# Patient Record
Sex: Male | Born: 1935
Health system: Southern US, Community
[De-identification: ages and names within clinical notes are randomized; demographics above are authoritative.]

## PROBLEM LIST (undated history)

## (undated) DIAGNOSIS — I340 Nonrheumatic mitral (valve) insufficiency: Secondary | ICD-10-CM

## (undated) DIAGNOSIS — K922 Gastrointestinal hemorrhage, unspecified: Secondary | ICD-10-CM

## (undated) DIAGNOSIS — Z5189 Encounter for other specified aftercare: Secondary | ICD-10-CM

## (undated) DIAGNOSIS — D649 Anemia, unspecified: Secondary | ICD-10-CM

## (undated) DIAGNOSIS — I255 Ischemic cardiomyopathy: Secondary | ICD-10-CM

## (undated) DIAGNOSIS — E785 Hyperlipidemia, unspecified: Secondary | ICD-10-CM

## (undated) DIAGNOSIS — N183 Chronic kidney disease, stage 3 unspecified: Secondary | ICD-10-CM

## (undated) DIAGNOSIS — F419 Anxiety disorder, unspecified: Secondary | ICD-10-CM

## (undated) DIAGNOSIS — C61 Malignant neoplasm of prostate: Secondary | ICD-10-CM

## (undated) DIAGNOSIS — IMO0001 Reserved for inherently not codable concepts without codable children: Secondary | ICD-10-CM

## (undated) DIAGNOSIS — K819 Cholecystitis, unspecified: Secondary | ICD-10-CM

## (undated) DIAGNOSIS — E119 Type 2 diabetes mellitus without complications: Secondary | ICD-10-CM

## (undated) DIAGNOSIS — I739 Peripheral vascular disease, unspecified: Secondary | ICD-10-CM

## (undated) DIAGNOSIS — I442 Atrioventricular block, complete: Secondary | ICD-10-CM

## (undated) DIAGNOSIS — I2510 Atherosclerotic heart disease of native coronary artery without angina pectoris: Secondary | ICD-10-CM

## (undated) HISTORY — DX: Ischemic cardiomyopathy: I25.5

## (undated) HISTORY — PX: LUMBAR DISC SURGERY: SHX700

## (undated) HISTORY — DX: Chronic kidney disease, stage 3 unspecified: N18.30

## (undated) HISTORY — DX: Chronic kidney disease, stage 3 (moderate): N18.3

## (undated) HISTORY — DX: Atrioventricular block, complete: I44.2

## (undated) HISTORY — PX: BACK SURGERY: SHX140

## (undated) HISTORY — DX: Nonrheumatic mitral (valve) insufficiency: I34.0

## (undated) HISTORY — DX: Cholecystitis, unspecified: K81.9

---
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
## 1998-12-28 ENCOUNTER — Other Ambulatory Visit: Admission: RE | Admit: 1998-12-28 | Discharge: 1998-12-28 | Payer: Self-pay | Admitting: Urology

## 2000-02-23 HISTORY — PX: INGUINAL HERNIA REPAIR: SUR1180

## 2000-03-15 ENCOUNTER — Encounter: Payer: Self-pay | Admitting: Surgery

## 2000-03-15 ENCOUNTER — Encounter: Admission: RE | Admit: 2000-03-15 | Discharge: 2000-03-15 | Payer: Self-pay | Admitting: Surgery

## 2000-03-18 ENCOUNTER — Ambulatory Visit (HOSPITAL_BASED_OUTPATIENT_CLINIC_OR_DEPARTMENT_OTHER): Admission: RE | Admit: 2000-03-18 | Discharge: 2000-03-18 | Payer: Self-pay | Admitting: Surgery

## 2003-08-23 ENCOUNTER — Inpatient Hospital Stay (HOSPITAL_COMMUNITY): Admission: AD | Admit: 2003-08-23 | Discharge: 2003-08-25 | Payer: Self-pay | Admitting: *Deleted

## 2003-08-25 HISTORY — PX: CORONARY ANGIOPLASTY WITH STENT PLACEMENT: SHX49

## 2003-09-13 ENCOUNTER — Encounter (HOSPITAL_COMMUNITY): Admission: RE | Admit: 2003-09-13 | Discharge: 2003-12-12 | Payer: Self-pay | Admitting: Cardiology

## 2003-12-13 ENCOUNTER — Encounter (HOSPITAL_COMMUNITY): Admission: RE | Admit: 2003-12-13 | Discharge: 2003-12-24 | Payer: Self-pay | Admitting: Cardiology

## 2009-11-08 ENCOUNTER — Ambulatory Visit: Admission: RE | Admit: 2009-11-08 | Discharge: 2010-01-23 | Payer: Self-pay | Admitting: Radiation Oncology

## 2010-02-01 ENCOUNTER — Ambulatory Visit: Admission: RE | Admit: 2010-02-01 | Discharge: 2010-04-26 | Payer: Self-pay | Admitting: Radiation Oncology

## 2010-12-24 HISTORY — PX: CORONARY ANGIOPLASTY WITH STENT PLACEMENT: SHX49

## 2011-01-21 ENCOUNTER — Inpatient Hospital Stay (HOSPITAL_COMMUNITY)
Admission: EM | Admit: 2011-01-21 | Discharge: 2011-01-29 | DRG: 237 | Disposition: A | Discharge status: Home / Self Care | Payer: Medicare Other | Source: Other Acute Inpatient Hospital | Attending: Interventional Cardiology | Admitting: Interventional Cardiology

## 2011-01-21 ENCOUNTER — Inpatient Hospital Stay (HOSPITAL_COMMUNITY): Payer: Medicare Other

## 2011-01-21 DIAGNOSIS — E785 Hyperlipidemia, unspecified: Secondary | ICD-10-CM | POA: Diagnosis present

## 2011-01-21 DIAGNOSIS — Z7902 Long term (current) use of antithrombotics/antiplatelets: Secondary | ICD-10-CM

## 2011-01-21 DIAGNOSIS — I5023 Acute on chronic systolic (congestive) heart failure: Secondary | ICD-10-CM | POA: Diagnosis present

## 2011-01-21 DIAGNOSIS — Z794 Long term (current) use of insulin: Secondary | ICD-10-CM

## 2011-01-21 DIAGNOSIS — R570 Cardiogenic shock: Secondary | ICD-10-CM | POA: Diagnosis present

## 2011-01-21 DIAGNOSIS — Z9861 Coronary angioplasty status: Secondary | ICD-10-CM

## 2011-01-21 DIAGNOSIS — I4892 Unspecified atrial flutter: Secondary | ICD-10-CM | POA: Diagnosis present

## 2011-01-21 DIAGNOSIS — N179 Acute kidney failure, unspecified: Secondary | ICD-10-CM | POA: Diagnosis not present

## 2011-01-21 DIAGNOSIS — I2129 ST elevation (STEMI) myocardial infarction involving other sites: Principal | ICD-10-CM | POA: Diagnosis present

## 2011-01-21 DIAGNOSIS — Z7982 Long term (current) use of aspirin: Secondary | ICD-10-CM

## 2011-01-21 DIAGNOSIS — E119 Type 2 diabetes mellitus without complications: Secondary | ICD-10-CM | POA: Diagnosis present

## 2011-01-21 DIAGNOSIS — I2510 Atherosclerotic heart disease of native coronary artery without angina pectoris: Secondary | ICD-10-CM | POA: Diagnosis present

## 2011-01-21 DIAGNOSIS — J9600 Acute respiratory failure, unspecified whether with hypoxia or hypercapnia: Secondary | ICD-10-CM | POA: Diagnosis present

## 2011-01-21 DIAGNOSIS — I6529 Occlusion and stenosis of unspecified carotid artery: Secondary | ICD-10-CM | POA: Diagnosis present

## 2011-01-21 DIAGNOSIS — I509 Heart failure, unspecified: Secondary | ICD-10-CM | POA: Diagnosis present

## 2011-01-21 LAB — TSH: TSH: 1.65 u[IU]/mL (ref 0.350–4.500)

## 2011-01-21 LAB — PROTIME-INR
INR: >10 (ref 0.00–1.49)
Prothrombin Time: >90 seconds — ABNORMAL HIGH (ref 11.6–15.2)

## 2011-01-21 LAB — COMPREHENSIVE METABOLIC PANEL
ALT: 140 U/L — ABNORMAL HIGH (ref 0–53)
Alkaline Phosphatase: 42 U/L (ref 39–117)
CO2: 10 mEq/L — CL (ref 19–32)
Chloride: 101 mEq/L (ref 96–112)
GFR calc non Af Amer: 35 mL/min — ABNORMAL LOW (ref >60)
Glucose, Bld: 514 mg/dL — ABNORMAL HIGH (ref 70–99)
Potassium: 4.6 mEq/L (ref 3.5–5.1)
Sodium: 126 mEq/L — ABNORMAL LOW (ref 135–145)
Total Bilirubin: 0.6 mg/dL (ref 0.3–1.2)

## 2011-01-21 LAB — CARDIAC PANEL(CRET KIN+CKTOT+MB+TROPI)
CK, MB: 340.3 ng/mL (ref 0.3–4.0)
Relative Index: 9.2 — ABNORMAL HIGH (ref 0.0–2.5)
Relative Index: 6.8 — ABNORMAL HIGH (ref 0.0–2.5)
Troponin I: 9.76 ng/mL (ref 0.00–0.06)

## 2011-01-21 LAB — CBC
HCT: 28.7 % — ABNORMAL LOW (ref 39.0–52.0)
HCT: 33.2 % — ABNORMAL LOW (ref 39.0–52.0)
Hemoglobin: 9.3 g/dL — ABNORMAL LOW (ref 13.0–17.0)
Hemoglobin: 11 g/dL — ABNORMAL LOW (ref 13.0–17.0)
MCH: 32.9 pg (ref 26.0–34.0)
MCHC: 32.4 g/dL (ref 30.0–36.0)
MCV: 101.4 fL — ABNORMAL HIGH (ref 78.0–100.0)
Platelets: 299 10*3/uL (ref 150–400)
RBC: 2.83 MIL/uL — ABNORMAL LOW (ref 4.22–5.81)
RDW: 14.6 % (ref 11.5–15.5)
WBC: 14.2 10*3/uL — ABNORMAL HIGH (ref 4.0–10.5)
WBC: 17.2 10*3/uL — ABNORMAL HIGH (ref 4.0–10.5)

## 2011-01-21 LAB — BASIC METABOLIC PANEL
BUN: 31 mg/dL — ABNORMAL HIGH (ref 6–23)
CO2: 25 mEq/L (ref 19–32)
CO2: 24 mEq/L (ref 19–32)
Calcium: 8.6 mg/dL (ref 8.4–10.5)
Chloride: 109 mEq/L (ref 96–112)
Chloride: 108 mEq/L (ref 96–112)
Creatinine, Ser: 2.06 mg/dL — ABNORMAL HIGH (ref 0.4–1.5)
GFR calc Af Amer: 36 mL/min — ABNORMAL LOW (ref >60)
Sodium: 141 mEq/L (ref 135–145)

## 2011-01-21 LAB — GLUCOSE, CAPILLARY
Glucose-Capillary: 391 mg/dL — ABNORMAL HIGH (ref 70–99)
Glucose-Capillary: 158 mg/dL — ABNORMAL HIGH (ref 70–99)

## 2011-01-21 LAB — APTT: aPTT: >200 seconds (ref 24–37)

## 2011-01-21 LAB — BRAIN NATRIURETIC PEPTIDE: Pro B Natriuretic peptide (BNP): 396 pg/mL — ABNORMAL HIGH (ref 0.0–100.0)

## 2011-01-21 LAB — POCT ACTIVATED CLOTTING TIME: Activated Clotting Time: 122 seconds

## 2011-01-21 LAB — HEPARIN LEVEL (UNFRACTIONATED): Heparin Unfractionated: 0.15 IU/mL — ABNORMAL LOW (ref 0.30–0.70)

## 2011-01-22 ENCOUNTER — Inpatient Hospital Stay (HOSPITAL_COMMUNITY): Payer: Medicare Other

## 2011-01-22 LAB — POCT I-STAT, CHEM 8
Creatinine, Ser: 2 mg/dL — ABNORMAL HIGH (ref 0.4–1.5)
Glucose, Bld: 516 mg/dL — ABNORMAL HIGH (ref 70–99)
Hemoglobin: 10.9 g/dL — ABNORMAL LOW (ref 13.0–17.0)
Potassium: 4.7 mEq/L (ref 3.5–5.1)
TCO2: 11 mmol/L (ref 0–100)

## 2011-01-22 LAB — CBC
HCT: 30.7 % — ABNORMAL LOW (ref 39.0–52.0)
MCH: 32.2 pg (ref 26.0–34.0)
MCH: 32.9 pg (ref 26.0–34.0)
MCHC: 33 g/dL (ref 30.0–36.0)
MCV: 99 fL (ref 78.0–100.0)
Platelets: 266 10*3/uL (ref 150–400)
Platelets: 325 10*3/uL (ref 150–400)
RBC: 3.1 MIL/uL — ABNORMAL LOW (ref 4.22–5.81)
RDW: 14.6 % (ref 11.5–15.5)

## 2011-01-22 LAB — BASIC METABOLIC PANEL
BUN: 32 mg/dL — ABNORMAL HIGH (ref 6–23)
BUN: 33 mg/dL — ABNORMAL HIGH (ref 6–23)
CO2: 25 mEq/L (ref 19–32)
Calcium: 8.5 mg/dL (ref 8.4–10.5)
Chloride: 109 mEq/L (ref 96–112)
Creatinine, Ser: 2.05 mg/dL — ABNORMAL HIGH (ref 0.4–1.5)
Creatinine, Ser: 2.21 mg/dL — ABNORMAL HIGH (ref 0.4–1.5)
Glucose, Bld: 156 mg/dL — ABNORMAL HIGH (ref 70–99)
Glucose, Bld: 189 mg/dL — ABNORMAL HIGH (ref 70–99)

## 2011-01-22 LAB — GLUCOSE, CAPILLARY
Glucose-Capillary: 137 mg/dL — ABNORMAL HIGH (ref 70–99)
Glucose-Capillary: 206 mg/dL — ABNORMAL HIGH (ref 70–99)
Glucose-Capillary: 161 mg/dL — ABNORMAL HIGH (ref 70–99)

## 2011-01-22 LAB — CARDIAC PANEL(CRET KIN+CKTOT+MB+TROPI): Relative Index: 4.7 — ABNORMAL HIGH (ref 0.0–2.5)

## 2011-01-23 LAB — BASIC METABOLIC PANEL
BUN: 46 mg/dL — ABNORMAL HIGH (ref 6–23)
CO2: 25 mEq/L (ref 19–32)
CO2: 24 mEq/L (ref 19–32)
Calcium: 8.7 mg/dL (ref 8.4–10.5)
Calcium: 8.7 mg/dL (ref 8.4–10.5)
Chloride: 106 mEq/L (ref 96–112)
Chloride: 102 mEq/L (ref 96–112)
Creatinine, Ser: 2.32 mg/dL — ABNORMAL HIGH (ref 0.4–1.5)
GFR calc Af Amer: 29 mL/min — ABNORMAL LOW (ref >60)
GFR calc Af Amer: 33 mL/min — ABNORMAL LOW (ref >60)
GFR calc non Af Amer: 27 mL/min — ABNORMAL LOW (ref >60)
Glucose, Bld: 215 mg/dL — ABNORMAL HIGH (ref 70–99)
Potassium: 4.2 mEq/L (ref 3.5–5.1)
Sodium: 140 mEq/L (ref 135–145)
Sodium: 137 mEq/L (ref 135–145)

## 2011-01-23 LAB — URINALYSIS, MICROSCOPIC ONLY
Bilirubin Urine: NEGATIVE
Leukocytes, UA: NEGATIVE
Nitrite: NEGATIVE
Specific Gravity, Urine: 1.023 (ref 1.005–1.030)
Urobilinogen, UA: 0.2 mg/dL (ref 0.0–1.0)
pH: 5 (ref 5.0–8.0)

## 2011-01-23 LAB — GLUCOSE, CAPILLARY
Glucose-Capillary: 201 mg/dL — ABNORMAL HIGH (ref 70–99)
Glucose-Capillary: 274 mg/dL — ABNORMAL HIGH (ref 70–99)
Glucose-Capillary: 192 mg/dL — ABNORMAL HIGH (ref 70–99)
Glucose-Capillary: 248 mg/dL — ABNORMAL HIGH (ref 70–99)

## 2011-01-23 LAB — CBC
Hemoglobin: 10 g/dL — ABNORMAL LOW (ref 13.0–17.0)
Platelets: 275 10*3/uL (ref 150–400)
RBC: 3.01 MIL/uL — ABNORMAL LOW (ref 4.22–5.81)

## 2011-01-23 LAB — CARDIAC PANEL(CRET KIN+CKTOT+MB+TROPI)
CK, MB: 29.9 ng/mL (ref 0.3–4.0)
Total CK: 770 U/L — ABNORMAL HIGH (ref 7–232)
Troponin I: >100 ng/mL (ref 0.00–0.06)

## 2011-01-23 LAB — URINE CULTURE
Colony Count: NO GROWTH
Culture: NO GROWTH

## 2011-01-24 ENCOUNTER — Inpatient Hospital Stay (HOSPITAL_COMMUNITY): Payer: Medicare Other

## 2011-01-24 LAB — CBC
HCT: 30.1 % — ABNORMAL LOW (ref 39.0–52.0)
MCHC: 33.6 g/dL (ref 30.0–36.0)
MCV: 98 fL (ref 78.0–100.0)
Platelets: 280 10*3/uL (ref 150–400)
RDW: 14.7 % (ref 11.5–15.5)
WBC: 12.8 10*3/uL — ABNORMAL HIGH (ref 4.0–10.5)

## 2011-01-24 LAB — BASIC METABOLIC PANEL
BUN: 45 mg/dL — ABNORMAL HIGH (ref 6–23)
Calcium: 9 mg/dL (ref 8.4–10.5)
GFR calc non Af Amer: 29 mL/min — ABNORMAL LOW (ref >60)
Glucose, Bld: 126 mg/dL — ABNORMAL HIGH (ref 70–99)
Potassium: 4.6 mEq/L (ref 3.5–5.1)

## 2011-01-24 LAB — GLUCOSE, CAPILLARY
Glucose-Capillary: 137 mg/dL — ABNORMAL HIGH (ref 70–99)
Glucose-Capillary: 173 mg/dL — ABNORMAL HIGH (ref 70–99)

## 2011-01-25 DIAGNOSIS — I2510 Atherosclerotic heart disease of native coronary artery without angina pectoris: Secondary | ICD-10-CM

## 2011-01-25 LAB — CBC
HCT: 31 % — ABNORMAL LOW (ref 39.0–52.0)
Hemoglobin: 10.5 g/dL — ABNORMAL LOW (ref 13.0–17.0)
RBC: 3.17 MIL/uL — ABNORMAL LOW (ref 4.22–5.81)
WBC: 8.9 10*3/uL (ref 4.0–10.5)

## 2011-01-25 LAB — GLUCOSE, CAPILLARY
Glucose-Capillary: 168 mg/dL — ABNORMAL HIGH (ref 70–99)
Glucose-Capillary: 131 mg/dL — ABNORMAL HIGH (ref 70–99)
Glucose-Capillary: 250 mg/dL — ABNORMAL HIGH (ref 70–99)
Glucose-Capillary: 142 mg/dL — ABNORMAL HIGH (ref 70–99)
Glucose-Capillary: 94 mg/dL (ref 70–99)

## 2011-01-25 LAB — BASIC METABOLIC PANEL
CO2: 28 mEq/L (ref 19–32)
Chloride: 98 mEq/L (ref 96–112)
Creatinine, Ser: 1.97 mg/dL — ABNORMAL HIGH (ref 0.4–1.5)
GFR calc Af Amer: 40 mL/min — ABNORMAL LOW (ref >60)
Potassium: 3.3 mEq/L — ABNORMAL LOW (ref 3.5–5.1)
Sodium: 138 mEq/L (ref 135–145)

## 2011-01-25 LAB — PRO B NATRIURETIC PEPTIDE: Pro B Natriuretic peptide (BNP): 14972 pg/mL — ABNORMAL HIGH (ref 0–450)

## 2011-01-26 ENCOUNTER — Inpatient Hospital Stay (HOSPITAL_COMMUNITY): Payer: Medicare Other

## 2011-01-26 DIAGNOSIS — I2510 Atherosclerotic heart disease of native coronary artery without angina pectoris: Secondary | ICD-10-CM

## 2011-01-26 LAB — GLUCOSE, CAPILLARY
Glucose-Capillary: 141 mg/dL — ABNORMAL HIGH (ref 70–99)
Glucose-Capillary: 175 mg/dL — ABNORMAL HIGH (ref 70–99)

## 2011-01-26 LAB — BASIC METABOLIC PANEL
Calcium: 9.1 mg/dL (ref 8.4–10.5)
Chloride: 100 mEq/L (ref 96–112)
Creatinine, Ser: 2 mg/dL — ABNORMAL HIGH (ref 0.4–1.5)
GFR calc Af Amer: 39 mL/min — ABNORMAL LOW (ref >60)
Sodium: 137 mEq/L (ref 135–145)

## 2011-01-26 LAB — CBC
Hemoglobin: 9.9 g/dL — ABNORMAL LOW (ref 13.0–17.0)
MCHC: 34.3 g/dL (ref 30.0–36.0)
RDW: 14 % (ref 11.5–15.5)
WBC: 7.5 10*3/uL (ref 4.0–10.5)

## 2011-01-26 LAB — HEPARIN LEVEL (UNFRACTIONATED): Heparin Unfractionated: 0.37 IU/mL (ref 0.30–0.70)

## 2011-01-27 LAB — BASIC METABOLIC PANEL
CO2: 28 mEq/L (ref 19–32)
Calcium: 9.4 mg/dL (ref 8.4–10.5)
Creatinine, Ser: 1.99 mg/dL — ABNORMAL HIGH (ref 0.4–1.5)
GFR calc Af Amer: 39 mL/min — ABNORMAL LOW (ref >60)
GFR calc non Af Amer: 32 mL/min — ABNORMAL LOW (ref >60)
Glucose, Bld: 154 mg/dL — ABNORMAL HIGH (ref 70–99)
Sodium: 139 mEq/L (ref 135–145)

## 2011-01-27 LAB — GLUCOSE, CAPILLARY
Glucose-Capillary: 158 mg/dL — ABNORMAL HIGH (ref 70–99)
Glucose-Capillary: 232 mg/dL — ABNORMAL HIGH (ref 70–99)
Glucose-Capillary: 258 mg/dL — ABNORMAL HIGH (ref 70–99)
Glucose-Capillary: 133 mg/dL — ABNORMAL HIGH (ref 70–99)

## 2011-01-27 LAB — CBC
HCT: 29.6 % — ABNORMAL LOW (ref 39.0–52.0)
Hemoglobin: 9.9 g/dL — ABNORMAL LOW (ref 13.0–17.0)
MCH: 32.2 pg (ref 26.0–34.0)
MCV: 96.4 fL (ref 78.0–100.0)
RBC: 3.07 MIL/uL — ABNORMAL LOW (ref 4.22–5.81)

## 2011-01-27 LAB — PRO B NATRIURETIC PEPTIDE: Pro B Natriuretic peptide (BNP): 10851 pg/mL — ABNORMAL HIGH (ref 0–450)

## 2011-01-28 LAB — BASIC METABOLIC PANEL
BUN: 35 mg/dL — ABNORMAL HIGH (ref 6–23)
Calcium: 9.5 mg/dL (ref 8.4–10.5)
Creatinine, Ser: 2.03 mg/dL — ABNORMAL HIGH (ref 0.4–1.5)
GFR calc non Af Amer: 32 mL/min — ABNORMAL LOW (ref >60)
Glucose, Bld: 208 mg/dL — ABNORMAL HIGH (ref 70–99)
Potassium: 4.6 mEq/L (ref 3.5–5.1)

## 2011-01-28 LAB — GLUCOSE, CAPILLARY
Glucose-Capillary: 200 mg/dL — ABNORMAL HIGH (ref 70–99)
Glucose-Capillary: 450 mg/dL — ABNORMAL HIGH (ref 70–99)
Glucose-Capillary: 433 mg/dL — ABNORMAL HIGH (ref 70–99)
Glucose-Capillary: 257 mg/dL — ABNORMAL HIGH (ref 70–99)
Glucose-Capillary: 133 mg/dL — ABNORMAL HIGH (ref 70–99)

## 2011-01-28 LAB — CULTURE, BLOOD (ROUTINE X 2)
Culture  Setup Time: 201204302121
Culture: NO GROWTH

## 2011-01-28 LAB — GLUCOSE, RANDOM: Glucose, Bld: 438 mg/dL — ABNORMAL HIGH (ref 70–99)

## 2011-01-29 LAB — BASIC METABOLIC PANEL
BUN: 35 mg/dL — ABNORMAL HIGH (ref 6–23)
Calcium: 9.4 mg/dL (ref 8.4–10.5)
Creatinine, Ser: 2.17 mg/dL — ABNORMAL HIGH (ref 0.4–1.5)
GFR calc non Af Amer: 29 mL/min — ABNORMAL LOW (ref >60)
Glucose, Bld: 185 mg/dL — ABNORMAL HIGH (ref 70–99)
Potassium: 4.5 mEq/L (ref 3.5–5.1)

## 2011-01-29 LAB — GLUCOSE, CAPILLARY: Glucose-Capillary: 182 mg/dL — ABNORMAL HIGH (ref 70–99)

## 2011-01-29 NOTE — Consult Note (Signed)
NAME:  Robert [name], Robert [name] NO.:  [id number]  MEDICAL RECORD NO.:  [id number]           PATIENT TYPE:  I  LOCATION:  2912                         FACILITY:  [facility]  PHYSICIAN:  Gilford Raid, M.D.     DATE OF BIRTH:  [birth year]  DATE OF CONSULTATION:  01/25/2011 DATE OF DISCHARGE:                                CONSULTATION   REFERRING PHYSICIAN:  Jettie Booze, MD  REASON FOR CONSULTATION:  Status post posterolateral myocardial infarction with severe mitral regurgitation, cardiogenic shock, and congestive heart failure.  CLINICAL HISTORY:  I was asked by Dr. [name] to evaluate Mr. Levar for consideration of surgical repair of his severe mitral regurgitation and residual coronary artery disease.  He is an 77 year old white male with diabetes and history of coronary artery disease status post myocardial infarction and drug-eluting stent in the right coronary artery in 2006.  He is on Plavix for about 1 year after that procedure. He did well until the last few months when he has had significant exertional dyspnea and fatigue.  Then, the evening of January 21, 2011, he suddenly felt "sick" with back pain, shortness of breath, and weakness. His wife called EMS and on presentation to the emergency room he had ischemic electrocardiogram changes suggesting a posterior infarction with ST-segment depression in V1-V3 and minimal inferior ST elevation. He was taken to cath lab as a code STEMI.  This showed the culprit to be an occluded mid left circumflex coronary artery before any marginal branches.  This was opened and a drug-eluting stent was placed with a good result and TIMI 3 flow.  The patient had some mild left main narrowing that is probably 50% at the ostium and mid segment.  The LAD was diffusely diseased but widely patent.  There was a small first diagonal that had about 80-90% proximal stenosis.  The left circumflex was as mentioned above.  The right  coronary artery stent was patent. There was a proximal 80% stenosis before the stent.  The patient did have cardiogenic shock during this procedure and intraaortic balloon pump and dopamine were used.  His ejection fraction was noted to be about 35% by cath.  Over the next couple of days, he remained hemodynamically unstable requiring pressor support.  He had hypoxemia with increased oxygen requirement.  His creatinine was elevated greater than 2.  He had some wide complex tachycardia.  His peak CPK was around 8000 with MB of about 800 and troponin greater than 100.  Over the past 24-48 hours, he has been improving with a decreased oxygen requirement and decreased creatinine.  He has been hemodynamically stable on 3 mcg/kg/minute of dopamine without the intra-aortic balloon pump.  A repeat echocardiogram was done today which showed improvement in his ejection fraction to about 55% compared to echocardiogram on January 22, 2011, which showed ejection fraction about 35-40%.  There was severe mitral regurgitation noted on echocardiogram today and this was really unchanged from his echocardiogram on January 22, 2011.  REVIEW OF SYSTEMS:  GENERAL:  He denies any fever or chills.  He has had no recent weight changes.  He has had significant exertional fatigue as noted above.  EYES:  Negative.  ENT:  Negative.  ENDOCRINE:  He does have adult-onset diabetes.  Denies hypothyroidism.  CARDIOVASCULAR:  He denies any chest pain or pressure.  He did have back pain with this acute MI.  He reports exertional dyspnea.  He did have orthopnea and PND at the time of his myocardial infarction.  He denies peripheral edema and palpitations.  RESPIRATORY:  He has had no cough or sputum production.  GI:  He denies nausea or vomiting.  Denies melena and bright red blood per rectum.  GU:  He denies dysuria and hematuria. MUSCULOSKELETAL:  He denies arthralgias.  He has had some pain in his back muscles.   NEUROLOGICAL:  He denies any focal weakness or numbness. Denies dizziness and syncope.  He has never had a TIA or stroke. ALLERGIES:  None.  PSYCHIATRIC:  Negative.  HEMATOLOGICAL:  Negative.  PAST MEDICAL HISTORY:  Significant for adult-onset diabetes.  He has a history of coronary artery disease as mentioned above.  His only previous surgery is of bilateral inguinal herniorrhaphy in the past.  PRESENT MEDICATIONS: 1. Crestor 40 mg daily. 2. Plavix 75 mg daily. 3. Aspirin 325 mg daily. 4. Insulin sliding scale. 5. Amiodarone 200 mg b.i.d. 6. Lovenox 30 mg daily. 7. Protonix 40 mg daily. 8. Morphine p.r.n. 9. Percocet p.r.n. 10.Tylenol p.r.n.  SOCIAL HISTORY:  He is retired and lives with his wife who is here with him today.  He is a nonsmoker.  Denies alcohol abuse.  FAMILY HISTORY:  Positive for early coronary artery disease.  PHYSICAL EXAMINATION:  VITAL SIGNS:  His blood pressure at the present time is 96/41 with a pulse of 85 in sinus rhythm.  His oxygen saturation on 4 liters nasal cannula is 100%.  He is on dopamine at 3 mcg/kg/minute. GENERAL:  He is awake and alert.  He is a well-developed elderly gentleman in no acute distress.HEENT:  Normocephalic and atraumatic.  Pupils are equal and reactive to light and accommodation.  Extraocular muscles are intact.  His oropharynx is clear. NECK:  Normal carotid pulses bilaterally.  There are no bruits.  There is no adenopathy or thyromegaly. CARDIAC:  Regular rate and rhythm with a grade 1/6 systolic murmur at the apex. LUNGS:  Slight decreased breath sounds at the bases but are otherwise clear. ABDOMEN:  Active bowel sounds.  His abdomen is soft and nontender. There are no palpable masses or organomegaly. EXTREMITY:  No peripheral edema.  Pedal pulses are diminished but palpable bilaterally. SKIN:  Warm and dry. NEUROLOGIC:  Alert and oriented x3.  Motor and sensory exams are grossly normal.  LABORATORY EXAMINATION:   Today shows a BUN of 36 and creatinine of 1.97. His creatinine at the time of presentation was 2.0.  His white blood cell count is 8.9, hemoglobin 10.5, hematocrit 31.  Chest x-ray from Jan 24, 2011, shows small bilateral effusions and congestive heart failure.  IMPRESSION:  Mr. Hellenbrand has had a large posterolateral infarct successfully treated with stenting of the mid left circumflex coronary artery.  He has some residual but noncritical coronary artery disease and severe mitral regurgitation that still persists on echocardiogram today and is likely related to papillary muscle dysfunction from his infarction.  He has had hemodynamic instability and hypoxemia both of which have improved over the past 24-48 hours.  I think that surgical repair of his mitral regurgitation with valve repair or replacement as well as coronary artery  bypass surgery would be a high risk procedure given his age and the fact that he has had a large acute myocardial infarction and is on Plavix with a drug-eluting stent in place.  His Plavix would need to be discontinued perioperatively.  Since he seems to be improving, I would recommend continued medical therapy in a short- term.  If he does not continues to improve or worsens, then I would recommend proceeding ahead with surgery.  I will discuss the case further with Dr. [name].  I did discuss my recommendations with the patient and his family.  I told them that I would continue to follow him closely to help in the decision-making.     Gilford Raid, M.D.     BB/MEDQ  D:  01/25/2011  T:  01/26/2011  Job:  [id number]  cc:   Jettie Booze, MD  Electronically Signed by Gilford Raid M.D. on 01/29/2011 11:27:16 AM

## 2011-01-30 ENCOUNTER — Inpatient Hospital Stay (HOSPITAL_COMMUNITY)
Admission: EM | Admit: 2011-01-30 | Discharge: 2011-02-02 | DRG: 639 | Disposition: A | Discharge status: Home / Self Care | Payer: Medicare Other | Attending: Interventional Cardiology | Admitting: Interventional Cardiology

## 2011-01-30 ENCOUNTER — Emergency Department (HOSPITAL_COMMUNITY): Payer: Medicare Other

## 2011-01-30 DIAGNOSIS — I2510 Atherosclerotic heart disease of native coronary artery without angina pectoris: Secondary | ICD-10-CM | POA: Diagnosis present

## 2011-01-30 DIAGNOSIS — I509 Heart failure, unspecified: Secondary | ICD-10-CM | POA: Diagnosis present

## 2011-01-30 DIAGNOSIS — Z9861 Coronary angioplasty status: Secondary | ICD-10-CM

## 2011-01-30 DIAGNOSIS — N189 Chronic kidney disease, unspecified: Secondary | ICD-10-CM | POA: Diagnosis present

## 2011-01-30 DIAGNOSIS — I2589 Other forms of chronic ischemic heart disease: Secondary | ICD-10-CM | POA: Diagnosis present

## 2011-01-30 DIAGNOSIS — E785 Hyperlipidemia, unspecified: Secondary | ICD-10-CM | POA: Diagnosis present

## 2011-01-30 DIAGNOSIS — Z7982 Long term (current) use of aspirin: Secondary | ICD-10-CM

## 2011-01-30 DIAGNOSIS — E1169 Type 2 diabetes mellitus with other specified complication: Principal | ICD-10-CM | POA: Diagnosis present

## 2011-01-30 DIAGNOSIS — Z7902 Long term (current) use of antithrombotics/antiplatelets: Secondary | ICD-10-CM

## 2011-01-30 DIAGNOSIS — R0602 Shortness of breath: Secondary | ICD-10-CM

## 2011-01-30 DIAGNOSIS — I129 Hypertensive chronic kidney disease with stage 1 through stage 4 chronic kidney disease, or unspecified chronic kidney disease: Secondary | ICD-10-CM | POA: Diagnosis present

## 2011-01-30 DIAGNOSIS — I059 Rheumatic mitral valve disease, unspecified: Secondary | ICD-10-CM | POA: Diagnosis present

## 2011-01-30 DIAGNOSIS — N179 Acute kidney failure, unspecified: Secondary | ICD-10-CM | POA: Diagnosis present

## 2011-01-30 LAB — POCT I-STAT 3, ART BLOOD GAS (G3+)
Bicarbonate: 24.1 mEq/L — ABNORMAL HIGH (ref 20.0–24.0)
Patient temperature: 98.6
pCO2 arterial: 37.6 mmHg (ref 35.0–45.0)
pO2, Arterial: 60 mmHg — ABNORMAL LOW (ref 80.0–100.0)

## 2011-01-30 LAB — CBC
HCT: 32.2 % — ABNORMAL LOW (ref 39.0–52.0)
Hemoglobin: 10.7 g/dL — ABNORMAL LOW (ref 13.0–17.0)
MCH: 32.6 pg (ref 26.0–34.0)
MCHC: 33.2 g/dL (ref 30.0–36.0)
MCV: 98.2 fL (ref 78.0–100.0)
RBC: 3.28 MIL/uL — ABNORMAL LOW (ref 4.22–5.81)
RDW: 14.4 % (ref 11.5–15.5)
WBC: 10.2 10*3/uL (ref 4.0–10.5)

## 2011-01-30 LAB — COMPREHENSIVE METABOLIC PANEL
ALT: 99 U/L — ABNORMAL HIGH (ref 0–53)
AST: 32 U/L (ref 0–37)
CO2: 23 mEq/L (ref 19–32)
Calcium: 9.5 mg/dL (ref 8.4–10.5)
Creatinine, Ser: 2.41 mg/dL — ABNORMAL HIGH (ref 0.4–1.5)
GFR calc Af Amer: 32 mL/min — ABNORMAL LOW (ref >60)
GFR calc non Af Amer: 26 mL/min — ABNORMAL LOW (ref >60)
Glucose, Bld: 644 mg/dL (ref 70–99)
Sodium: 131 mEq/L — ABNORMAL LOW (ref 135–145)
Total Protein: 6.9 g/dL (ref 6.0–8.3)

## 2011-01-30 LAB — DIFFERENTIAL
Eosinophils Absolute: 0.2 10*3/uL (ref 0.0–0.7)
Lymphocytes Relative: 14 % (ref 12–46)
Lymphs Abs: 1.4 10*3/uL (ref 0.7–4.0)
Neutro Abs: 7.5 10*3/uL (ref 1.7–7.7)
Neutrophils Relative %: 74 % (ref 43–77)

## 2011-01-30 LAB — POCT I-STAT, CHEM 8
BUN: 41 mg/dL — ABNORMAL HIGH (ref 6–23)
Calcium, Ion: 0.93 mmol/L — ABNORMAL LOW (ref 1.12–1.32)
Hemoglobin: 11.6 g/dL — ABNORMAL LOW (ref 13.0–17.0)
Sodium: 129 mEq/L — ABNORMAL LOW (ref 135–145)
TCO2: 20 mmol/L (ref 0–100)

## 2011-01-30 LAB — POCT CARDIAC MARKERS: Troponin i, poc: 18 ng/mL (ref 0.00–0.09)

## 2011-01-30 LAB — PRO B NATRIURETIC PEPTIDE: Pro B Natriuretic peptide (BNP): 9960 pg/mL — ABNORMAL HIGH (ref 0–450)

## 2011-01-31 LAB — GLUCOSE, CAPILLARY
Glucose-Capillary: 109 mg/dL — ABNORMAL HIGH (ref 70–99)
Glucose-Capillary: 117 mg/dL — ABNORMAL HIGH (ref 70–99)
Glucose-Capillary: 222 mg/dL — ABNORMAL HIGH (ref 70–99)
Glucose-Capillary: 263 mg/dL — ABNORMAL HIGH (ref 70–99)
Glucose-Capillary: 506 mg/dL — ABNORMAL HIGH (ref 70–99)
Glucose-Capillary: 88 mg/dL (ref 70–99)

## 2011-01-31 LAB — HEMOGLOBIN A1C
Hgb A1c MFr Bld: 8.2 % — ABNORMAL HIGH (ref <5.7)
Mean Plasma Glucose: 189 mg/dL — ABNORMAL HIGH (ref <117)

## 2011-02-01 LAB — CBC
Hemoglobin: 9.9 g/dL — ABNORMAL LOW (ref 13.0–17.0)
MCH: 32.4 pg (ref 26.0–34.0)
MCHC: 33.6 g/dL (ref 30.0–36.0)
RDW: 14.5 % (ref 11.5–15.5)

## 2011-02-01 LAB — GLUCOSE, CAPILLARY
Glucose-Capillary: 134 mg/dL — ABNORMAL HIGH (ref 70–99)
Glucose-Capillary: 161 mg/dL — ABNORMAL HIGH (ref 70–99)
Glucose-Capillary: 175 mg/dL — ABNORMAL HIGH (ref 70–99)
Glucose-Capillary: 195 mg/dL — ABNORMAL HIGH (ref 70–99)
Glucose-Capillary: 253 mg/dL — ABNORMAL HIGH (ref 70–99)

## 2011-02-01 LAB — COMPREHENSIVE METABOLIC PANEL
AST: 29 U/L (ref 0–37)
CO2: 28 mEq/L (ref 19–32)
Calcium: 9.5 mg/dL (ref 8.4–10.5)
Creatinine, Ser: 2.2 mg/dL — ABNORMAL HIGH (ref 0.4–1.5)
GFR calc Af Amer: 35 mL/min — ABNORMAL LOW (ref >60)
GFR calc non Af Amer: 29 mL/min — ABNORMAL LOW (ref >60)
Sodium: 139 mEq/L (ref 135–145)
Total Protein: 6 g/dL (ref 6.0–8.3)

## 2011-02-01 NOTE — Cardiovascular Report (Signed)
NAME:  Robert [name], Robert [name] NO.:  [id number]  MEDICAL RECORD NO.:  [id number]           PATIENT TYPE:  I  LOCATION:  2807                         FACILITY:  [facility]  PHYSICIAN:  Belva Crome, M.D.   DATE OF BIRTH:  07/10/1928  DATE OF PROCEDURE:  01/21/2011 DATE OF DISCHARGE:                           CARDIAC CATHETERIZATION   INDICATIONS FOR PROCEDURE:  Acute posterior wall ST elevation myocardial infarction and cardiogenic shock.  PROCEDURES PERFORMED: 1. Left heart cath. 2. Coronary angiography. 3. Left ventriculography. 4. Intra-aortic balloon pump. 5. Drug-eluting stent mid circumflex.  DESCRIPTION:  The patient was brought to the cath lab by EMS from the field.  His history briefly is that he started feeling sick at 6 p.m. He arrived in the cath lab at around 11 a.m.  He was ashen with model skin.  He complains of back discomfort.  His EKG revealed precordial ST depression V1 through V4 with minimal inferior ST elevation.  He appeared critically ill and skin was cold and dry.  Using emergency consent, we performed coronary angiography via the right femoral artery.  A 6-French sheath was inserted after Xylocaine local anesthesia.  A 6-French A2 multipurpose catheter was used to identify the coronary culprit.  Left ventriculography was also performed with this catheter.  We then used a CLS 3.56 [place] left coronary system guide catheter.  He was given 600 mg of Plavix and a bolus at renal doses of angio max followed by an infusion.  ACT was greater than 200. We then performed angioplasty using a 12-mm long 3.0 balloon on the midcircumflex.  This was done over a BMW wire.  We reestablished flow and he had persistent decrease in his blood pressure with sluggish coronary flow.  Because of this, the left groin was prepped and the intra-aortic balloon pump was inserted.  This improved his clinical condition.  Dopamine had been started prior to the balloon pump  to help support blood pressure.  We then stented the circumflex using a PROMUS element stent.  A 3.0 x 16-mm long stent to 13 atmospheres.  We postdilated with a 3.5 x 12-mm long [place] balloon.  Immediately upon opening the circumflex, the patient's back discomfort resolved.  A total of 110 mL of contrast was used.  We contemplated this intervening on the right coronary, but under the circumstances, we decided to not give further contrast and hopefully allow the patient an opportunity to recover renal function.  We noted on the emergency I-stat that his creatinine was 2.0.  Hemoglobin was 10.  The patient has improved condition on the completion of the case, but requiring a nonrebreathing mask for adequate oxygenation.  RESULTS: 1. Hemodynamic data:     a.     Aortic pressure 69/41.     b.     Left ventricular pressure 73/27. 2. Left ventriculography:  The left ventricle demonstrates decrease in     contractility of the mid anterior wall and inferobasal region.  EF,     however, overall appears to be in the 35-40% range.  No obvious     mitral regurgitation is  noted. 3. Coronary angiography.     a.     Left main coronary:  Heavily calcified.  The left main      contains up to 50% stenosis in the proximal and mid segment.     b.     Left anterior descending coronary:  This vessel was      diffusely diseased, but patent and transapical.  The vessel is      diffusely constricted due to cardiogenic shock.  The first      diagonal contains focal mid greater than 80% obstruction.     c.     Circumflex artery:  Totally occluded in the mid segment      before any branches.     d.     Right coronary:  The right coronary is patent.  There is      proximal 80% stenosis.  The distal bed is relatively small.  PDA      does arise from this vessel.  The previously placed mid stents      were patent.  The proximal disease is new. 4. PCI:  The circumflex 100% stenosis is reduced to 0% with TIMI grade      3 flow.  After recanalization, the circumflex is noted to be a     large dominant vessel giving origin to a large bifurcating first     obtuse marginal and multiple distal obtuse marginal branches and     perhaps even a twin PDA.  TIMI grade 3 flow was noted.  CONCLUSION: 1. Cardiogenic shock secondary to acute occlusion of the mid     circumflex which in this patient is a very large vessel supplying a     vast territory. 2. Successful drug-eluting stent implantation in the mid circumflex     from 100% to 0% with TIMI grade 3 flow. 3. Residual 80% proximal RCA stenosis.  This lesion may not be quite     as severe when restudied as the patient was severely     vasoconstrictive during this procedure. 4. Moderate reduction in LV function. 5. Moderate diffuse disease in the LAD and high-grade disease in the     diagonal.  PLAN: 1. Intra-aortic balloon pumping for 12-24 hours.  IV fluids to enhance     renal contrast excretion. 2. Angio max will be discontinued and we will convert to IV heparin. 3. Further management will depend on the patient's clinical course.     His prognosis is guarded given his comorbidities including renal     failure, anemia, diabetes, and age.     Belva Crome, M.D.     HWS/MEDQ  D:  01/21/2011  T:  01/21/2011  Job:  [id number]  cc:   Jettie Booze, MD  Electronically Signed by Daneen Schick M.D. on 02/01/2011 12:26:59 PM

## 2011-02-01 NOTE — H&P (Signed)
NAME:  Robert [name], Robert [name] NO.:  [id number]  MEDICAL RECORD NO.:  [id number]           PATIENT TYPE:  I  LOCATION:  2807                         FACILITY:  [facility]  PHYSICIAN:  Belva Crome, M.D.   DATE OF BIRTH:  Jun 26, 1928  DATE OF ADMISSION:  01/21/2011 DATE OF DISCHARGE:                             HISTORY & PHYSICAL   The patient is an 77 year old gentleman who previously cared for by Dr. Rodell Perna.  He has a prior history of RCA drug-eluting stent in 2004. He had subsequently been cared for by Dr. Lendell Caprice.  His primary physician is Dr. [name].  At approximately 6 p.m., this evening he began feeling "sick."  This persisted until bedtime.  When he tried to lay down, he was unable to because of increased discomfort in his back and dyspnea.  His wife called EMS.  On the initial EKG, there were ischemic changes suggesting a posterior infarction with ST-segment depression in V1 through V3 with minimal inferior ST elevation.  STEMI protocol was initiated.  He was brought straight to the cardiac catheterization laboratory at 1:11 a.m.  He was not complaining of chest pain, but rather back pain and "feeling sick."  The patient's blood pressure was low.  His skin was mottled.  He was somewhat agitated.  PAST MEDICAL HISTORY: 1. Coronary atherosclerotic heart disease with drug-eluting stent at     right coronary with other moderate coronary artery disease noted at     the time of cath. 2. Diabetes mellitus. 3. Carotid artery disease with 40-60% right carotid.  ALLERGIES:  None.  PAST SURGERIES:  Bilateral inguinal herniorrhaphy.  REVIEW OF SYSTEMS:  According to the family, the patient is relatively active.  He was planning on going to [employer] for a gambling trip later today.  He does not smoke.  He does not drink.  MEDICATIONS: 1. Metformin 1000 mg b.i.d. 2. Metoprolol 25 mg b.i.d. 3. Glipizide 10 mg b.i.d. 4. Lisinopril/HCT 10/12.5 mg daily. 5.  Pravastatin 80 mg per day. 6. Ocuvite daily. 7. Enteric-coated aspirin one per day.  FAMILY HISTORY:  Positive for early coronary artery disease.  SOCIAL HISTORY:  The patient is married and retired.  OBJECTIVE:  VITAL SIGNS:  Blood pressure by cuff 80/50, heart rate 60. Color is pale. SKIN:  Somewhat cyanotic and mottled.  Skin is cool. CARDIAC:  No murmur. CARDIAC:  Heart sounds are distant. CHEST:  Bibasilar rales are noted.  No wheezing is noted. NECK:  Significant JVD. EXTREMITIES:  Mottled skin.  Femoral pulses are 2+ bilaterally. Posterior tibial pulses are 1-2+. ABDOMEN:  Soft.  No tenderness.  No bruits. NEURO:  He is awake and is somewhat agitated and having a difficult time concentrating.  EKG demonstrates ST-segment depression in V1 through V3 with 1-mm of ST elevation in II, III and aVF.  ASSESSMENT: 1. Cardiogenic shock most likely due to a large inferior wall and     posterior infarction. 2. Diabetes mellitus.  PLAN:  Emergency catheterization with appropriate management based upon the patient's anatomy and hemodynamics.  He may require intra-aortic balloon pumping.  Belva Crome, M.D.     HWS/MEDQ  D:  01/21/2011  T:  01/21/2011  Job:  [id number]  cc:   Jettie Booze, MD  Electronically Signed by Daneen Schick M.D. on 02/01/2011 12:27:02 PM

## 2011-02-02 LAB — CBC
Platelets: 397 10*3/uL (ref 150–400)
RDW: 14.7 % (ref 11.5–15.5)
WBC: 9.3 10*3/uL (ref 4.0–10.5)

## 2011-02-02 LAB — BASIC METABOLIC PANEL
BUN: 40 mg/dL — ABNORMAL HIGH (ref 6–23)
Calcium: 9.8 mg/dL (ref 8.4–10.5)
GFR calc non Af Amer: 28 mL/min — ABNORMAL LOW (ref >60)
Glucose, Bld: 128 mg/dL — ABNORMAL HIGH (ref 70–99)
Potassium: 5.1 mEq/L (ref 3.5–5.1)

## 2011-02-02 LAB — GLUCOSE, CAPILLARY: Glucose-Capillary: 137 mg/dL — ABNORMAL HIGH (ref 70–99)

## 2011-02-06 NOTE — Discharge Summary (Signed)
NAME:  Robert [name], Robert [name] NO.:  [id number]  MEDICAL RECORD NO.:  [id number]           PATIENT TYPE:  I  LOCATION:  2003                         FACILITY:  [facility]  PHYSICIAN:  Jettie Booze, MDDATE OF BIRTH:  16-Apr-1928  DATE OF ADMISSION:  01/21/2011 DATE OF DISCHARGE:  01/29/2011                              DISCHARGE SUMMARY   PRIMARY CARE PHYSICIAN:  Lennette Bihari L. Little, MD  FINAL DIAGNOSES: 1. Acute inferoposterior myocardial infarction. 2. Severe mitral regurgitation. 3. Diabetes. 4. Hyperlipidemia. 5. Coronary artery disease.  PROCEDURES PERFORMED: 1. Cardiac catheterization showing occluded circumflex with drug-     eluting stent placed into the proximal circumflex by Dr. Daneen Schick.  Intra-aortic balloon pump placement at the same time,     residual 80% lesion in the proximal right coronary artery. 2. Serial echocardiograms revealing ejection fraction in the 45-50%     range with severe mitral regurgitation.  HOSPITAL COURSE:  The patient was admitted and found to be essentially in cardiogenic shock.  He was taken immediately to the cath lab because of ECG changes.  He had a balloon pump placed and an emergent angioplasty as noted above.  He had respiratory issues the first several days after his angioplasty.  He had pulmonary edema and severe shortness of breath.  He was on BiPAP for a short period of time.  He also had an episode of sustained ventricular tachycardia that was treated with amiodarone.  This was later changed to a beta blocker.    He also developed some renal insufficiency likely related to his cardiogenic shock.  He had his ACE inhibitor stopped.  His metformin was also stopped due to renal insufficiency.  His sugars a couple of days before discharge reached into the 400s at times.  He was maintained on his glipizide.  He diuresed well.  He was on renal dose dopamine for sometime and his blood pressure stabilized.  By Jan 26, 2011, he was walking around and was finally on room air.  He continued to diurese somewhat over the weekend and was deemed ready for discharge on Monday. Consultation with Dr. Arvid Right was obtained because the patient had severe mitral regurgitation following his MI.  This was thought secondary to papillary muscle dysfunction.  He was improving at the time of the consultation and so, no invasive procedures were planned.  DISCHARGE MEDICATIONS: 1. Tylenol 650 mg p.o. q.4 h. p.r.n. 2. Atorvastatin 40 mg daily. 3. Clopidogrel 75 mg daily. 4. Furosemide 40 mg daily. 5. Isosorbide 15 mg daily. 6. Pantoprazole 40 mg daily. 7. Potassium 10 mEq daily. 8. Aspirin 325 mg daily. 9. Fish oil 1000 mg daily. 10.Glipizide XL 10 mg 1 tablet b.i.d. 11.Metoprolol 25 mg b.i.d. 12.Multivitamin. 13.Ocuvite.  He is to stop taking metformin.  He is to stop taking     lisinopril/hydrochlorothiazide and Pravachol.  ACTIVITY:  Increase activity slowly.  No lifting more than 10 pounds for about a week.  DIET:  Low-sodium, heart-healthy diet.  Followup appointments with Dr. Hassell Done office on Feb 01, 2011, at 10:15 a.m.  He will  also follow up with Dr. Rex Kras in about a week.  He should call our office if he has any shortness of breath or chest pain.     Jettie Booze, MD     JSV/MEDQ  D:  01/29/2011  T:  01/29/2011  Job:  [id number]  cc:   Gilford Raid, M.D.  Electronically Signed by Larae Grooms MD on 02/06/2011 08:14:07 AM

## 2011-02-06 NOTE — Discharge Summary (Signed)
NAME:  Robert [name], Robert [name]                ACCOUNT NO.:  [id number]  MEDICAL RECORD NO.:  [id number]           PATIENT TYPE:  I  LOCATION:  [id number]                         FACILITY:  [facility]  PHYSICIAN:  Jettie Booze, MDDATE OF BIRTH:  [birth year]  DATE OF ADMISSION:  01/30/2011 DATE OF DISCHARGE:  02/02/2011                              DISCHARGE SUMMARY   FINAL DIAGNOSES: 1. Difficult to control diabetes. 2. Recent inferoposterior myocardial infarction. 3. Mitral regurgitation.  HOSPITAL COURSE:  The patient was admitted after describing very felt hot.  He had been discharged just few days before.  He had had significant myocardial infarction with mitral regurgitation.  Upon admission, his chest x-ray was improved, his BNP was also improved, he was admitted to the Cardiology Service.  His sugar was found to be 644. He does admit that it may be he does not always follow his regular diabetic diet.  He was watched for 3 nights in the hospital.  He basically stayed on his home medications.  He had no significant cardiac events.  He was seen by the [facility] hospitalist for diabetes management. He is not allowed to take metformin because of his renal insufficiency. He was continued on glipizide.  He showed some evidence of some mild confusion.  The [facility] hospitalist did not want to put him on insulin because of possibility of him mismanaging this at home.  He apparently refused to be examined at one point by the [facility]ist and they signed off.  We also got recommendations from inpatient diabetes program, they said to consider switching to Amaryl since it is once a day, but other than insulin did not suggest any other additional oral agents.  He does require follow up with his primary care doctor.  He had a couple of hypoglycemic events while on the moderate sliding scale insulin dosing, he does not really remember them.  DISCHARGE MEDICATIONS: 1. Metoprolol 12.5 mg b.i.d. 2. Aspirin  325 mg daily. 3. Tylenol. 4. Lipitor 40 mg daily. 5. Plavix 75 mg daily. 6. Fish oil. 7. Furosemide 40 mg daily. 8. Glipizide 10 mg b.i.d. 9. Isosorbide 15 mg daily. 10.Multivitamin. 11.Ocuvite. 12.Pantoprazole 40 mg daily. 13.He is to stop taking potassium. 14.He is also on a new dose of metoprolol.  FOLLOWUP APPOINTMENTS:  Dr. Rex Kras on Feb 06, 2011, and with Dr. Hassell Done office on Feb 07, 2011.  Diet:  He is to follow low-sodium heart-healthy low sugar diet. Activity:  Increase activity slowly.  SPECIAL INSTRUCTIONS:  He will take an additional Lasix at home if he has worsening shortness of breath, this was communicated to his wife.  I do think there is some component of memory loss with this patient. There may also be a component of anxiety.  He exhibited this during his first hospitalization as well with episodic shortness of breath while having normal oxygen saturations the symptoms were relieved by anxiolytic medication.  Hopefully, this can be addressed with his primary care physician as needed.  He is also to have diabetes medicines adjusted with his primary care physician.     Jettie Booze, MD  JSV/MEDQ  D:  02/02/2011  T:  02/03/2011  Job:  [id number]  cc:   Lennette Bihari L. Little, M.D.  Electronically Signed by Larae Grooms MD on 02/06/2011 08:14:35 AM

## 2011-02-09 NOTE — Discharge Summary (Signed)
NAME:  Robert [name], Robert [name] NO.:  [id number]   MEDICAL RECORD NO.:  [id number]                   PATIENT TYPE:  INP   LOCATION:  6523                                 FACILITY:  [facility]   PHYSICIAN:  Barnett Abu, M.D.               DATE OF BIRTH:  [birth year]   DATE OF ADMISSION:  08/23/2003  DATE OF DISCHARGE:  08/25/2003                                 DISCHARGE SUMMARY   CHIEF COMPLAINT/REASON FOR ADMISSION:  Robert [name] is a 77 year old male  patient with no prior history of coronary artery disease who presented to  the ER with a complaint of chest pain.  He was awakened early in the morning  with typical chest pain symptoms associated with shortness of breath and  nausea.  He initially did not feel that this was cardiac in nature, thought  it was pulmonary.  This was similar to his prior episodes of pneumonia.  He  walked to the bathroom, had some experience of emesis.  This did not relieve  his chest discomfort.  Then he began experiencing pain in the left arm and  subsequently in the right arm, pressure like in nature.  Patient said the  pain waxed and waned with walking and lying down.  He has not had any prior  type of chest pain or shortness of breath with exertion previous to his most  acute occurrence.  He reportedly walks 30 minutes daily.  He continued to  have the above-stated chest pain with radiation to the arm, which was not  relieved after several hours.  He called his primary care physician, Dr.  Sheryn [name], who requested that the patient come to the ER for evaluation.  After receiving antiemetics and pain medication, the patient at the time of  initial evaluation of the cardiologist was experiencing chest pain of a  level of 2-3/10.  He had received aspirin, morphine, and Phenergan in the  ER.   PHYSICAL EXAMINATION:  GENERAL:  Initial evaluation demonstrates a pleasant,  mildly confused male.  Wife at the bedside, assistance with history.  VITAL SIGNS:  Temperature 97.4, BP 125/78, pulse 60, respirations 19.  Physical examination was otherwise unremarkable except for left carotid  bruit and sinus rhythm with a bradycardic rate.   LABORATORY DATA:  White count 9300, hemoglobin 13.8, platelet count 258,000.  Sodium 142, potassium 4.3, chloride 108, CO2 26, BUN 14, creatinine 1.0,  glucose 293.  Myoglobin and other cardiac markers were positive, as follows:  Myoglobin ran 233-287 and then 309.  Troponin Is were as follows:  0.7 to  0.32 to 0.66.   Initial EKG showed sinus bradycardia with early repolarization with normal R-  wave progression, normal axis, no acute ST-T wave changes noted.   Chest x-ray was negative for acute disease.   DIAGNOSES:  1. Typical chest pain, non-ST segment elevation myocardial infarction with  positive cardiac enzymes and unstable angina pectoris.  2. Adult-onset diabetes mellitus.  3. Left carotid bruit.   HOSPITAL COURSE:  Problem #1.  Chest pain, unstable angina, non-ST segment  elevated MI:  Later on the afternoon of admission, the patient was taken to  the catheterization laboratory where he underwent a left heart  catheterization that showed an EF of 55%.  Diagonal-1 had diffuse sequential  disease up to 90%.  LAD L1 was 20-30%.  Circumflex, large OM-2, descending  limb, approximately 40% lesion.  RCA had a 30-40% proximal lesion, 100% mid  vessel occlusion with left-to-right collaterals.  Dr. Leonia Reeves subsequently  performed a PTCA of the mid RCA lesion, 100% down to 0%, with placement of  tandem CYPHER stent.  Patient tolerated the procedure well.  Groin was  unremarkable without hematoma.  He had intact distal pulses postprocedure.  The catheterization site had hemostasis per AngioSeal.   Twenty-four hours postprocedure, the patient was feeling quite well.  He was  having no further recurrence of chest pain.  The right groin was stable.  Peak Troponin I was 37.1.  Subsequent lipid  panel demonstrated a cholesterol  of 121, triglycerides 80, HDL cholesterol 34, LDL 71.  Upon admission, the  patient had been started on Lovenox, aspirin, Lopressor, and Zocor.  All  except the Lovenox are planned to be continued postdischarge, due to the  patient's positive MI status.   Problem #2.  Carotid bruit:  Patient underwent bilateral carotid Dopplers  which demonstrated a 40-60% ICA stenosis on the right with normal left.  Patient is already on aspirin and Plavix, and this can be followed annually  with carotid Dopplers.   DISCHARGE DIAGNOSES:  1. Non-ST segment elevation myocardial infarction, status post percutaneous     transluminal coronary angioplasty and tandem CYPHER stent to the mid     right coronary artery.  2. Right nonobstructive internal carotid artery stenosis 40-60%,     asymptomatic.  3. Adult-onset diabetes mellitus.   DISCHARGE MEDICATIONS:  1. Aspirin 325 mg daily.  2. Lopressor 25 mg b.i.d.  3. Plavix 75 mg daily.  4. Zocor 40 mg daily.  5. Glucotrol XL 10 mg daily.   ACTIVITY:  No driving on the date of discharge.  No bending, stooping,  straining, or lifting greater than five pounds x2 days.   DIET:  Low fat, low cholesterol, low salt with diabetic modifiers.  __________ only for the next two days.   SPECIAL INSTRUCTIONS:  After discharge, the patient is to go by the office  to pick up samples of Zocor and Plavix.   FOLLOWUP:  He has an appointment to see Dr. Leonia Reeves on December 9, Thursday,  at 9:45 a.m.      Nipomo Joycelyn Man                    Barnett Abu, M.D.    ALE/MEDQ  D:  09/07/2003  T:  09/07/2003  Job:  [id number]

## 2011-02-09 NOTE — Cardiovascular Report (Signed)
NAME:  Robert [name], LAWS NO.:  [id number]   MEDICAL RECORD NO.:  [id number]                   PATIENT TYPE:  INP   LOCATION:  [id number]                                 FACILITY:  [facility]   PHYSICIAN:  Fabio Asa, M.D.                 DATE OF BIRTH:  04-12-1928   DATE OF PROCEDURE:  DATE OF DISCHARGE:                              CARDIAC CATHETERIZATION   PROCEDURES PERFORMED:   CARDIOLOGIST:  Fabio Asa, M.D.   INDICATIONS FOR PROCEDURE:  Chest pain with positive troponins.   DESCRIPTION OF PROCEDURE:  After obtaining written informed consent the  patient was brought to the cardiac catheterization lab in the post  absorptive state.  Preop sedation was achieved using IV Versed.  The right  groin was prepped and draped in the usual sterile fashion.  Local anesthesia  was achieved using 1% Xylocaine.  A 6 French hemostasis sheath was placed  into the right femoral artery using the modified Seldinger technique.  Selective coronary angiography was performed using JL-4 and JR-4 Judkins  catheters.  Multiple views were obtained.  All catheter exchanges were made  over a guidewire.  Singe-plane ventriculogram was performed in the RAO  position using a 6 French pigtail curved catheter.   The films were then reviewed with Dr. Leonia Reeves.  It was felt that  intervention on the right coronary artery, plus or minus the diagonal, was  recommended.   FINDINGS:   HEMODYNAMIC DATA:  Aortic pressure was 129/65.  LV pressure 120/7.  EDP was  11.   VENTRICULOGRAPHIC DATA:  Single-plane ventriculogram revealed inferobasal  akinesis.  Ejection fraction of approximately 55%.   ANGIOGRAPHIC DATA:  Coronary Angiography  Left Main Coronary Artery:  The left main coronary artery is short,  bifurcates into the left anterior descending and circumflex vessel.  There  is approximately 20% proximal lesion in the left main coronary artery.   Left Anterior Descending:  The left  anterior descending gives rise to a  large D-1, multiple smaller diagonals and ends as an apical branch.  There  are luminal irregularities in the left anterior descending of up to 205.  The first diagonal is approximately a 1.5 mm size vessel.  There is a 90%  stenosis noted in the diagonal.   Circumflex Vessel:  The circumflex vessel is codominant for the posterior  circulation and gives rise to a trivial OM-1, trivial OM-2, large  bifurcating OM-3 and ends as a posterolateral branch.  There are left-to-  right collaterals from the circumflex as well.  The descending limb of the  third obtuse marginal has a 40% lesion.   Right Coronary Artery:  The right coronary artery is codominant.  There is a  30-40% lesion.  This was followed by 100% occlusion.   FINAL IMPRESSION:  Critical disease involving the first diagonal as well as  the right coronary artery.  There as  inferobasal akinesis with preserved  ejection fraction of 55%.   The films were reviewed with Dr. Rodell Perna who will proceed with  intervention of the right and possible intervention on the diagonal.                                               Fabio Asa, M.D.    HP/MEDQ  D:  08/23/2003  T:  08/23/2003  Job:  [id number]   cc:   Russ Halo. Sheryn Bison, M.D.  [address]  [place]  [place] [address]  Fax: [phone]
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
## 2011-02-20 NOTE — Consult Note (Signed)
NAME:  Robert [name], Robert [name]                ACCOUNT NO.:  [id number]  MEDICAL RECORD NO.:  [id number]           PATIENT TYPE:  I  LOCATION:  [id number]                         FACILITY:  [facility]  PHYSICIAN:  Gaylynn Seiple, DO         DATE OF BIRTH:  [birth year]  DATE OF CONSULTATION: DATE OF DISCHARGE:                                CONSULTATION   REASON FOR CONSULT:  Management of medical issues.  CHIEF COMPLAINT:  Blood sugars are out of control, renal failure, dyspnea on exertion and mitral regurgitation.  HISTORY OF PRESENT ILLNESS:  The patient is an 77 year old male who was actually discharged from this facility on Jan 29, 2011, with discharge diagnosis of acute inferior posterior myocardial infarction which required a stent to his proximal circumflex as well as an intraaortic balloon pump.  The patient also had an echocardiogram that demonstrated that he had severe mitral regurgitation.  Dr. Cyndia Bent for cardiothoracic surgery was consulted and it was his determination at the time that due to the patient's multiple comorbidities and poor status, it would be best to wait and do surgery in the future if possible as the patient had been given drug-eluting stent and was on Plavix and had generally poor health status at this time.  The patient went home on Jan 29, 2011, and he presented again to the emergency department on Jan 30, 2011, complaining of severe dyspnea on exertion.  He is feeling progressively worse.  He had paroxysmal nocturnal dyspnea and a progressive dyspnea. The patient had severe orthopnea, but at the time he denied chest pain, presyncope, palpitations, increased lower extremity edema, or increased weight gain.  The patient also states that he had very poor urine output when he was at home.  The patient was admitted to the service of Dr. [name] on the telemetry unit.  The patient was continued on his antiplatelet therapy.  The patient has been treated with afterload reduction for  his mitral regurg, his beta-blocker dosage was increased. The patient also has ischemic cardiomyopathy with an ejection fraction 45%.  The patient did have severe hyperglycemia as well while he was here, and he also was not initially fit.  His home medications were continued and then last night his blood sugar dropped to 47.  Thus far, his glucoses have been in the 180s with the exception of the episode of hypoglycemia last night and the glucose is 693 on the chemistry drawn on the evening of Jan 30, 2011.  The patient's diet as he has yet to eat meals since he has been here.  He is about to eat.  The patient stays at home on his usual regimen, glipizide 10 one p.o. b.i.d.  His glucoses tend to run 85-125.  PAST MEDICAL HISTORY: 1. Significant for coronary artery disease status post drug-eluting     stent to the circumflex and drug eluting stent to the right     coronary artery in 2004. 2. Ischemic cardiomyopathy with an ejection fraction of 45%. 3. Severe mitral regurgitation thought to be due to his papillary     muscle dysfunction, was probably  chronic renal failure.  His     creatinine on Jan 26, 2011, was 2 that was at presentation. 4. Hypertension. 5. Diabetes mellitus type 2 for which the patient takes glipizide 10     mg one p.o. b.i.d. and his blood sugars tend to run 85-125. 6. Hyperlipidemia.  MEDICATIONS AT HOME: 1. Tylenol 325 one p.o. q.6 h. p.r.n. pain or temp greater 100.2. 2. Lipitor 40 mg one p.o. daily. 3. Plavix 75 mg one p.o. daily. 4. Lasix 40 mg one p.o. daily. 5. Imdur 15 mg one p.o. daily. 6. Pantoprazole 40 mg one p.o. daily. 7. Potassium chloride 40 mEq one p.o. daily. 8. Aspirin 325 mg daily. 9. Fish oil 1000 mg one p.o. daily. 10.Glipizide 10 mg one p.o. b.i.d. 11.Metoprolol 25 mg one p.o. b.i.d. 12.Multivitamin one p.o. daily. 13.Ocuvite one p.o. daily.  ALLERGIES:  NKDA.  SOCIAL HISTORY:  The patient lives in [place] with his wife.   No smoking.  No alcohol.  FAMILY HISTORY:  Positive for premature coronary artery disease.  REVIEW OF SYSTEMS:  CONSTITUTIONAL:  Negative for fever.  Negative for chills.  Positive for weakness.  Positive for fatigue.  CNS:  No headaches, no seizures, no limb weakness.  ENT:  No nasal congestion. No throat pain.  No coryza.  CARDIOVASCULAR:  Negative for chest pain. Negative for palpations.  Positive for orthopnea.  RESPIRATORY: Positive for shortness of breath with lying flat with exertion. Negative for cough.  Negative for wheeze.  GASTROINTESTINAL:  No nausea, no vomiting, no constipation, no diarrhea.  GENITOURINARY:  No dysuria, no hematuria, no urinary frequency.  RENAL:  No flank pain.  No swelling.  No pruritus.  SKIN:  No rashes, no sores, or lesions. HEMATOLOGICAL:  No easy bruising, no purpura, no clots.  LYMPHS:  No lymphadenopathy.  No painful nodes.  No specific lymph swelling. PSYCHIATRIC:  No anxiety.  No depression.  No insomnia.  PHYSICAL EXAMINATION:  VITAL SIGNS:  The patient is satting 96% on room air, heart rate 73, respirations 18, blood pressure 108/59. GENERAL:  The patient is elderly weak and frail.  He is awake, alert, and oriented x3 and able to give a decent history. EYES:  Pupils equal, round, reactive to light and accommodation. External ocular movements are intact.  Sclerae nonicteric, noninjected. Mouth:  Oral mucosa is dry.  No lesions.  No sores.  Pharynx is clear. No erythema, no exudate. NECK:  Negative for JVD.  Negative for thyromegaly.  Negative for lymphadenopathy. HEART:  Regular rate and rhythm at 70 beats per minute without murmur, ectopy, or gallops.  No lateral PMI.  No thrills. LUNGS:  Clear to auscultation bilaterally without wheezes, rales, or rhonchi.  No increased work of breathing.  No tactile fremitus. ABDOMEN:  Soft, nontender, nondistended.  Positive bowel sounds.  No hepatosplenomegaly.  No hernias palpated. EXTREMITIES:   Negative for cyanosis, clubbing, or edema. NEUROLOGIC: Cranial nerves II through XII grossly intact.  Motor and sensory intact.  LABORATORY DATA:  The chemistry from the Jan 30, 2011, showed a sodium of 129, potassium 4.9, chloride 104, CO2 23, BUN 41, creatinine 2.50, glucose at the time of 693.  This morning, the patient's hemoglobin was 11.6, hematocrit 34.0, TSH was 2.737.  The patient's fingerstick blood sugars have run from most recent to the first one he first got here have been 185, 47, 693, 182, 185.  ASSESSMENT: 1. Diabetes mellitus type 2.  The patient's blood sugars have been  quite labile since he has been here.  At home, he received     glipizide 10 mg one p.o. daily and is one p.o. b.i.d. and his     fingerstick blood sugars were well controlled on this.  I believe     this dose of glipizide is really maximal for this patient with     renal failure.  I do not think I would add anything to this, except     perhaps sliding scale and I would reduce the sliding scale to     moderate off of resistant given his renal failure.  I believe that     the hypoglycemia last night was likely due to the fact that the     patient had not been fed, and so I would feed him medium carb     modified diet and continue to monitor his fingerstick blood sugars     carefully. 2. Renal failure acute on chronic, creatinine at this time is 2.5, on     Jan 26, 2011, was 2.0.  we will avoid nephrotoxic agents and monitor     creatinine closely, perhaps his Lipitor 40 mg a day is a little     high.  It would probably be better if it were reduced to 20 mg by recommendations. 1. Hypertension.  Blood pressure on the low side actually now, but the     patient is asymptomatic, awake and alert. 2. Hyperlipidemia again statin, I would recommend reduction of this     dose to 20 mg daily. 3. Mitral regurg likely because the patient continued to stay on     exertion as per Cardiology and Cardiothoracic  Surgery. 4. Coronary artery disease, status post myocardial infarction     requiring drug-eluting stent as per Cardiology. 5. Congestive heart failure with ejection fraction of 45% to 50%.     This also may be effecting his creatinine.          ______________________________ Karie Kirks, DO     AS/MEDQ  D:  01/31/2011  T:  02/01/2011  Job:  [id number]  cc:   Jeanella Craze. Little, M.D. Gilford Raid, M.D.  Electronically Signed by Karie Kirks DO on 02/19/2011 11:51:08 PM

## 2011-03-02 NOTE — H&P (Signed)
NAME:  DETRAVION, RONCHETTI NO.:  [id number]  MEDICAL RECORD NO.:  [id number]           PATIENT TYPE:  E  LOCATION:  [facility]                         FACILITY:  [facility]  PHYSICIAN:  Johnney Ou, MD DATE OF BIRTH:  09/18/1928  DATE OF ADMISSION:  01/30/2011 DATE OF DISCHARGE:                             HISTORY & PHYSICAL   CARDIOLOGIST:  Jettie Booze, MD  PRIMARY DOCTOR:  Priscille Heidelberg. Little, MD  CHIEF COMPLAINT:  Shortness of breath.  HISTORY OF PRESENT ILLNESS:  This is a 77 year old white male with a history of coronary artery disease, recently discharged on Jan 29, 2011, after an ST-elevation MI complicated by cardiogenic shock and severe mitral regurgitation who returns to the emergency department after feeling progressively worse over the past 2 days.  He reports progressive dyspnea on exertion and orthopnea with paroxysmal nocturnal dyspnea.  These symptoms are progressive though he does report during his hospitalization where he mainly laid upright in the hospital bed. He attempted to sleep this evening and felt severely short of breath and felt that he needed further evaluation in the emergency department.  He has been compliant with his medications, though several of his antihypertensive medications and diabetic medical regimen was discontinued in context of acute on chronic renal insufficiency. Otherwise, he denies chest pain, presyncope, palpitations, increased lower extremity edema, or increased weight gain.  He does feel that his urine output has slightly decreased even though he did take an extra dose of Lasix.  PAST MEDICAL HISTORY: 1. Coronary artery disease status post drug-eluting stent to the     circumflex artery on April 29, 0000000, complicated by cardiogenic     shock and also status post drug-eluting stent to right coronary     artery in 2004. 2. Severe mitral regurgitation thought to be secondary to papillary     muscle dysfunction,  ejection fraction 45%. 3. Hypertension. 4. Diabetes type 2. 5. Hyperlipidemia.  ALLERGIES:  No known drug allergies.  MEDICATIONS ON ADMISSION: 1. Tylenol as needed. 2. Lipitor 40 daily. 3. Plavix 75 mg daily. 4. Lasix 40 mg daily. 5. Imdur 15 mg daily. 6. Pantoprazole 40 mg daily. 7. Potassium chloride 40 mEq daily. 8. Aspirin 325 mg daily. 9. Fish oil 1000 mg daily. 10.Glipizide 10 mg twice daily. 11.Metoprolol 25 mg twice daily. 12.Multivitamin. 13.Ocuvite.  SOCIAL HISTORY:  He lives in [place] with his wife.  He is retired. He does not smoke.  FAMILY HISTORY:  Positive for premature coronary artery disease.  REVIEW OF SYSTEMS:  All 14 systems are reviewed and were negative except as mentioned in detail in the HPI.  PHYSICAL EXAMINATION:  VITAL SIGNS:  Blood pressure was 107/71, respiratory rate 16, pulse is 84, saturating 96% on room air sitting upright though saturations dropped to approximately 70% while lying supine. GENERAL:  He is an 77 year old elderly-appearing white male, appearingstated age, in no acute distress. HEENT:  Moist mucous membranes.  Pupils are equal, round, and reactive to light and accommodation.  Anicteric sclerae. NECK:  No jugular venous distention.  No thyromegaly. CARDIOVASCULAR:  Regular rate and rhythm with 2/6  midsystolic murmur heard best at the apex. LUNGS:  Clear to auscultation bilaterally though mild decreased breath sounds at the bases bilaterally, though left was greater than right. ABDOMEN:  Nontender, nondistended.  Positive bowel sounds.  No mass. EXTREMITIES:  No clubbing, cyanosis, or edema. NEUROLOGIC:  Alert and oriented x3.  Cranial nerves II through XII grossly intact.  No focal neurologic deficits. SKIN:  Warm, dry, and intact.  No rashes. PSYCHIATRIC:  Mood and affect are appropriate.  RADIOLOGY:  Chest x-ray showed improved pulmonary vascular congestion with mild bilateral pleural effusions.  EKG showed normal  sinus rhythm with a rate of 81 beats per minute with nonspecific ST and T-wave abnormalities and a first-degree AV block.  LABORATORY DATA:  White count was 10.2, hematocrit was 32.2, potassium is 4.9, creatinine is 2.6.  His troponin is 18, which is down from a greater than 100 on Jan 23, 2011.  Blood sugar is 693.  ASSESSMENT/PLAN:  This is an 77 year old white male with a recent myocardial infarction complicated by severe mitral regurgitation here with orthopnea and hyperglycemia. 1. Coronary artery disease.  He is not complaining of any chest pain     at this time.  His troponin is improving and his EKG is     nonischemic.  We will continue his current dual antiplatelet     therapy and watch him carefully. 2. Severe mitral regurgitation.  This is likely the culprit for his     symptoms.  His symptoms are progressive though he does report the     same type of symptoms while in hospital, which suggests that they     are subacute in nature and likely related to his initial event.  We     will try to treat him medically at this time with afterload     reduction and we will increase his beta-blocker.  We will consider     revisiting potential surgery, though he will be on dual-     antiplatelet therapy for at least 6 weeks and he has been seen by     surgeons prior to his discharge from last hospitalization. 3. Ischemic cardiomyopathy with an ejection fraction of 45%.  He is     euvolemic by exam and potentially a low volume deplete, possibly     related to his hyperglycemia and his continued Lasix.  We will     watch him carefully and treat his poorly-controlled diabetes and     continue his maintenance diuresis. 4. Diabetes.  We will start him on insulin now, he may need an insulin     drip to get his blood sugar under control.  We will start sliding     scale insulin initially and watch his blood sugars carefully.  We     will continue his oral hypoglycemic agents at this  time.     Johnney Ou, MD     BHH/MEDQ  D:  01/30/2011  T:  01/30/2011  Job:  [id number]  Electronically Signed by Matthew Saras MD on 03/02/2011 08:53:42 PM

## 2011-07-16 ENCOUNTER — Encounter (INDEPENDENT_AMBULATORY_CARE_PROVIDER_SITE_OTHER): Payer: Medicare Other | Admitting: Ophthalmology

## 2011-10-07 ENCOUNTER — Other Ambulatory Visit: Payer: Self-pay

## 2011-10-18 ENCOUNTER — Emergency Department (HOSPITAL_COMMUNITY): Payer: Medicare Other

## 2011-10-18 ENCOUNTER — Encounter: Payer: Self-pay | Admitting: Cardiology

## 2011-10-18 ENCOUNTER — Other Ambulatory Visit: Payer: Self-pay

## 2011-10-18 ENCOUNTER — Inpatient Hospital Stay (HOSPITAL_COMMUNITY)
Admission: EM | Admit: 2011-10-18 | Discharge: 2011-10-21 | DRG: 377 | Disposition: A | Discharge status: Home / Self Care | Payer: Medicare Other | Attending: Interventional Cardiology | Admitting: Interventional Cardiology

## 2011-10-18 DIAGNOSIS — I252 Old myocardial infarction: Secondary | ICD-10-CM | POA: Diagnosis not present

## 2011-10-18 DIAGNOSIS — K259 Gastric ulcer, unspecified as acute or chronic, without hemorrhage or perforation: Secondary | ICD-10-CM | POA: Diagnosis not present

## 2011-10-18 DIAGNOSIS — J90 Pleural effusion, not elsewhere classified: Secondary | ICD-10-CM | POA: Diagnosis not present

## 2011-10-18 DIAGNOSIS — D539 Nutritional anemia, unspecified: Secondary | ICD-10-CM | POA: Diagnosis present

## 2011-10-18 DIAGNOSIS — K921 Melena: Secondary | ICD-10-CM | POA: Diagnosis not present

## 2011-10-18 DIAGNOSIS — E1129 Type 2 diabetes mellitus with other diabetic kidney complication: Secondary | ICD-10-CM | POA: Insufficient documentation

## 2011-10-18 DIAGNOSIS — I2510 Atherosclerotic heart disease of native coronary artery without angina pectoris: Secondary | ICD-10-CM | POA: Diagnosis not present

## 2011-10-18 DIAGNOSIS — I059 Rheumatic mitral valve disease, unspecified: Secondary | ICD-10-CM | POA: Diagnosis not present

## 2011-10-18 DIAGNOSIS — K2970 Gastritis, unspecified, without bleeding: Secondary | ICD-10-CM | POA: Diagnosis not present

## 2011-10-18 DIAGNOSIS — Z7982 Long term (current) use of aspirin: Secondary | ICD-10-CM | POA: Diagnosis not present

## 2011-10-18 DIAGNOSIS — I658 Occlusion and stenosis of other precerebral arteries: Secondary | ICD-10-CM | POA: Diagnosis present

## 2011-10-18 DIAGNOSIS — I5023 Acute on chronic systolic (congestive) heart failure: Secondary | ICD-10-CM | POA: Diagnosis not present

## 2011-10-18 DIAGNOSIS — I6529 Occlusion and stenosis of unspecified carotid artery: Secondary | ICD-10-CM | POA: Diagnosis present

## 2011-10-18 DIAGNOSIS — Z9861 Coronary angioplasty status: Secondary | ICD-10-CM

## 2011-10-18 DIAGNOSIS — N058 Unspecified nephritic syndrome with other morphologic changes: Secondary | ICD-10-CM | POA: Diagnosis present

## 2011-10-18 DIAGNOSIS — I2119 ST elevation (STEMI) myocardial infarction involving other coronary artery of inferior wall: Secondary | ICD-10-CM | POA: Insufficient documentation

## 2011-10-18 DIAGNOSIS — D649 Anemia, unspecified: Secondary | ICD-10-CM | POA: Diagnosis not present

## 2011-10-18 DIAGNOSIS — I509 Heart failure, unspecified: Secondary | ICD-10-CM | POA: Diagnosis not present

## 2011-10-18 DIAGNOSIS — D500 Iron deficiency anemia secondary to blood loss (chronic): Secondary | ICD-10-CM | POA: Diagnosis not present

## 2011-10-18 DIAGNOSIS — Z8546 Personal history of malignant neoplasm of prostate: Secondary | ICD-10-CM | POA: Diagnosis not present

## 2011-10-18 DIAGNOSIS — D131 Benign neoplasm of stomach: Secondary | ICD-10-CM | POA: Diagnosis not present

## 2011-10-18 DIAGNOSIS — E782 Mixed hyperlipidemia: Secondary | ICD-10-CM | POA: Insufficient documentation

## 2011-10-18 DIAGNOSIS — N184 Chronic kidney disease, stage 4 (severe): Secondary | ICD-10-CM | POA: Insufficient documentation

## 2011-10-18 DIAGNOSIS — D509 Iron deficiency anemia, unspecified: Secondary | ICD-10-CM | POA: Diagnosis present

## 2011-10-18 DIAGNOSIS — Z79899 Other long term (current) drug therapy: Secondary | ICD-10-CM | POA: Diagnosis not present

## 2011-10-18 DIAGNOSIS — D508 Other iron deficiency anemias: Secondary | ICD-10-CM | POA: Diagnosis not present

## 2011-10-18 DIAGNOSIS — I5021 Acute systolic (congestive) heart failure: Secondary | ICD-10-CM | POA: Diagnosis not present

## 2011-10-18 DIAGNOSIS — R1013 Epigastric pain: Secondary | ICD-10-CM | POA: Diagnosis not present

## 2011-10-18 DIAGNOSIS — K922 Gastrointestinal hemorrhage, unspecified: Secondary | ICD-10-CM | POA: Diagnosis not present

## 2011-10-18 DIAGNOSIS — R0602 Shortness of breath: Secondary | ICD-10-CM | POA: Diagnosis not present

## 2011-10-18 DIAGNOSIS — I10 Essential (primary) hypertension: Secondary | ICD-10-CM | POA: Insufficient documentation

## 2011-10-18 HISTORY — DX: Gastrointestinal hemorrhage, unspecified: K92.2

## 2011-10-18 HISTORY — DX: Anemia, unspecified: D64.9

## 2011-10-18 HISTORY — DX: Encounter for other specified aftercare: Z51.89

## 2011-10-18 HISTORY — DX: Malignant neoplasm of prostate: C61

## 2011-10-18 HISTORY — DX: Anxiety disorder, unspecified: F41.9

## 2011-10-18 HISTORY — DX: Peripheral vascular disease, unspecified: I73.9

## 2011-10-18 HISTORY — DX: Atherosclerotic heart disease of native coronary artery without angina pectoris: I25.10

## 2011-10-18 HISTORY — DX: Reserved for inherently not codable concepts without codable children: IMO0001

## 2011-10-18 LAB — CBC
MCH: 25.3 pg — ABNORMAL LOW (ref 26.0–34.0)
MCV: 81.9 fL (ref 78.0–100.0)
Platelets: 302 10*3/uL (ref 150–400)
RDW: 19 % — ABNORMAL HIGH (ref 11.5–15.5)
WBC: 9.2 10*3/uL (ref 4.0–10.5)

## 2011-10-18 LAB — COMPREHENSIVE METABOLIC PANEL
ALT: 10 U/L (ref 0–53)
Alkaline Phosphatase: 71 U/L (ref 39–117)
BUN: 69 mg/dL — ABNORMAL HIGH (ref 6–23)
CO2: 25 mEq/L (ref 19–32)
GFR calc Af Amer: 26 mL/min — ABNORMAL LOW (ref >90)
GFR calc non Af Amer: 22 mL/min — ABNORMAL LOW (ref >90)
Glucose, Bld: 234 mg/dL — ABNORMAL HIGH (ref 70–99)
Potassium: 3.5 mEq/L (ref 3.5–5.1)
Sodium: 138 mEq/L (ref 135–145)
Total Protein: 6.6 g/dL (ref 6.0–8.3)

## 2011-10-18 LAB — GLUCOSE, CAPILLARY: Glucose-Capillary: 231 mg/dL — ABNORMAL HIGH (ref 70–99)

## 2011-10-18 LAB — TSH: TSH: 1.982 u[IU]/mL (ref 0.350–4.500)

## 2011-10-18 LAB — FOLATE: Folate: >20 ng/mL

## 2011-10-18 LAB — RETICULOCYTES: Retic Count, Absolute: 37.5 10*3/uL (ref 19.0–186.0)

## 2011-10-18 LAB — PREPARE RBC (CROSSMATCH)

## 2011-10-18 LAB — BASIC METABOLIC PANEL
Calcium: 9.5 mg/dL (ref 8.4–10.5)
Chloride: 100 mEq/L (ref 96–112)
Creatinine, Ser: 2.63 mg/dL — ABNORMAL HIGH (ref 0.50–1.35)
GFR calc Af Amer: 25 mL/min — ABNORMAL LOW (ref >90)
Sodium: 138 mEq/L (ref 135–145)

## 2011-10-18 LAB — PROTIME-INR
INR: 1.02 (ref 0.00–1.49)
Prothrombin Time: 13.6 seconds (ref 11.6–15.2)

## 2011-10-18 LAB — FERRITIN: Ferritin: 8 ng/mL — ABNORMAL LOW (ref 22–322)

## 2011-10-18 LAB — IRON AND TIBC
Iron: 18 ug/dL — ABNORMAL LOW (ref 42–135)
Saturation Ratios: 5 % — ABNORMAL LOW (ref 20–55)
TIBC: 360 ug/dL (ref 215–435)
UIBC: 342 ug/dL (ref 125–400)

## 2011-10-18 LAB — HEMOGLOBIN A1C: Mean Plasma Glucose: 171 mg/dL — ABNORMAL HIGH (ref <117)

## 2011-10-18 LAB — PRO B NATRIURETIC PEPTIDE: Pro B Natriuretic peptide (BNP): 2821 pg/mL — ABNORMAL HIGH (ref 0–450)

## 2011-10-18 LAB — CARDIAC PANEL(CRET KIN+CKTOT+MB+TROPI)
CK, MB: 3.1 ng/mL (ref 0.3–4.0)
Relative Index: INVALID (ref 0.0–2.5)
Total CK: 96 U/L (ref 7–232)
Troponin I: <0.3 ng/mL (ref <0.30)

## 2011-10-18 MED ORDER — FUROSEMIDE 10 MG/ML IJ SOLN
20.0000 mg | Freq: Once | INTRAMUSCULAR | Status: AC
  Administered 2011-10-18: 20 mg via INTRAVENOUS
  Filled 2011-10-18: qty 2

## 2011-10-18 MED ORDER — SODIUM CHLORIDE 0.9 % IJ SOLN: 3.0000 mL | INTRAMUSCULAR | Status: DC | PRN

## 2011-10-18 MED ORDER — NITROGLYCERIN 0.4 MG SL SUBL: 0.4000 mg | SUBLINGUAL_TABLET | SUBLINGUAL | Status: DC | PRN

## 2011-10-18 MED ORDER — SODIUM CHLORIDE 0.9 % IV SOLN
250.0000 mL | INTRAVENOUS | Status: DC | PRN
  Administered 2011-10-19: 250 mL via INTRAVENOUS

## 2011-10-18 MED ORDER — INSULIN ASPART 100 UNIT/ML ~~LOC~~ SOLN
0.0000 [IU] | Freq: Every day | SUBCUTANEOUS | Status: DC
  Administered 2011-10-18: 4 [IU] via SUBCUTANEOUS
  Administered 2011-10-20: 3 [IU] via SUBCUTANEOUS

## 2011-10-18 MED ORDER — GLIPIZIDE 5 MG PO TABS
5.0000 mg | ORAL_TABLET | Freq: Two times a day (BID) | ORAL | Status: DC
  Administered 2011-10-18 – 2011-10-21 (×5): 5 mg via ORAL
  Filled 2011-10-18 (×8): qty 1

## 2011-10-18 MED ORDER — ASPIRIN 325 MG PO TABS: 325.0000 mg | ORAL_TABLET | ORAL | Status: DC

## 2011-10-18 MED ORDER — ASPIRIN 81 MG PO CHEW: 81.0000 mg | CHEWABLE_TABLET | Freq: Every day | ORAL | Status: DC

## 2011-10-18 MED ORDER — INSULIN ASPART 100 UNIT/ML ~~LOC~~ SOLN
0.0000 [IU] | Freq: Three times a day (TID) | SUBCUTANEOUS | Status: DC
  Administered 2011-10-19: 8 [IU] via SUBCUTANEOUS
  Administered 2011-10-20 (×2): 3 [IU] via SUBCUTANEOUS
  Administered 2011-10-20: 8 [IU] via SUBCUTANEOUS
  Administered 2011-10-21: 3 [IU] via SUBCUTANEOUS
  Filled 2011-10-18 (×2): qty 3

## 2011-10-18 MED ORDER — DIPHENHYDRAMINE HCL 50 MG/ML IJ SOLN: 12.5000 mg | Freq: Once | INTRAMUSCULAR | Status: DC

## 2011-10-18 MED ORDER — FUROSEMIDE 10 MG/ML IJ SOLN: 40.0000 mg | Freq: Two times a day (BID) | INTRAMUSCULAR | Status: DC

## 2011-10-18 MED ORDER — INSULIN ASPART 100 UNIT/ML ~~LOC~~ SOLN
SUBCUTANEOUS | Status: AC
  Administered 2011-10-18: 5 [IU] via SUBCUTANEOUS
  Filled 2011-10-18: qty 1

## 2011-10-18 MED ORDER — ONDANSETRON HCL 4 MG/2ML IJ SOLN: 4.0000 mg | Freq: Four times a day (QID) | INTRAMUSCULAR | Status: DC | PRN

## 2011-10-18 MED ORDER — ACETAMINOPHEN 325 MG PO TABS
650.0000 mg | ORAL_TABLET | ORAL | Status: DC | PRN
  Administered 2011-10-19: 650 mg via ORAL
  Filled 2011-10-18 (×2): qty 2

## 2011-10-18 MED ORDER — SODIUM CHLORIDE 0.9 % IJ SOLN
3.0000 mL | Freq: Two times a day (BID) | INTRAMUSCULAR | Status: DC
  Administered 2011-10-18 – 2011-10-20 (×4): 3 mL via INTRAVENOUS

## 2011-10-18 MED ORDER — METOPROLOL SUCCINATE 12.5 MG HALF TABLET
12.5000 mg | ORAL_TABLET | Freq: Every day | ORAL | Status: DC
  Administered 2011-10-18 – 2011-10-20 (×2): 12.5 mg via ORAL
  Filled 2011-10-18 (×4): qty 1

## 2011-10-18 MED ORDER — PANTOPRAZOLE SODIUM 40 MG PO TBEC
40.0000 mg | DELAYED_RELEASE_TABLET | Freq: Every day | ORAL | Status: DC
Start: 2011-10-18 — End: 2011-10-21
  Administered 2011-10-18 – 2011-10-20 (×3): 40 mg via ORAL
  Filled 2011-10-18 (×2): qty 1

## 2011-10-18 MED ORDER — INSULIN ASPART 100 UNIT/ML ~~LOC~~ SOLN
0.0000 [IU] | Freq: Three times a day (TID) | SUBCUTANEOUS | Status: DC
  Administered 2011-10-18: 5 [IU] via SUBCUTANEOUS

## 2011-10-18 NOTE — ED Notes (Signed)
Dr Marlou Porch here to see pt

## 2011-10-18 NOTE — ED Notes (Signed)
[id number] Ready

## 2011-10-18 NOTE — Consult Note (Signed)
[facility] Admission History & Physical  Chief Complaint: Weakness and chest pain and black stool HPI: Robert [name] is an 78 y.o. white male.  Male who presents with a 3 day history of black stools weakness and vague chest discomfort. He is on aspirin and Plavix for coronary artery disease with severe mitral regurgitation. He had a colonoscopy 2 years ago which revealed 2 polyps and is never had an EGD. He denies any dyspepsia and is not on any nonsteroidal anti-inflammatory drugs Past Medical History  Diagnosis Date  . Prostate cancer   . CAD (coronary artery disease), native coronary artery   . Acute MI inferior posterior subsequent episode care   . Severe mitral regurgitation   . Type II or unspecified type diabetes mellitus with renal manifestations, not stated as uncontrolled   . PVD (peripheral vascular disease) 39% bilateral carotids    History reviewed. No pertinent past surgical history.  Medications Prior to Admission  Medication Dose Route Frequency Provider Last Rate Last Dose  . 0.9 %  sodium chloride infusion  250 mL Intravenous PRN Candee Furbish, MD      . acetaminophen (TYLENOL) tablet 650 mg  650 mg Oral Q4H PRN Candee Furbish, MD      . diphenhydrAMINE (BENADRYL) injection 12.5 mg  12.5 mg Intravenous Once Candee Furbish, MD      . furosemide (LASIX) injection 20 mg  20 mg Intravenous Once Candee Furbish, MD      . glipiZIDE (GLUCOTROL) tablet 5 mg  5 mg Oral BID AC Candee Furbish, MD      . insulin aspart (novoLOG) injection 0-10 Units  0-10 Units Subcutaneous TID [facility] Candee Furbish, MD   5 Units at 10/18/11 1350  . insulin aspart (novoLOG) injection 0-15 Units  0-15 Units Subcutaneous TID WC Candee Furbish, MD      . insulin aspart (novoLOG) injection 0-5 Units  0-5 Units Subcutaneous QHS Candee Furbish, MD      . metoprolol succinate (TOPROL-XL) 24 hr tablet 12.5 mg  12.5 mg Oral Daily Candee Furbish, MD      . nitroGLYCERIN (NITROSTAT) SL tablet 0.4 mg  0.4 mg Sublingual Q5 min  PRN Jasper Riling. Alvino Chapel, MD      . ondansetron [facility]) injection 4 mg  4 mg Intravenous Q6H PRN Candee Furbish, MD      . pantoprazole (PROTONIX) EC tablet 40 mg  40 mg Oral Daily Candee Furbish, MD      . sodium chloride 0.9 % injection 3 mL  3 mL Intravenous Q12H Candee Furbish, MD      . sodium chloride 0.9 % injection 3 mL  3 mL Intravenous PRN Candee Furbish, MD      . DISCONTD: aspirin chewable tablet 81 mg  81 mg Oral Daily Candee Furbish, MD      . DISCONTD: aspirin tablet 325 mg  325 mg Oral STAT Nathan R. Pickering, MD      . DISCONTD: furosemide (LASIX) injection 40 mg  40 mg Intravenous Q12H Candee Furbish, MD       No current outpatient prescriptions on file as of 10/18/2011.    Allergies: No Known Allergies  No family history on file.  Social History:  does not have a smoking history on file. He does not have any smokeless tobacco history on file. His alcohol and drug histories not on file.  Negative except as above   Blood pressure 100/42, pulse 81, resp. rate 22, height 6\' 1"  (1.854 m), weight 72.576  kg (160 lb), SpO2 100.00%. Head: Normocephalic, without obvious abnormality, atraumatic Neck: no adenopathy, no carotid bruit, no JVD, supple, symmetrical, trachea midline and thyroid not enlarged, symmetric, no tenderness/mass/nodules Resp: clear to auscultation bilaterally Cardio: regular rate and rhythm, S1, S2 normal, no murmur, click, rub or gallop GI: Abdomen soft nondistended with normoactive bowel sounds no hepatomegaly masses or guarding Extremities: extremities normal, atraumatic, no cyanosis or edema  Results for orders placed during the hospital encounter of 10/18/11 (from the past 48 hour(s))  CBC     Status: Abnormal   Collection Time   10/18/11 11:25 AM      Component Value Range Comment   WBC 9.2  4.0 - 10.5 (K/uL)    RBC 2.77 (*) 4.22 - 5.81 (MIL/uL)    Hemoglobin 7.0 (*) 13.0 - 17.0 (g/dL)    HCT 22.7 (*) 39.0 - 52.0 (%)    MCV 81.9  78.0 - 100.0 (fL)    MCH 25.3 (*)  26.0 - 34.0 (pg)    MCHC 30.8  30.0 - 36.0 (g/dL)    RDW 19.0 (*) 11.5 - 15.5 (%)    Platelets 302  150 - 400 (K/uL)   BASIC METABOLIC PANEL     Status: Abnormal   Collection Time   10/18/11 11:25 AM      Component Value Range Comment   Sodium 138  135 - 145 (mEq/L)    Potassium 3.8  3.5 - 5.1 (mEq/L)    Chloride 100  96 - 112 (mEq/L)    CO2 24  19 - 32 (mEq/L)    Glucose, Bld 306 (*) 70 - 99 (mg/dL)    BUN 68 (*) 6 - 23 (mg/dL)    Creatinine, Ser 2.63 (*) 0.50 - 1.35 (mg/dL)    Calcium 9.5  8.4 - 10.5 (mg/dL)    GFR calc non Af Amer 21 (*) >90 (mL/min)    GFR calc Af Amer 25 (*) >90 (mL/min)   PRO B NATRIURETIC PEPTIDE     Status: Abnormal   Collection Time   10/18/11 11:26 AM      Component Value Range Comment   Pro B Natriuretic peptide (BNP) 2821.0 (*) 0 - 450 (pg/mL)   POCT I-STAT TROPONIN I     Status: Normal   Collection Time   10/18/11 11:47 AM      Component Value Range Comment   Troponin i, poc 0.03  0.00 - 0.08 (ng/mL)    Comment 3            PRO B NATRIURETIC PEPTIDE     Status: Abnormal   Collection Time   10/18/11  1:17 PM      Component Value Range Comment   Pro B Natriuretic peptide (BNP) 2984.0 (*) 0 - 450 (pg/mL)   GLUCOSE, CAPILLARY     Status: Abnormal   Collection Time   10/18/11  1:31 PM      Component Value Range Comment   Glucose-Capillary 231 (*) 70 - 99 (mg/dL)   COMPREHENSIVE METABOLIC PANEL     Status: Abnormal   Collection Time   10/18/11  1:50 PM      Component Value Range Comment   Sodium 138  135 - 145 (mEq/L)    Potassium 3.5  3.5 - 5.1 (mEq/L)    Chloride 101  96 - 112 (mEq/L)    CO2 25  19 - 32 (mEq/L)    Glucose, Bld 234 (*) 70 - 99 (mg/dL)    BUN 69 (*)  6 - 23 (mg/dL)    Creatinine, Ser 2.54 (*) 0.50 - 1.35 (mg/dL)    Calcium 9.0  8.4 - 10.5 (mg/dL)    Total Protein 6.6  6.0 - 8.3 (g/dL)    Albumin 3.4 (*) 3.5 - 5.2 (g/dL)    AST 13  0 - 37 (U/L)    ALT 10  0 - 53 (U/L)    Alkaline Phosphatase 71  39 - 117 (U/L)    Total Bilirubin  0.3  0.3 - 1.2 (mg/dL)    GFR calc non Af Amer 22 (*) >90 (mL/min)    GFR calc Af Amer 26 (*) >90 (mL/min)   PROTIME-INR     Status: Normal   Collection Time   10/18/11  1:50 PM      Component Value Range Comment   Prothrombin Time 13.6  11.6 - 15.2 (seconds)    INR 1.02  0.00 - 1.49    RETICULOCYTES     Status: Abnormal   Collection Time   10/18/11  1:50 PM      Component Value Range Comment   Retic Ct Pct 1.5  0.4 - 3.1 (%)    RBC. 2.50 (*) 4.22 - 5.81 (MIL/uL)    Retic Count, Manual 37.5  19.0 - 186.0 (K/uL)   TYPE AND SCREEN     Status: Normal   Collection Time   10/18/11  1:57 PM      Component Value Range Comment   ABO/RH(D) A POS      Antibody Screen POS      Sample Expiration 10/21/2011      Dg Chest 2 View  10/18/2011  *RADIOLOGY REPORT*  Clinical Data: Short of breath  CHEST - 2 VIEW  Comparison: 09/03/2011  Findings: Heart size is normal.  There is a small to moderate right pleural effusion which is unchanged from prior study.  The left lung is clear.  No airspace consolidation.  No interstitial edema.  IMPRESSION:  1.  No change in right pleural effusion.  Original Report Authenticated By: Angelita Ingles, M.D.    Assessment: Dark stools with anemia suspicious for upper GI bleeding in a patient on Plavix and aspirin Plan: Transfuse 1 unit packed red blood cells tonight, EGD tomorrow  Jordyn Doane C 10/18/2011, 3:22 PM

## 2011-10-18 NOTE — ED Notes (Signed)
Pt presents with 3 day h/o R sided chest pain.  Pt reports pain worsens when he lays down, pt reports pain will lessen when he stands up.  +shortness of breath especially with exertion. Pt seen at Dr. Marlou Porch office - page when pt gets to room at [phone].  Pt also reports 2 day h/o black stools.

## 2011-10-18 NOTE — ED Notes (Signed)
Urinal given to patient.

## 2011-10-18 NOTE — H&P (Addendum)
Robert [name] is an 78 y.o. male.   Chief Complaint: Shortness of breath  HPI: Has been experiencing shortness of breath over the past few days that has been unrelieved with increasing Lasix. He states that it is worse at night when he lays down and is often accompanied by right lower chest discomfort. This is the same side as the small chronic right pleural effusion. This happens about 5 minutes after laying down. Both he and his wife have been awake because of it. When he lays at a 30 degree angle, he is fine. He also states that he has been experiencing increased dyspnea and fatigue with walking, moderate in severity, relieved with rest.  He is a history of severe mitral regurgitation currently medically managed which was assessed with echocardiogram May of last year. After his MI, Dr. Cyndia Bent reviewed his case and discussed with Dr. [name]. Medical management,. Prior inferior/posterior ST elevation myocardial infarction with a circumflex DES at that time. His overall ejection fraction is in the 45% range.  Past Medical History  Diagnosis Date  . Prostate cancer   . CAD (coronary artery disease), native coronary artery   . Acute MI inferior posterior subsequent episode care   . Severe mitral regurgitation   . Type II or unspecified type diabetes mellitus with renal manifestations, not stated as uncontrolled   . PVD (peripheral vascular disease) 39% bilateral carotids   Current Medications  Ocuvite Tablet 1 tab once a day   Multivitamins Tablet 1 tab qd   Fish Oil 1000 MG Capsule 1 capsule Once a day   Pravastatin Sodium 80 MG Tablet 1 tablet Once a day   Humalog Pen 100 UNIT/ML Solution as directed sliding scale   Pen Needles 5/16" 31G X 8 MM Miscellaneous as directed use as directed   Aspirin 325 mg 325 mg tablet 1 tablet daily   Metoprolol Tartrate 25 MG Tablet 1/2 tablet Once a day every evening   GlipiZIDE 10 MG Tablet Extended Release 24 Hour 1 tablet twice a day    Isosorbide Mononitrate 30 MG Tablet Extended Release 24 Hour 1/2 tablet Once a day   Alprazolam 0.25 MG Tablet 1-2 tablets Once a day prn anxiety   Furosemide 40 MG Tablet half tablet twice a day   Plavix 75 MG Tablet 1 tablet Once a day   Pantoprazole Sodium 40 MG Tablet Delayed Release 1 tablet Once a day   Medication List reviewed and reconciled with the patient      Family history is currently noncontributory Social History:  does not have a smoking history on file. He does not have any smokeless tobacco history on file. His alcohol and drug histories not on file.  Allergies: No Known Allergies  Medications Prior to Admission  Medication Dose Route Frequency Provider Last Rate Last Dose  . aspirin tablet 325 mg  325 mg Oral STAT Nathan R. Pickering, MD      . nitroGLYCERIN (NITROSTAT) SL tablet 0.4 mg  0.4 mg Sublingual Q5 min PRN Jasper Riling. Alvino Chapel, MD       No current outpatient prescriptions on file as of 10/18/2011.    Denies any recent fevers, syncope, bleeding, rashes, joint pain. Unless stated above all other 12 review of systems negative. Blood pressure 125/53, pulse 90, resp. rate 20, height 6\' 1"  (1.854 m), weight 72.576 kg (160 lb), SpO2 100.00%.  General Examination: GENERAL APPEARANCE alert, oriented, NAD, pleasant.  SKIN: normal, no rash.  HEAD: [place]/AT.  EYES: EOMI, Conjunctiva clear,  no drainage.  NECK: supple, no bruits, no JVD.  LUNGS: clear to auscultation bilaterally, no wheezes, rhonchi, rales. Mild decreased BS at the right lung base.   HEART: regular rate and rhythm, normal [id number], II/VI systolic murmur.  ABDOMEN: soft and not tender, non-distended, no masses palpated.  BACK: unremarkable.  EXTREMITIES: no edema.  NEUROLOGIC EXAM: non-focal exam.  PSYCH appropriate mood and affect .     Chest x-ray personally viewed shows mild/small right-sided pleural effusion otherwise unremarkable.  ECG: NSR, NSSTW changes, PVC's. No significant change  from prior. HR 95bpm.   Assessment/Plan  78 year old male with severe mitral regurgitation, mildly reduced left ventricular systolic function, chronic kidney disease, prior prostate cancer, coronary artery disease status post inferior/posterior myocardial infarction in 2012 here with worsening dyspnea consistent with acute heart failure/ anemia.   Heart failure - I will go ahead and place on IV Lasix with close monitoring of hemodynamics. I will also closely monitor his creatinine given its elevation. BP is also low normal. Hopefully this will help with orthopnea as well as DOE. NYHA class 3. Will check an echocardiogram. BNP. TSH. CBC, CMET.   Mitral regurgitation - Severe. Medical management. See HPI. Lasix. Prior mild LA enlargement. This is likely playing a role in his symptoms.   Chest pain - when laying down may be secondary to pleural inflammation in the setting of chronic small right pleural effusion. Will monitor. Afebrile. No leukocytosis. Trop negative.   Anemia - Hg 7. MCV 81. This is also playing a significant role in dyspnea as well. Prior was 9.9 on 02/02/11 in the setting of MI. I will consult hospitalist since Dr. Rex Kras does not round at the hospital. May need one unit transfusion especially given his CAD. I will stop Plavix and continue with low dose ASA.   DM - ISS with home meds.   PVD - carotid disease mild and stable.  CAD - no ECG changes. Prior Circ DES last year. RCA before that. Continue Imdur.  CKD  - Creat was 2.2 last year. ?playing a role in anemia.   SKAINS, [place] 10/18/2011, 11:44 AM   After seeing anemia on CBC, I questioned him further and he admits to black stool over the past 3 days. Said he had colonoscopy about a year ago and was normal per Dr. Penelope Coop. Will go ahead and consult [facility] to evaluate. I am going to hold ASA and Plavix given history of melena.   Adden: Acute systolic heart failure from severe MR. CKD stage 4.

## 2011-10-18 NOTE — ED Notes (Signed)
Pt reports he spoke with his cardiologist on Monday and was told to increase his lasix from a half dose to a whole dose twice a day for Tuesday and Wednesday. Pt reports poor urine out put last night.

## 2011-10-18 NOTE — ED Notes (Signed)
After giving insulin and pt happy meal spoke with dr Marlou Porch regarding his GI consult. Dr Marlou Porch stated to make pt NPO. Pt had approx 5 pretzels and 3 drinks of diet sprite. Pt is aware of change of orders and understands. Dr Marlou Porch is aware insulin was given.

## 2011-10-18 NOTE — Progress Notes (Signed)
After speaking with Robert [name] of GI, I decided to go ahead and transfuse one unit of blood. Will give lasix 20mg  IV after unit. I will hold off from giving other doses of IV lasix.

## 2011-10-18 NOTE — ED Notes (Signed)
[id number] Ready

## 2011-10-18 NOTE — ED Notes (Signed)
Dr. Marlou Porch is aware of pt, would like to continue with blood work and chest xray. Will come see pt.

## 2011-10-18 NOTE — ED Notes (Signed)
Pt and family aware of poc. Pt with no complaints, per GI pt okayed to eat, pt aware

## 2011-10-19 ENCOUNTER — Encounter (HOSPITAL_COMMUNITY): Payer: Self-pay | Admitting: *Deleted

## 2011-10-19 ENCOUNTER — Encounter (HOSPITAL_COMMUNITY)
Admission: EM | Disposition: A | Discharge status: Home / Self Care | Payer: Self-pay | Source: Home / Self Care | Attending: Interventional Cardiology

## 2011-10-19 ENCOUNTER — Other Ambulatory Visit: Payer: Self-pay | Admitting: Gastroenterology

## 2011-10-19 DIAGNOSIS — D131 Benign neoplasm of stomach: Secondary | ICD-10-CM | POA: Diagnosis not present

## 2011-10-19 DIAGNOSIS — D649 Anemia, unspecified: Secondary | ICD-10-CM | POA: Diagnosis not present

## 2011-10-19 DIAGNOSIS — I5023 Acute on chronic systolic (congestive) heart failure: Secondary | ICD-10-CM | POA: Diagnosis not present

## 2011-10-19 DIAGNOSIS — K259 Gastric ulcer, unspecified as acute or chronic, without hemorrhage or perforation: Secondary | ICD-10-CM | POA: Diagnosis not present

## 2011-10-19 DIAGNOSIS — I059 Rheumatic mitral valve disease, unspecified: Secondary | ICD-10-CM | POA: Diagnosis not present

## 2011-10-19 DIAGNOSIS — I2510 Atherosclerotic heart disease of native coronary artery without angina pectoris: Secondary | ICD-10-CM | POA: Diagnosis not present

## 2011-10-19 DIAGNOSIS — K921 Melena: Secondary | ICD-10-CM | POA: Diagnosis not present

## 2011-10-19 DIAGNOSIS — I509 Heart failure, unspecified: Secondary | ICD-10-CM | POA: Diagnosis not present

## 2011-10-19 DIAGNOSIS — K2970 Gastritis, unspecified, without bleeding: Secondary | ICD-10-CM | POA: Diagnosis not present

## 2011-10-19 HISTORY — PX: ESOPHAGOGASTRODUODENOSCOPY: SHX5428

## 2011-10-19 LAB — GLUCOSE, CAPILLARY
Glucose-Capillary: 229 mg/dL — ABNORMAL HIGH (ref 70–99)
Glucose-Capillary: 300 mg/dL — ABNORMAL HIGH (ref 70–99)

## 2011-10-19 LAB — CBC
Hemoglobin: 7 g/dL — ABNORMAL LOW (ref 13.0–17.0)
MCH: 25.3 pg — ABNORMAL LOW (ref 26.0–34.0)
MCV: 81.2 fL (ref 78.0–100.0)
Platelets: 232 10*3/uL (ref 150–400)
RBC: 2.77 MIL/uL — ABNORMAL LOW (ref 4.22–5.81)

## 2011-10-19 LAB — PREPARE RBC (CROSSMATCH)

## 2011-10-19 SURGERY — EGD (ESOPHAGOGASTRODUODENOSCOPY)
Anesthesia: Moderate Sedation

## 2011-10-19 MED ORDER — ZOLPIDEM TARTRATE 5 MG PO TABS
5.0000 mg | ORAL_TABLET | Freq: Every evening | ORAL | Status: DC | PRN
  Administered 2011-10-19 – 2011-10-20 (×3): 5 mg via ORAL
  Filled 2011-10-19 (×3): qty 1

## 2011-10-19 MED ORDER — FENTANYL CITRATE 0.05 MG/ML IJ SOLN
INTRAMUSCULAR | Status: DC | PRN
  Administered 2011-10-19 (×2): 25 ug via INTRAVENOUS

## 2011-10-19 MED ORDER — MIDAZOLAM HCL 10 MG/2ML IJ SOLN
INTRAMUSCULAR | Status: AC
  Filled 2011-10-19: qty 2

## 2011-10-19 MED ORDER — MIDAZOLAM HCL 10 MG/2ML IJ SOLN
INTRAMUSCULAR | Status: DC | PRN
  Administered 2011-10-19 (×2): 2 mg via INTRAVENOUS

## 2011-10-19 MED ORDER — ACETAMINOPHEN 325 MG PO TABS
650.0000 mg | ORAL_TABLET | Freq: Once | ORAL | Status: AC
  Administered 2011-10-19: 650 mg via ORAL

## 2011-10-19 MED ORDER — FUROSEMIDE 10 MG/ML IJ SOLN: 40.0000 mg | Freq: Once | INTRAMUSCULAR | Status: DC

## 2011-10-19 MED ORDER — FENTANYL CITRATE 0.05 MG/ML IJ SOLN
INTRAMUSCULAR | Status: AC
  Filled 2011-10-19: qty 2

## 2011-10-19 MED ORDER — FUROSEMIDE 10 MG/ML IJ SOLN
40.0000 mg | Freq: Once | INTRAMUSCULAR | Status: AC
  Administered 2011-10-19: 40 mg via INTRAVENOUS
  Filled 2011-10-19: qty 4

## 2011-10-19 MED ORDER — SODIUM CHLORIDE 0.9 % IV SOLN: Freq: Once | INTRAVENOUS | Status: DC

## 2011-10-19 NOTE — Clinical Documentation Improvement (Signed)
CHF DOCUMENTATION CLARIFICATION QUERY  THIS DOCUMENT IS NOT A PERMANENT PART OF THE MEDICAL RECORD  Please update your documentation within the medical record to reflect your response to this query.                                                                                     10/19/11  Dr. Marlou Porch,  In a better effort to capture your patient's severity of illness, reflect appropriate length of stay and utilization of resources, a review of the patient medical record has revealed the following indicators the diagnosis of Heart Failure.    Based on your clinical judgment, please document the TYPE of Heart Failure in the progress notes and discharge summary:  - Systolic  - Diastolic  - Combined  - Other TYPE  - Unable to Clinically Determine   Clinical Information:   "78 year old male with severe mitral regurgitation, mildly reduced left ventricular systolic function, chronic kidney disease, prior prostate cancer, coronary artery disease status post inferior/posterior myocardial infarction in 2012 here with worsening dyspnea consistent with acute heart failure/ anemia." Tawona Filsinger 10/18/2011, 11:44 AM   In responding to this query please exercise your independent judgment.  The fact that a query is asked, does not imply that any particular answer is desired or expected.   Reviewed: additional documentation in the medical record  Thank You,  Erling Conte  RN BSN Certified Clinical Documentation Specialist: [facility]   [place]:  1. If needed, update documentation for the patient's encounter via the notes activity.  2. Access this query again and click edit on the [employer].  3. After updating, or not, click F2 to complete all highlighted (required) fields concerning your review. Select "additional documentation in the medical record" OR "no additional  documentation provided".  4. Click Sign note button.  5. The deficiency will fall out of your In Basket *Please let us know if you are not able to complete this workflow by phone or e-mail (listed below).

## 2011-10-19 NOTE — Brief Op Note (Signed)
10/18/2011 - 10/19/2011  10:16 AM  PATIENT:  Robert [name]  78 y.o. male  PRE-OPERATIVE DIAGNOSIS:  Gi bleed  POST-OPERATIVE DIAGNOSIS:  gastric nodule  PROCEDURE:  Procedure(s): ESOPHAGOGASTRODUODENOSCOPY (EGD)  SURGEON:  Surgeon(s): [name], MD  EGD shows one small antral nodule with no stigmata of hemorrhage otherwise normal. Please see official procedure report for details. Will monitor stools and hemoglobin and consider whether  he needs a colonoscopy

## 2011-10-19 NOTE — Progress Notes (Signed)
[facility] Progress Note  Subjective: Feels fine today, no further stools  Objective: Vital signs in last 24 hours: Temp:  [97.2 F (36.2 C)-98.4 F (36.9 C)] 97.2 F (36.2 C) (01/25 0522) Pulse Rate:  [59-97] 97  (01/25 0921) Resp:  [17-26] 26  (01/25 1015) BP: (84-125)/(42-68) 84/48 mmHg (01/25 1015) SpO2:  [97 %-100 %] 100 % (01/25 1015) Weight:  [72.576 kg (160 lb)-76.1 kg (167 lb 12.3 oz)] 76.1 kg (167 lb 12.3 oz) (01/25 0522) Weight change:    PE: No change  Lab Results: Results for orders placed during the hospital encounter of 10/18/11 (from the past 24 hour(s))  CBC     Status: Abnormal   Collection Time   10/18/11 11:25 AM      Component Value Range   WBC 9.2  4.0 - 10.5 (K/uL)   RBC 2.77 (*) 4.22 - 5.81 (MIL/uL)   Hemoglobin 7.0 (*) 13.0 - 17.0 (g/dL)   HCT 22.7 (*) 39.0 - 52.0 (%)   MCV 81.9  78.0 - 100.0 (fL)   MCH 25.3 (*) 26.0 - 34.0 (pg)   MCHC 30.8  30.0 - 36.0 (g/dL)   RDW 19.0 (*) 11.5 - 15.5 (%)   Platelets 302  150 - 400 (K/uL)  BASIC METABOLIC PANEL     Status: Abnormal   Collection Time   10/18/11 11:25 AM      Component Value Range   Sodium 138  135 - 145 (mEq/L)   Potassium 3.8  3.5 - 5.1 (mEq/L)   Chloride 100  96 - 112 (mEq/L)   CO2 24  19 - 32 (mEq/L)   Glucose, Bld 306 (*) 70 - 99 (mg/dL)   BUN 68 (*) 6 - 23 (mg/dL)   Creatinine, Ser 2.63 (*) 0.50 - 1.35 (mg/dL)   Calcium 9.5  8.4 - 10.5 (mg/dL)   GFR calc non Af Amer 21 (*) >90 (mL/min)   GFR calc Af Amer 25 (*) >90 (mL/min)  PRO B NATRIURETIC PEPTIDE     Status: Abnormal   Collection Time   10/18/11 11:26 AM      Component Value Range   Pro B Natriuretic peptide (BNP) 2821.0 (*) 0 - 450 (pg/mL)  POCT I-STAT TROPONIN I     Status: Normal   Collection Time   10/18/11 11:47 AM      Component Value Range   Troponin i, poc 0.03  0.00 - 0.08 (ng/mL)   Comment 3           PRO B NATRIURETIC PEPTIDE     Status: Abnormal   Collection Time   10/18/11  1:17 PM      Component  Value Range   Pro B Natriuretic peptide (BNP) 2984.0 (*) 0 - 450 (pg/mL)  GLUCOSE, CAPILLARY     Status: Abnormal   Collection Time   10/18/11  1:31 PM      Component Value Range   Glucose-Capillary 231 (*) 70 - 99 (mg/dL)  COMPREHENSIVE METABOLIC PANEL     Status: Abnormal   Collection Time   10/18/11  1:50 PM      Component Value Range   Sodium 138  135 - 145 (mEq/L)   Potassium 3.5  3.5 - 5.1 (mEq/L)   Chloride 101  96 - 112 (mEq/L)   CO2 25  19 - 32 (mEq/L)   Glucose, Bld 234 (*) 70 - 99 (mg/dL)   BUN 69 (*) 6 - 23 (mg/dL)   Creatinine, Ser 2.54 (*) 0.50 -  1.35 (mg/dL)   Calcium 9.0  8.4 - 10.5 (mg/dL)   Total Protein 6.6  6.0 - 8.3 (g/dL)   Albumin 3.4 (*) 3.5 - 5.2 (g/dL)   AST 13  0 - 37 (U/L)   ALT 10  0 - 53 (U/L)   Alkaline Phosphatase 71  39 - 117 (U/L)   Total Bilirubin 0.3  0.3 - 1.2 (mg/dL)   GFR calc non Af Amer 22 (*) >90 (mL/min)   GFR calc Af Amer 26 (*) >90 (mL/min)  TSH     Status: Normal   Collection Time   10/18/11  1:50 PM      Component Value Range   TSH 1.982  0.350 - 4.500 (uIU/mL)  PROTIME-INR     Status: Normal   Collection Time   10/18/11  1:50 PM      Component Value Range   Prothrombin Time 13.6  11.6 - 15.2 (seconds)   INR 1.02  0.00 - 1.49   VITAMIN B12     Status: Normal   Collection Time   10/18/11  1:50 PM      Component Value Range   Vitamin B-12 590  211 - 911 (pg/mL)  FOLATE     Status: Normal   Collection Time   10/18/11  1:50 PM      Component Value Range   Folate >20.0    IRON AND TIBC     Status: Abnormal   Collection Time   10/18/11  1:50 PM      Component Value Range   Iron 18 (*) 42 - 135 (ug/dL)   TIBC 360  215 - 435 (ug/dL)   Saturation Ratios 5 (*) 20 - 55 (%)   UIBC 342  125 - 400 (ug/dL)  FERRITIN     Status: Abnormal   Collection Time   10/18/11  1:50 PM      Component Value Range   Ferritin 8 (*) 22 - 322 (ng/mL)  RETICULOCYTES     Status: Abnormal   Collection Time   10/18/11  1:50 PM      Component Value  Range   Retic Ct Pct 1.5  0.4 - 3.1 (%)   RBC. 2.50 (*) 4.22 - 5.81 (MIL/uL)   Retic Count, Manual 37.5  19.0 - 186.0 (K/uL)  HEMOGLOBIN A1C     Status: Abnormal   Collection Time   10/18/11  1:50 PM      Component Value Range   Hemoglobin A1C 7.6 (*) <5.7 (%)   Mean Plasma Glucose 171 (*) <117 (mg/dL)  TYPE AND SCREEN     Status: Normal   Collection Time   10/18/11  1:57 PM      Component Value Range   ABO/RH(D) A POS     Antibody Screen POS     Sample Expiration 10/21/2011     DAT, IgG NEG     Antibody Identification ANTI-K     Unit Number [id number]     Blood Component Type RED CELLS,LR     Unit division 00     Status of Unit ISSUED,FINAL     Donor AG Type NEGATIVE FOR KELL ANTIGEN     Transfusion Status OK TO TRANSFUSE     Crossmatch Result COMPATIBLE    PREPARE RBC (CROSSMATCH)     Status: Normal   Collection Time   10/18/11  1:57 PM      Component Value Range   Order Confirmation ORDER PROCESSED BY BLOOD BANK  CARDIAC PANEL(CRET KIN+CKTOT+MB+TROPI)     Status: Normal   Collection Time   10/18/11  4:15 PM      Component Value Range   Total CK 96  7 - 232 (U/L)   CK, MB 3.1  0.3 - 4.0 (ng/mL)   Troponin I <0.30  <0.30 (ng/mL)   Relative Index RELATIVE INDEX IS INVALID  0.0 - 2.5   GLUCOSE, CAPILLARY     Status: Abnormal   Collection Time   10/18/11  4:46 PM      Component Value Range   Glucose-Capillary 201 (*) 70 - 99 (mg/dL)   Comment 1 Notify RN    GLUCOSE, CAPILLARY     Status: Abnormal   Collection Time   10/18/11  9:30 PM      Component Value Range   Glucose-Capillary 323 (*) 70 - 99 (mg/dL)   Comment 1 Notify RN    CARDIAC PANEL(CRET KIN+CKTOT+MB+TROPI)     Status: Normal   Collection Time   10/19/11 12:04 AM      Component Value Range   Total CK 90  7 - 232 (U/L)   CK, MB 2.7  0.3 - 4.0 (ng/mL)   Troponin I <0.30  <0.30 (ng/mL)   Relative Index RELATIVE INDEX IS INVALID  0.0 - 2.5   CBC     Status: Abnormal   Collection Time   10/19/11  6:20 AM       Component Value Range   WBC 5.5  4.0 - 10.5 (K/uL)   RBC 2.77 (*) 4.22 - 5.81 (MIL/uL)   Hemoglobin 7.0 (*) 13.0 - 17.0 (g/dL)   HCT 22.5 (*) 39.0 - 52.0 (%)   MCV 81.2  78.0 - 100.0 (fL)   MCH 25.3 (*) 26.0 - 34.0 (pg)   MCHC 31.1  30.0 - 36.0 (g/dL)   RDW 19.4 (*) 11.5 - 15.5 (%)   Platelets 232  150 - 400 (K/uL)  GLUCOSE, CAPILLARY     Status: Abnormal   Collection Time   10/19/11  6:31 AM      Component Value Range   Glucose-Capillary 167 (*) 70 - 99 (mg/dL)   Comment 1 Notify RN      Studies/Results: Dg Chest 2 View  10/18/2011  *RADIOLOGY REPORT*  Clinical Data: Short of breath  CHEST - 2 VIEW  Comparison: 09/03/2011  Findings: Heart size is normal.  There is a small to moderate right pleural effusion which is unchanged from prior study.  The left lung is clear.  No airspace consolidation.  No interstitial edema.  IMPRESSION:  1.  No change in right pleural effusion.  Original Report Authenticated By: Angelita Ingles, M.D.    EGD shows small antral nodule with no stigmata of hemorrhage, no other likely bleeding source is seen.    Assessment: Black stools with anemia, no likely upper GI bleeding source an EGD  Plan: Will Hemoccult stools to confirm heme positivity, await biopsy results and review when his last colonoscopy was to see if this needs to be repeated.    Dejanay Wamboldt C 10/19/2011, 10:18 AM

## 2011-10-19 NOTE — Progress Notes (Signed)
SUBJECTIVE:  Tolerated EGD.  Unable to lie flat.  No chest pain.  OBJECTIVE:   Vitals:   Filed Vitals:   10/19/11 1021 10/19/11 1030 10/19/11 1040 10/19/11 1520  BP: 95/50 95/50 105/52 116/71  Pulse:    60  Temp:    97.7 F (36.5 C)  TempSrc:      Resp: 23 43 44 20  Height:      Weight:      SpO2: 100% 99% 97% 97%   I&O's:   Intake/Output Summary (Last 24 hours) at 10/19/11 1610 Last data filed at 10/19/11 1300  Gross per 24 hour  Intake    840 ml  Output   1200 ml  Net   -360 ml   TELEMETRY: Reviewed telemetry pt in NSR:     PHYSICAL EXAM General: Well developed, well nourished, in no acute distress Head:   Normal cephalic and atraumatic  Lungs:   Clear bilaterally to auscultation and percussion. Heart:  HRRR S1 S2 ; systolic murmur Abdomen:  abdomen soft   Extremities:   No clubbing, cyanosis or edema.  DP +1 Neuro: Alert and oriented X 3. Psych:  Good affect, responds appropriately   LABS: Basic Metabolic Panel:  Basename 10/18/11 1350 10/18/11 1125  NA 138 138  K 3.5 3.8  CL 101 100  CO2 25 24  GLUCOSE 234* 306*  BUN 69* 68*  CREATININE 2.54* 2.63*  CALCIUM 9.0 9.5  MG -- --  PHOS -- --   Liver Function Tests:  Basename 10/18/11 1350  AST 13  ALT 10  ALKPHOS 71  BILITOT 0.3  PROT 6.6  ALBUMIN 3.4*   No results found for this basename: LIPASE:2,AMYLASE:2 in the last 72 hours CBC:  Basename 10/19/11 0620 10/18/11 1125  WBC 5.5 9.2  NEUTROABS -- --  HGB 7.0* 7.0*  HCT 22.5* 22.7*  MCV 81.2 81.9  PLT 232 302   Cardiac Enzymes:  Basename 10/19/11 0004 10/18/11 1615  CKTOTAL 90 96  CKMB 2.7 3.1  CKMBINDEX -- --  TROPONINI <0.30 <0.30   BNP: No components found with this basename: POCBNP:3 D-Dimer: No results found for this basename: DDIMER:2 in the last 72 hours Hemoglobin A1C:  Basename 10/18/11 1350  HGBA1C 7.6*   Fasting Lipid Panel: No results found for this basename: CHOL,HDL,LDLCALC,TRIG,CHOLHDL,LDLDIRECT in the last  72 hours Thyroid Function Tests:  Basename 10/18/11 1350  TSH 1.982  T4TOTAL --  T3FREE --  THYROIDAB --   Anemia Panel:  Basename 10/18/11 1350  VITAMINB12 590  FOLATE >20.0  FERRITIN 8*  TIBC 360  IRON 18*  RETICCTPCT 1.5   Coag Panel:   Lab Results  Component Value Date   INR 1.02 10/18/2011   INR >10.00 REPEATED TO VERIFY SPECIMEN CHECKED FOR CLOTS CRITICAL RESULT CALLED TO, READ BACK BY AND VERIFIED WITH: G TURNER,RN 01/21/11 AT 0318 BY J HICKS* 01/21/2011    RADIOLOGY: Dg Chest 2 View  10/18/2011  *RADIOLOGY REPORT*  Clinical Data: Short of breath  CHEST - 2 VIEW  Comparison: 09/03/2011  Findings: Heart size is normal.  There is a small to moderate right pleural effusion which is unchanged from prior study.  The left lung is clear.  No airspace consolidation.  No interstitial edema.  IMPRESSION:  1.  No change in right pleural effusion.  Original Report Authenticated By: Angelita Ingles, M.D.      ASSESSMENT: CAD, MI, mitral regurgitation  PLAN:  S/p EGD with no source.  Hbg did not  increase with transfusion.  Will tranfuse another unit.  May need colonoscopy.  Cardiac issues are unchanged from previous.  Severe mitral regurgitation.  Normal CVP by echo.  I think Shortness of breath is coming more from anemia.    CRI. FOllow creatinine.  Jettie Booze., MD  10/19/2011  4:10 PM

## 2011-10-19 NOTE — Clinical Documentation Improvement (Signed)
CKD DOCUMENTATION CLARIFICATION QUERY   THIS DOCUMENT IS NOT A PERMANENT PART OF THE MEDICAL RECORD   Please update your documentation within the medical record to reflect your response to this query.                                                                                         10/19/11   Dr. Marlou Porch or Associates,  In a better effort to capture your patient's severity of illness, reflect appropriate length of stay and utilization of resources, a review of the patient medical record has revealed the following indicators.    Based on your clinical judgment, please document the STAGE of the patient's CKD in the progress notes and discharge summary:  - CKD Stage III - GFR 30-59  - CKD Stage IV - GFR 15-29  - CKD Stage V - GFR < 15  - Other Condition (please specify)  - Unable to Clinically Determine   Clinical Information:   "CKD - Creat was 2.2 last year. ? playing a role in anemia."  Mckensi Redinger 10/18/2011, 11:44 AM   BUN/Cr/GFR this admission (wm) 68/2.63/21 69/2.54/22  BUN/Cr/GFR 01/2010 (wm) 36/2.20/29 40/2.26/28 41/2.60/no data   In responding to this query please exercise your independent judgment.  The fact that a query is asked, does not imply that any particular answer is desired or expected.   Reviewed: additional documentation in the medical record   Thank You,  Erling Conte  RN BSN Certified Clinical Documentation Specialist: Cell   [place]:  1. If needed, update documentation for the patient's encounter via the notes activity.  2. Access this query again and click edit on the [employer] [place] [place].  3. After updating, or not, click F2 to complete all highlighted (required) fields concerning your review. Select "additional documentation in the medical record" OR "no additional documentation provided".  4. Click Sign note button.   5. The deficiency will fall out of your In Basket *Please let us know if you are not able to complete this workflow by phone or e-mail (listed below). Made an addendum to H&P

## 2011-10-19 NOTE — Progress Notes (Signed)
10/19/11 1400 UR Completed. Llana Aliment, RN, BSN

## 2011-10-19 NOTE — Progress Notes (Signed)
  Echocardiogram 2D Echocardiogram has been performed.  Robert [name] 10/19/2011, 11:57 AM

## 2011-10-19 NOTE — Op Note (Signed)
[facility] [place] [facility], [place]  [id number]  ENDOSCOPY PROCEDURE REPORT  PATIENT:  [name], Robert  MR#:  [id number] BIRTHDATE:  June 17, 1928, 83 yrs. old  GENDER:  male  ENDOSCOPIST:  Teena Irani Referred by:  PROCEDURE DATE:  10/19/2011 PROCEDURE: ASA CLASS: INDICATIONS:   black stools  MEDICATIONS: 50 mcg fentanyl, 4 mg Versed TOPICAL ANESTHETIC: Cetacaine spray  DESCRIPTION OF PROCEDURE:   After the risks benefits and alternatives of the procedure were thoroughly explained, informed consent was obtained.  The [id number] endoscope was introduced through the mouth and advanced to the , without limitations.  The instrument was slowly withdrawn as the mucosa was fully examined. <<PROCEDUREIMAGES>>  FINDINGS: Small antral nodule of questionable significance, biopsied.  COMPLICATIONS:  None  ENDOSCOPIC IMPRESSION: Small antral nodule otherwise no likely source of upper GI bleeding  RECOMMENDATIONS: Await histology, monitor stools and hemoglobin and review colon cancer screening status  REPEAT EXAM:  ______________________________ Teena Irani  CC:  n. eSIGNED:   Teena Irani at 10/19/2011 10:16 AM  [name], [id number]

## 2011-10-20 DIAGNOSIS — D500 Iron deficiency anemia secondary to blood loss (chronic): Secondary | ICD-10-CM | POA: Diagnosis not present

## 2011-10-20 DIAGNOSIS — I059 Rheumatic mitral valve disease, unspecified: Secondary | ICD-10-CM | POA: Diagnosis not present

## 2011-10-20 DIAGNOSIS — I5021 Acute systolic (congestive) heart failure: Secondary | ICD-10-CM | POA: Diagnosis not present

## 2011-10-20 DIAGNOSIS — R1013 Epigastric pain: Secondary | ICD-10-CM | POA: Diagnosis not present

## 2011-10-20 DIAGNOSIS — I5023 Acute on chronic systolic (congestive) heart failure: Secondary | ICD-10-CM | POA: Diagnosis not present

## 2011-10-20 DIAGNOSIS — I2510 Atherosclerotic heart disease of native coronary artery without angina pectoris: Secondary | ICD-10-CM | POA: Diagnosis not present

## 2011-10-20 DIAGNOSIS — D649 Anemia, unspecified: Secondary | ICD-10-CM | POA: Diagnosis not present

## 2011-10-20 LAB — TYPE AND SCREEN
ABO/RH(D): A POS
DAT, IgG: NEGATIVE
Donor AG Type: NEGATIVE
Donor AG Type: NEGATIVE
Unit division: 0

## 2011-10-20 LAB — CBC
MCHC: 32 g/dL (ref 30.0–36.0)
RDW: 17.4 % — ABNORMAL HIGH (ref 11.5–15.5)
WBC: 5.5 10*3/uL (ref 4.0–10.5)

## 2011-10-20 LAB — BASIC METABOLIC PANEL
BUN: 48 mg/dL — ABNORMAL HIGH (ref 6–23)
Creatinine, Ser: 2.29 mg/dL — ABNORMAL HIGH (ref 0.50–1.35)
GFR calc Af Amer: 29 mL/min — ABNORMAL LOW (ref >90)
GFR calc non Af Amer: 25 mL/min — ABNORMAL LOW (ref >90)
Potassium: 3.8 mEq/L (ref 3.5–5.1)

## 2011-10-20 LAB — GLUCOSE, CAPILLARY: Glucose-Capillary: 111 mg/dL — ABNORMAL HIGH (ref 70–99)

## 2011-10-20 MED ORDER — CLOPIDOGREL BISULFATE 75 MG PO TABS
75.0000 mg | ORAL_TABLET | Freq: Every day | ORAL | Status: DC
  Administered 2011-10-20 – 2011-10-21 (×2): 75 mg via ORAL
  Filled 2011-10-20 (×3): qty 1

## 2011-10-20 NOTE — Progress Notes (Signed)
Robert [name] 11:47 AM  Subjective:  the patient is doing better without signs of bleeding in fact may be a little constipated and has no GI complaints  Objective: Vital signs stable afebrile abdomen is soft nontender hemoglobin increased after transfusion BUN and creatinine stable Assessment: GI bleed in a patient on aspirin and Plavix with nondiagnostic endoscopy  Plan: Will check the office computer to see his last colonoscopy report and reevaluate both aspirin and Plavix needs per cardiology and could possibly go home from our standpoint and followup in the office to discuss any further workup or plans otherwise will check on tomorrow and hopefully have above information at that time  [place] E

## 2011-10-20 NOTE — Progress Notes (Signed)
Subjective:  78 year old with iron def anemia recent melena by history with severe MR, SOB, EF 40%.  This am feels much better. No SOB. Ambulating well. Had small BM yesterday non melenotic. No bleeding.   No CP.   Objective:  Vital Signs in the last 24 hours: Temp:  [97.4 F (36.3 C)-98.6 F (37 C)] 97.8 F (36.6 C) (01/26 0531) Pulse Rate:  [60-86] 82  (01/26 0531) Resp:  [16-20] 20  (01/26 0531) BP: (94-116)/(48-71) 94/57 mmHg (01/26 0531) SpO2:  [93 %-100 %] 93 % (01/26 0531) Weight:  [73.3 kg (161 lb 9.6 oz)] 73.3 kg (161 lb 9.6 oz) (01/26 0531)  Intake/Output from previous day: 01/25 0701 - 01/26 0700 In: 2163 [P.O.:960; I.V.:503; Blood:700] Out: 2576 [Urine:2575; Stool:1]   Physical Exam: General: Well developed, well nourished, in no acute distress. Head:  Normocephalic and atraumatic. Lungs: Clear to auscultation and percussion. Heart: Normal S1 and S2.  No murmur, rubs or gallops.  Pulses: Pulses normal in all 4 extremities. Abdomen: soft, non-tender, positive bowel sounds. Extremities: No clubbing or cyanosis. No edema. Neurologic: Alert and oriented x 3.    Lab Results:  Basename 10/20/11 0450 10/19/11 0620  WBC 5.5 5.5  HGB 9.1* 7.0*  PLT 219 232    Basename 10/20/11 0450 10/18/11 1350  NA 139 138  K 3.8 3.5  CL 103 101  CO2 28 25  GLUCOSE 195* 234*  BUN 48* 69*  CREATININE 2.29* 2.54*    Basename 10/19/11 0004 10/18/11 1615  TROPONINI <0.30 <0.30   Hepatic Function Panel  Basename 10/18/11 1350  PROT 6.6  ALBUMIN 3.4*  AST 13  ALT 10  ALKPHOS 71  BILITOT 0.3  BILIDIR --  IBILI --    Telemetry: SR, PVC Personally viewed.   Cardiac Studies:  ECHO EF 40%, severe MR  Assessment/Plan:   Anemia - Iron def. 2 units prbc given. Stable Hg. EGD unremarkable.    Severe mitral regurgitation - echo this admit. No change.    Coronary atherosclerosis of native coronary artery - Was on both ASA and Plavix at home. I held past 2 days because  of bleed. After talking with Dr. Watt Climes, I will restart Plavix only. Stent in 5/12.    Acute systolic heart failure - technically high output failure from anemia. Feels better. BNP elevated. Gentle lasix. Low dose bb.   CKD 4 - Creat/BUN mild decrease.   Appreciate GI assistance.  Dispo: hopeful tomorrow.      Matas Burrows, [place] 10/20/2011, 12:07 PM

## 2011-10-21 DIAGNOSIS — K922 Gastrointestinal hemorrhage, unspecified: Secondary | ICD-10-CM | POA: Diagnosis not present

## 2011-10-21 DIAGNOSIS — D508 Other iron deficiency anemias: Secondary | ICD-10-CM | POA: Diagnosis not present

## 2011-10-21 DIAGNOSIS — I059 Rheumatic mitral valve disease, unspecified: Secondary | ICD-10-CM | POA: Diagnosis not present

## 2011-10-21 DIAGNOSIS — I5023 Acute on chronic systolic (congestive) heart failure: Secondary | ICD-10-CM | POA: Diagnosis not present

## 2011-10-21 LAB — BASIC METABOLIC PANEL
BUN: 39 mg/dL — ABNORMAL HIGH (ref 6–23)
Creatinine, Ser: 2.14 mg/dL — ABNORMAL HIGH (ref 0.50–1.35)
GFR calc non Af Amer: 27 mL/min — ABNORMAL LOW (ref >90)
Glucose, Bld: 203 mg/dL — ABNORMAL HIGH (ref 70–99)
Potassium: 4 mEq/L (ref 3.5–5.1)

## 2011-10-21 LAB — CBC
Hemoglobin: 9.5 g/dL — ABNORMAL LOW (ref 13.0–17.0)
MCH: 25.9 pg — ABNORMAL LOW (ref 26.0–34.0)
MCHC: 31.6 g/dL (ref 30.0–36.0)
RDW: 17.7 % — ABNORMAL HIGH (ref 11.5–15.5)

## 2011-10-21 LAB — GLUCOSE, CAPILLARY

## 2011-10-21 MED ORDER — CLOPIDOGREL BISULFATE 75 MG PO TABS: 75.0000 mg | ORAL_TABLET | Freq: Every day | ORAL | Status: AC

## 2011-10-21 NOTE — Discharge Summary (Signed)
Patient ID: Robert [name] MRN: [id number] DOB/AGE: [birth year] 78 y.o.  Admit date: 10/18/2011 Discharge date: 10/21/2011  Primary Discharge Diagnosis: GI bleed, new discovered iron deficiency anemia  Secondary Discharge Diagnosis: .  Prostate cancer  .  CAD (coronary artery disease), native coronary artery  .  Acute MI inferior posterior subsequent episode care  .  Severe mitral regurgitation  .  Type II or unspecified type diabetes mellitus with renal manifestations, not stated as uncontrolled  .  PVD (peripheral vascular disease)  39% bilateral carotids   Significant Diagnostic Studies:  EGD: ENDOSCOPIC IMPRESSION: Small antral nodule otherwise no likely  source of upper GI bleeding  RECOMMENDATIONS: Await histology, monitor stools and hemoglobin  and review colon cancer screening status  Consults: GI - Dr. Amedeo Plenty ([facility] prior colonoscopy)  Hospital Course: Has been experiencing shortness of breath over the past few days that has been unrelieved with increasing Lasix. He states that it is worse at night when he lays down and is often accompanied by right lower chest discomfort. This is the same side as the small chronic right pleural effusion. This happens about 5 minutes after laying down. Both he and his wife have been awake because of it. When he lays at a 30 degree angle, he is fine. He also states that he has been experiencing increased dyspnea and fatigue with walking, moderate in severity, relieved with rest.  He hass a history of severe mitral regurgitation currently medically managed which was assessed with echocardiogram May of last year. After his MI, Dr. Cyndia Bent reviewed his case and discussed with Dr. [name]. Medical management,. Prior inferior/posterior ST elevation myocardial infarction with a circumflex DES at that time. His overall ejection fraction is in the 45% range. His CBC was noteworthy for Hg 7 (prior 10).  GI called. EGD unremarkable. By history, black  stools past 3 days. After getting up, fell back hit marble table posterior shoulder at home (near syncope).  Stable Hg after 2 units PRBC. No BM here. No further bleeding signs.  Ambulating well. No CP. SOB improved after transfusions.  After discussion with Dr. Watt Climes, Plavix only (no ASA) was restarted yesterday. Prior DES 5/12 in setting of post MI. Resume ASA when able. Dr. Watt Climes and team to review colonoscopy.    Discharge Exam: Blood pressure 120/76, pulse 76, temperature 98.6 F (37 C), temperature source Oral, resp. rate 18, height 6\' 1"  (1.854 m), weight 73.8 kg (162 lb 11.2 oz), SpO2 96.00%.   GEN: AAO x 3 in NAD ABD: soft NT +BS CV: RRR, 3/6 HSM apex LUNGS: CTAB EXT: no edema  Labs:   Lab Results  Component Value Date   WBC 5.4 10/21/2011   HGB 9.5* 10/21/2011   HCT 30.1* 10/21/2011   MCV 82.0 10/21/2011   PLT 249 10/21/2011    Lab 10/21/11 0500 10/18/11 1350  NA 140 --  K 4.0 --  CL 106 --  CO2 28 --  BUN 39* --  CREATININE 2.14* --  CALCIUM 8.7 --  PROT -- 6.6  BILITOT -- 0.3  ALKPHOS -- 71  ALT -- 10  AST -- 13  GLUCOSE 203* --   Lab Results  Component Value Date   CKTOTAL 90 10/19/2011   CKMB 2.7 10/19/2011   TROPONINI <0.30 10/19/2011      FOLLOW UP PLANS AND APPOINTMENTS Discharge Orders    Future Appointments: Provider: Department: Dept Phone: Center:   07/16/2012 12:30 PM Hayden Pedro, MD [facility] [phone] None  Future Orders Please Complete By Expires   Diet - low sodium heart healthy      Diet - low sodium heart healthy      Increase activity slowly      Increase activity slowly        Medication List  As of 10/21/2011  7:35 AM   STOP taking these medications         aspirin 325 MG EC tablet      fish oil-omega-3 fatty acids 1000 MG capsule         TAKE these medications         beta carotene w/minerals tablet   Take 1 tablet by mouth daily.      clopidogrel 75 MG tablet   Commonly known as: PLAVIX   Take 1  tablet (75 mg total) by mouth daily with breakfast.      furosemide 20 MG tablet   Commonly known as: LASIX   Take 20 mg by mouth 2 (two) times daily.      glipiZIDE 5 MG tablet   Commonly known as: GLUCOTROL   Take 5 mg by mouth 2 (two) times daily before a meal.      insulin aspart 100 UNIT/ML injection   Commonly known as: novoLOG   Inject 0-10 Units into the skin 3 (three) times daily before meals. Uses as sliding scale insulin      metoprolol succinate 25 MG 24 hr tablet   Commonly known as: TOPROL-XL   Take 12.5 mg by mouth daily.      mulitivitamin with minerals Tabs   Take 1 tablet by mouth daily.      pantoprazole 40 MG tablet   Commonly known as: PROTONIX   Take 40 mg by mouth daily.           Follow-up Information    Follow up with Gennette Pac, MD in 1 week. (Check CBC. )       Follow up with Wonda Horner, MD. Schedule an appointment as soon as possible for a visit in 1 week.   Contact information:   [address], [address] [place] [phone]       Follow up with Jettie Booze., MD. (as scheduled)    Contact information:   [address] [place]          BRING ALL MEDICATIONS WITH YOU TO FOLLOW UP APPOINTMENTS  If bleeding occurs, call 911.   Time spent with patient to include physician time: 47min. Med rec. Lab review, discussion with patient.  SignedCandee Furbish 10/21/2011, 7:35 AM

## 2011-10-22 ENCOUNTER — Encounter (HOSPITAL_COMMUNITY): Payer: Self-pay | Admitting: Gastroenterology

## 2011-10-26 DIAGNOSIS — Z79899 Other long term (current) drug therapy: Secondary | ICD-10-CM | POA: Diagnosis not present

## 2011-11-07 DIAGNOSIS — R0602 Shortness of breath: Secondary | ICD-10-CM | POA: Diagnosis not present

## 2011-11-07 DIAGNOSIS — R799 Abnormal finding of blood chemistry, unspecified: Secondary | ICD-10-CM | POA: Diagnosis not present

## 2011-11-07 DIAGNOSIS — R7301 Impaired fasting glucose: Secondary | ICD-10-CM | POA: Diagnosis not present

## 2011-11-07 DIAGNOSIS — I2510 Atherosclerotic heart disease of native coronary artery without angina pectoris: Secondary | ICD-10-CM | POA: Diagnosis not present

## 2011-11-07 DIAGNOSIS — E1129 Type 2 diabetes mellitus with other diabetic kidney complication: Secondary | ICD-10-CM | POA: Diagnosis not present

## 2011-11-07 DIAGNOSIS — I10 Essential (primary) hypertension: Secondary | ICD-10-CM | POA: Diagnosis not present

## 2011-11-07 DIAGNOSIS — Z79899 Other long term (current) drug therapy: Secondary | ICD-10-CM | POA: Diagnosis not present

## 2011-11-07 DIAGNOSIS — I059 Rheumatic mitral valve disease, unspecified: Secondary | ICD-10-CM | POA: Diagnosis not present

## 2011-11-12 DIAGNOSIS — I252 Old myocardial infarction: Secondary | ICD-10-CM | POA: Diagnosis not present

## 2011-11-12 DIAGNOSIS — I059 Rheumatic mitral valve disease, unspecified: Secondary | ICD-10-CM | POA: Diagnosis not present

## 2011-11-12 DIAGNOSIS — D649 Anemia, unspecified: Secondary | ICD-10-CM | POA: Diagnosis not present

## 2011-11-13 DIAGNOSIS — R82998 Other abnormal findings in urine: Secondary | ICD-10-CM | POA: Diagnosis not present

## 2011-11-13 DIAGNOSIS — N189 Chronic kidney disease, unspecified: Secondary | ICD-10-CM | POA: Diagnosis not present

## 2011-11-13 DIAGNOSIS — I10 Essential (primary) hypertension: Secondary | ICD-10-CM | POA: Diagnosis not present

## 2011-11-13 DIAGNOSIS — E1129 Type 2 diabetes mellitus with other diabetic kidney complication: Secondary | ICD-10-CM | POA: Diagnosis not present

## 2011-11-16 DIAGNOSIS — I129 Hypertensive chronic kidney disease with stage 1 through stage 4 chronic kidney disease, or unspecified chronic kidney disease: Secondary | ICD-10-CM | POA: Diagnosis not present

## 2011-11-16 DIAGNOSIS — I10 Essential (primary) hypertension: Secondary | ICD-10-CM | POA: Diagnosis not present

## 2011-11-16 DIAGNOSIS — D649 Anemia, unspecified: Secondary | ICD-10-CM | POA: Diagnosis not present

## 2011-11-16 DIAGNOSIS — N2581 Secondary hyperparathyroidism of renal origin: Secondary | ICD-10-CM | POA: Diagnosis not present

## 2011-11-29 DIAGNOSIS — E7800 Pure hypercholesterolemia, unspecified: Secondary | ICD-10-CM | POA: Diagnosis not present

## 2011-11-29 DIAGNOSIS — Z79899 Other long term (current) drug therapy: Secondary | ICD-10-CM | POA: Diagnosis not present

## 2011-12-05 DIAGNOSIS — E7800 Pure hypercholesterolemia, unspecified: Secondary | ICD-10-CM | POA: Diagnosis not present

## 2011-12-25 DIAGNOSIS — I10 Essential (primary) hypertension: Secondary | ICD-10-CM | POA: Diagnosis not present

## 2012-01-01 ENCOUNTER — Other Ambulatory Visit: Payer: Self-pay | Admitting: Nephrology

## 2012-01-01 ENCOUNTER — Ambulatory Visit
Admission: RE | Admit: 2012-01-01 | Discharge: 2012-01-01 | Disposition: A | Discharge status: Home / Self Care | Payer: Medicare Other | Source: Ambulatory Visit | Attending: Nephrology | Admitting: Nephrology

## 2012-01-01 DIAGNOSIS — Z87442 Personal history of urinary calculi: Secondary | ICD-10-CM | POA: Diagnosis not present

## 2012-01-01 DIAGNOSIS — N189 Chronic kidney disease, unspecified: Secondary | ICD-10-CM | POA: Diagnosis not present

## 2012-01-01 DIAGNOSIS — D649 Anemia, unspecified: Secondary | ICD-10-CM | POA: Diagnosis not present

## 2012-01-11 DIAGNOSIS — R0602 Shortness of breath: Secondary | ICD-10-CM | POA: Diagnosis not present

## 2012-01-11 DIAGNOSIS — R5383 Other fatigue: Secondary | ICD-10-CM | POA: Diagnosis not present

## 2012-01-11 DIAGNOSIS — I252 Old myocardial infarction: Secondary | ICD-10-CM | POA: Diagnosis not present

## 2012-01-11 DIAGNOSIS — I059 Rheumatic mitral valve disease, unspecified: Secondary | ICD-10-CM | POA: Diagnosis not present

## 2012-01-11 DIAGNOSIS — R079 Chest pain, unspecified: Secondary | ICD-10-CM | POA: Diagnosis not present

## 2012-01-11 DIAGNOSIS — D649 Anemia, unspecified: Secondary | ICD-10-CM | POA: Diagnosis not present

## 2012-01-15 DIAGNOSIS — N2581 Secondary hyperparathyroidism of renal origin: Secondary | ICD-10-CM | POA: Diagnosis not present

## 2012-01-15 DIAGNOSIS — R5381 Other malaise: Secondary | ICD-10-CM | POA: Diagnosis not present

## 2012-01-15 DIAGNOSIS — I129 Hypertensive chronic kidney disease with stage 1 through stage 4 chronic kidney disease, or unspecified chronic kidney disease: Secondary | ICD-10-CM | POA: Diagnosis not present

## 2012-01-16 ENCOUNTER — Other Ambulatory Visit (HOSPITAL_COMMUNITY): Payer: Self-pay | Admitting: *Deleted

## 2012-01-21 ENCOUNTER — Encounter (HOSPITAL_COMMUNITY)
Admission: RE | Admit: 2012-01-21 | Discharge: 2012-01-21 | Disposition: A | Discharge status: Home / Self Care | Payer: Medicare Other | Source: Ambulatory Visit | Attending: Nephrology | Admitting: Nephrology

## 2012-01-21 DIAGNOSIS — N183 Chronic kidney disease, stage 3 unspecified: Secondary | ICD-10-CM | POA: Diagnosis not present

## 2012-01-21 DIAGNOSIS — D638 Anemia in other chronic diseases classified elsewhere: Secondary | ICD-10-CM | POA: Insufficient documentation

## 2012-01-21 LAB — IRON AND TIBC
Iron: 49 ug/dL (ref 42–135)
Saturation Ratios: 13 % — ABNORMAL LOW (ref 20–55)
UIBC: 321 ug/dL (ref 125–400)

## 2012-01-21 LAB — FERRITIN: Ferritin: 9 ng/mL — ABNORMAL LOW (ref 22–322)

## 2012-01-21 MED ORDER — EPOETIN ALFA 10000 UNIT/ML IJ SOLN: 10000.0000 [IU] | INTRAMUSCULAR | Status: DC

## 2012-01-21 MED ORDER — EPOETIN ALFA 10000 UNIT/ML IJ SOLN
INTRAMUSCULAR | Status: AC
  Administered 2012-01-21: 10000 [IU] via SUBCUTANEOUS
  Filled 2012-01-21: qty 1

## 2012-01-28 ENCOUNTER — Encounter (HOSPITAL_COMMUNITY)
Admission: RE | Admit: 2012-01-28 | Discharge: 2012-01-28 | Disposition: A | Discharge status: Home / Self Care | Payer: Medicare Other | Source: Ambulatory Visit | Attending: Nephrology | Admitting: Nephrology

## 2012-01-28 DIAGNOSIS — D638 Anemia in other chronic diseases classified elsewhere: Secondary | ICD-10-CM | POA: Diagnosis not present

## 2012-01-28 DIAGNOSIS — N183 Chronic kidney disease, stage 3 unspecified: Secondary | ICD-10-CM | POA: Insufficient documentation

## 2012-01-28 LAB — POCT HEMOGLOBIN-HEMACUE: Hemoglobin: 9.8 g/dL — ABNORMAL LOW (ref 13.0–17.0)

## 2012-01-28 MED ORDER — EPOETIN ALFA 10000 UNIT/ML IJ SOLN
INTRAMUSCULAR | Status: AC
  Administered 2012-01-28: 10000 [IU] via SUBCUTANEOUS
  Filled 2012-01-28: qty 1

## 2012-01-28 MED ORDER — EPOETIN ALFA 10000 UNIT/ML IJ SOLN
10000.0000 [IU] | INTRAMUSCULAR | Status: DC
  Administered 2012-01-28: 10000 [IU] via SUBCUTANEOUS

## 2012-02-04 ENCOUNTER — Encounter (HOSPITAL_COMMUNITY)
Admission: RE | Admit: 2012-02-04 | Discharge: 2012-02-04 | Disposition: A | Discharge status: Home / Self Care | Payer: Medicare Other | Source: Ambulatory Visit | Attending: Nephrology | Admitting: Nephrology

## 2012-02-04 MED ORDER — EPOETIN ALFA 10000 UNIT/ML IJ SOLN
10000.0000 [IU] | INTRAMUSCULAR | Status: DC
  Administered 2012-02-04: 10000 [IU] via SUBCUTANEOUS
  Filled 2012-02-04: qty 1

## 2012-02-08 ENCOUNTER — Other Ambulatory Visit (HOSPITAL_COMMUNITY): Payer: Self-pay | Admitting: *Deleted

## 2012-02-11 ENCOUNTER — Encounter (HOSPITAL_COMMUNITY)
Admission: RE | Admit: 2012-02-11 | Discharge: 2012-02-11 | Disposition: A | Discharge status: Home / Self Care | Payer: Medicare Other | Source: Ambulatory Visit | Attending: Nephrology | Admitting: Nephrology

## 2012-02-11 LAB — IRON AND TIBC
Saturation Ratios: 5 % — ABNORMAL LOW (ref 20–55)
TIBC: 376 ug/dL (ref 215–435)

## 2012-02-11 LAB — POCT HEMOGLOBIN-HEMACUE: Hemoglobin: 9.6 g/dL — ABNORMAL LOW (ref 13.0–17.0)

## 2012-02-11 LAB — FERRITIN: Ferritin: 8 ng/mL — ABNORMAL LOW (ref 22–322)

## 2012-02-11 MED ORDER — EPOETIN ALFA 20000 UNIT/ML IJ SOLN
20000.0000 [IU] | INTRAMUSCULAR | Status: DC
  Administered 2012-02-11: 20000 [IU] via SUBCUTANEOUS
  Filled 2012-02-11: qty 1

## 2012-02-19 ENCOUNTER — Encounter (HOSPITAL_COMMUNITY)
Admission: RE | Admit: 2012-02-19 | Discharge: 2012-02-19 | Disposition: A | Discharge status: Home / Self Care | Payer: Medicare Other | Source: Ambulatory Visit | Attending: Nephrology | Admitting: Nephrology

## 2012-02-19 ENCOUNTER — Encounter (HOSPITAL_COMMUNITY): Payer: Medicare Other

## 2012-02-19 MED ORDER — SODIUM CHLORIDE 0.9 % IV SOLN
INTRAVENOUS | Status: DC
  Administered 2012-02-19: 13:00:00 via INTRAVENOUS

## 2012-02-19 MED ORDER — FERUMOXYTOL INJECTION 510 MG/17 ML
510.0000 mg | INTRAVENOUS | Status: DC
  Administered 2012-02-19: 510 mg via INTRAVENOUS
  Filled 2012-02-19: qty 17

## 2012-02-19 MED ORDER — EPOETIN ALFA 20000 UNIT/ML IJ SOLN: 20000.0000 [IU] | INTRAMUSCULAR | Status: DC

## 2012-02-19 MED ORDER — EPOETIN ALFA 20000 UNIT/ML IJ SOLN
INTRAMUSCULAR | Status: AC
  Administered 2012-02-19: 20000 [IU] via SUBCUTANEOUS
  Filled 2012-02-19: qty 1

## 2012-02-22 DIAGNOSIS — I10 Essential (primary) hypertension: Secondary | ICD-10-CM | POA: Diagnosis not present

## 2012-02-22 DIAGNOSIS — IMO0001 Reserved for inherently not codable concepts without codable children: Secondary | ICD-10-CM | POA: Diagnosis not present

## 2012-02-26 ENCOUNTER — Other Ambulatory Visit (HOSPITAL_COMMUNITY): Payer: Self-pay | Admitting: *Deleted

## 2012-02-27 ENCOUNTER — Encounter (HOSPITAL_COMMUNITY): Payer: Medicare Other

## 2012-02-27 ENCOUNTER — Encounter (HOSPITAL_COMMUNITY)
Admission: RE | Admit: 2012-02-27 | Discharge: 2012-02-27 | Disposition: A | Discharge status: Home / Self Care | Payer: Medicare Other | Source: Ambulatory Visit | Attending: Nephrology | Admitting: Nephrology

## 2012-02-27 DIAGNOSIS — D638 Anemia in other chronic diseases classified elsewhere: Secondary | ICD-10-CM | POA: Diagnosis not present

## 2012-02-27 DIAGNOSIS — N183 Chronic kidney disease, stage 3 unspecified: Secondary | ICD-10-CM | POA: Insufficient documentation

## 2012-02-27 LAB — POCT HEMOGLOBIN-HEMACUE: Hemoglobin: 9.6 g/dL — ABNORMAL LOW (ref 13.0–17.0)

## 2012-02-27 MED ORDER — EPOETIN ALFA 20000 UNIT/ML IJ SOLN
20000.0000 [IU] | INTRAMUSCULAR | Status: DC
  Administered 2012-02-27: 20000 [IU] via SUBCUTANEOUS

## 2012-02-27 MED ORDER — FERUMOXYTOL INJECTION 510 MG/17 ML
510.0000 mg | INTRAVENOUS | Status: AC
  Administered 2012-02-27: 510 mg via INTRAVENOUS
  Filled 2012-02-27: qty 17

## 2012-02-27 MED ORDER — SODIUM CHLORIDE 0.9 % IV SOLN
Freq: Once | INTRAVENOUS | Status: AC
  Administered 2012-02-27: 09:00:00 via INTRAVENOUS

## 2012-02-27 MED ORDER — EPOETIN ALFA 20000 UNIT/ML IJ SOLN
INTRAMUSCULAR | Status: AC
  Filled 2012-02-27: qty 1

## 2012-03-03 ENCOUNTER — Encounter (HOSPITAL_COMMUNITY)
Admission: RE | Admit: 2012-03-03 | Discharge: 2012-03-03 | Disposition: A | Discharge status: Home / Self Care | Payer: Medicare Other | Source: Ambulatory Visit | Attending: Nephrology | Admitting: Nephrology

## 2012-03-03 DIAGNOSIS — D638 Anemia in other chronic diseases classified elsewhere: Secondary | ICD-10-CM | POA: Diagnosis not present

## 2012-03-03 LAB — POCT HEMOGLOBIN-HEMACUE: Hemoglobin: 10.5 g/dL — ABNORMAL LOW (ref 13.0–17.0)

## 2012-03-03 MED ORDER — EPOETIN ALFA 20000 UNIT/ML IJ SOLN
20000.0000 [IU] | INTRAMUSCULAR | Status: DC
  Administered 2012-03-03: 20000 [IU] via SUBCUTANEOUS
  Filled 2012-03-03: qty 1

## 2012-03-04 DIAGNOSIS — E119 Type 2 diabetes mellitus without complications: Secondary | ICD-10-CM | POA: Diagnosis not present

## 2012-03-10 ENCOUNTER — Encounter (HOSPITAL_COMMUNITY)
Admission: RE | Admit: 2012-03-10 | Discharge: 2012-03-10 | Disposition: A | Discharge status: Home / Self Care | Payer: Medicare Other | Source: Ambulatory Visit | Attending: Nephrology | Admitting: Nephrology

## 2012-03-10 DIAGNOSIS — D638 Anemia in other chronic diseases classified elsewhere: Secondary | ICD-10-CM | POA: Diagnosis not present

## 2012-03-10 LAB — IRON AND TIBC
Iron: 121 ug/dL (ref 42–135)
Saturation Ratios: 49 % (ref 20–55)
TIBC: 249 ug/dL (ref 215–435)
UIBC: 128 ug/dL (ref 125–400)

## 2012-03-10 LAB — FERRITIN: Ferritin: 239 ng/mL (ref 22–322)

## 2012-03-10 LAB — POCT HEMOGLOBIN-HEMACUE: Hemoglobin: 12.6 g/dL — ABNORMAL LOW (ref 13.0–17.0)

## 2012-03-10 MED ORDER — EPOETIN ALFA 20000 UNIT/ML IJ SOLN: 20000.0000 [IU] | INTRAMUSCULAR | Status: DC

## 2012-03-17 ENCOUNTER — Encounter (HOSPITAL_COMMUNITY): Payer: Medicare Other

## 2012-03-17 DIAGNOSIS — C61 Malignant neoplasm of prostate: Secondary | ICD-10-CM | POA: Diagnosis not present

## 2012-03-26 ENCOUNTER — Encounter (HOSPITAL_COMMUNITY)
Admission: RE | Admit: 2012-03-26 | Discharge: 2012-03-26 | Disposition: A | Discharge status: Home / Self Care | Payer: Medicare Other | Source: Ambulatory Visit | Attending: Nephrology | Admitting: Nephrology

## 2012-03-26 DIAGNOSIS — N183 Chronic kidney disease, stage 3 unspecified: Secondary | ICD-10-CM | POA: Diagnosis not present

## 2012-03-26 DIAGNOSIS — D638 Anemia in other chronic diseases classified elsewhere: Secondary | ICD-10-CM | POA: Insufficient documentation

## 2012-03-26 LAB — MAGNESIUM: Magnesium: 2.3 mg/dL (ref 1.5–2.5)

## 2012-03-26 LAB — RENAL FUNCTION PANEL
BUN: 40 mg/dL — ABNORMAL HIGH (ref 6–23)
CO2: 26 mEq/L (ref 19–32)
Calcium: 9.3 mg/dL (ref 8.4–10.5)
GFR calc Af Amer: 29 mL/min — ABNORMAL LOW (ref >90)
Glucose, Bld: 201 mg/dL — ABNORMAL HIGH (ref 70–99)
Phosphorus: 2.8 mg/dL (ref 2.3–4.6)
Sodium: 141 mEq/L (ref 135–145)

## 2012-03-26 MED ORDER — EPOETIN ALFA 20000 UNIT/ML IJ SOLN
20000.0000 [IU] | INTRAMUSCULAR | Status: DC
Start: 2012-03-26 — End: 2012-03-27

## 2012-04-09 ENCOUNTER — Encounter (HOSPITAL_COMMUNITY)
Admission: RE | Admit: 2012-04-09 | Discharge: 2012-04-09 | Disposition: A | Discharge status: Home / Self Care | Payer: Medicare Other | Source: Ambulatory Visit | Attending: Nephrology | Admitting: Nephrology

## 2012-04-09 DIAGNOSIS — D649 Anemia, unspecified: Secondary | ICD-10-CM | POA: Diagnosis not present

## 2012-04-09 DIAGNOSIS — N184 Chronic kidney disease, stage 4 (severe): Secondary | ICD-10-CM | POA: Diagnosis not present

## 2012-04-09 DIAGNOSIS — N2581 Secondary hyperparathyroidism of renal origin: Secondary | ICD-10-CM | POA: Diagnosis not present

## 2012-04-09 DIAGNOSIS — I129 Hypertensive chronic kidney disease with stage 1 through stage 4 chronic kidney disease, or unspecified chronic kidney disease: Secondary | ICD-10-CM | POA: Diagnosis not present

## 2012-04-09 LAB — FERRITIN: Ferritin: 194 ng/mL (ref 22–322)

## 2012-04-09 LAB — POCT HEMOGLOBIN-HEMACUE: Hemoglobin: 10.9 g/dL — ABNORMAL LOW (ref 13.0–17.0)

## 2012-04-09 LAB — IRON AND TIBC: TIBC: 200 ug/dL — ABNORMAL LOW (ref 215–435)

## 2012-04-09 MED ORDER — EPOETIN ALFA 20000 UNIT/ML IJ SOLN
INTRAMUSCULAR | Status: AC
  Filled 2012-04-09: qty 1

## 2012-04-09 MED ORDER — EPOETIN ALFA 20000 UNIT/ML IJ SOLN
20000.0000 [IU] | INTRAMUSCULAR | Status: DC
  Administered 2012-04-09: 20000 [IU] via SUBCUTANEOUS

## 2012-04-14 DIAGNOSIS — D649 Anemia, unspecified: Secondary | ICD-10-CM | POA: Diagnosis not present

## 2012-04-14 DIAGNOSIS — R0602 Shortness of breath: Secondary | ICD-10-CM | POA: Diagnosis not present

## 2012-04-14 DIAGNOSIS — I252 Old myocardial infarction: Secondary | ICD-10-CM | POA: Diagnosis not present

## 2012-04-14 DIAGNOSIS — I059 Rheumatic mitral valve disease, unspecified: Secondary | ICD-10-CM | POA: Diagnosis not present

## 2012-04-23 ENCOUNTER — Other Ambulatory Visit (HOSPITAL_COMMUNITY): Payer: Self-pay | Admitting: *Deleted

## 2012-04-23 ENCOUNTER — Inpatient Hospital Stay (HOSPITAL_COMMUNITY): Admission: RE | Admit: 2012-04-23 | Payer: Medicare Other | Source: Ambulatory Visit

## 2012-04-24 ENCOUNTER — Encounter (HOSPITAL_COMMUNITY)
Admission: RE | Admit: 2012-04-24 | Discharge: 2012-04-24 | Disposition: A | Discharge status: Home / Self Care | Payer: Medicare Other | Source: Ambulatory Visit | Attending: Nephrology | Admitting: Nephrology

## 2012-04-24 DIAGNOSIS — N183 Chronic kidney disease, stage 3 unspecified: Secondary | ICD-10-CM | POA: Insufficient documentation

## 2012-04-24 DIAGNOSIS — D638 Anemia in other chronic diseases classified elsewhere: Secondary | ICD-10-CM | POA: Insufficient documentation

## 2012-04-24 MED ORDER — EPOETIN ALFA 20000 UNIT/ML IJ SOLN: 20000.0000 [IU] | INTRAMUSCULAR | Status: DC

## 2012-05-08 ENCOUNTER — Encounter (HOSPITAL_COMMUNITY)
Admission: RE | Admit: 2012-05-08 | Discharge: 2012-05-08 | Disposition: A | Discharge status: Home / Self Care | Payer: Medicare Other | Source: Ambulatory Visit | Attending: Nephrology | Admitting: Nephrology

## 2012-05-08 LAB — POCT HEMOGLOBIN-HEMACUE: Hemoglobin: 11.9 g/dL — ABNORMAL LOW (ref 13.0–17.0)

## 2012-05-08 LAB — IRON AND TIBC
Iron: 127 ug/dL (ref 42–135)
TIBC: 236 ug/dL (ref 215–435)

## 2012-05-08 MED ORDER — EPOETIN ALFA 20000 UNIT/ML IJ SOLN: 20000.0000 [IU] | INTRAMUSCULAR | Status: DC

## 2012-05-20 DIAGNOSIS — E119 Type 2 diabetes mellitus without complications: Secondary | ICD-10-CM | POA: Diagnosis not present

## 2012-05-22 ENCOUNTER — Encounter (HOSPITAL_COMMUNITY)
Admission: RE | Admit: 2012-05-22 | Discharge: 2012-05-22 | Disposition: A | Discharge status: Home / Self Care | Payer: Medicare Other | Source: Ambulatory Visit | Attending: Nephrology | Admitting: Nephrology

## 2012-05-22 MED ORDER — EPOETIN ALFA 20000 UNIT/ML IJ SOLN: 20000.0000 [IU] | INTRAMUSCULAR | Status: DC

## 2012-06-04 ENCOUNTER — Encounter (HOSPITAL_COMMUNITY)
Admission: RE | Admit: 2012-06-04 | Discharge: 2012-06-04 | Disposition: A | Discharge status: Home / Self Care | Payer: Medicare Other | Source: Ambulatory Visit | Attending: Nephrology | Admitting: Nephrology

## 2012-06-04 DIAGNOSIS — N183 Chronic kidney disease, stage 3 unspecified: Secondary | ICD-10-CM | POA: Insufficient documentation

## 2012-06-04 DIAGNOSIS — D638 Anemia in other chronic diseases classified elsewhere: Secondary | ICD-10-CM | POA: Diagnosis not present

## 2012-06-04 LAB — IRON AND TIBC
Iron: 137 ug/dL — ABNORMAL HIGH (ref 42–135)
TIBC: 225 ug/dL (ref 215–435)
UIBC: 88 ug/dL — ABNORMAL LOW (ref 125–400)

## 2012-06-04 LAB — FERRITIN: Ferritin: 166 ng/mL (ref 22–322)

## 2012-06-04 MED ORDER — EPOETIN ALFA 20000 UNIT/ML IJ SOLN: 20000.0000 [IU] | INTRAMUSCULAR | Status: DC

## 2012-06-05 ENCOUNTER — Encounter (HOSPITAL_COMMUNITY): Payer: Medicare Other

## 2012-06-19 ENCOUNTER — Encounter (HOSPITAL_COMMUNITY)
Admission: RE | Admit: 2012-06-19 | Discharge: 2012-06-19 | Disposition: A | Discharge status: Home / Self Care | Payer: Medicare Other | Source: Ambulatory Visit | Attending: Nephrology | Admitting: Nephrology

## 2012-06-19 DIAGNOSIS — D638 Anemia in other chronic diseases classified elsewhere: Secondary | ICD-10-CM | POA: Diagnosis not present

## 2012-07-03 ENCOUNTER — Encounter (HOSPITAL_COMMUNITY)
Admission: RE | Admit: 2012-07-03 | Discharge: 2012-07-03 | Disposition: A | Discharge status: Home / Self Care | Payer: Medicare Other | Source: Ambulatory Visit | Attending: Nephrology | Admitting: Nephrology

## 2012-07-03 DIAGNOSIS — N183 Chronic kidney disease, stage 3 unspecified: Secondary | ICD-10-CM | POA: Diagnosis not present

## 2012-07-03 DIAGNOSIS — D638 Anemia in other chronic diseases classified elsewhere: Secondary | ICD-10-CM | POA: Insufficient documentation

## 2012-07-03 LAB — RENAL FUNCTION PANEL
BUN: 34 mg/dL — ABNORMAL HIGH (ref 6–23)
CO2: 25 mEq/L (ref 19–32)
Calcium: 8.9 mg/dL (ref 8.4–10.5)
Creatinine, Ser: 2.13 mg/dL — ABNORMAL HIGH (ref 0.50–1.35)
Glucose, Bld: 243 mg/dL — ABNORMAL HIGH (ref 70–99)
Phosphorus: 3 mg/dL (ref 2.3–4.6)

## 2012-07-03 LAB — URINALYSIS, MICROSCOPIC ONLY
Glucose, UA: NEGATIVE mg/dL
Leukocytes, UA: NEGATIVE
Protein, ur: NEGATIVE mg/dL
Specific Gravity, Urine: 1.01 (ref 1.005–1.030)
pH: 5.5 (ref 5.0–8.0)

## 2012-07-03 LAB — FERRITIN: Ferritin: 162 ng/mL (ref 22–322)

## 2012-07-03 LAB — IRON AND TIBC: TIBC: 244 ug/dL (ref 215–435)

## 2012-07-03 MED ORDER — EPOETIN ALFA 20000 UNIT/ML IJ SOLN: 20000.0000 [IU] | INTRAMUSCULAR | Status: DC

## 2012-07-04 ENCOUNTER — Inpatient Hospital Stay (HOSPITAL_COMMUNITY)
Admission: RE | Admit: 2012-07-04 | Discharge: 2012-07-04 | Payer: Medicare Other | Source: Ambulatory Visit | Attending: Nephrology | Admitting: Nephrology

## 2012-07-04 DIAGNOSIS — Z23 Encounter for immunization: Secondary | ICD-10-CM | POA: Diagnosis not present

## 2012-07-11 DIAGNOSIS — I129 Hypertensive chronic kidney disease with stage 1 through stage 4 chronic kidney disease, or unspecified chronic kidney disease: Secondary | ICD-10-CM | POA: Diagnosis not present

## 2012-07-11 DIAGNOSIS — N2581 Secondary hyperparathyroidism of renal origin: Secondary | ICD-10-CM | POA: Diagnosis not present

## 2012-07-11 DIAGNOSIS — D649 Anemia, unspecified: Secondary | ICD-10-CM | POA: Diagnosis not present

## 2012-07-11 DIAGNOSIS — N184 Chronic kidney disease, stage 4 (severe): Secondary | ICD-10-CM | POA: Diagnosis not present

## 2012-07-16 ENCOUNTER — Encounter (INDEPENDENT_AMBULATORY_CARE_PROVIDER_SITE_OTHER): Payer: Medicare Other | Admitting: Ophthalmology

## 2012-07-17 ENCOUNTER — Encounter (HOSPITAL_COMMUNITY)
Admission: RE | Admit: 2012-07-17 | Discharge: 2012-07-17 | Disposition: A | Discharge status: Home / Self Care | Payer: Medicare Other | Source: Ambulatory Visit | Attending: Nephrology | Admitting: Nephrology

## 2012-07-17 MED ORDER — EPOETIN ALFA 20000 UNIT/ML IJ SOLN: 20000.0000 [IU] | INTRAMUSCULAR | Status: DC

## 2012-07-23 ENCOUNTER — Encounter (INDEPENDENT_AMBULATORY_CARE_PROVIDER_SITE_OTHER): Payer: Medicare Other | Admitting: Ophthalmology

## 2012-07-23 DIAGNOSIS — H43819 Vitreous degeneration, unspecified eye: Secondary | ICD-10-CM

## 2012-07-23 DIAGNOSIS — I10 Essential (primary) hypertension: Secondary | ICD-10-CM

## 2012-07-23 DIAGNOSIS — H2510 Age-related nuclear cataract, unspecified eye: Secondary | ICD-10-CM

## 2012-07-23 DIAGNOSIS — E1139 Type 2 diabetes mellitus with other diabetic ophthalmic complication: Secondary | ICD-10-CM

## 2012-07-23 DIAGNOSIS — H3530 Unspecified macular degeneration: Secondary | ICD-10-CM

## 2012-07-23 DIAGNOSIS — E11319 Type 2 diabetes mellitus with unspecified diabetic retinopathy without macular edema: Secondary | ICD-10-CM

## 2012-07-23 DIAGNOSIS — H35039 Hypertensive retinopathy, unspecified eye: Secondary | ICD-10-CM

## 2012-07-30 ENCOUNTER — Other Ambulatory Visit (HOSPITAL_COMMUNITY): Payer: Self-pay | Admitting: *Deleted

## 2012-07-31 ENCOUNTER — Encounter (HOSPITAL_COMMUNITY)
Admission: RE | Admit: 2012-07-31 | Discharge: 2012-07-31 | Disposition: A | Discharge status: Home / Self Care | Payer: Medicare Other | Source: Ambulatory Visit | Attending: Nephrology | Admitting: Nephrology

## 2012-07-31 DIAGNOSIS — D638 Anemia in other chronic diseases classified elsewhere: Secondary | ICD-10-CM | POA: Diagnosis not present

## 2012-07-31 DIAGNOSIS — C61 Malignant neoplasm of prostate: Secondary | ICD-10-CM | POA: Diagnosis not present

## 2012-07-31 DIAGNOSIS — N183 Chronic kidney disease, stage 3 unspecified: Secondary | ICD-10-CM | POA: Insufficient documentation

## 2012-07-31 MED ORDER — EPOETIN ALFA 20000 UNIT/ML IJ SOLN: 20000.0000 [IU] | INTRAMUSCULAR | Status: DC

## 2012-07-31 MED ORDER — FENTANYL CITRATE 0.05 MG/ML IJ SOLN
INTRAMUSCULAR | Status: AC
  Filled 2012-07-31: qty 2

## 2012-07-31 MED ORDER — MIDAZOLAM HCL 5 MG/ML IJ SOLN
INTRAMUSCULAR | Status: AC
  Filled 2012-07-31: qty 2

## 2012-08-01 LAB — FERRITIN: Ferritin: 159 ng/mL (ref 22–322)

## 2012-08-01 LAB — POCT HEMOGLOBIN-HEMACUE: Hemoglobin: 11.7 g/dL — ABNORMAL LOW (ref 13.0–17.0)

## 2012-08-01 LAB — IRON AND TIBC: TIBC: 249 ug/dL (ref 215–435)

## 2012-08-06 DIAGNOSIS — C61 Malignant neoplasm of prostate: Secondary | ICD-10-CM | POA: Diagnosis not present

## 2012-08-06 DIAGNOSIS — R972 Elevated prostate specific antigen [PSA]: Secondary | ICD-10-CM | POA: Diagnosis not present

## 2012-08-14 ENCOUNTER — Encounter (HOSPITAL_COMMUNITY)
Admission: RE | Admit: 2012-08-14 | Discharge: 2012-08-14 | Disposition: A | Discharge status: Home / Self Care | Payer: Medicare Other | Source: Ambulatory Visit | Attending: Nephrology | Admitting: Nephrology

## 2012-08-14 LAB — POCT HEMOGLOBIN-HEMACUE: Hemoglobin: 11.7 g/dL — ABNORMAL LOW (ref 13.0–17.0)

## 2012-08-14 MED ORDER — EPOETIN ALFA 20000 UNIT/ML IJ SOLN: 20000.0000 [IU] | INTRAMUSCULAR | Status: DC

## 2012-08-28 ENCOUNTER — Encounter (HOSPITAL_COMMUNITY)
Admission: RE | Admit: 2012-08-28 | Discharge: 2012-08-28 | Disposition: A | Discharge status: Home / Self Care | Payer: Medicare Other | Source: Ambulatory Visit | Attending: Nephrology | Admitting: Nephrology

## 2012-08-28 DIAGNOSIS — N183 Chronic kidney disease, stage 3 unspecified: Secondary | ICD-10-CM | POA: Diagnosis not present

## 2012-08-28 DIAGNOSIS — D638 Anemia in other chronic diseases classified elsewhere: Secondary | ICD-10-CM | POA: Insufficient documentation

## 2012-08-28 LAB — POCT HEMOGLOBIN-HEMACUE: Hemoglobin: 12.3 g/dL — ABNORMAL LOW (ref 13.0–17.0)

## 2012-08-28 LAB — IRON AND TIBC
Saturation Ratios: 55 % (ref 20–55)
UIBC: 115 ug/dL — ABNORMAL LOW (ref 125–400)

## 2012-08-28 MED ORDER — EPOETIN ALFA 20000 UNIT/ML IJ SOLN: 20000.0000 [IU] | INTRAMUSCULAR | Status: DC

## 2012-09-02 DIAGNOSIS — E119 Type 2 diabetes mellitus without complications: Secondary | ICD-10-CM | POA: Diagnosis not present

## 2012-09-02 DIAGNOSIS — E782 Mixed hyperlipidemia: Secondary | ICD-10-CM | POA: Diagnosis not present

## 2012-09-10 ENCOUNTER — Other Ambulatory Visit (HOSPITAL_COMMUNITY): Payer: Self-pay | Admitting: *Deleted

## 2012-09-10 DIAGNOSIS — E7800 Pure hypercholesterolemia, unspecified: Secondary | ICD-10-CM | POA: Diagnosis not present

## 2012-09-10 DIAGNOSIS — E119 Type 2 diabetes mellitus without complications: Secondary | ICD-10-CM | POA: Diagnosis not present

## 2012-09-10 DIAGNOSIS — N189 Chronic kidney disease, unspecified: Secondary | ICD-10-CM | POA: Diagnosis not present

## 2012-09-11 ENCOUNTER — Inpatient Hospital Stay (HOSPITAL_COMMUNITY): Admission: RE | Admit: 2012-09-11 | Payer: Medicare Other | Source: Ambulatory Visit

## 2012-10-27 DIAGNOSIS — I252 Old myocardial infarction: Secondary | ICD-10-CM | POA: Diagnosis not present

## 2012-10-27 DIAGNOSIS — D649 Anemia, unspecified: Secondary | ICD-10-CM | POA: Diagnosis not present

## 2012-10-27 DIAGNOSIS — R0602 Shortness of breath: Secondary | ICD-10-CM | POA: Diagnosis not present

## 2012-10-27 DIAGNOSIS — I059 Rheumatic mitral valve disease, unspecified: Secondary | ICD-10-CM | POA: Diagnosis not present

## 2012-11-03 DIAGNOSIS — N184 Chronic kidney disease, stage 4 (severe): Secondary | ICD-10-CM | POA: Diagnosis not present

## 2012-11-03 DIAGNOSIS — N2581 Secondary hyperparathyroidism of renal origin: Secondary | ICD-10-CM | POA: Diagnosis not present

## 2012-11-03 DIAGNOSIS — D649 Anemia, unspecified: Secondary | ICD-10-CM | POA: Diagnosis not present

## 2012-11-14 DIAGNOSIS — D631 Anemia in chronic kidney disease: Secondary | ICD-10-CM | POA: Diagnosis not present

## 2012-11-14 DIAGNOSIS — I129 Hypertensive chronic kidney disease with stage 1 through stage 4 chronic kidney disease, or unspecified chronic kidney disease: Secondary | ICD-10-CM | POA: Diagnosis not present

## 2012-11-14 DIAGNOSIS — N039 Chronic nephritic syndrome with unspecified morphologic changes: Secondary | ICD-10-CM | POA: Diagnosis not present

## 2012-11-14 DIAGNOSIS — N2581 Secondary hyperparathyroidism of renal origin: Secondary | ICD-10-CM | POA: Diagnosis not present

## 2012-11-14 DIAGNOSIS — N184 Chronic kidney disease, stage 4 (severe): Secondary | ICD-10-CM | POA: Diagnosis not present

## 2012-12-16 DIAGNOSIS — E7800 Pure hypercholesterolemia, unspecified: Secondary | ICD-10-CM | POA: Diagnosis not present

## 2012-12-16 DIAGNOSIS — E119 Type 2 diabetes mellitus without complications: Secondary | ICD-10-CM | POA: Diagnosis not present

## 2012-12-16 DIAGNOSIS — N189 Chronic kidney disease, unspecified: Secondary | ICD-10-CM | POA: Diagnosis not present

## 2012-12-17 DIAGNOSIS — E875 Hyperkalemia: Secondary | ICD-10-CM | POA: Diagnosis not present

## 2012-12-17 DIAGNOSIS — N189 Chronic kidney disease, unspecified: Secondary | ICD-10-CM | POA: Diagnosis not present

## 2013-01-21 DIAGNOSIS — I2510 Atherosclerotic heart disease of native coronary artery without angina pectoris: Secondary | ICD-10-CM | POA: Diagnosis not present

## 2013-01-21 DIAGNOSIS — G479 Sleep disorder, unspecified: Secondary | ICD-10-CM | POA: Diagnosis not present

## 2013-01-21 DIAGNOSIS — E7800 Pure hypercholesterolemia, unspecified: Secondary | ICD-10-CM | POA: Diagnosis not present

## 2013-01-21 DIAGNOSIS — Z23 Encounter for immunization: Secondary | ICD-10-CM | POA: Diagnosis not present

## 2013-01-21 DIAGNOSIS — E1129 Type 2 diabetes mellitus with other diabetic kidney complication: Secondary | ICD-10-CM | POA: Diagnosis not present

## 2013-01-21 DIAGNOSIS — I10 Essential (primary) hypertension: Secondary | ICD-10-CM | POA: Diagnosis not present

## 2013-01-29 IMAGING — CR DG CHEST 1V PORT
1 series · 1 of 1 positions shown · non-contrast
Comparison: None.

CLINICAL DATA: Code STEMI

PORTABLE CHEST - 1 VIEW

[view not recorded]
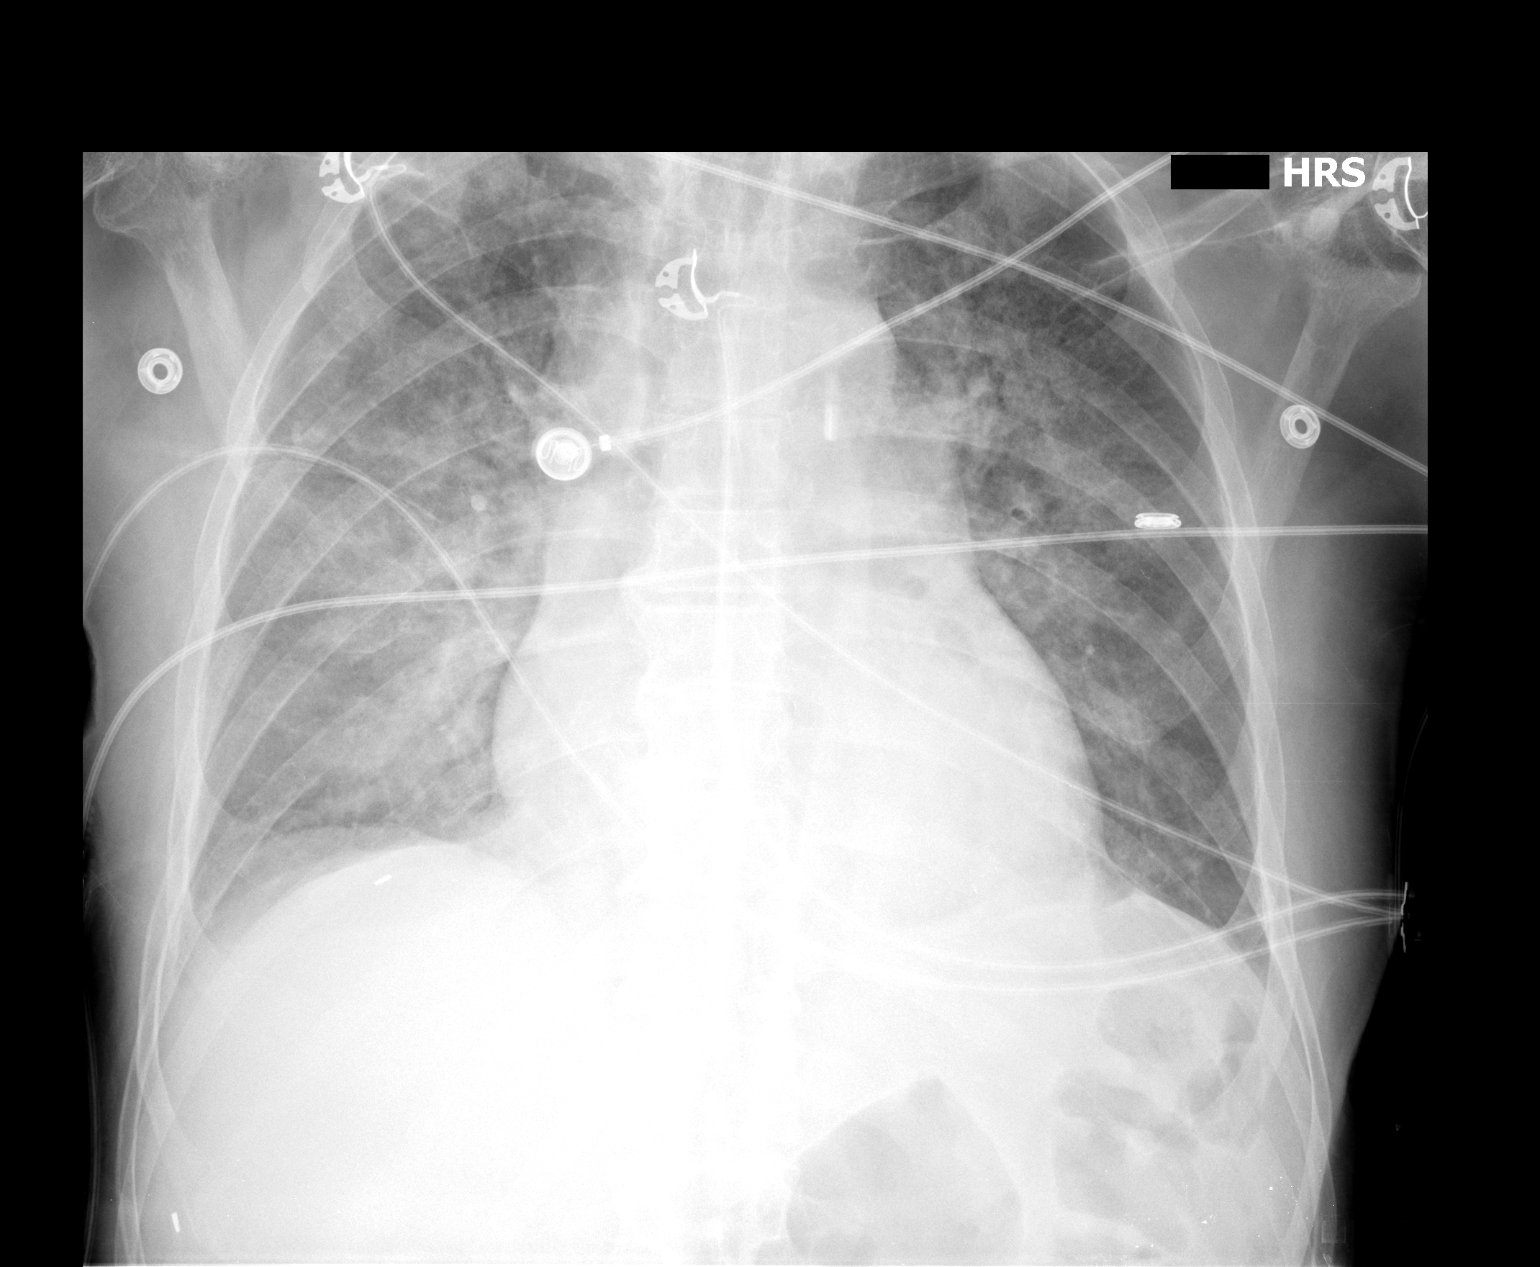

[1 of 1 positions shown; findings below may reference images not displayed]

FINDINGS: There is airspace disease primarily perihilar most
consistent with edema, and there may be small effusions present.
Cardiomegaly is present.  Aortic balloon catheter is noted with the
tip near the aortic knob.
IMPRESSION: Diffuse airspace disease most consistent with edema.  Cardiomegaly.

## 2013-02-23 DIAGNOSIS — N184 Chronic kidney disease, stage 4 (severe): Secondary | ICD-10-CM | POA: Diagnosis not present

## 2013-02-23 DIAGNOSIS — D649 Anemia, unspecified: Secondary | ICD-10-CM | POA: Diagnosis not present

## 2013-03-03 DIAGNOSIS — N2581 Secondary hyperparathyroidism of renal origin: Secondary | ICD-10-CM | POA: Diagnosis not present

## 2013-03-03 DIAGNOSIS — D649 Anemia, unspecified: Secondary | ICD-10-CM | POA: Diagnosis not present

## 2013-03-03 DIAGNOSIS — I129 Hypertensive chronic kidney disease with stage 1 through stage 4 chronic kidney disease, or unspecified chronic kidney disease: Secondary | ICD-10-CM | POA: Diagnosis not present

## 2013-03-03 DIAGNOSIS — N184 Chronic kidney disease, stage 4 (severe): Secondary | ICD-10-CM | POA: Diagnosis not present

## 2013-03-12 DIAGNOSIS — N189 Chronic kidney disease, unspecified: Secondary | ICD-10-CM | POA: Diagnosis not present

## 2013-03-12 DIAGNOSIS — E875 Hyperkalemia: Secondary | ICD-10-CM | POA: Diagnosis not present

## 2013-03-18 DIAGNOSIS — N189 Chronic kidney disease, unspecified: Secondary | ICD-10-CM | POA: Diagnosis not present

## 2013-03-18 DIAGNOSIS — R609 Edema, unspecified: Secondary | ICD-10-CM | POA: Diagnosis not present

## 2013-03-18 DIAGNOSIS — E7800 Pure hypercholesterolemia, unspecified: Secondary | ICD-10-CM | POA: Diagnosis not present

## 2013-05-11 ENCOUNTER — Other Ambulatory Visit: Payer: Self-pay | Admitting: *Deleted

## 2013-05-11 DIAGNOSIS — E1129 Type 2 diabetes mellitus with other diabetic kidney complication: Secondary | ICD-10-CM

## 2013-05-11 DIAGNOSIS — E782 Mixed hyperlipidemia: Secondary | ICD-10-CM

## 2013-05-28 ENCOUNTER — Other Ambulatory Visit (INDEPENDENT_AMBULATORY_CARE_PROVIDER_SITE_OTHER): Payer: Medicare Other

## 2013-05-28 DIAGNOSIS — E782 Mixed hyperlipidemia: Secondary | ICD-10-CM | POA: Diagnosis not present

## 2013-05-28 DIAGNOSIS — E1129 Type 2 diabetes mellitus with other diabetic kidney complication: Secondary | ICD-10-CM

## 2013-05-28 LAB — LIPID PANEL
Cholesterol: 130 mg/dL (ref 0–200)
HDL: 51 mg/dL
LDL Cholesterol: 67 mg/dL (ref 0–99)
Total CHOL/HDL Ratio: 3
Triglycerides: 58 mg/dL (ref 0.0–149.0)
VLDL: 11.6 mg/dL (ref 0.0–40.0)

## 2013-05-28 LAB — COMPREHENSIVE METABOLIC PANEL WITH GFR
ALT: 13 U/L (ref 0–53)
AST: 20 U/L (ref 0–37)
Albumin: 3.8 g/dL (ref 3.5–5.2)
Alkaline Phosphatase: 49 U/L (ref 39–117)
BUN: 34 mg/dL — ABNORMAL HIGH (ref 6–23)
CO2: 28 meq/L (ref 19–32)
Calcium: 9.3 mg/dL (ref 8.4–10.5)
Chloride: 108 meq/L (ref 96–112)
Creatinine, Ser: 2.1 mg/dL — ABNORMAL HIGH (ref 0.4–1.5)
GFR: 31.71 mL/min — ABNORMAL LOW
Glucose, Bld: 129 mg/dL — ABNORMAL HIGH (ref 70–99)
Potassium: 4.2 meq/L (ref 3.5–5.1)
Sodium: 141 meq/L (ref 135–145)
Total Bilirubin: 1 mg/dL (ref 0.3–1.2)
Total Protein: 6.3 g/dL (ref 6.0–8.3)

## 2013-05-28 LAB — URINALYSIS, ROUTINE W REFLEX MICROSCOPIC
Bilirubin Urine: NEGATIVE
Hgb urine dipstick: NEGATIVE
Ketones, ur: NEGATIVE
Leukocytes, UA: NEGATIVE
Nitrite: NEGATIVE
Specific Gravity, Urine: >=1.03
Total Protein, Urine: 30
Urine Glucose: NEGATIVE
Urobilinogen, UA: 0.2
pH: 6 (ref 5.0–8.0)

## 2013-05-28 LAB — MICROALBUMIN / CREATININE URINE RATIO: Microalb, Ur: 13.5 mg/dL — ABNORMAL HIGH (ref 0.0–1.9)

## 2013-05-28 LAB — HEMOGLOBIN A1C: Hgb A1c MFr Bld: 7.4 % — ABNORMAL HIGH (ref 4.6–6.5)

## 2013-05-28 NOTE — Progress Notes (Signed)
Labs only

## 2013-05-29 ENCOUNTER — Telehealth: Payer: Self-pay | Admitting: Endocrinology

## 2013-05-29 ENCOUNTER — Other Ambulatory Visit: Payer: Medicare Other

## 2013-06-03 ENCOUNTER — Encounter: Payer: Self-pay | Admitting: Endocrinology

## 2013-06-03 ENCOUNTER — Ambulatory Visit (INDEPENDENT_AMBULATORY_CARE_PROVIDER_SITE_OTHER): Payer: Medicare Other | Admitting: Endocrinology

## 2013-06-03 VITALS — BP 112/62 | HR 82 | Temp 98.4°F | Resp 10 | Wt 187.0 lb

## 2013-06-03 NOTE — Patient Instructions (Signed)
Please check blood sugars at least half the time about 2 hours after any meal and as directed on waking up. Please bring blood sugar monitor to each visit  If sugar always > 200 after supper go up to to 8 Humalog  at supper
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
## 2013-06-22 DIAGNOSIS — N184 Chronic kidney disease, stage 4 (severe): Secondary | ICD-10-CM | POA: Diagnosis not present

## 2013-06-22 DIAGNOSIS — D649 Anemia, unspecified: Secondary | ICD-10-CM | POA: Diagnosis not present

## 2013-06-22 DIAGNOSIS — N2581 Secondary hyperparathyroidism of renal origin: Secondary | ICD-10-CM | POA: Diagnosis not present

## 2013-06-30 DIAGNOSIS — I129 Hypertensive chronic kidney disease with stage 1 through stage 4 chronic kidney disease, or unspecified chronic kidney disease: Secondary | ICD-10-CM | POA: Diagnosis not present

## 2013-06-30 DIAGNOSIS — N184 Chronic kidney disease, stage 4 (severe): Secondary | ICD-10-CM | POA: Diagnosis not present

## 2013-06-30 DIAGNOSIS — D649 Anemia, unspecified: Secondary | ICD-10-CM | POA: Diagnosis not present

## 2013-06-30 DIAGNOSIS — Z23 Encounter for immunization: Secondary | ICD-10-CM | POA: Diagnosis not present

## 2013-06-30 DIAGNOSIS — N2581 Secondary hyperparathyroidism of renal origin: Secondary | ICD-10-CM | POA: Diagnosis not present

## 2013-07-14 DIAGNOSIS — Z1211 Encounter for screening for malignant neoplasm of colon: Secondary | ICD-10-CM | POA: Diagnosis not present

## 2013-07-14 DIAGNOSIS — I2510 Atherosclerotic heart disease of native coronary artery without angina pectoris: Secondary | ICD-10-CM | POA: Diagnosis not present

## 2013-07-14 DIAGNOSIS — K625 Hemorrhage of anus and rectum: Secondary | ICD-10-CM | POA: Diagnosis not present

## 2013-07-22 ENCOUNTER — Ambulatory Visit (INDEPENDENT_AMBULATORY_CARE_PROVIDER_SITE_OTHER): Payer: Medicare Other | Admitting: Ophthalmology

## 2013-07-22 DIAGNOSIS — I10 Essential (primary) hypertension: Secondary | ICD-10-CM | POA: Diagnosis not present

## 2013-07-22 DIAGNOSIS — H35039 Hypertensive retinopathy, unspecified eye: Secondary | ICD-10-CM

## 2013-07-22 DIAGNOSIS — E11319 Type 2 diabetes mellitus with unspecified diabetic retinopathy without macular edema: Secondary | ICD-10-CM

## 2013-07-22 DIAGNOSIS — H3530 Unspecified macular degeneration: Secondary | ICD-10-CM

## 2013-07-22 DIAGNOSIS — H2510 Age-related nuclear cataract, unspecified eye: Secondary | ICD-10-CM

## 2013-07-22 DIAGNOSIS — H43819 Vitreous degeneration, unspecified eye: Secondary | ICD-10-CM

## 2013-07-22 DIAGNOSIS — E1139 Type 2 diabetes mellitus with other diabetic ophthalmic complication: Secondary | ICD-10-CM | POA: Diagnosis not present

## 2013-07-23 ENCOUNTER — Ambulatory Visit (INDEPENDENT_AMBULATORY_CARE_PROVIDER_SITE_OTHER): Payer: Medicare Other | Admitting: Ophthalmology

## 2013-07-26 ENCOUNTER — Encounter: Payer: Self-pay | Admitting: Interventional Cardiology

## 2013-07-28 ENCOUNTER — Ambulatory Visit (INDEPENDENT_AMBULATORY_CARE_PROVIDER_SITE_OTHER): Payer: Medicare Other | Admitting: Interventional Cardiology

## 2013-07-28 ENCOUNTER — Encounter: Payer: Self-pay | Admitting: Interventional Cardiology

## 2013-07-28 VITALS — BP 118/58 | HR 68 | Ht 72.0 in | Wt 190.0 lb

## 2013-07-28 DIAGNOSIS — I2510 Atherosclerotic heart disease of native coronary artery without angina pectoris: Secondary | ICD-10-CM

## 2013-07-28 DIAGNOSIS — I2119 ST elevation (STEMI) myocardial infarction involving other coronary artery of inferior wall: Secondary | ICD-10-CM | POA: Diagnosis not present

## 2013-07-28 DIAGNOSIS — I059 Rheumatic mitral valve disease, unspecified: Secondary | ICD-10-CM | POA: Diagnosis not present

## 2013-07-28 NOTE — Progress Notes (Signed)
Patient ID: Robert [name], male   DOB: 1928-03-23, 79 y.o.   MRN: [id number]    [address] [place], [place]  [address] Phone: [phone] Fax:  [phone]  Date:  07/28/2013   ID:  Robert [name], DOB 12-Dec-1927, MRN [id number]  PCP:  Gennette Pac, MD      History of Present Illness: Robert [name] is a 79 y.o. male with CAD, mitral regurgitation and renal insufficiency. His wife has recurrent breast cancer. He has been walking regurlarly. He has not had severe SHOB or chest discomfort. SOme days, he has some mild SHOB. He was going to the nephrologist and needed iron infusion in the past for his Hbg. He had severe anemia but no GI source was found. Energy level better since Hbg better. He stays active. He walks in stores when the weather is bad.    Wt Readings from Last 3 Encounters:  07/28/13 190 lb (86.183 kg)  06/03/13 187 lb (84.823 kg)  02/27/12 170 lb (77.111 kg)     Past Medical History  Diagnosis Date  . CAD (coronary artery disease), native coronary artery   . Severe mitral regurgitation   . Type II or unspecified type diabetes mellitus with renal manifestations, not stated as uncontrolled(250.40)   . PVD (peripheral vascular disease) 39% bilateral carotids  . Angina   . Blood transfusion   . Anemia   . Acute MI inferior posterior subsequent episode care     "have had 2 MI; last one was 12/2010"  . High cholesterol   . Heart murmur   . Prostate cancer     S/P radiation; "40 treatments"  . Diabetes mellitus   . GI bleeding     "they say I'm bleeding from somewhere"  . Anxiety     Current Outpatient Prescriptions  Medication Sig Dispense Refill  . ALPRAZolam (XANAX) 0.25 MG tablet Take 0.25 mg by mouth at bedtime as needed.       . beta carotene w/minerals (OCUVITE) tablet Take 1 tablet by mouth daily.      . clopidogrel (PLAVIX) 75 MG tablet Take 75 mg by mouth daily.       . furosemide (LASIX) 20 MG tablet Take 20 mg by mouth 2 (two) times  daily.      Marland Kitchen glipiZIDE (GLUCOTROL) 5 MG tablet Take 5 mg by mouth 2 (two) times daily before a meal.      . insulin aspart (NOVOLOG) 100 UNIT/ML injection Inject 0-10 Units into the skin 3 (three) times daily before meals. Uses as sliding scale insulin      . isosorbide mononitrate (IMDUR) 30 MG 24 hr tablet Take 30 mg by mouth daily.       . metoprolol succinate (TOPROL-XL) 25 MG 24 hr tablet Take 12.5 mg by mouth daily.      . metoprolol tartrate (LOPRESSOR) 25 MG tablet       . Multiple Vitamin (MULITIVITAMIN WITH MINERALS) TABS Take 1 tablet by mouth daily.      . pantoprazole (PROTONIX) 40 MG tablet Take 40 mg by mouth daily.      . pravastatin (PRAVACHOL) 80 MG tablet Take 80 mg by mouth daily.        No current facility-administered medications for this visit.    Allergies:   No Known Allergies  Social History:  The patient  reports that he has quit smoking. His smoking use included Cigars. He has never used smokeless tobacco. He reports that he  does not drink alcohol or use illicit drugs.   Family History:  The patient's family history is negative for Malignant hyperthermia.   ROS:  Please see the history of present illness.  No nausea, vomiting.  No fevers, chills.  No focal weakness.  No dysuria.    All other systems reviewed and negative.   PHYSICAL EXAM: VS:  BP 118/58  Pulse 68  Ht 6' (1.829 m)  Wt 190 lb (86.183 kg)  BMI 25.76 kg/m2 Well nourished, well developed, in no acute distress HEENT: normal Neck: no JVD, no carotid bruits Cardiac:  normal S1, S2; RRR; 1/6 systolic murmur Lungs:  clear to auscultation bilaterally, no wheezing, rhonchi or rales Abd: soft, nontender, no hepatomegaly Ext: no edema Skin: warm and dry Neuro:   no focal abnormalities noted  EKG:  NSR, old posterior MI, no ST segment changes    ASSESSMENT AND PLAN:  Mitral Regurgitation  Continue Furosemide Tablet, 40 MG, half tablet, Orally, twice a day Not in heart failure. Not a good  candidate for MV repair. Continue diuretics. Severe in the past on echo in 5/12 in the hospital. No sx of CHF at this time. 2. MI, old  No overt CHF. prior inferior MI contributed to LV dysfunction and mitral regurgitation due to papillary muscle dysfunction. LDL 89 in 12/13. LVEF 50-55% in 5/12.  3. Anemia  He was anemic and had a normal EGD. Resolved. Improved energy.  Last Hbg 12.5.  He had an episode of rectal bleeding a few weeks ago but this resolved.   4. Shortness of breath  Improved. No signs of fluid overload. No change in weight.    Signed, Mina Marble, MD, [facility] 07/28/2013 10:12 AM

## 2013-07-28 NOTE — Patient Instructions (Signed)
Your physician recommends that you return for lab work on 09/25/13 FASTING for lipid and hepatic panel.  Your physician wants you to follow-up in: 9 months with Dr. [name]. You will receive a reminder letter in the mail two months in advance. If you don't receive a letter, please call our office to schedule the follow-up appointment.  Your physician recommends that you continue on your current medications as directed. Please refer to the Current Medication list given to you today.

## 2013-07-29 ENCOUNTER — Telehealth: Payer: Self-pay | Admitting: Endocrinology

## 2013-07-29 NOTE — Telephone Encounter (Signed)
Samples left, patient is aware

## 2013-08-04 DIAGNOSIS — R972 Elevated prostate specific antigen [PSA]: Secondary | ICD-10-CM | POA: Diagnosis not present

## 2013-08-04 DIAGNOSIS — C61 Malignant neoplasm of prostate: Secondary | ICD-10-CM | POA: Diagnosis not present

## 2013-08-10 ENCOUNTER — Other Ambulatory Visit: Payer: Self-pay | Admitting: *Deleted

## 2013-08-10 MED ORDER — INSULIN PEN NEEDLE 32G X 4 MM MISC: 1.0000 | Freq: Four times a day (QID) | Status: DC

## 2013-08-11 DIAGNOSIS — N509 Disorder of male genital organs, unspecified: Secondary | ICD-10-CM | POA: Diagnosis not present

## 2013-08-11 DIAGNOSIS — C61 Malignant neoplasm of prostate: Secondary | ICD-10-CM | POA: Diagnosis not present

## 2013-08-11 DIAGNOSIS — R972 Elevated prostate specific antigen [PSA]: Secondary | ICD-10-CM | POA: Diagnosis not present

## 2013-08-31 ENCOUNTER — Other Ambulatory Visit: Payer: Medicare Other

## 2013-09-02 ENCOUNTER — Other Ambulatory Visit: Payer: Self-pay | Admitting: *Deleted

## 2013-09-02 MED ORDER — INSULIN LISPRO 100 UNIT/ML (KWIKPEN): PEN_INJECTOR | SUBCUTANEOUS | Status: DC

## 2013-09-03 ENCOUNTER — Ambulatory Visit: Payer: Medicare Other | Admitting: Endocrinology

## 2013-09-09 ENCOUNTER — Other Ambulatory Visit (INDEPENDENT_AMBULATORY_CARE_PROVIDER_SITE_OTHER): Payer: Medicare Other

## 2013-09-09 DIAGNOSIS — I2510 Atherosclerotic heart disease of native coronary artery without angina pectoris: Secondary | ICD-10-CM

## 2013-09-09 LAB — HEPATIC FUNCTION PANEL
ALT: 11 U/L (ref 0–53)
AST: 19 U/L (ref 0–37)
Albumin: 4.1 g/dL (ref 3.5–5.2)
Alkaline Phosphatase: 48 U/L (ref 39–117)
Bilirubin, Direct: 0 mg/dL (ref 0.0–0.3)
Total Bilirubin: 0.9 mg/dL (ref 0.3–1.2)
Total Protein: 6.6 g/dL (ref 6.0–8.3)

## 2013-09-09 LAB — LIPID PANEL
Cholesterol: 141 mg/dL (ref 0–200)
VLDL: 16.6 mg/dL (ref 0.0–40.0)

## 2013-09-09 LAB — BASIC METABOLIC PANEL
Chloride: 108 mEq/L (ref 96–112)
Creatinine, Ser: 2.6 mg/dL — ABNORMAL HIGH (ref 0.4–1.5)
Potassium: 4.4 mEq/L (ref 3.5–5.1)

## 2013-09-09 LAB — HEMOGLOBIN A1C: Hgb A1c MFr Bld: 7 % — ABNORMAL HIGH (ref 4.6–6.5)

## 2013-09-15 ENCOUNTER — Telehealth: Payer: Self-pay | Admitting: Cardiology

## 2013-09-15 ENCOUNTER — Encounter: Payer: Self-pay | Admitting: Endocrinology

## 2013-09-15 ENCOUNTER — Ambulatory Visit (INDEPENDENT_AMBULATORY_CARE_PROVIDER_SITE_OTHER): Payer: Medicare Other | Admitting: Endocrinology

## 2013-09-15 VITALS — BP 124/64 | HR 85 | Temp 98.3°F | Resp 12 | Ht 70.0 in | Wt 190.9 lb

## 2013-09-15 DIAGNOSIS — E1129 Type 2 diabetes mellitus with other diabetic kidney complication: Secondary | ICD-10-CM

## 2013-09-15 DIAGNOSIS — E782 Mixed hyperlipidemia: Secondary | ICD-10-CM

## 2013-09-15 NOTE — Telephone Encounter (Signed)
1 yr f/u labs ordered and sheduled.

## 2013-09-15 NOTE — Patient Instructions (Signed)
May leave off Glipizide  Please check blood sugars at least half the time about 2 hours after any meal and as directed on waking up. Please bring blood sugar monitor to each visit
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
## 2013-09-15 NOTE — Telephone Encounter (Signed)
Message copied by Alcario Drought on Tue Sep 15, 2013 12:49 PM ------      Message from: SMART, Maralyn Sago      Created: Mon Sep 14, 2013  1:44 PM       LDL goal < 70 given CAD / PVD.      Meds: Pravastatin 80 mg qd.      Great panel.      Plan:      1.  No change in med.      2.  Recheck lipid panel and hepatic panel in 1 year.      Please notify patient, and set up labs. Thanks. ------

## 2013-09-25 ENCOUNTER — Other Ambulatory Visit: Payer: Medicare Other

## 2013-10-20 DIAGNOSIS — N184 Chronic kidney disease, stage 4 (severe): Secondary | ICD-10-CM | POA: Diagnosis not present

## 2013-10-20 DIAGNOSIS — D631 Anemia in chronic kidney disease: Secondary | ICD-10-CM | POA: Diagnosis not present

## 2013-10-27 DIAGNOSIS — N2581 Secondary hyperparathyroidism of renal origin: Secondary | ICD-10-CM | POA: Diagnosis not present

## 2013-10-27 DIAGNOSIS — N184 Chronic kidney disease, stage 4 (severe): Secondary | ICD-10-CM | POA: Diagnosis not present

## 2013-10-27 DIAGNOSIS — I129 Hypertensive chronic kidney disease with stage 1 through stage 4 chronic kidney disease, or unspecified chronic kidney disease: Secondary | ICD-10-CM | POA: Diagnosis not present

## 2013-10-27 DIAGNOSIS — D631 Anemia in chronic kidney disease: Secondary | ICD-10-CM | POA: Diagnosis not present

## 2013-11-16 ENCOUNTER — Other Ambulatory Visit: Payer: Self-pay | Admitting: Interventional Cardiology

## 2013-11-23 ENCOUNTER — Other Ambulatory Visit: Payer: Self-pay | Admitting: Interventional Cardiology

## 2013-11-24 ENCOUNTER — Other Ambulatory Visit: Payer: Self-pay | Admitting: *Deleted

## 2013-11-24 MED ORDER — INSULIN LISPRO 100 UNIT/ML (KWIKPEN): PEN_INJECTOR | SUBCUTANEOUS | Status: DC

## 2013-11-24 MED ORDER — INSULIN GLARGINE 100 UNIT/ML SOLOSTAR PEN: 20.0000 [IU] | PEN_INJECTOR | Freq: Every day | SUBCUTANEOUS | Status: DC

## 2013-11-25 NOTE — Telephone Encounter (Signed)
I am not sure which way the patient should be taking these, the way they were requested or the way the chart has them. Please advise. Thanks, MI

## 2014-01-14 ENCOUNTER — Encounter: Payer: Self-pay | Admitting: Endocrinology

## 2014-01-14 ENCOUNTER — Ambulatory Visit (INDEPENDENT_AMBULATORY_CARE_PROVIDER_SITE_OTHER): Payer: Medicare Other | Admitting: Endocrinology

## 2014-01-14 VITALS — BP 110/56 | HR 78 | Temp 98.3°F | Resp 14 | Ht 70.0 in | Wt 185.4 lb

## 2014-01-14 DIAGNOSIS — E1129 Type 2 diabetes mellitus with other diabetic kidney complication: Secondary | ICD-10-CM

## 2014-01-14 DIAGNOSIS — E782 Mixed hyperlipidemia: Secondary | ICD-10-CM | POA: Diagnosis not present

## 2014-01-14 LAB — COMPREHENSIVE METABOLIC PANEL
ALBUMIN: 3.9 g/dL (ref 3.5–5.2)
ALK PHOS: 45 U/L (ref 39–117)
ALT: 10 U/L (ref 0–53)
AST: 18 U/L (ref 0–37)
BUN: 32 mg/dL — ABNORMAL HIGH (ref 6–23)
CO2: 27 mEq/L (ref 19–32)
Calcium: 8.8 mg/dL (ref 8.4–10.5)
Chloride: 106 mEq/L (ref 96–112)
Creatinine, Ser: 2.4 mg/dL — ABNORMAL HIGH (ref 0.4–1.5)
GFR: 27.59 mL/min — ABNORMAL LOW (ref >60.00)
Glucose, Bld: 260 mg/dL — ABNORMAL HIGH (ref 70–99)
Potassium: 4.5 mEq/L (ref 3.5–5.1)
Sodium: 139 mEq/L (ref 135–145)
Total Bilirubin: 0.8 mg/dL (ref 0.3–1.2)
Total Protein: 6.5 g/dL (ref 6.0–8.3)

## 2014-01-14 LAB — URINALYSIS, ROUTINE W REFLEX MICROSCOPIC
Bilirubin Urine: NEGATIVE
HGB URINE DIPSTICK: NEGATIVE
KETONES UR: NEGATIVE
Leukocytes, UA: NEGATIVE
Nitrite: NEGATIVE
Specific Gravity, Urine: 1.025 (ref 1.000–1.030)
TOTAL PROTEIN, URINE-UPE24: NEGATIVE
URINE GLUCOSE: NEGATIVE
Urobilinogen, UA: 0.2 (ref 0.0–1.0)
pH: 6 (ref 5.0–8.0)

## 2014-01-14 LAB — HEMOGLOBIN A1C: HEMOGLOBIN A1C: 6.8 % — AB (ref 4.6–6.5)

## 2014-01-14 LAB — MICROALBUMIN / CREATININE URINE RATIO
CREATININE, U: 180.3 mg/dL
MICROALB/CREAT RATIO: 2.6 mg/g (ref 0.0–30.0)
Microalb, Ur: 4.7 mg/dL — ABNORMAL HIGH (ref 0.0–1.9)

## 2014-01-14 NOTE — Patient Instructions (Addendum)
More sugars at lunch time or at 8 pm  Lantus same
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
## 2014-01-20 DIAGNOSIS — N2581 Secondary hyperparathyroidism of renal origin: Secondary | ICD-10-CM | POA: Diagnosis not present

## 2014-01-20 DIAGNOSIS — D631 Anemia in chronic kidney disease: Secondary | ICD-10-CM | POA: Diagnosis not present

## 2014-01-20 DIAGNOSIS — N184 Chronic kidney disease, stage 4 (severe): Secondary | ICD-10-CM | POA: Diagnosis not present

## 2014-01-25 DIAGNOSIS — N184 Chronic kidney disease, stage 4 (severe): Secondary | ICD-10-CM | POA: Diagnosis not present

## 2014-01-25 DIAGNOSIS — I129 Hypertensive chronic kidney disease with stage 1 through stage 4 chronic kidney disease, or unspecified chronic kidney disease: Secondary | ICD-10-CM | POA: Diagnosis not present

## 2014-01-25 DIAGNOSIS — N2581 Secondary hyperparathyroidism of renal origin: Secondary | ICD-10-CM | POA: Diagnosis not present

## 2014-01-25 DIAGNOSIS — N039 Chronic nephritic syndrome with unspecified morphologic changes: Secondary | ICD-10-CM | POA: Diagnosis not present

## 2014-01-25 DIAGNOSIS — D631 Anemia in chronic kidney disease: Secondary | ICD-10-CM | POA: Diagnosis not present

## 2014-02-12 ENCOUNTER — Other Ambulatory Visit: Payer: Self-pay | Admitting: Interventional Cardiology

## 2014-04-19 ENCOUNTER — Other Ambulatory Visit (INDEPENDENT_AMBULATORY_CARE_PROVIDER_SITE_OTHER): Payer: Medicare Other

## 2014-04-19 DIAGNOSIS — D631 Anemia in chronic kidney disease: Secondary | ICD-10-CM | POA: Diagnosis not present

## 2014-04-19 DIAGNOSIS — N2581 Secondary hyperparathyroidism of renal origin: Secondary | ICD-10-CM | POA: Diagnosis not present

## 2014-04-19 DIAGNOSIS — E1129 Type 2 diabetes mellitus with other diabetic kidney complication: Secondary | ICD-10-CM

## 2014-04-19 DIAGNOSIS — N184 Chronic kidney disease, stage 4 (severe): Secondary | ICD-10-CM | POA: Diagnosis not present

## 2014-04-19 LAB — LIPID PANEL
CHOLESTEROL: 136 mg/dL (ref 0–200)
HDL: 56 mg/dL (ref >39.00)
LDL CALC: 65 mg/dL (ref 0–99)
NonHDL: 80
TRIGLYCERIDES: 74 mg/dL (ref 0.0–149.0)
Total CHOL/HDL Ratio: 2
VLDL: 14.8 mg/dL (ref 0.0–40.0)

## 2014-04-19 LAB — HEMOGLOBIN A1C: Hgb A1c MFr Bld: 7 % — ABNORMAL HIGH (ref 4.6–6.5)

## 2014-04-19 LAB — GLUCOSE, RANDOM: Glucose, Bld: 179 mg/dL — ABNORMAL HIGH (ref 70–99)

## 2014-04-22 ENCOUNTER — Ambulatory Visit (INDEPENDENT_AMBULATORY_CARE_PROVIDER_SITE_OTHER): Payer: Medicare Other | Admitting: Endocrinology

## 2014-04-22 ENCOUNTER — Other Ambulatory Visit: Payer: Self-pay | Admitting: *Deleted

## 2014-04-22 ENCOUNTER — Encounter: Payer: Self-pay | Admitting: Endocrinology

## 2014-04-22 VITALS — BP 134/72 | HR 61 | Temp 98.1°F | Resp 14 | Ht 70.0 in | Wt 186.2 lb

## 2014-04-22 DIAGNOSIS — IMO0001 Reserved for inherently not codable concepts without codable children: Secondary | ICD-10-CM

## 2014-04-22 DIAGNOSIS — E1165 Type 2 diabetes mellitus with hyperglycemia: Principal | ICD-10-CM

## 2014-04-22 MED ORDER — INSULIN LISPRO 100 UNIT/ML (KWIKPEN): PEN_INJECTOR | SUBCUTANEOUS | Status: DC
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
## 2014-04-22 NOTE — Patient Instructions (Addendum)
Please check blood sugars at least half the time about 2 hours after any meal and times per week on waking up. Please bring blood sugar monitor to each visit  Extra 2-4 units on Novolog for larger meals or fried/high fat food  Stop Glipizide

## 2014-04-27 DIAGNOSIS — I129 Hypertensive chronic kidney disease with stage 1 through stage 4 chronic kidney disease, or unspecified chronic kidney disease: Secondary | ICD-10-CM | POA: Diagnosis not present

## 2014-04-27 DIAGNOSIS — N184 Chronic kidney disease, stage 4 (severe): Secondary | ICD-10-CM | POA: Diagnosis not present

## 2014-04-27 DIAGNOSIS — N2581 Secondary hyperparathyroidism of renal origin: Secondary | ICD-10-CM | POA: Diagnosis not present

## 2014-04-27 DIAGNOSIS — D631 Anemia in chronic kidney disease: Secondary | ICD-10-CM | POA: Diagnosis not present

## 2014-05-05 DIAGNOSIS — I2589 Other forms of chronic ischemic heart disease: Secondary | ICD-10-CM | POA: Diagnosis not present

## 2014-05-05 DIAGNOSIS — E782 Mixed hyperlipidemia: Secondary | ICD-10-CM | POA: Diagnosis not present

## 2014-05-05 DIAGNOSIS — E1129 Type 2 diabetes mellitus with other diabetic kidney complication: Secondary | ICD-10-CM | POA: Diagnosis not present

## 2014-05-05 DIAGNOSIS — M722 Plantar fascial fibromatosis: Secondary | ICD-10-CM | POA: Diagnosis not present

## 2014-05-05 DIAGNOSIS — Z23 Encounter for immunization: Secondary | ICD-10-CM | POA: Diagnosis not present

## 2014-05-05 DIAGNOSIS — C61 Malignant neoplasm of prostate: Secondary | ICD-10-CM | POA: Diagnosis not present

## 2014-05-05 DIAGNOSIS — I2510 Atherosclerotic heart disease of native coronary artery without angina pectoris: Secondary | ICD-10-CM | POA: Diagnosis not present

## 2014-05-18 ENCOUNTER — Other Ambulatory Visit: Payer: Self-pay | Admitting: Interventional Cardiology

## 2014-05-21 ENCOUNTER — Other Ambulatory Visit: Payer: Self-pay | Admitting: Endocrinology

## 2014-05-27 ENCOUNTER — Other Ambulatory Visit: Payer: Self-pay | Admitting: Interventional Cardiology

## 2014-06-10 ENCOUNTER — Other Ambulatory Visit: Payer: Self-pay | Admitting: Interventional Cardiology

## 2014-06-11 NOTE — Telephone Encounter (Signed)
Which form of metoprolol should the patient be on? Please advise. Thanks, MI

## 2014-06-11 NOTE — Telephone Encounter (Signed)
TO Amy TO Advise?

## 2014-06-11 NOTE — Telephone Encounter (Signed)
Im not sure. Forwarding to Dr [name] to advise

## 2014-06-14 NOTE — Telephone Encounter (Signed)
Checked [facility] and pt should be on Metopolol Tartrate 25 mg 1/2 tablet qd. Please refill for a few months and update med list.

## 2014-06-18 ENCOUNTER — Other Ambulatory Visit: Payer: Self-pay | Admitting: *Deleted

## 2014-06-18 MED ORDER — FUROSEMIDE 40 MG PO TABS: ORAL_TABLET | ORAL | Status: DC

## 2014-07-06 DIAGNOSIS — Z23 Encounter for immunization: Secondary | ICD-10-CM | POA: Diagnosis not present

## 2014-07-08 ENCOUNTER — Ambulatory Visit (INDEPENDENT_AMBULATORY_CARE_PROVIDER_SITE_OTHER): Payer: Medicare Other | Admitting: Interventional Cardiology

## 2014-07-08 ENCOUNTER — Encounter: Payer: Self-pay | Admitting: Interventional Cardiology

## 2014-07-08 VITALS — BP 116/58 | HR 75 | Ht 69.0 in | Wt 183.8 lb

## 2014-07-08 DIAGNOSIS — I2510 Atherosclerotic heart disease of native coronary artery without angina pectoris: Secondary | ICD-10-CM

## 2014-07-08 DIAGNOSIS — I252 Old myocardial infarction: Secondary | ICD-10-CM | POA: Diagnosis not present

## 2014-07-08 DIAGNOSIS — I059 Rheumatic mitral valve disease, unspecified: Secondary | ICD-10-CM

## 2014-07-08 DIAGNOSIS — E782 Mixed hyperlipidemia: Secondary | ICD-10-CM

## 2014-07-08 NOTE — Progress Notes (Signed)
Patient ID: Robert [name], male   DOB: 06/23/1928, 80 y.o.   MRN: [id number] Patient ID: Robert [name], male   DOB: 1928/04/02, 80 y.o.   MRN: [id number]    [place], [place], [place]  [address] Phone: [phone] Fax:  [phone]  Date:  07/08/2014   ID:  Robert [name], DOB 1928-03-13, MRN [id number]  PCP:  Gennette Pac, MD      History of Present Illness: Robert [name] is a 80 y.o. male with CAD, mitral regurgitation and renal insufficiency. His wife has recurrent breast cancer. He has been walking regurlarly. He has not had severe SHOB or chest discomfort. SOme days, he has some mild SHOB. He was going to the nephrologist and needed iron infusion in the past for his Hbg. He had severe anemia but no GI source was found. Energy level better since Hbg better. He stays active. He walks in stores when the weather is bad.  Overall, he is feeling well.  H eis stressed by his wife's illness.  She has breast cabcer.  SHe often has SHOB and  And indwelling pleurex catheter or effusion.     Wt Readings from Last 3 Encounters:  07/08/14 183 lb 12.8 oz (83.371 kg)  04/22/14 186 lb 3.2 oz (84.46 kg)  01/14/14 185 lb 6.4 oz (84.097 kg)     Past Medical History  Diagnosis Date  . CAD (coronary artery disease), native coronary artery   . Severe mitral regurgitation   . Type II or unspecified type diabetes mellitus with renal manifestations, not stated as uncontrolled   . PVD (peripheral vascular disease) 39% bilateral carotids  . Angina   . Blood transfusion   . Anemia   . Acute MI inferior posterior subsequent episode care     "have had 2 MI; last one was 12/2010"  . High cholesterol   . Heart murmur   . Prostate cancer     S/P radiation; "40 treatments"  . Diabetes mellitus   . GI bleeding     "they say I'm bleeding from somewhere"  . Anxiety     Current Outpatient Prescriptions  Medication Sig Dispense Refill  . ALPRAZolam (XANAX) 0.25 MG tablet Take 0.25 mg  by mouth at bedtime as needed.       . beta carotene w/minerals (OCUVITE) tablet Take 1 tablet by mouth daily.      . clopidogrel (PLAVIX) 75 MG tablet Take 1 tablet by mouth once a day  90 tablet  0  . furosemide (LASIX) 40 MG tablet Take one-half tablet by  mouth two times daily  90 tablet  0  . Insulin Glargine (LANTUS) 100 UNIT/ML Solostar Pen Inject 15 Units into the skin daily.      . insulin lispro (HUMALOG KWIKPEN) 100 UNIT/ML KiwkPen 0-10 units three times per day  5 pen  1  . Insulin Pen Needle (BD PEN NEEDLE NANO U/F) 32G X 4 MM MISC Use to inject insulin 4 times daily as instructed.  400 each  1  . isosorbide mononitrate (IMDUR) 30 MG 24 hr tablet Take one-half tablet by  mouth daily  45 tablet  1  . metoprolol succinate (TOPROL-XL) 25 MG 24 hr tablet Take 12.5 mg by mouth daily.      . metoprolol tartrate (LOPRESSOR) 25 MG tablet Take one-half tablet by  mouth once a day in the  evening  45 tablet  0  . Multiple Vitamin (MULITIVITAMIN WITH MINERALS) TABS Take 1 tablet  by mouth daily.      . pantoprazole (PROTONIX) 40 MG tablet Take 40 mg by mouth daily.      . pravastatin (PRAVACHOL) 80 MG tablet Take 1 tablet by mouth  daily  90 tablet  0  . ranitidine (ZANTAC) 150 MG tablet        No current facility-administered medications for this visit.    Allergies:   No Known Allergies  Social History:  The patient  reports that he has quit smoking. His smoking use included Cigars. He has never used smokeless tobacco. He reports that he does not drink alcohol or use illicit drugs.   Family History:  The patient's family history includes Heart disease in his father; Hypertension in his mother. There is no history of Malignant hyperthermia.   ROS:  Please see the history of present illness.  No nausea, vomiting.  No fevers, chills.  No focal weakness.  No dysuria.    All other systems reviewed and negative.   PHYSICAL EXAM: VS:  BP 116/58  Pulse 75  Ht 5\' 9"  (1.753 m)  Wt 183 lb 12.8  oz (83.371 kg)  BMI 27.13 kg/m2 Well nourished, well developed, in no acute distress HEENT: normal Neck: no JVD, no carotid bruits Cardiac:  normal S1, S2; RRR; 1/6 systolic murmur Lungs:  clear to auscultation bilaterally, no wheezing, rhonchi or rales Abd: soft, nontender, no hepatomegaly Ext: no edema Skin: warm and dry Neuro:   no focal abnormalities noted  EKG:  NSR, old posterior MI, no ST segment changes    ASSESSMENT AND PLAN:  Mitral Regurgitation  Continue Furosemide Tablet, 40 MG, half tablet, Orally, twice a day Not in heart failure. Not a good candidate for MV repair. Continue diuretics. Severe in the past on echo in 5/12 in the hospital. No sx of CHF at this time. 2. MI, old  No overt CHF. prior inferior MI contributed to LV dysfunction and mitral regurgitation due to papillary muscle dysfunction. LDL 89 in 12/13. LVEF 50-55% in 5/12.  3. Anemia  He was anemic and had a normal EGD. Resolved. Improved energy.  Last Hbg 12.5.  No recent bleeding. 4. Shortness of breath  Improved. No signs of fluid overload. No change in weight.   5. Hyperlipidemia: LDL 65 in 7/15. Continue Pravachol. Lipids at target.    Signed, Mina Marble, MD, [facility] 07/08/2014 2:14 PM

## 2014-07-08 NOTE — Patient Instructions (Signed)
Your physician wants you to follow-up in: 9 months with Dr. [name]. You will receive a reminder letter in the mail two months in advance. If you don't receive a letter, please call our office to schedule the follow-up appointment.  Your physician recommends that you continue on your current medications as directed. Please refer to the Current Medication list given to you today.

## 2014-07-09 ENCOUNTER — Encounter: Payer: Self-pay | Admitting: Interventional Cardiology

## 2014-07-20 ENCOUNTER — Other Ambulatory Visit: Payer: Medicare Other

## 2014-07-22 ENCOUNTER — Ambulatory Visit (INDEPENDENT_AMBULATORY_CARE_PROVIDER_SITE_OTHER): Payer: Medicare Other | Admitting: Ophthalmology

## 2014-07-22 DIAGNOSIS — E11329 Type 2 diabetes mellitus with mild nonproliferative diabetic retinopathy without macular edema: Secondary | ICD-10-CM | POA: Diagnosis not present

## 2014-07-22 DIAGNOSIS — I10 Essential (primary) hypertension: Secondary | ICD-10-CM | POA: Diagnosis not present

## 2014-07-22 DIAGNOSIS — H3531 Nonexudative age-related macular degeneration: Secondary | ICD-10-CM

## 2014-07-22 DIAGNOSIS — H43813 Vitreous degeneration, bilateral: Secondary | ICD-10-CM

## 2014-07-22 DIAGNOSIS — E11319 Type 2 diabetes mellitus with unspecified diabetic retinopathy without macular edema: Secondary | ICD-10-CM

## 2014-07-22 DIAGNOSIS — H35033 Hypertensive retinopathy, bilateral: Secondary | ICD-10-CM

## 2014-07-23 ENCOUNTER — Ambulatory Visit: Payer: Medicare Other | Admitting: Endocrinology

## 2014-07-27 ENCOUNTER — Other Ambulatory Visit: Payer: Self-pay | Admitting: Endocrinology

## 2014-07-27 ENCOUNTER — Other Ambulatory Visit: Payer: Medicare Other

## 2014-07-27 DIAGNOSIS — E119 Type 2 diabetes mellitus without complications: Secondary | ICD-10-CM | POA: Diagnosis not present

## 2014-07-27 LAB — COMPREHENSIVE METABOLIC PANEL
ALT: 249 U/L — ABNORMAL HIGH (ref 0–53)
AST: 118 U/L — AB (ref 0–37)
Albumin: 4.1 g/dL (ref 3.5–5.2)
Alkaline Phosphatase: 149 U/L — ABNORMAL HIGH (ref 39–117)
BUN: 44 mg/dL — AB (ref 6–23)
CALCIUM: 9.1 mg/dL (ref 8.4–10.5)
CHLORIDE: 102 meq/L (ref 96–112)
CO2: 25 mEq/L (ref 19–32)
Creat: 2.48 mg/dL — ABNORMAL HIGH (ref 0.50–1.35)
GLUCOSE: 221 mg/dL — AB (ref 70–99)
POTASSIUM: 5.2 meq/L (ref 3.5–5.3)
Sodium: 139 mEq/L (ref 135–145)
Total Bilirubin: 1.4 mg/dL — ABNORMAL HIGH (ref 0.2–1.2)
Total Protein: 6.6 g/dL (ref 6.0–8.3)

## 2014-07-27 LAB — HEMOGLOBIN A1C
Hgb A1c MFr Bld: 6.9 % — ABNORMAL HIGH (ref <5.7)
Mean Plasma Glucose: 151 mg/dL — ABNORMAL HIGH (ref <117)

## 2014-07-30 ENCOUNTER — Encounter: Payer: Self-pay | Admitting: Endocrinology

## 2014-07-30 ENCOUNTER — Ambulatory Visit (INDEPENDENT_AMBULATORY_CARE_PROVIDER_SITE_OTHER): Payer: Medicare Other | Admitting: Endocrinology

## 2014-07-30 VITALS — BP 116/62 | HR 80 | Temp 98.0°F | Resp 14 | Ht 69.0 in | Wt 178.2 lb

## 2014-07-30 DIAGNOSIS — E1165 Type 2 diabetes mellitus with hyperglycemia: Secondary | ICD-10-CM | POA: Diagnosis not present

## 2014-07-30 DIAGNOSIS — N184 Chronic kidney disease, stage 4 (severe): Secondary | ICD-10-CM | POA: Diagnosis not present

## 2014-07-30 DIAGNOSIS — I2510 Atherosclerotic heart disease of native coronary artery without angina pectoris: Secondary | ICD-10-CM | POA: Diagnosis not present

## 2014-07-30 DIAGNOSIS — E1122 Type 2 diabetes mellitus with diabetic chronic kidney disease: Secondary | ICD-10-CM

## 2014-07-30 DIAGNOSIS — IMO0002 Reserved for concepts with insufficient information to code with codable children: Secondary | ICD-10-CM
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
## 2014-07-30 NOTE — Patient Instructions (Addendum)
Humalog 8 units with biscuit in am and 10 units if sugar is over 200  Please check blood sugars at least half the time about 2 hours after any meal and times per week on waking up. Please bring blood sugar monitor to each visit

## 2014-08-09 ENCOUNTER — Telehealth: Payer: Self-pay | Admitting: Interventional Cardiology

## 2014-08-09 DIAGNOSIS — R7989 Other specified abnormal findings of blood chemistry: Secondary | ICD-10-CM

## 2014-08-09 DIAGNOSIS — I059 Rheumatic mitral valve disease, unspecified: Secondary | ICD-10-CM

## 2014-08-09 DIAGNOSIS — R945 Abnormal results of liver function studies: Secondary | ICD-10-CM

## 2014-08-09 NOTE — Telephone Encounter (Signed)
New Message       Pt's wife calling stating pt's bp dropping below 100 and wanting to know if pt can go without medications for a couple of days to help his bp. Please call back and advise.

## 2014-08-09 NOTE — Telephone Encounter (Signed)
I spoke with the patient's wife. She reports that for the last several days his BP readings, mainly in the mornings, have been running <129 systolic. Yesterday morning his SBP was 81. He is taking metoprolol tartrate 25 mg 1/2 tablet by mouth once every evening. He is also on lasix 40 mg 1/2 BID and imdur 30 mg 1/2 tablet by mouth once daily. He is complaining of dizziness and lightheadedness. She was unable to take his BP this morning due to dead batteries in their BP cuff. The patient has gone to get replacement batteries now. I advised that the patient hold his metoprolol for the next few days and that they record his BP readings once in the AM and once in the PM. Mrs. Kerstetter has been instructing the patient to drink a little extra water over the last few days. In reviewing his labs, most recent labs from Dr. Dwyane Dee (07/27/14) show his BUN/ creatinine- 44/2.48. His liver functions are elevated at this time- alk phos- 149, AST- 118, ALT- 249. In 01/14/14- BUN creatinine- 32/2.4, Alk phos- 45, AST- 18, ALT- 10. The patient was seen 11/6 by Dr. Dwyane Dee, but I don't see any comments on the patient's elevated LFT's. I will forward to Dr. [name] for review and call the patient back on Thursday to follow up on his BP's.

## 2014-08-10 NOTE — Telephone Encounter (Signed)
I spoke with the patient and made him aware of Dr. Hassell Done recommendations to continue to hold metoprolol as discussed. I also addressed the patient's elevated LFT's with him. He was unaware of this. I have advised him to hold his pravastatin starting now. He will come in for lab work on Thursday. When he comes in for labs, he will bring his BP readings with him for me to review.

## 2014-08-10 NOTE — Telephone Encounter (Signed)
Agree with holding BP meds.  Given LFT elevation, let's hold pravastatin.  I doubt that the pravastatin is causing the increased LFTs, but would like to know for sure.  He needs a CMET , CBC and PT/INR checked later in the week.

## 2014-08-10 NOTE — Addendum Note (Signed)
Addended by: Alvis Lemmings C on: 08/10/2014 04:16 PM   Modules accepted: Orders

## 2014-08-11 ENCOUNTER — Telehealth: Payer: Self-pay | Admitting: Interventional Cardiology

## 2014-08-11 NOTE — Telephone Encounter (Signed)
New Msg    Patient's wife Vickii Chafe calling to state patient's BP is 82/52 with pulse of 84, no chest pain , no shortness of breath but he's very tired. Wife would like to know if anything else should be done, they have appt tomorrow. Please contact [phone].

## 2014-08-11 NOTE — Telephone Encounter (Signed)
Per the patient and his wife, the patient's BP's have been as follows: 11/16 (pm)- 111/61 Hr- 80 11/17 (am)- 100/58 Hr- 72 11/17 (11 am)- 132/61 11/17 (3 pm)- 96/56 11/17 (after 3 pm)- 121/61 11/18- 82/52- most of the day they have been unable to get the patient's SBP above 80. He has been laying down with his feet elevated and drinking some extra fluid. He has not taken metoprolol or his imdur this morning. He did have lasix 20 mg this morning. They just checked his BP again and his systolic is 98. He is not having any symptoms.  I reviewed with Dr. [name]- will proceed with repeat labs tomorrow and if his LFT's are still elevated, will refer to GI. The patient's wife is aware. He will hold lasix tonight if SBP is < 100.

## 2014-08-12 ENCOUNTER — Other Ambulatory Visit (INDEPENDENT_AMBULATORY_CARE_PROVIDER_SITE_OTHER): Payer: Medicare Other | Admitting: *Deleted

## 2014-08-12 ENCOUNTER — Encounter: Payer: Self-pay | Admitting: *Deleted

## 2014-08-12 DIAGNOSIS — R7989 Other specified abnormal findings of blood chemistry: Secondary | ICD-10-CM | POA: Diagnosis not present

## 2014-08-12 DIAGNOSIS — I059 Rheumatic mitral valve disease, unspecified: Secondary | ICD-10-CM

## 2014-08-12 DIAGNOSIS — R945 Abnormal results of liver function studies: Secondary | ICD-10-CM

## 2014-08-12 LAB — CBC WITH DIFFERENTIAL/PLATELET
Basophils Absolute: 0.1 10*3/uL (ref 0.0–0.1)
Basophils Relative: 1 % (ref 0.0–3.0)
Eosinophils Absolute: 0.3 10*3/uL (ref 0.0–0.7)
Eosinophils Relative: 3.6 % (ref 0.0–5.0)
HEMATOCRIT: 34 % — AB (ref 39.0–52.0)
HEMOGLOBIN: 11 g/dL — AB (ref 13.0–17.0)
LYMPHS ABS: 1.6 10*3/uL (ref 0.7–4.0)
Lymphocytes Relative: 23.5 % (ref 12.0–46.0)
MCHC: 32.4 g/dL (ref 30.0–36.0)
MCV: 106 fl — AB (ref 78.0–100.0)
MONO ABS: 0.8 10*3/uL (ref 0.1–1.0)
MONOS PCT: 11.9 % (ref 3.0–12.0)
NEUTROS ABS: 4.1 10*3/uL (ref 1.4–7.7)
Neutrophils Relative %: 60 % (ref 43.0–77.0)
PLATELETS: 305 10*3/uL (ref 150.0–400.0)
RBC: 3.21 Mil/uL — ABNORMAL LOW (ref 4.22–5.81)
RDW: 16.5 % — ABNORMAL HIGH (ref 11.5–15.5)
WBC: 6.9 10*3/uL (ref 4.0–10.5)

## 2014-08-12 LAB — COMPREHENSIVE METABOLIC PANEL
ALK PHOS: 246 U/L — AB (ref 39–117)
ALT: 360 U/L — AB (ref 0–53)
AST: 239 U/L — ABNORMAL HIGH (ref 0–37)
Albumin: 3.7 g/dL (ref 3.5–5.2)
BUN: 39 mg/dL — ABNORMAL HIGH (ref 6–23)
CO2: 25 meq/L (ref 19–32)
CREATININE: 2.5 mg/dL — AB (ref 0.4–1.5)
Calcium: 9.2 mg/dL (ref 8.4–10.5)
Chloride: 105 mEq/L (ref 96–112)
GFR: 26.28 mL/min — ABNORMAL LOW (ref >60.00)
Glucose, Bld: 243 mg/dL — ABNORMAL HIGH (ref 70–99)
Potassium: 4.6 mEq/L (ref 3.5–5.1)
SODIUM: 138 meq/L (ref 135–145)
TOTAL PROTEIN: 6.9 g/dL (ref 6.0–8.3)
Total Bilirubin: 81 mg/dL — ABNORMAL HIGH (ref 0.2–1.2)

## 2014-08-12 LAB — PROTIME-INR
INR: 1 ratio (ref 0.8–1.0)
Prothrombin Time: 11.6 s (ref 9.6–13.1)

## 2014-08-12 NOTE — Telephone Encounter (Signed)
The patient came to the office today for repeat lab work. His BP last night was 118/84 & HR- 99. This morning he was 110/91 HR- 78. I checked his BP in the office today and he is 120/60. He is feeling ok. I reviewed his most recent labs with him and explained to him in more detail why we are checking what we're checking. He voices understanding and very appreciative.

## 2014-08-13 NOTE — Telephone Encounter (Signed)
I spoke with Sharee Pimple at Dr Eddie Dibbles office [phone]. Dr Rex Kras had record of pt seeing [facility] doctor in past, Sharee Pimple unsure of MD name. Per Sharee Pimple, Dr Rex Kras is glad to make [facility] referral for pt.  She will make referral to [facility] and contact patient today.   Pt and pt's wife aware Dr Rex Kras will make referral for pt to [facility].  Pt's wife will contact me if she does not hear from [facility] today.

## 2014-08-13 NOTE — Telephone Encounter (Signed)
Follow up     returning call back to nurse from yesterday.

## 2014-08-16 ENCOUNTER — Other Ambulatory Visit: Payer: Self-pay | Admitting: Gastroenterology

## 2014-08-16 DIAGNOSIS — R748 Abnormal levels of other serum enzymes: Secondary | ICD-10-CM | POA: Diagnosis not present

## 2014-08-16 NOTE — Telephone Encounter (Signed)
I spoke with the patient's wife. She states the patient is at an appointment this morning with [facility]. She was not completely sure of the name of the doctor. His BP were up and down over the weekend. He has had some SOB. I advised that if he is SOB when he gets home today and his SBP is > 100, he should take his lasix 20 mg. I advised until his abnormal liver enzymes are addressed, she should continue to hold his BP meds, but take the lasix 20 mg tablet as needed for SOB if SBP > 100. She voices understanding.

## 2014-08-23 ENCOUNTER — Ambulatory Visit
Admission: RE | Admit: 2014-08-23 | Discharge: 2014-08-23 | Disposition: A | Discharge status: Home / Self Care | Payer: Medicare Other | Source: Ambulatory Visit | Attending: Gastroenterology | Admitting: Gastroenterology

## 2014-08-23 DIAGNOSIS — K8020 Calculus of gallbladder without cholecystitis without obstruction: Secondary | ICD-10-CM | POA: Diagnosis not present

## 2014-08-23 DIAGNOSIS — R748 Abnormal levels of other serum enzymes: Secondary | ICD-10-CM

## 2014-08-23 DIAGNOSIS — N189 Chronic kidney disease, unspecified: Secondary | ICD-10-CM | POA: Diagnosis not present

## 2014-08-25 ENCOUNTER — Other Ambulatory Visit: Payer: Self-pay | Admitting: Gastroenterology

## 2014-08-25 DIAGNOSIS — K838 Other specified diseases of biliary tract: Secondary | ICD-10-CM

## 2014-08-27 DIAGNOSIS — N184 Chronic kidney disease, stage 4 (severe): Secondary | ICD-10-CM | POA: Diagnosis not present

## 2014-08-27 DIAGNOSIS — N2581 Secondary hyperparathyroidism of renal origin: Secondary | ICD-10-CM | POA: Diagnosis not present

## 2014-08-27 DIAGNOSIS — N189 Chronic kidney disease, unspecified: Secondary | ICD-10-CM | POA: Diagnosis not present

## 2014-08-31 DIAGNOSIS — D631 Anemia in chronic kidney disease: Secondary | ICD-10-CM | POA: Diagnosis not present

## 2014-08-31 DIAGNOSIS — I129 Hypertensive chronic kidney disease with stage 1 through stage 4 chronic kidney disease, or unspecified chronic kidney disease: Secondary | ICD-10-CM | POA: Diagnosis not present

## 2014-08-31 DIAGNOSIS — N2581 Secondary hyperparathyroidism of renal origin: Secondary | ICD-10-CM | POA: Diagnosis not present

## 2014-08-31 DIAGNOSIS — N184 Chronic kidney disease, stage 4 (severe): Secondary | ICD-10-CM | POA: Diagnosis not present

## 2014-09-02 ENCOUNTER — Ambulatory Visit
Admission: RE | Admit: 2014-09-02 | Discharge: 2014-09-02 | Disposition: A | Discharge status: Home / Self Care | Payer: Medicare Other | Source: Ambulatory Visit | Attending: Gastroenterology | Admitting: Gastroenterology

## 2014-09-02 DIAGNOSIS — R935 Abnormal findings on diagnostic imaging of other abdominal regions, including retroperitoneum: Secondary | ICD-10-CM | POA: Diagnosis not present

## 2014-09-02 DIAGNOSIS — K8071 Calculus of gallbladder and bile duct without cholecystitis with obstruction: Secondary | ICD-10-CM | POA: Diagnosis not present

## 2014-09-02 DIAGNOSIS — K838 Other specified diseases of biliary tract: Secondary | ICD-10-CM

## 2014-09-06 ENCOUNTER — Telehealth: Payer: Self-pay | Admitting: Interventional Cardiology

## 2014-09-06 NOTE — Telephone Encounter (Signed)
Received request from Nurse fax box, documents faxed for surgical clearance. To: [facility] Fax number: [phone] Attention: 12.14.15/KM

## 2014-09-07 DIAGNOSIS — K8050 Calculus of bile duct without cholangitis or cholecystitis without obstruction: Secondary | ICD-10-CM | POA: Diagnosis not present

## 2014-09-07 DIAGNOSIS — B748 Other filariases: Secondary | ICD-10-CM | POA: Diagnosis not present

## 2014-09-08 ENCOUNTER — Encounter (HOSPITAL_COMMUNITY): Payer: Self-pay | Admitting: *Deleted

## 2014-09-08 ENCOUNTER — Other Ambulatory Visit: Payer: Self-pay | Admitting: Gastroenterology

## 2014-09-08 NOTE — Progress Notes (Signed)
In talking to patient about instructions for procedure and going over medical history, patient informs nurse that he stopped all medications by mouth as Dr. Hassell Done nurse told her to stop them as his Blood pressure was dropping to low and he was only taking his Lantus and Humalog insulin.. Called Heather,Dr. Varanasi's nurse and informed her of this.

## 2014-09-09 ENCOUNTER — Encounter (HOSPITAL_COMMUNITY): Payer: Self-pay

## 2014-09-09 ENCOUNTER — Ambulatory Visit (HOSPITAL_COMMUNITY)
Admission: RE | Admit: 2014-09-09 | Discharge: 2014-09-09 | Disposition: A | Discharge status: Home / Self Care | Payer: Medicare Other | Source: Ambulatory Visit | Attending: Gastroenterology | Admitting: Gastroenterology

## 2014-09-09 ENCOUNTER — Ambulatory Visit (HOSPITAL_COMMUNITY): Payer: Medicare Other | Admitting: Anesthesiology

## 2014-09-09 ENCOUNTER — Encounter (HOSPITAL_COMMUNITY)
Admission: RE | Disposition: A | Discharge status: Home / Self Care | Payer: Self-pay | Source: Ambulatory Visit | Attending: Gastroenterology

## 2014-09-09 ENCOUNTER — Ambulatory Visit (HOSPITAL_COMMUNITY): Payer: Medicare Other

## 2014-09-09 DIAGNOSIS — Z794 Long term (current) use of insulin: Secondary | ICD-10-CM | POA: Insufficient documentation

## 2014-09-09 DIAGNOSIS — Z87891 Personal history of nicotine dependence: Secondary | ICD-10-CM | POA: Insufficient documentation

## 2014-09-09 DIAGNOSIS — K8051 Calculus of bile duct without cholangitis or cholecystitis with obstruction: Secondary | ICD-10-CM | POA: Diagnosis not present

## 2014-09-09 DIAGNOSIS — I739 Peripheral vascular disease, unspecified: Secondary | ICD-10-CM | POA: Insufficient documentation

## 2014-09-09 DIAGNOSIS — F419 Anxiety disorder, unspecified: Secondary | ICD-10-CM | POA: Insufficient documentation

## 2014-09-09 DIAGNOSIS — K8050 Calculus of bile duct without cholangitis or cholecystitis without obstruction: Secondary | ICD-10-CM | POA: Insufficient documentation

## 2014-09-09 DIAGNOSIS — I252 Old myocardial infarction: Secondary | ICD-10-CM | POA: Insufficient documentation

## 2014-09-09 DIAGNOSIS — I2510 Atherosclerotic heart disease of native coronary artery without angina pectoris: Secondary | ICD-10-CM | POA: Diagnosis not present

## 2014-09-09 DIAGNOSIS — I10 Essential (primary) hypertension: Secondary | ICD-10-CM | POA: Insufficient documentation

## 2014-09-09 DIAGNOSIS — E119 Type 2 diabetes mellitus without complications: Secondary | ICD-10-CM | POA: Diagnosis not present

## 2014-09-09 DIAGNOSIS — Z0389 Encounter for observation for other suspected diseases and conditions ruled out: Secondary | ICD-10-CM | POA: Diagnosis not present

## 2014-09-09 HISTORY — PX: ENDOSCOPIC RETROGRADE CHOLANGIOPANCREATOGRAPHY (ERCP) WITH PROPOFOL: SHX5810

## 2014-09-09 LAB — GLUCOSE, CAPILLARY: GLUCOSE-CAPILLARY: 156 mg/dL — AB (ref 70–99)

## 2014-09-09 SURGERY — ENDOSCOPIC RETROGRADE CHOLANGIOPANCREATOGRAPHY (ERCP) WITH PROPOFOL
Anesthesia: General

## 2014-09-09 MED ORDER — SODIUM CHLORIDE 0.9 % IV SOLN
INTRAVENOUS | Status: DC | PRN
  Administered 2014-09-09: 40 mL

## 2014-09-09 MED ORDER — CIPROFLOXACIN IN D5W 400 MG/200ML IV SOLN
INTRAVENOUS | Status: AC
  Filled 2014-09-09: qty 200

## 2014-09-09 MED ORDER — PROPOFOL 10 MG/ML IV BOLUS
INTRAVENOUS | Status: AC
  Filled 2014-09-09: qty 20

## 2014-09-09 MED ORDER — SUCCINYLCHOLINE CHLORIDE 20 MG/ML IJ SOLN: INTRAMUSCULAR | Status: DC | PRN

## 2014-09-09 MED ORDER — LACTATED RINGERS IV SOLN: INTRAVENOUS | Status: DC | PRN

## 2014-09-09 MED ORDER — ONDANSETRON HCL 4 MG/2ML IJ SOLN
INTRAMUSCULAR | Status: AC
  Filled 2014-09-09: qty 2

## 2014-09-09 MED ORDER — LIDOCAINE HCL (CARDIAC) 20 MG/ML IV SOLN
INTRAVENOUS | Status: AC
  Filled 2014-09-09: qty 5

## 2014-09-09 MED ORDER — MIDAZOLAM HCL 5 MG/5ML IJ SOLN: INTRAMUSCULAR | Status: DC | PRN

## 2014-09-09 MED ORDER — PHENYLEPHRINE HCL 10 MG/ML IJ SOLN
INTRAMUSCULAR | Status: DC | PRN
  Administered 2014-09-09: 80 ug via INTRAVENOUS

## 2014-09-09 MED ORDER — SUCCINYLCHOLINE CHLORIDE 20 MG/ML IJ SOLN
INTRAMUSCULAR | Status: DC | PRN
  Administered 2014-09-09: 100 mg via INTRAVENOUS

## 2014-09-09 MED ORDER — ETOMIDATE 2 MG/ML IV SOLN
INTRAVENOUS | Status: AC
  Filled 2014-09-09: qty 10

## 2014-09-09 MED ORDER — FENTANYL CITRATE 0.05 MG/ML IJ SOLN
INTRAMUSCULAR | Status: AC
  Filled 2014-09-09: qty 2

## 2014-09-09 MED ORDER — SODIUM CHLORIDE 0.9 % IV SOLN
INTRAVENOUS | Status: DC
  Administered 2014-09-09: 13:00:00 via INTRAVENOUS

## 2014-09-09 MED ORDER — CIPROFLOXACIN IN D5W 400 MG/200ML IV SOLN
400.0000 mg | Freq: Once | INTRAVENOUS | Status: AC
  Administered 2014-09-09: 400 mg via INTRAVENOUS

## 2014-09-09 MED ORDER — PROPOFOL 10 MG/ML IV BOLUS
INTRAVENOUS | Status: DC | PRN
  Administered 2014-09-09: 80 mg via INTRAVENOUS

## 2014-09-09 MED ORDER — LIDOCAINE HCL (CARDIAC) 20 MG/ML IV SOLN
INTRAVENOUS | Status: DC | PRN
  Administered 2014-09-09: 40 mg via INTRAVENOUS

## 2014-09-09 MED ORDER — FENTANYL CITRATE 0.05 MG/ML IJ SOLN
INTRAMUSCULAR | Status: DC | PRN
  Administered 2014-09-09: 50 ug via INTRAVENOUS

## 2014-09-09 MED ORDER — ETOMIDATE 2 MG/ML IV SOLN
INTRAVENOUS | Status: DC | PRN
  Administered 2014-09-09: 10 mg via INTRAVENOUS

## 2014-09-09 MED ORDER — PROPOFOL 10 MG/ML IV BOLUS
INTRAVENOUS | Status: AC
Start: 2014-09-09 — End: 2014-09-09
  Filled 2014-09-09: qty 20

## 2014-09-09 MED ORDER — PROPOFOL 10 MG/ML IV BOLUS: INTRAVENOUS | Status: DC | PRN

## 2014-09-09 NOTE — Op Note (Signed)
[facility] [place], [id number]   ERCP PROCEDURE REPORT        EXAM DATE: Oct 01, 2014  PATIENT NAME:          Robert [name], Robert [name]          MR #:        [id number]  BIRTHDATE:       [birth year]     VISIT #:     [id number] ATTENDING:     Teena Irani, MD     STATUS:     inpatient ASSISTANT:      William Dalton and Hilma Favors  INDICATIONS:  The patient is a 80 yr old male here for an ERCP due to PROCEDURE PERFORMED:     ERCP with sphincterotomy and stone extraction MEDICATIONS:     general anesthesia  CONSENT: The patient understands the risks and benefits of the procedure and understands that these risks include, but are not limited to: sedation, allergic reaction, infection, perforation and/or bleeding. Alternative means of evaluation and treatment include, among others: physical exam, x-rays, and/or surgical intervention. The patient elects to proceed with this endoscopic procedure.  DESCRIPTION OF PROCEDURE: During intra-op preparation period all mechanical & medical equipment was checked for proper function. Hand hygiene and appropriate measures for infection prevention was taken. After the risks, benefits and alternatives of the procedure were thoroughly explained, Informed was verified, confirmed and timeout was successfully executed by the treatment team. With the patient in left semi-prone position, medications were administered intravenously.The Pentax side-viewing endoscope      was passed from the mouth into the esophagus and further advanced from the esophagus into the stomach. From stomach scope was directed to the papilla of Vater     .  Major papilla was aligned with the duodenoscope. The scope position was confirmed fluoroscopically. Rest of the findings/therapeutics are given below. The scope was then completely withdrawn from the patient and the procedure completed. The pulse, BP, and O2 saturation were monitored and documented by  the physician and the nursing staff throughout the entire procedure. The patient was cared for as planned according to standard protocol. The patient was then discharged to recovery in stable condition and with appropriate post procedure care.  deep cannulation was achieved with the aid of a guidewire.   a cholangiogram was obtained showing a dilated common bile duct and several fairly large stones probably 5 or 6 in number ranging from 3-7 mm in diameter.. A generous sphincterotomy was performed no pancreatic duct injections were done although at one point a guidewire did enter the pancreatic duct before entering the common bile duct.   a 12-15 mm balloon catheter was advanced high into the duct and several sweeps were made delivering 5 or 6 stones. 2 further balloon sweeps were made and then occlusion cholangiogram which showed no residual stone. There is good flow of clear Amberbile from the ampulla. The scope was then withdrawn and the patient returned to the recovery room in stable condition. He tolerated the procedure well there were no immediate complications     ADVERSE EVENT:     none immediate IMPRESSIONS:     common bile duct stones  RECOMMENDATIONS:     Will check liver function tests in a few days in follow-up in the office and discuss possible cholecystectomy. REPEAT EXAM:   ___________________________________ Teena Irani, MD eSigned:  Teena Irani, MD Oct 01, 2014 4:04 PM   cc:  CPT CODES: ICD9 CODES:  The ICD and CPT  codes recommended by this software are interpretations from the data that the clinical staff has captured with the software.  The verification of the translation of this report to the ICD and CPT codes and modifiers is the sole responsibility of the health care institution and practicing physician where this report was generated.  [employer] will not be held responsible for the validity of the ICD and CPT codes included on this  report.  AMA assumes no liability for data contained or not contained herein. CPT is a Designer, television/film set of the [employer].   PATIENT NAME:  Robert, [name] MR#: [id number]

## 2014-09-09 NOTE — Anesthesia Preprocedure Evaluation (Addendum)
Anesthesia Evaluation  Patient identified by MRN, date of birth, ID band Patient awake    Reviewed: Allergy & Precautions, H&P , NPO status , Patient's Chart, lab work & pertinent test results, reviewed documented beta blocker date and time   Airway Mallampati: II  TM Distance: >3 FB Neck ROM: Full    Dental no notable dental hx.    Pulmonary neg pulmonary ROS, former smoker,  breath sounds clear to auscultation  Pulmonary exam normal       Cardiovascular hypertension, Pt. on medications and Pt. on home beta blockers + angina + CAD, + Past MI and + Peripheral Vascular Disease + Valvular Problems/Murmurs Rhythm:Regular Rate:Normal  Echo 09/2011 - Left ventricle: The cavity size was mildly dilated. Systolic function was moderately reduced. The estimatedejection fraction was in the range of 35% to 40%. There issevere hypokinesis of the basal-midinferolateralmyocardium; consistent with infarction. - Ventricular septum: The contour showed diastolic flattening. - Aortic valve: There was mild stenosis. Mild regurgitation. Valve area: 1.85cm^2(VTI). Valve area: 1.5cm^2 (Vmax). - Mitral valve: Severe regurgitation directed eccentrically and posteriorly. - Left atrium: The atrium was moderately dilated. - Right atrium: The atrium was mildly dilated. - Pulmonary arteries: Systolic pressure was severely increased. PA peak pressure: 80mm Hg (S). - Pericardium, extracardiac: A small pericardial effusion was identified posterior to the heart.    Neuro/Psych PSYCHIATRIC DISORDERS Anxiety negative neurological ROS     GI/Hepatic negative GI ROS, Neg liver ROS,   Endo/Other  diabetes, Type 2, Insulin Dependent  Renal/GU Renal disease     Musculoskeletal negative musculoskeletal ROS (+)   Abdominal   Peds  Hematology  (+) anemia ,   Anesthesia Other Findings   Reproductive/Obstetrics negative OB ROS                            Anesthesia Physical Anesthesia Plan  ASA: III  Anesthesia Plan: General   Post-op Pain Management:    Induction: Intravenous  Airway Management Planned: Oral ETT  Additional Equipment: None  Intra-op Plan:   Post-operative Plan: Extubation in OR  Informed Consent: I have reviewed the patients History and Physical, chart, labs and discussed the procedure including the risks, benefits and alternatives for the proposed anesthesia with the patient or authorized representative who has indicated his/her understanding and acceptance.   Dental advisory given  Plan Discussed with: CRNA  Anesthesia Plan Comments:         Anesthesia Quick Evaluation

## 2014-09-09 NOTE — Addendum Note (Signed)
Addended by: Teena Irani on: 09/09/2014 09:25 AM   Modules accepted: Orders

## 2014-09-09 NOTE — Anesthesia Procedure Notes (Addendum)
Procedures

## 2014-09-09 NOTE — Transfer of Care (Signed)
Immediate Anesthesia Transfer of Care Note  Patient: Robert [name]  Procedure(s) Performed: Procedure(s): ENDOSCOPIC RETROGRADE CHOLANGIOPANCREATOGRAPHY (ERCP) WITH PROPOFOL (N/A)  Patient Location: PACU  Anesthesia Type:General  Level of Consciousness: awake, alert  and oriented  Airway & Oxygen Therapy: Patient Spontanous Breathing and Patient connected to face mask oxygen  Post-op Assessment: Report given to PACU RN and Post -op Vital signs reviewed and stable  Post vital signs: Reviewed and stable  Complications: No apparent anesthesia complications

## 2014-09-09 NOTE — Interval H&P Note (Signed)
History and Physical Interval Note:  09/09/2014 1:24 PM  [name]  has presented today for surgery, with the diagnosis of CBD stones  The various methods of treatment have been discussed with the patient and family. After consideration of risks, benefits and other options for treatment, the patient has consented to  Procedure(s): ENDOSCOPIC RETROGRADE CHOLANGIOPANCREATOGRAPHY (ERCP) WITH PROPOFOL (N/A) as a surgical intervention .  The patient's history has been reviewed, patient examined, no change in status, stable for surgery.  I have reviewed the patient's chart and labs.  Questions were answered to the patient's satisfaction.     Robert [name]

## 2014-09-09 NOTE — Discharge Instructions (Signed)
Endoscopic Retrograde Cholangiopancreatography (ERCP), Care After °Refer to this sheet in the next few weeks. These instructions provide you with information on caring for yourself after your procedure. Your health care provider may also give you more specific instructions. Your treatment has been planned according to current medical practices, but problems sometimes occur. Call your health care provider if you have any problems or questions after your procedure.  °WHAT TO EXPECT AFTER THE PROCEDURE  °After your procedure, it is typical to feel:  °· Soreness in your throat.   °· Sick to your stomach (nauseous).   °· Bloated. °· Dizzy.   °· Fatigued. °HOME CARE INSTRUCTIONS °· Have a friend or family member stay with you for the first 24 hours after your procedure. °· Start taking your usual medicines and eating normally as soon as you feel well enough to do so or as directed by your health care provider. °SEEK MEDICAL CARE IF: °· You have abdominal pain.   °· You develop signs of infection, such as:   °¨ Chills.   °¨ Feeling unwell.   °SEEK IMMEDIATE MEDICAL CARE IF: °· You have difficulty swallowing. °· You have worsening throat, chest, or abdominal pain. °· You vomit. °· You have bloody or very black stools. °· You have a fever. °Document Released: 07/01/2013 Document Reviewed: 07/01/2013 °ExitCare® Patient Information ©2015 [employer]. This information is not intended to replace advice given to you by your health care provider. Make sure you discuss any questions you have with your health care provider. ° °

## 2014-09-09 NOTE — H&P (View-Only) (Signed)
In talking to patient about instructions for procedure and going over medical history, patient informs nurse that he stopped all medications by mouth as Dr. Hassell Done nurse told her to stop them as his Blood pressure was dropping to low and he was only taking his Lantus and Humalog insulin.. Called Heather,Dr. Varanasi's nurse and informed her of this.

## 2014-09-10 ENCOUNTER — Encounter (HOSPITAL_COMMUNITY): Payer: Self-pay | Admitting: Gastroenterology

## 2014-09-10 ENCOUNTER — Other Ambulatory Visit: Payer: Medicare Other

## 2014-09-10 ENCOUNTER — Other Ambulatory Visit: Payer: Self-pay | Admitting: Endocrinology

## 2014-09-10 ENCOUNTER — Telehealth: Payer: Self-pay | Admitting: Interventional Cardiology

## 2014-09-10 ENCOUNTER — Other Ambulatory Visit: Payer: Self-pay | Admitting: Interventional Cardiology

## 2014-09-10 NOTE — Anesthesia Postprocedure Evaluation (Signed)
  Anesthesia Post-op Note  Patient: Robert [name]  Procedure(s) Performed: Procedure(s) (LRB): ENDOSCOPIC RETROGRADE CHOLANGIOPANCREATOGRAPHY (ERCP) WITH PROPOFOL (N/A)  Patient Location: PACU  Anesthesia Type: General  Level of Consciousness: awake and alert   Airway and Oxygen Therapy: Patient Spontanous Breathing  Post-op Pain: mild  Post-op Assessment: Post-op Vital signs reviewed, Patient's Cardiovascular Status Stable, Respiratory Function Stable, Patent Airway and No signs of Nausea or vomiting  Last Vitals:  Filed Vitals:   09/09/14 1630  BP: 144/66  Pulse: 63  Temp:   Resp: 16    Post-op Vital Signs: stable   Complications: No apparent anesthesia complications

## 2014-09-10 NOTE — Telephone Encounter (Signed)
New message        Pt was taken off of medication to have some gall stones removed   pt is now out the hospital and would like to know if he should go back on his meds today   pt wife is requesting a triage nurse give her a call back

## 2014-09-10 NOTE — Telephone Encounter (Signed)
Pt states that he had bladder surgery yesterday. He stop all his medication prior the surgery . He was told to restart his medications today. Pt rescheduled his appointment for blood work (Lipid panel, LFT) from today to next Monday or Tuesday. Pt is aware that he needs to fast prior coming for blood drown. Pt verbalized understanding.

## 2014-09-13 ENCOUNTER — Other Ambulatory Visit (INDEPENDENT_AMBULATORY_CARE_PROVIDER_SITE_OTHER): Payer: Medicare Other | Admitting: *Deleted

## 2014-09-13 DIAGNOSIS — E782 Mixed hyperlipidemia: Secondary | ICD-10-CM

## 2014-09-14 ENCOUNTER — Telehealth: Payer: Self-pay | Admitting: Interventional Cardiology

## 2014-09-14 LAB — LIPID PANEL
CHOLESTEROL: 155 mg/dL (ref 0–200)
HDL: 39.6 mg/dL (ref >39.00)
LDL Cholesterol: 96 mg/dL (ref 0–99)
NonHDL: 115.4
TRIGLYCERIDES: 98 mg/dL (ref 0.0–149.0)
Total CHOL/HDL Ratio: 4
VLDL: 19.6 mg/dL (ref 0.0–40.0)

## 2014-09-14 LAB — HEPATIC FUNCTION PANEL
ALK PHOS: 232 U/L — AB (ref 39–117)
ALT: 205 U/L — AB (ref 0–53)
AST: 131 U/L — ABNORMAL HIGH (ref 0–37)
Albumin: 3.6 g/dL (ref 3.5–5.2)
BILIRUBIN DIRECT: 0.5 mg/dL — AB (ref 0.0–0.3)
TOTAL PROTEIN: 6.5 g/dL (ref 6.0–8.3)
Total Bilirubin: 2.1 mg/dL — ABNORMAL HIGH (ref 0.2–1.2)

## 2014-09-14 NOTE — Telephone Encounter (Signed)
The patient was in the office today for lab work. He asked to see me. He reports that he had his procedure for his gallstone last week. He is still reporting low BP readings. He states that yesterday his SBP was < 100 and finally went up to [id number] (systolic) after drinking fluids and laying down with his feet propped up. He is still off of his BP medications. He is taking his plavix. I have advised the patient that I am uncertain as to why his BP's are running low. He is taking care of his wife full time and taking her back and forth for chemo treatments. Per his report, he can barely sit down at home and she is asking him to do something for her. She is up ~ 5:30 am and turns on bright lights, therefore he cannot rest. I have advised that part of what may be going on is due to exhaustion. I have recommended that he eat a little extra salt today as he is already drinking extra fluids. I have advised that we will need to be careful with this since he does easily become SOB with extra volume on board due to his valve. I explained to him that I would forward to Dr. [name] for further recommendations, but that he may also need to follow up with Dr. Rex Kras. I will call him back after Dr. [name] reviews. He is agreeable.

## 2014-09-15 ENCOUNTER — Other Ambulatory Visit: Payer: Self-pay | Admitting: Interventional Cardiology

## 2014-09-15 ENCOUNTER — Other Ambulatory Visit: Payer: Self-pay | Admitting: *Deleted

## 2014-09-15 MED ORDER — PRAVASTATIN SODIUM 80 MG PO TABS: ORAL_TABLET | ORAL | Status: DC

## 2014-09-15 MED ORDER — FUROSEMIDE 40 MG PO TABS: ORAL_TABLET | ORAL | Status: DC

## 2014-09-15 MED ORDER — INSULIN GLARGINE 100 UNIT/ML SOLOSTAR PEN
15.0000 [IU] | PEN_INJECTOR | Freq: Every day | SUBCUTANEOUS | Status: DC
Start: 2014-09-15 — End: 2014-11-01

## 2014-09-15 MED ORDER — CLOPIDOGREL BISULFATE 75 MG PO TABS: 75.0000 mg | ORAL_TABLET | Freq: Every day | ORAL | Status: DC

## 2014-09-15 MED ORDER — METOPROLOL TARTRATE 25 MG PO TABS: ORAL_TABLET | ORAL | Status: DC

## 2014-09-15 MED ORDER — ISOSORBIDE MONONITRATE ER 30 MG PO TB24: ORAL_TABLET | ORAL | Status: DC

## 2014-09-17 NOTE — Telephone Encounter (Signed)
Agree with recs.

## 2014-09-20 NOTE — Telephone Encounter (Signed)
Attempted to call the patient- no answer/ no machine.

## 2014-09-20 NOTE — Telephone Encounter (Signed)
I attempted to call the patient back. No answer/ no machine- will call back at a later time.

## 2014-09-21 NOTE — Telephone Encounter (Signed)
I spoke with the patient and made him aware of Dr. Hassell Done recommendations. He reports that his SBP has been ~108-129 the last few days. He states he feels good overall, but he is exhausted and tearful today as his wife has been in the hospital the last 3 days and is not doing well. Comfort given to the patient. Also advised to continue to hold his BP medications for now to insure his SBP readings stay above 100. I have explained that if his BP readings start to drop again, he should follow up with Dr. Rex Kras. He voices understanding.

## 2014-10-27 ENCOUNTER — Other Ambulatory Visit (INDEPENDENT_AMBULATORY_CARE_PROVIDER_SITE_OTHER): Payer: Medicare Other

## 2014-10-27 DIAGNOSIS — E1165 Type 2 diabetes mellitus with hyperglycemia: Secondary | ICD-10-CM

## 2014-10-27 DIAGNOSIS — IMO0002 Reserved for concepts with insufficient information to code with codable children: Secondary | ICD-10-CM

## 2014-10-27 LAB — BASIC METABOLIC PANEL
BUN: 26 mg/dL — ABNORMAL HIGH (ref 6–23)
CHLORIDE: 111 meq/L (ref 96–112)
CO2: 28 mEq/L (ref 19–32)
CREATININE: 2.02 mg/dL — AB (ref 0.40–1.50)
Calcium: 8.9 mg/dL (ref 8.4–10.5)
GFR: 33.6 mL/min — AB (ref >60.00)
Glucose, Bld: 116 mg/dL — ABNORMAL HIGH (ref 70–99)
Potassium: 4.9 mEq/L (ref 3.5–5.1)
Sodium: 142 mEq/L (ref 135–145)

## 2014-10-27 LAB — HEMOGLOBIN A1C: Hgb A1c MFr Bld: 6.6 % — ABNORMAL HIGH (ref 4.6–6.5)

## 2014-10-29 LAB — FRUCTOSAMINE: Fructosamine: 275 umol/L — ABNORMAL HIGH (ref 190–270)

## 2014-11-01 ENCOUNTER — Ambulatory Visit (INDEPENDENT_AMBULATORY_CARE_PROVIDER_SITE_OTHER): Payer: Medicare Other | Admitting: Endocrinology

## 2014-11-01 ENCOUNTER — Encounter: Payer: Self-pay | Admitting: Endocrinology

## 2014-11-01 ENCOUNTER — Other Ambulatory Visit: Payer: Self-pay | Admitting: *Deleted

## 2014-11-01 VITALS — BP 145/74 | HR 82 | Resp 14 | Ht 69.0 in | Wt 169.4 lb

## 2014-11-01 DIAGNOSIS — E1165 Type 2 diabetes mellitus with hyperglycemia: Secondary | ICD-10-CM | POA: Diagnosis not present

## 2014-11-01 DIAGNOSIS — N184 Chronic kidney disease, stage 4 (severe): Secondary | ICD-10-CM

## 2014-11-01 DIAGNOSIS — E782 Mixed hyperlipidemia: Secondary | ICD-10-CM

## 2014-11-01 DIAGNOSIS — IMO0002 Reserved for concepts with insufficient information to code with codable children: Secondary | ICD-10-CM

## 2014-11-01 DIAGNOSIS — E1122 Type 2 diabetes mellitus with diabetic chronic kidney disease: Secondary | ICD-10-CM | POA: Diagnosis not present

## 2014-11-01 NOTE — Patient Instructions (Signed)
LANTUS 15 units at breakfast with 8 HUMALOG daily REGULARLY regardless of sugar level   Take 6 units at lunch and 8 at supper HUMALOG regardless of sugar level  Please check blood sugars at least half the time about 2 hours after any meal and 3 times per week on waking up.  Please bring blood sugar monitor to each visit. Recommended blood sugar levels about 2 hours after meal is 140-180 and on waking up 90-130
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
## 2014-11-24 DIAGNOSIS — R972 Elevated prostate specific antigen [PSA]: Secondary | ICD-10-CM | POA: Diagnosis not present

## 2014-12-01 DIAGNOSIS — N138 Other obstructive and reflux uropathy: Secondary | ICD-10-CM | POA: Diagnosis not present

## 2014-12-01 DIAGNOSIS — N401 Enlarged prostate with lower urinary tract symptoms: Secondary | ICD-10-CM | POA: Diagnosis not present

## 2014-12-01 DIAGNOSIS — C61 Malignant neoplasm of prostate: Secondary | ICD-10-CM | POA: Diagnosis not present

## 2014-12-20 DIAGNOSIS — N2581 Secondary hyperparathyroidism of renal origin: Secondary | ICD-10-CM | POA: Diagnosis not present

## 2014-12-20 DIAGNOSIS — N184 Chronic kidney disease, stage 4 (severe): Secondary | ICD-10-CM | POA: Diagnosis not present

## 2014-12-20 DIAGNOSIS — N189 Chronic kidney disease, unspecified: Secondary | ICD-10-CM | POA: Diagnosis not present

## 2014-12-27 DIAGNOSIS — N184 Chronic kidney disease, stage 4 (severe): Secondary | ICD-10-CM | POA: Diagnosis not present

## 2014-12-27 DIAGNOSIS — D631 Anemia in chronic kidney disease: Secondary | ICD-10-CM | POA: Diagnosis not present

## 2014-12-27 DIAGNOSIS — N2581 Secondary hyperparathyroidism of renal origin: Secondary | ICD-10-CM | POA: Diagnosis not present

## 2014-12-27 DIAGNOSIS — I129 Hypertensive chronic kidney disease with stage 1 through stage 4 chronic kidney disease, or unspecified chronic kidney disease: Secondary | ICD-10-CM | POA: Diagnosis not present

## 2015-01-17 ENCOUNTER — Other Ambulatory Visit: Payer: Self-pay | Admitting: *Deleted

## 2015-01-17 MED ORDER — FUROSEMIDE 40 MG PO TABS: ORAL_TABLET | ORAL | Status: DC

## 2015-01-17 MED ORDER — METOPROLOL TARTRATE 25 MG PO TABS: ORAL_TABLET | ORAL | Status: DC

## 2015-01-17 MED ORDER — PRAVASTATIN SODIUM 80 MG PO TABS: ORAL_TABLET | ORAL | Status: DC

## 2015-01-17 MED ORDER — ISOSORBIDE MONONITRATE ER 30 MG PO TB24: ORAL_TABLET | ORAL | Status: DC

## 2015-01-17 MED ORDER — CLOPIDOGREL BISULFATE 75 MG PO TABS: 75.0000 mg | ORAL_TABLET | Freq: Every day | ORAL | Status: DC

## 2015-01-25 ENCOUNTER — Other Ambulatory Visit: Payer: Self-pay | Admitting: Endocrinology

## 2015-01-26 ENCOUNTER — Other Ambulatory Visit (INDEPENDENT_AMBULATORY_CARE_PROVIDER_SITE_OTHER): Payer: Medicare Other

## 2015-01-26 DIAGNOSIS — E1165 Type 2 diabetes mellitus with hyperglycemia: Secondary | ICD-10-CM | POA: Diagnosis not present

## 2015-01-26 DIAGNOSIS — IMO0002 Reserved for concepts with insufficient information to code with codable children: Secondary | ICD-10-CM

## 2015-01-26 LAB — COMPREHENSIVE METABOLIC PANEL
ALK PHOS: 42 U/L (ref 39–117)
ALT: 16 U/L (ref 0–53)
AST: 20 U/L (ref 0–37)
Albumin: 3.8 g/dL (ref 3.5–5.2)
BILIRUBIN TOTAL: 0.7 mg/dL (ref 0.2–1.2)
BUN: 36 mg/dL — ABNORMAL HIGH (ref 6–23)
CO2: 30 mEq/L (ref 19–32)
CREATININE: 2.26 mg/dL — AB (ref 0.40–1.50)
Calcium: 9.4 mg/dL (ref 8.4–10.5)
Chloride: 108 mEq/L (ref 96–112)
GFR: 29.5 mL/min — ABNORMAL LOW (ref >60.00)
GLUCOSE: 109 mg/dL — AB (ref 70–99)
POTASSIUM: 4.1 meq/L (ref 3.5–5.1)
SODIUM: 142 meq/L (ref 135–145)
TOTAL PROTEIN: 6.5 g/dL (ref 6.0–8.3)

## 2015-01-26 LAB — HEMOGLOBIN A1C: Hgb A1c MFr Bld: 6.7 % — ABNORMAL HIGH (ref 4.6–6.5)

## 2015-01-31 ENCOUNTER — Ambulatory Visit: Payer: Medicare Other | Admitting: Internal Medicine

## 2015-02-01 ENCOUNTER — Encounter: Payer: Self-pay | Admitting: Endocrinology

## 2015-02-01 ENCOUNTER — Ambulatory Visit (INDEPENDENT_AMBULATORY_CARE_PROVIDER_SITE_OTHER): Payer: Medicare Other | Admitting: Endocrinology

## 2015-02-01 VITALS — BP 112/68 | HR 69 | Temp 98.1°F | Resp 14 | Ht 69.0 in | Wt 179.4 lb

## 2015-02-01 DIAGNOSIS — IMO0002 Reserved for concepts with insufficient information to code with codable children: Secondary | ICD-10-CM

## 2015-02-01 DIAGNOSIS — E1122 Type 2 diabetes mellitus with diabetic chronic kidney disease: Secondary | ICD-10-CM | POA: Diagnosis not present

## 2015-02-01 DIAGNOSIS — E1165 Type 2 diabetes mellitus with hyperglycemia: Secondary | ICD-10-CM

## 2015-02-01 DIAGNOSIS — N184 Chronic kidney disease, stage 4 (severe): Secondary | ICD-10-CM
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
## 2015-02-01 NOTE — Patient Instructions (Addendum)
Lantus 16 units in am daily  Add 2 units more Humalog if having a higher fat meal  Must take humalog just before eating and take pen with you at breakfast  Take extra snack when mowing the lawn  Please check blood sugars at least half the time about 2 hours after any meal and 3 times per week on waking up.   Please bring blood sugar monitor to each visit. Recommended blood sugar levels about 2 hours after meal is 140-180 and on waking up 90-130

## 2015-02-03 ENCOUNTER — Other Ambulatory Visit: Payer: Self-pay | Admitting: Interventional Cardiology

## 2015-02-03 ENCOUNTER — Other Ambulatory Visit: Payer: Self-pay | Admitting: Endocrinology

## 2015-04-18 ENCOUNTER — Other Ambulatory Visit: Payer: Self-pay | Admitting: Interventional Cardiology

## 2015-05-01 ENCOUNTER — Other Ambulatory Visit: Payer: Self-pay | Admitting: Interventional Cardiology

## 2015-05-04 ENCOUNTER — Other Ambulatory Visit (INDEPENDENT_AMBULATORY_CARE_PROVIDER_SITE_OTHER): Payer: Medicare Other

## 2015-05-04 DIAGNOSIS — E1165 Type 2 diabetes mellitus with hyperglycemia: Secondary | ICD-10-CM

## 2015-05-04 DIAGNOSIS — IMO0002 Reserved for concepts with insufficient information to code with codable children: Secondary | ICD-10-CM

## 2015-05-04 LAB — BASIC METABOLIC PANEL
BUN: 42 mg/dL — ABNORMAL HIGH (ref 6–23)
CO2: 27 meq/L (ref 19–32)
Calcium: 9.1 mg/dL (ref 8.4–10.5)
Chloride: 106 mEq/L (ref 96–112)
Creatinine, Ser: 2.33 mg/dL — ABNORMAL HIGH (ref 0.40–1.50)
GFR: 28.46 mL/min — AB (ref >60.00)
Glucose, Bld: 220 mg/dL — ABNORMAL HIGH (ref 70–99)
POTASSIUM: 4.2 meq/L (ref 3.5–5.1)
SODIUM: 140 meq/L (ref 135–145)

## 2015-05-04 LAB — HEMOGLOBIN A1C: HEMOGLOBIN A1C: 6.6 % — AB (ref 4.6–6.5)

## 2015-05-05 ENCOUNTER — Encounter: Payer: Self-pay | Admitting: Endocrinology

## 2015-05-05 ENCOUNTER — Other Ambulatory Visit: Payer: Self-pay | Admitting: *Deleted

## 2015-05-05 ENCOUNTER — Ambulatory Visit (INDEPENDENT_AMBULATORY_CARE_PROVIDER_SITE_OTHER): Payer: Medicare Other | Admitting: Endocrinology

## 2015-05-05 VITALS — BP 120/68 | HR 73 | Temp 97.7°F | Resp 14 | Ht 69.0 in | Wt 180.2 lb

## 2015-05-05 DIAGNOSIS — E1122 Type 2 diabetes mellitus with diabetic chronic kidney disease: Secondary | ICD-10-CM | POA: Diagnosis not present

## 2015-05-05 DIAGNOSIS — N184 Chronic kidney disease, stage 4 (severe): Secondary | ICD-10-CM

## 2015-05-05 DIAGNOSIS — IMO0002 Reserved for concepts with insufficient information to code with codable children: Secondary | ICD-10-CM

## 2015-05-05 DIAGNOSIS — E1165 Type 2 diabetes mellitus with hyperglycemia: Secondary | ICD-10-CM

## 2015-05-05 MED ORDER — GLUCOSE BLOOD VI STRP: ORAL_STRIP | Status: DC
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
## 2015-05-05 NOTE — Patient Instructions (Signed)
Lantus 14 daily  Lunch dose 6 Humalog instead of 8  Check blood sugars on waking up ..3  .. times a week Also check blood sugars about 2 hours after a meal and do this after different meals by rotation  Recommended blood sugar levels on waking up is 90-130 and about 2 hours after meal is 140-180 Please bring blood sugar monitor to each visit.

## 2015-05-08 ENCOUNTER — Inpatient Hospital Stay (HOSPITAL_COMMUNITY)
Admission: EM | Admit: 2015-05-08 | Discharge: 2015-05-16 | DRG: 871 | Disposition: A | Discharge status: Home / Self Care | Payer: Medicare Other | Attending: Internal Medicine | Admitting: Internal Medicine

## 2015-05-08 ENCOUNTER — Encounter (HOSPITAL_COMMUNITY): Payer: Medicare Other

## 2015-05-08 ENCOUNTER — Inpatient Hospital Stay (HOSPITAL_COMMUNITY): Payer: Medicare Other

## 2015-05-08 ENCOUNTER — Emergency Department (HOSPITAL_COMMUNITY): Payer: Medicare Other

## 2015-05-08 ENCOUNTER — Encounter (HOSPITAL_COMMUNITY): Payer: Self-pay | Admitting: Emergency Medicine

## 2015-05-08 DIAGNOSIS — I10 Essential (primary) hypertension: Secondary | ICD-10-CM | POA: Diagnosis not present

## 2015-05-08 DIAGNOSIS — I248 Other forms of acute ischemic heart disease: Secondary | ICD-10-CM | POA: Diagnosis not present

## 2015-05-08 DIAGNOSIS — I4729 Other ventricular tachycardia: Secondary | ICD-10-CM

## 2015-05-08 DIAGNOSIS — R6521 Severe sepsis with septic shock: Secondary | ICD-10-CM | POA: Diagnosis not present

## 2015-05-08 DIAGNOSIS — I209 Angina pectoris, unspecified: Secondary | ICD-10-CM | POA: Diagnosis not present

## 2015-05-08 DIAGNOSIS — E1122 Type 2 diabetes mellitus with diabetic chronic kidney disease: Secondary | ICD-10-CM | POA: Diagnosis present

## 2015-05-08 DIAGNOSIS — Z955 Presence of coronary angioplasty implant and graft: Secondary | ICD-10-CM

## 2015-05-08 DIAGNOSIS — E782 Mixed hyperlipidemia: Secondary | ICD-10-CM | POA: Diagnosis present

## 2015-05-08 DIAGNOSIS — Z87891 Personal history of nicotine dependence: Secondary | ICD-10-CM | POA: Diagnosis not present

## 2015-05-08 DIAGNOSIS — R079 Chest pain, unspecified: Secondary | ICD-10-CM | POA: Diagnosis not present

## 2015-05-08 DIAGNOSIS — N184 Chronic kidney disease, stage 4 (severe): Secondary | ICD-10-CM | POA: Diagnosis present

## 2015-05-08 DIAGNOSIS — E2740 Unspecified adrenocortical insufficiency: Secondary | ICD-10-CM | POA: Diagnosis present

## 2015-05-08 DIAGNOSIS — F419 Anxiety disorder, unspecified: Secondary | ICD-10-CM | POA: Diagnosis present

## 2015-05-08 DIAGNOSIS — Z7902 Long term (current) use of antithrombotics/antiplatelets: Secondary | ICD-10-CM

## 2015-05-08 DIAGNOSIS — I129 Hypertensive chronic kidney disease with stage 1 through stage 4 chronic kidney disease, or unspecified chronic kidney disease: Secondary | ICD-10-CM | POA: Diagnosis present

## 2015-05-08 DIAGNOSIS — R109 Unspecified abdominal pain: Secondary | ICD-10-CM | POA: Insufficient documentation

## 2015-05-08 DIAGNOSIS — Z66 Do not resuscitate: Secondary | ICD-10-CM | POA: Diagnosis present

## 2015-05-08 DIAGNOSIS — I441 Atrioventricular block, second degree: Secondary | ICD-10-CM | POA: Diagnosis not present

## 2015-05-08 DIAGNOSIS — I2510 Atherosclerotic heart disease of native coronary artery without angina pectoris: Secondary | ICD-10-CM | POA: Diagnosis present

## 2015-05-08 DIAGNOSIS — R197 Diarrhea, unspecified: Secondary | ICD-10-CM | POA: Diagnosis not present

## 2015-05-08 DIAGNOSIS — K8020 Calculus of gallbladder without cholecystitis without obstruction: Secondary | ICD-10-CM | POA: Diagnosis not present

## 2015-05-08 DIAGNOSIS — R1011 Right upper quadrant pain: Secondary | ICD-10-CM

## 2015-05-08 DIAGNOSIS — Z794 Long term (current) use of insulin: Secondary | ICD-10-CM

## 2015-05-08 DIAGNOSIS — E1151 Type 2 diabetes mellitus with diabetic peripheral angiopathy without gangrene: Secondary | ICD-10-CM | POA: Diagnosis present

## 2015-05-08 DIAGNOSIS — I472 Ventricular tachycardia: Secondary | ICD-10-CM | POA: Diagnosis not present

## 2015-05-08 DIAGNOSIS — K810 Acute cholecystitis: Secondary | ICD-10-CM | POA: Diagnosis not present

## 2015-05-08 DIAGNOSIS — I2582 Chronic total occlusion of coronary artery: Secondary | ICD-10-CM | POA: Diagnosis present

## 2015-05-08 DIAGNOSIS — I340 Nonrheumatic mitral (valve) insufficiency: Secondary | ICD-10-CM | POA: Diagnosis present

## 2015-05-08 DIAGNOSIS — R1033 Periumbilical pain: Secondary | ICD-10-CM | POA: Diagnosis not present

## 2015-05-08 DIAGNOSIS — D6489 Other specified anemias: Secondary | ICD-10-CM | POA: Diagnosis not present

## 2015-05-08 DIAGNOSIS — Z923 Personal history of irradiation: Secondary | ICD-10-CM

## 2015-05-08 DIAGNOSIS — R001 Bradycardia, unspecified: Secondary | ICD-10-CM | POA: Diagnosis not present

## 2015-05-08 DIAGNOSIS — E119 Type 2 diabetes mellitus without complications: Secondary | ICD-10-CM | POA: Diagnosis not present

## 2015-05-08 DIAGNOSIS — I5022 Chronic systolic (congestive) heart failure: Secondary | ICD-10-CM | POA: Diagnosis present

## 2015-05-08 DIAGNOSIS — K8062 Calculus of gallbladder and bile duct with acute cholecystitis without obstruction: Secondary | ICD-10-CM | POA: Diagnosis present

## 2015-05-08 DIAGNOSIS — R6520 Severe sepsis without septic shock: Secondary | ICD-10-CM | POA: Diagnosis not present

## 2015-05-08 DIAGNOSIS — K819 Cholecystitis, unspecified: Secondary | ICD-10-CM

## 2015-05-08 DIAGNOSIS — R0781 Pleurodynia: Secondary | ICD-10-CM | POA: Diagnosis not present

## 2015-05-08 DIAGNOSIS — R0902 Hypoxemia: Secondary | ICD-10-CM | POA: Diagnosis not present

## 2015-05-08 DIAGNOSIS — N179 Acute kidney failure, unspecified: Secondary | ICD-10-CM | POA: Diagnosis not present

## 2015-05-08 DIAGNOSIS — I25110 Atherosclerotic heart disease of native coronary artery with unstable angina pectoris: Secondary | ICD-10-CM | POA: Diagnosis not present

## 2015-05-08 DIAGNOSIS — N400 Enlarged prostate without lower urinary tract symptoms: Secondary | ICD-10-CM | POA: Diagnosis not present

## 2015-05-08 DIAGNOSIS — I442 Atrioventricular block, complete: Secondary | ICD-10-CM | POA: Diagnosis not present

## 2015-05-08 DIAGNOSIS — I252 Old myocardial infarction: Secondary | ICD-10-CM

## 2015-05-08 DIAGNOSIS — I700 Atherosclerosis of aorta: Secondary | ICD-10-CM | POA: Diagnosis not present

## 2015-05-08 DIAGNOSIS — J9690 Respiratory failure, unspecified, unspecified whether with hypoxia or hypercapnia: Secondary | ICD-10-CM

## 2015-05-08 DIAGNOSIS — Z9049 Acquired absence of other specified parts of digestive tract: Secondary | ICD-10-CM | POA: Diagnosis not present

## 2015-05-08 DIAGNOSIS — R0602 Shortness of breath: Secondary | ICD-10-CM | POA: Diagnosis not present

## 2015-05-08 DIAGNOSIS — I25119 Atherosclerotic heart disease of native coronary artery with unspecified angina pectoris: Secondary | ICD-10-CM | POA: Diagnosis present

## 2015-05-08 DIAGNOSIS — I208 Other forms of angina pectoris: Secondary | ICD-10-CM | POA: Insufficient documentation

## 2015-05-08 DIAGNOSIS — Z452 Encounter for adjustment and management of vascular access device: Secondary | ICD-10-CM | POA: Diagnosis not present

## 2015-05-08 DIAGNOSIS — I5020 Unspecified systolic (congestive) heart failure: Secondary | ICD-10-CM | POA: Diagnosis not present

## 2015-05-08 DIAGNOSIS — N261 Atrophy of kidney (terminal): Secondary | ICD-10-CM | POA: Diagnosis not present

## 2015-05-08 DIAGNOSIS — Z8546 Personal history of malignant neoplasm of prostate: Secondary | ICD-10-CM | POA: Diagnosis not present

## 2015-05-08 DIAGNOSIS — A419 Sepsis, unspecified organism: Principal | ICD-10-CM | POA: Diagnosis present

## 2015-05-08 DIAGNOSIS — R0789 Other chest pain: Secondary | ICD-10-CM | POA: Diagnosis not present

## 2015-05-08 LAB — CBC
HCT: 30.8 % — ABNORMAL LOW (ref 39.0–52.0)
Hemoglobin: 10.3 g/dL — ABNORMAL LOW (ref 13.0–17.0)
MCH: 35.6 pg — ABNORMAL HIGH (ref 26.0–34.0)
MCHC: 33.4 g/dL (ref 30.0–36.0)
MCV: 106.6 fL — AB (ref 78.0–100.0)
Platelets: 309 10*3/uL (ref 150–400)
RBC: 2.89 MIL/uL — AB (ref 4.22–5.81)
RDW: 16.1 % — ABNORMAL HIGH (ref 11.5–15.5)
WBC: 10.5 10*3/uL (ref 4.0–10.5)

## 2015-05-08 LAB — BASIC METABOLIC PANEL
ANION GAP: 11 (ref 5–15)
BUN: 31 mg/dL — AB (ref 6–20)
CALCIUM: 9.3 mg/dL (ref 8.9–10.3)
CO2: 23 mmol/L (ref 22–32)
CREATININE: 2.3 mg/dL — AB (ref 0.61–1.24)
Chloride: 104 mmol/L (ref 101–111)
GFR, EST AFRICAN AMERICAN: 28 mL/min — AB (ref >60)
GFR, EST NON AFRICAN AMERICAN: 24 mL/min — AB (ref >60)
GLUCOSE: 193 mg/dL — AB (ref 65–99)
Potassium: 4.4 mmol/L (ref 3.5–5.1)
SODIUM: 138 mmol/L (ref 135–145)

## 2015-05-08 LAB — I-STAT TROPONIN, ED: Troponin i, poc: 0 ng/mL (ref 0.00–0.08)

## 2015-05-08 LAB — GLUCOSE, CAPILLARY
Glucose-Capillary: 273 mg/dL — ABNORMAL HIGH (ref 65–99)
Glucose-Capillary: 256 mg/dL — ABNORMAL HIGH (ref 65–99)

## 2015-05-08 LAB — CBG MONITORING, ED: Glucose-Capillary: 175 mg/dL — ABNORMAL HIGH (ref 65–99)

## 2015-05-08 LAB — TROPONIN I: Troponin I: <0.03 ng/mL (ref <0.031)

## 2015-05-08 MED ORDER — ACETAMINOPHEN 650 MG RE SUPP
650.0000 mg | Freq: Four times a day (QID) | RECTAL | Status: DC | PRN
  Administered 2015-05-10 (×2): 650 mg via RECTAL
  Filled 2015-05-08 (×2): qty 1

## 2015-05-08 MED ORDER — ONDANSETRON HCL 4 MG/2ML IJ SOLN
4.0000 mg | Freq: Once | INTRAMUSCULAR | Status: AC
  Administered 2015-05-08: 4 mg via INTRAVENOUS
  Filled 2015-05-08: qty 2

## 2015-05-08 MED ORDER — TECHNETIUM TC 99M SESTAMIBI GENERIC - CARDIOLITE
30.0000 | Freq: Once | INTRAVENOUS | Status: AC | PRN
  Administered 2015-05-08: 30 via INTRAVENOUS

## 2015-05-08 MED ORDER — METOPROLOL TARTRATE 12.5 MG HALF TABLET
12.5000 mg | ORAL_TABLET | Freq: Every evening | ORAL | Status: DC
  Administered 2015-05-08: 12.5 mg via ORAL
  Filled 2015-05-08: qty 1

## 2015-05-08 MED ORDER — NITROGLYCERIN 0.4 MG SL SUBL: 0.4000 mg | SUBLINGUAL_TABLET | SUBLINGUAL | Status: DC | PRN

## 2015-05-08 MED ORDER — ASPIRIN 81 MG PO CHEW
324.0000 mg | CHEWABLE_TABLET | Freq: Once | ORAL | Status: AC
  Administered 2015-05-08: 324 mg via ORAL
  Filled 2015-05-08: qty 4

## 2015-05-08 MED ORDER — INSULIN ASPART 100 UNIT/ML ~~LOC~~ SOLN
0.0000 [IU] | Freq: Three times a day (TID) | SUBCUTANEOUS | Status: DC
  Administered 2015-05-08: 5 [IU] via SUBCUTANEOUS
  Administered 2015-05-09: 2 [IU] via SUBCUTANEOUS
  Administered 2015-05-09: 3 [IU] via SUBCUTANEOUS
  Administered 2015-05-10: 2 [IU] via SUBCUTANEOUS
  Administered 2015-05-10 (×2): 1 [IU] via SUBCUTANEOUS

## 2015-05-08 MED ORDER — PRAVASTATIN SODIUM 40 MG PO TABS
80.0000 mg | ORAL_TABLET | Freq: Every day | ORAL | Status: DC
  Administered 2015-05-08 – 2015-05-15 (×7): 80 mg via ORAL
  Filled 2015-05-08 (×5): qty 2
  Filled 2015-05-08 (×2): qty 1
  Filled 2015-05-08: qty 2

## 2015-05-08 MED ORDER — REGADENOSON 0.4 MG/5ML IV SOLN
0.4000 mg | Freq: Once | INTRAVENOUS | Status: AC
  Administered 2015-05-08: 0.4 mg via INTRAVENOUS
  Filled 2015-05-08: qty 5

## 2015-05-08 MED ORDER — ONDANSETRON HCL 4 MG/2ML IJ SOLN
4.0000 mg | Freq: Four times a day (QID) | INTRAMUSCULAR | Status: DC | PRN
  Filled 2015-05-08: qty 2

## 2015-05-08 MED ORDER — ALPRAZOLAM 0.25 MG PO TABS
0.2500 mg | ORAL_TABLET | Freq: Every day | ORAL | Status: DC
  Administered 2015-05-08 – 2015-05-15 (×7): 0.25 mg via ORAL
  Filled 2015-05-08 (×7): qty 1

## 2015-05-08 MED ORDER — INSULIN GLARGINE 100 UNIT/ML SOLOSTAR PEN: 7.0000 [IU] | PEN_INJECTOR | Freq: Every day | SUBCUTANEOUS | Status: DC

## 2015-05-08 MED ORDER — INSULIN GLARGINE 100 UNIT/ML ~~LOC~~ SOLN
7.0000 [IU] | Freq: Every day | SUBCUTANEOUS | Status: DC
  Administered 2015-05-08: 7 [IU] via SUBCUTANEOUS
  Filled 2015-05-08 (×2): qty 0.07

## 2015-05-08 MED ORDER — FENTANYL CITRATE (PF) 100 MCG/2ML IJ SOLN
50.0000 ug | Freq: Once | INTRAMUSCULAR | Status: AC
  Administered 2015-05-08: 50 ug via INTRAVENOUS
  Filled 2015-05-08: qty 2

## 2015-05-08 MED ORDER — CLOPIDOGREL BISULFATE 75 MG PO TABS
75.0000 mg | ORAL_TABLET | Freq: Every day | ORAL | Status: DC
  Administered 2015-05-08 – 2015-05-09 (×2): 75 mg via ORAL
  Filled 2015-05-08 (×3): qty 1

## 2015-05-08 MED ORDER — SODIUM CHLORIDE 0.9 % IJ SOLN
3.0000 mL | Freq: Two times a day (BID) | INTRAMUSCULAR | Status: DC
  Administered 2015-05-08 – 2015-05-10 (×5): 3 mL via INTRAVENOUS

## 2015-05-08 MED ORDER — ISOSORBIDE MONONITRATE ER 30 MG PO TB24
15.0000 mg | ORAL_TABLET | Freq: Every day | ORAL | Status: DC
  Administered 2015-05-08 – 2015-05-09 (×2): 15 mg via ORAL
  Filled 2015-05-08 (×2): qty 1

## 2015-05-08 MED ORDER — ENOXAPARIN SODIUM 30 MG/0.3ML ~~LOC~~ SOLN
30.0000 mg | SUBCUTANEOUS | Status: DC
  Administered 2015-05-08: 30 mg via SUBCUTANEOUS
  Filled 2015-05-08: qty 0.3

## 2015-05-08 MED ORDER — ENOXAPARIN SODIUM 40 MG/0.4ML ~~LOC~~ SOLN: 40.0000 mg | SUBCUTANEOUS | Status: DC

## 2015-05-08 MED ORDER — NITROGLYCERIN 0.4 MG SL SUBL
0.4000 mg | SUBLINGUAL_TABLET | SUBLINGUAL | Status: DC | PRN
  Administered 2015-05-08 – 2015-05-09 (×2): 0.4 mg via SUBLINGUAL
  Filled 2015-05-08: qty 1

## 2015-05-08 MED ORDER — REGADENOSON 0.4 MG/5ML IV SOLN
INTRAVENOUS | Status: AC
  Filled 2015-05-08: qty 5

## 2015-05-08 MED ORDER — TECHNETIUM TC 99M SESTAMIBI GENERIC - CARDIOLITE
10.0000 | Freq: Once | INTRAVENOUS | Status: AC | PRN
  Administered 2015-05-08: 10 via INTRAVENOUS

## 2015-05-08 MED ORDER — ONDANSETRON 4 MG PO TBDP
ORAL_TABLET | ORAL | Status: AC
  Administered 2015-05-08: 4 mg via ORAL
  Filled 2015-05-08: qty 1

## 2015-05-08 MED ORDER — ACETAMINOPHEN 325 MG PO TABS
650.0000 mg | ORAL_TABLET | Freq: Four times a day (QID) | ORAL | Status: DC | PRN
  Administered 2015-05-09 – 2015-05-12 (×3): 650 mg via ORAL
  Filled 2015-05-08 (×4): qty 2

## 2015-05-08 MED ORDER — FUROSEMIDE 20 MG PO TABS
20.0000 mg | ORAL_TABLET | Freq: Every day | ORAL | Status: DC
  Administered 2015-05-08 – 2015-05-09 (×2): 20 mg via ORAL
  Filled 2015-05-08 (×2): qty 1

## 2015-05-08 MED ORDER — ONDANSETRON 4 MG PO TBDP
4.0000 mg | ORAL_TABLET | Freq: Once | ORAL | Status: AC | PRN
  Administered 2015-05-08: 4 mg via ORAL

## 2015-05-08 MED ORDER — ONDANSETRON HCL 4 MG PO TABS
4.0000 mg | ORAL_TABLET | Freq: Four times a day (QID) | ORAL | Status: DC | PRN
  Administered 2015-05-08: 4 mg via ORAL
  Filled 2015-05-08: qty 1
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
## 2015-05-08 NOTE — ED Notes (Signed)
Patient arrives with complaint of mid chest pain and nausea for several days. Reports hx of MI with stent placement. Patient is HOH. Denies fever, dizziness, lightheadedness, and shortness of breath.

## 2015-05-08 NOTE — ED Notes (Signed)
Patient transported to [facility] 

## 2015-05-08 NOTE — ED Provider Notes (Signed)
TIME SEEN: 7:00 AM  CHIEF COMPLAINT: chest pain, shortness of breath, nausea  HPI: Pt is a 81 y.o. male with history of coronary artery disease status post MI who presents to the emergency department with complaints of diffuse chest soreness, shortness of breath and nausea that have been intermittent for the past several days. States feels slightly similar to his prior MI. No fever, cough. No lower extremity swelling or pain. States he was diaphoretic last night. Denies lightheadedness.  Cardiologist is Dr. [name] PCP is Dr. [name]  ROS: See HPI Constitutional: no fever  Eyes: no drainage  ENT: no runny nose   Cardiovascular:   chest pain  Resp: SOB  GI: no vomiting GU: no dysuria Integumentary: no rash  Allergy: no hives  Musculoskeletal: no leg swelling  Neurological: no slurred speech ROS otherwise negative  PAST MEDICAL HISTORY/PAST SURGICAL HISTORY:  Past Medical History  Diagnosis Date  . CAD (coronary artery disease), native coronary artery   . Severe mitral regurgitation   . Type II or unspecified type diabetes mellitus with renal manifestations, not stated as uncontrolled   . PVD (peripheral vascular disease) 39% bilateral carotids  . Angina   . Blood transfusion   . Anemia   . Acute MI inferior posterior subsequent episode care     "have had 2 MI; last one was 12/2010"  . High cholesterol   . Heart murmur   . Prostate cancer     S/P radiation; "40 treatments"  . Diabetes mellitus   . GI bleeding     "they say I'm bleeding from somewhere"  . Anxiety     MEDICATIONS:  Prior to Admission medications   Medication Sig Start Date End Date Taking? Authorizing Provider  ALPRAZolam (XANAX) 0.25 MG tablet Take 0.25 mg by mouth at bedtime as needed.  05/13/13   Historical Provider, MD  BD PEN NEEDLE NANO U/F 32G X 4 MM MISC Use to inject insulin 4  times daily as instructed. 01/25/15   Elayne Snare, MD  beta carotene w/minerals (OCUVITE) tablet Take 1 tablet by  mouth daily.    Historical Provider, MD  clopidogrel (PLAVIX) 75 MG tablet Take 1 tablet by mouth  daily 04/18/15   Jettie Booze, MD  furosemide (LASIX) 40 MG tablet Take one-half tablet by  mouth twice a day 01/17/15   Jettie Booze, MD  glucose blood (ONE TOUCH ULTRA TEST) test strip Use as instructed to check blood sugar 3 times per day dx code Dx code E11.29 05/05/15   Elayne Snare, MD  HUMALOG KWIKPEN 100 UNIT/ML KiwkPen Inject subcutaneously up to 10 units 3 times daily 02/03/15   Elayne Snare, MD  insulin lispro (HUMALOG KWIKPEN) 100 UNIT/ML KiwkPen 0-10 units three times per day Patient taking differently: Inject 6-8 Units into the skin 3 (three) times daily. 0-10 units three times per day 04/22/14   Elayne Snare, MD  isosorbide mononitrate (IMDUR) 30 MG 24 hr tablet Take one-half tablet by  mouth daily 01/17/15   Jettie Booze, MD  LANTUS SOLOSTAR 100 UNIT/ML Solostar Pen Inject subcutaneously 20  units daily 09/10/14   Elayne Snare, MD  metoprolol tartrate (LOPRESSOR) 25 MG tablet Take one-half tablet by  mouth once a day in the  evening 01/17/15   Jettie Booze, MD  Multiple Vitamin (MULITIVITAMIN WITH MINERALS) TABS Take 1 tablet by mouth daily.    Historical Provider, MD  pantoprazole (PROTONIX) 40 MG tablet Take 40 mg by mouth daily.  Historical Provider, MD  pravastatin (PRAVACHOL) 80 MG tablet Take 1 tablet by mouth  daily 05/02/15   Jettie Booze, MD  ranitidine (ZANTAC) 150 MG tablet  12/13/14   Historical Provider, MD    ALLERGIES:  No Known Allergies  SOCIAL HISTORY:  Social History  Substance Use Topics  . Smoking status: Former Smoker    Types: Cigars  . Smokeless tobacco: Never Used     Comment: "quit smoking cigars in my 20-30's"  . Alcohol Use: No    FAMILY HISTORY: Family History  Problem Relation Age of Onset  . Malignant hyperthermia Neg Hx   . Hypertension Mother   . Heart disease Father     EXAM: BP 145/64 mmHg  Pulse 66  Temp(Src)  97.4 F (36.3 C) (Oral)  Resp 18  Ht 5\' 9"  (1.753 m)  Wt 178 lb (80.74 kg)  BMI 26.27 kg/m2  SpO2 100% CONSTITUTIONAL: Alert and oriented and responds appropriately to questions. Elderly, chronically ill-appearing, in no significant distress HEAD: Normocephalic EYES: Conjunctivae clear, PERRL ENT: normal nose; no rhinorrhea; moist mucous membranes; pharynx without lesions noted NECK: Supple, no meningismus, no LAD  CARD: RRR; S1 and S2 appreciated; no murmurs, no clicks, no rubs, no gallops RESP: Normal chest excursion without splinting or tachypnea; breath sounds clear and equal bilaterally; no wheezes, no rhonchi, no rales, no hypoxia or respiratory distress, speaking full sentences ABD/GI: Normal bowel sounds; non-distended; soft, non-tender, no rebound, no guarding, no peritoneal signs BACK:  The back appears normal and is non-tender to palpation, there is no CVA tenderness EXT: Normal ROM in all joints; non-tender to palpation; no edema; normal capillary refill; no cyanosis, no calf tenderness or swelling    SKIN: Normal color for age and race; warm NEURO: Moves all extremities equally, sensation to light touch intact diffusely, cranial nerves II through XII intact PSYCH: The patient's mood and manner are appropriate. Grooming and personal hygiene are appropriate.  MEDICAL DECISION MAKING: Patient here with chest pain, shortness of breath, diaphoresis and nausea. EKG shows no ischemic changes. Troponin negative. He has chronic kidney disease which is unchanged. Chest x-ray clear.  We'll give aspirin, nitroglycerin, Zofran. Will admit for chest pain rule out.  ED PROGRESS: D/w Dr. Carles Collet for admission.  He will see pt in ED and place orders.    EKG Interpretation  Date/Time:  Sunday May 08 2015 03:54:37 EDT Ventricular Rate:  62 PR Interval:  228 QRS Duration: 78 QT Interval:  440 QTC Calculation: 446 R Axis:   52 Text Interpretation:  Sinus rhythm with 1st degree A-V block  Otherwise normal ECG Confirmed by Mukesh Kornegay,  DO, Janelie Goltz [id number]) on 05/08/2015 6:57:53 AM        [place], DO 05/08/15 [id number]

## 2015-05-08 NOTE — ED Notes (Signed)
Attempted report 

## 2015-05-08 NOTE — ED Notes (Signed)
Spoke with cardiology, was advised to wait for return call from them to confirm need for admission or discharge.

## 2015-05-08 NOTE — Progress Notes (Signed)
Reviewed Myoview with Dr Domenic Polite. He suggest we keep overnight and cycle his enzymes. Possible discharge in am if enzymes negative and no further chest pain.  Kerin Ransom PA-C 05/08/2015 2:31 PM
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
## 2015-05-09 ENCOUNTER — Inpatient Hospital Stay (HOSPITAL_COMMUNITY): Payer: Medicare Other

## 2015-05-09 ENCOUNTER — Ambulatory Visit (HOSPITAL_COMMUNITY): Payer: Medicare Other

## 2015-05-09 DIAGNOSIS — I10 Essential (primary) hypertension: Secondary | ICD-10-CM

## 2015-05-09 DIAGNOSIS — I209 Angina pectoris, unspecified: Secondary | ICD-10-CM

## 2015-05-09 DIAGNOSIS — I25110 Atherosclerotic heart disease of native coronary artery with unstable angina pectoris: Secondary | ICD-10-CM

## 2015-05-09 DIAGNOSIS — R001 Bradycardia, unspecified: Secondary | ICD-10-CM | POA: Diagnosis present

## 2015-05-09 DIAGNOSIS — R079 Chest pain, unspecified: Secondary | ICD-10-CM

## 2015-05-09 DIAGNOSIS — R109 Unspecified abdominal pain: Secondary | ICD-10-CM | POA: Insufficient documentation

## 2015-05-09 DIAGNOSIS — I441 Atrioventricular block, second degree: Secondary | ICD-10-CM

## 2015-05-09 LAB — CBC
HCT: 29.4 % — ABNORMAL LOW (ref 39.0–52.0)
HEMOGLOBIN: 10 g/dL — AB (ref 13.0–17.0)
MCH: 35.8 pg — ABNORMAL HIGH (ref 26.0–34.0)
MCHC: 34 g/dL (ref 30.0–36.0)
MCV: 105.4 fL — ABNORMAL HIGH (ref 78.0–100.0)
Platelets: 265 10*3/uL (ref 150–400)
RBC: 2.79 MIL/uL — ABNORMAL LOW (ref 4.22–5.81)
RDW: 15.8 % — ABNORMAL HIGH (ref 11.5–15.5)
WBC: 11 10*3/uL — AB (ref 4.0–10.5)

## 2015-05-09 LAB — GLUCOSE, CAPILLARY
GLUCOSE-CAPILLARY: 180 mg/dL — AB (ref 65–99)
GLUCOSE-CAPILLARY: 268 mg/dL — AB (ref 65–99)
GLUCOSE-CAPILLARY: 148 mg/dL — AB (ref 65–99)
Glucose-Capillary: 218 mg/dL — ABNORMAL HIGH (ref 65–99)

## 2015-05-09 LAB — BASIC METABOLIC PANEL
ANION GAP: 9 (ref 5–15)
BUN: 25 mg/dL — ABNORMAL HIGH (ref 6–20)
CALCIUM: 9 mg/dL (ref 8.9–10.3)
CO2: 24 mmol/L (ref 22–32)
Chloride: 102 mmol/L (ref 101–111)
Creatinine, Ser: 2.05 mg/dL — ABNORMAL HIGH (ref 0.61–1.24)
GFR, EST AFRICAN AMERICAN: 33 mL/min — AB (ref >60)
GFR, EST NON AFRICAN AMERICAN: 28 mL/min — AB (ref >60)
GLUCOSE: 242 mg/dL — AB (ref 65–99)
Potassium: 4.5 mmol/L (ref 3.5–5.1)
SODIUM: 135 mmol/L (ref 135–145)

## 2015-05-09 LAB — SEDIMENTATION RATE: Sed Rate: 43 mm/hr — ABNORMAL HIGH (ref 0–16)

## 2015-05-09 LAB — TROPONIN I

## 2015-05-09 LAB — MAGNESIUM: MAGNESIUM: 2.3 mg/dL (ref 1.7–2.4)

## 2015-05-09 LAB — PROCALCITONIN: PROCALCITONIN: 0.58 ng/mL

## 2015-05-09 LAB — LACTIC ACID, PLASMA: Lactic Acid, Venous: 2.2 mmol/L (ref 0.5–2.0)

## 2015-05-09 LAB — TSH: TSH: 1.202 u[IU]/mL (ref 0.350–4.500)

## 2015-05-09 MED ORDER — ASPIRIN 81 MG PO CHEW: 81.0000 mg | CHEWABLE_TABLET | ORAL | Status: DC

## 2015-05-09 MED ORDER — INSULIN GLARGINE 100 UNIT/ML ~~LOC~~ SOLN
14.0000 [IU] | Freq: Every day | SUBCUTANEOUS | Status: DC
  Administered 2015-05-09 – 2015-05-10 (×2): 14 [IU] via SUBCUTANEOUS
  Filled 2015-05-09 (×2): qty 0.14

## 2015-05-09 MED ORDER — SODIUM CHLORIDE 0.9 % IV SOLN
INTRAVENOUS | Status: DC
  Administered 2015-05-09 – 2015-05-10 (×2): via INTRAVENOUS

## 2015-05-09 MED ORDER — IOHEXOL 300 MG/ML  SOLN
25.0000 mL | INTRAMUSCULAR | Status: AC
  Administered 2015-05-09 (×2): 25 mL via ORAL

## 2015-05-09 MED ORDER — SODIUM CHLORIDE 0.9 % IJ SOLN: 3.0000 mL | Freq: Two times a day (BID) | INTRAMUSCULAR | Status: DC

## 2015-05-09 MED ORDER — SODIUM CHLORIDE 0.9 % IJ SOLN: 3.0000 mL | INTRAMUSCULAR | Status: DC | PRN

## 2015-05-09 MED ORDER — SODIUM CHLORIDE 0.9 % IV SOLN: 250.0000 mL | INTRAVENOUS | Status: DC | PRN

## 2015-05-09 NOTE — Progress Notes (Signed)
Patient: Robert [name] / Admit Date: 05/08/2015 / Date of Encounter: 05/09/2015, 8:18 AM   Subjective: Had a recurrent episode of dyspnea ("felt like I couldn't breathe") felt to correspond around the time that he developed 2:1 then complete heart block for about 2 minutes. Feels fine now. Still can occasionally feel a right chest "soreness" but significantly improved since admission.  Objective: Telemetry: Mostly NSR with PVCs, however, this AM had episode of Wenckebach that went to 2:1 (HR 40s) then complete heart block (HR 30s) with a pause of 3.5 seconds Physical Exam: Blood pressure 118/60, pulse 82, temperature 98.5 F (36.9 C), temperature source Oral, resp. rate 15, height 5\' 10"  (1.778 m), weight 177 lb 3.2 oz (80.377 kg), SpO2 98 %. General: Well developed, well nourished WM in no acute distress Head: Normocephalic, atraumatic, sclera non-icteric, no xanthomas, nares are without discharge. Neck:  JVP not elevated. Lungs: Clear bilaterally to auscultation without wheezes, rales, or rhonchi. Breathing is unlabored. Heart: RRR S1 S2, 2/6 SEM at RUSB. Abdomen: Soft, non-tender, non-distended with normoactive bowel sounds. No rebound/guarding. Extremities: No clubbing or cyanosis. No edema. Distal pedal pulses are 2+ and equal bilaterally. Neuro: Alert and oriented X 3. Moves all extremities spontaneously. Psych:  Responds to questions appropriately with a normal affect.   Intake/Output Summary (Last 24 hours) at 05/09/15 0818 Last data filed at 05/09/15 0500  Gross per 24 hour  Intake      3 ml  Output    300 ml  Net   -297 ml    Inpatient Medications:  . ALPRAZolam  0.25 mg Oral QHS  . clopidogrel  75 mg Oral Daily  . enoxaparin (LOVENOX) injection  30 mg Subcutaneous Q24H  . furosemide  20 mg Oral Daily  . insulin aspart  0-9 Units Subcutaneous TID WC  . insulin glargine  7 Units Subcutaneous QHS  . isosorbide mononitrate  15 mg Oral Daily  . metoprolol tartrate  12.5 mg  Oral QPM  . pravastatin  80 mg Oral q1800  . sodium chloride  3 mL Intravenous Q12H   Infusions:    Labs:  Recent Labs  05/08/15 0419 05/09/15 0138  NA 138 135  K 4.4 4.5  CL 104 102  CO2 23 24  GLUCOSE 193* 242*  BUN 31* 25*  CREATININE 2.30* 2.05*  CALCIUM 9.3 9.0   No results for input(s): AST, ALT, ALKPHOS, BILITOT, PROT, ALBUMIN in the last 72 hours.  Recent Labs  05/08/15 0419 05/09/15 0138  WBC 10.5 11.0*  HGB 10.3* 10.0*  HCT 30.8* 29.4*  MCV 106.6* 105.4*  PLT 309 265    Recent Labs  05/08/15 1700 05/08/15 2009 05/09/15 0138  TROPONINI <0.03 <0.03 <0.03   Invalid input(s): POCBNP No results for input(s): HGBA1C in the last 72 hours.   Radiology/Studies:  Dg Chest 2 View  05/08/2015   CLINICAL DATA:  Acute onset of generalized chest pain, shortness of breath and nausea. Initial encounter.  EXAM: CHEST  2 VIEW  COMPARISON:  Chest radiograph from 01/11/2012  FINDINGS: The lungs are well-aerated and clear. There is no evidence of focal opacification, pleural effusion or pneumothorax.  The heart is normal in size; the mediastinal contour is within normal limits. No acute osseous abnormalities are seen. Scattered clips are noted about the right upper quadrant.  IMPRESSION: No acute cardiopulmonary process seen.   Electronically Signed   By: Garald Balding M.D.   On: 05/08/2015 05:30   Nm Myocar Multi W/spect W/wall Motion /  Ef  05/08/2015   CLINICAL DATA:  Chest pain and history of coronary artery disease with prior PCI and myocardial infarction.  EXAM: MYOCARDIAL IMAGING WITH SPECT (REST AND PHARMACOLOGIC-STRESS)  GATED LEFT VENTRICULAR WALL MOTION STUDY  LEFT VENTRICULAR EJECTION FRACTION  TECHNIQUE: Standard myocardial SPECT imaging was performed after resting intravenous injection of 10 mCi Tc-58m sestamibi. Subsequently, intravenous infusion of Lexiscan was performed under the supervision of the Cardiology staff. At peak effect of the drug, 30 mCi Tc-66m  sestamibi was injected intravenously and standard myocardial SPECT imaging was performed. Quantitative gated imaging was also performed to evaluate left ventricular wall motion, and estimate left ventricular ejection fraction.  COMPARISON:  None.  FINDINGS: Perfusion: There is a large fixed perfusion defect involving the inferior and inferolateral walls consistent with scar. Subtle decreased perfusion of the distal anterior and mid to distal anterolateral wall present. This does not register as a significant quantitative reversible defect by soft for analysis. Subtle inducible ischemia in this region cannot be excluded.  Wall Motion: The left ventricle is moderately dilated. Wall motion analysis demonstrates a nearly akinetic inferior wall with some motion present near the apex. The lateral and septal walls are hypokinetic. The anterior wall shows normal motion.  Left Ventricular Ejection Fraction: 36 %  End diastolic volume [id number] ml  End systolic volume [id number] ml  IMPRESSION: 1. Large fixed scar involving the inferior and inferolateral walls consistent with prior infarction. Possible subtle distal anterior and mid to distal anterolateral wall ischemia. As above, this is not a significant quantitative reversible defect but subjectively does appear mildly diminished on the stress acquisition.  2. Akinetic inferior wall. Lateral and septal hypokinesis. Moderately dilated left ventricular cavity.  3. Left ventricular ejection fraction 36%  4. Intermediate-risk stress test findings*.  *2012 Appropriate Use Criteria for Coronary Revascularization Focused Update: J Am Coll Cardiol. [id number]. http://content.airportbarriers.com.aspx?articleid=1201161   Electronically Signed   By: Aletta Edouard M.D.   On: 05/08/2015 13:37   Dg Abd 2 Views  05/08/2015   CLINICAL DATA:  Intermittent periumbilical abdominal pain, diarrhea, history of prostate cancer  EXAM: ABDOMEN - 2 VIEW  COMPARISON:  None.  FINDINGS:  Nonobstructive bowel gas pattern.  No evidence of free air under the diaphragm on the upright view.  Lower abdominal/ inguinal hernia mesh repair.  Degenerative changes of the lower lumbar spine.  IMPRESSION: No evidence of small bowel obstruction or free air.   Electronically Signed   By: Julian Hy M.D.   On: 05/08/2015 17:00     Assessment and Plan   1. Chest pain/dyspnea - ruled out for MI. Went for nuc yesterday showing inferior scar, cannot exclude subtle inducible ischemia, LVEF 36% with +WMA. Dr. Domenic Polite felt that he could possibly be managed medically if no further CP and enzymes negative. EF 35-40% in 2013 by echo so this is not new. However, he did have an episode of symptomatic bradycardia this AM with Wenckebach -> 2:1 -> CHB for about 2 minutes with HR down to the 30s. He reports feeling SOB for about a minute or two this morning - duration of this event coinciding. Patient refuses pacer pads to be placed as precaution until he speaks with MD. Will keep NPO pending further decisions. D/c low dose lopressor (only on 12.5mg  daily). Check Mg, TSH. 2D echo was ordered earlier this admission and is pending.  2. H/o CAD s/p acute posterior wall MI requiring IABP/DES to mCx DES in 2012 3. Macrocytic anemia, per IM 4. Diabetes  mellitus A1C 6.6. 5. CKD stage III-IV 6. ICM EF 35-40% by echo 2013 with +WMA, mild AS/AI, severe MR, severely increased PASP 28mmHg, small pericardial effusion at that time 7. Known severe mitral regurgitation, has been followed clinically  Signed, Melina Copa PA-C Pager: [phone]

## 2015-05-09 NOTE — Progress Notes (Signed)
CRITICAL VALUE ALERT  Critical value received:  Lactic Acid 2.2  Date of notification:  05/09/2015  Time of notification:  1920  Critical value read back:Yes.    Nurse who received alert:  Ronette Deter  MD notified (1st page):  Lamar Blinks, NP  Time of first page:  1930  MD notified (2nd page):  Time of second page:  Responding MD:  K.Schorr, NP    Time MD responded:  [phone]

## 2015-05-09 NOTE — Progress Notes (Signed)
Called by nursing that patient developed acute shaking chills. Oral temp OK but rectal temp confirms beginnings of a fever at 100 degrees Fahrenheit. Pt reports feeling cold all over, mild increase in SOB. Denies urinary sx. HR in the 120s, sinus tach. Suspect beginnings of infection, possible early sepsis. Nursing has paged internal medicine to initiate workup. EKG shows sinus tach 132bpm with diffuse ST depression most pronounced in leads V3-V6 up to 94mm in V3. Patient denies chest pain, only occasional right sided twinges which have been present since admission. After being covered with blankets he has stopped shaking and now reports feeling "pretty good." SOB resolved. Pulse ox stable. Discussed with Dr. Marlou Porch. EKG changes are likely demand related from elevated HR, and chills/fever are likely more infectious issue than cardiac issue. Would not use BB at present time given recent issues with heart block. Will continue to follow conservatively. Will notify cath lab that we will need to f/u in AM first to review developments of above workup.  Consider adding aspirin if OK with IM. This was given yesterday but not sure if there was a reason it was not continued. CT abdomen pelvis are pending. He remains on Plavix.  Dayna Dunn PA-C

## 2015-05-09 NOTE — Progress Notes (Signed)
Echocardiogram 2D Echocardiogram has been performed.  Tresa Res 05/09/2015, 12:23 PM

## 2015-05-09 NOTE — Progress Notes (Signed)
Pacer pad placed on patient.  Ferdinand Lango, RN

## 2015-05-09 NOTE — Progress Notes (Signed)
[facility]                                                                              Patient Demographics  Robert [name], is a 81 y.o. male, DOB - 04/05/1928, [id number]  Admit date - 05/08/2015   Admitting Physician Orson Eva, MD  Outpatient Primary MD for the patient is Gennette Pac, MD  LOS - 1   Chief Complaint  Patient presents with  . Chest Pain       Brief HPI   81 year old male with a history of STEMI and PCI with DES in May 2012 diabetes mellitus, peripheral vascular disease, prostate cancer, GI bleed, presents with chest discomfort that began on the evening of 05/07/2015. The patient has some associated diaphoresis and nausea. However over the past week, he has noted increasing chest discomfort intermittently and shortness of breath with his usual activities of daily living. The patient states that his been trimming his trees and on the lawn and had some chest discomfort. He is essentially pain free at this point without any shortness of breath. he denies any syncope, fevers, chills, coughing, hemoptysis, diarrhea, hematochezia, melena. The patient also sent described some abdominal pain in the epigastric and periumbilical region that has since resolved since he came to the emergency department. In emergency department, the patient was afebrile and hemodynamically stable. Lab work revealed serum creatinine 2.30. Hemoglobin is 10.3. Initial troponin was negative. Chest x-ray was negative for infiltrates. EKG was sinus rhythm without any ST-T wave changes. Point-of-care troponin was negative.   Assessment & Plan    Principal Problem: Chest pain/angina:  The patient has typical and atypical components of chest discomfort, risk factors diabetes, CKD stage IV, CAD, hyperlipidemia  - Cardiology consulted, EKG did not show acute ST-T wave changes suggestive of ischemia, troponins negative - Stress test showed large fixed eschar involving the inferior  and inferolateral walls, subtle distal anterior and mid to distal anterior lateral wall ischemia, akinetic inferior wall, lateral and septal hypokinesis, EF 36%, intermediate risk stress test finding - Given symptomatic bradycardia with high-grade AV block, cardiology recommending cardiac cath  Symptomatic bradycardia - Heart rate in 30s with high-grade AV block, cardiology discontinued low-dose Lopressor, follow 2-D echo - likely will need EP consult and possible pacemaker, will follow cardiology recommendations  Diabetes mellitus type 2 - Blood sugars uncontrolled, increase Lantus to home dose, continue sliding scale coverage, A1c 6.6 on 8/10  CKD stage IV  -Baseline creatinine 2.0-2.4  -Continue to monitor , currently at baseline  Coronary artery disease with history of STEMI -Continue aspirin, statin, Imdur, Plavix - Beta blocker discontinued due to bradycardia  History of GI bleed  -Baseline hemoglobin 10-11, currently at baseline  Hyperlipidemia -Continue pravastatin   Abdominal pain resolved -2 view abd negative for any acute pathology or obstruction  Code Status: DO NOT RESUSCITATE  Family Communication: Discussed in detail with the patient, all imaging results, lab results explained to the patient    Disposition Plan: Note medically ready   Time Spent in minutes  25 minutes  Procedures  Abdominal x-ray  Consults   Cardiology  DVT Prophylaxis Lovenox  Medications  Scheduled Meds: . ALPRAZolam  0.25 mg Oral QHS  . clopidogrel  75 mg Oral Daily  . enoxaparin (LOVENOX) injection  30 mg Subcutaneous Q24H  . furosemide  20 mg Oral Daily  . insulin aspart  0-9 Units Subcutaneous TID WC  . insulin glargine  7 Units Subcutaneous QHS  . isosorbide mononitrate  15 mg Oral Daily  . pravastatin  80 mg Oral q1800  . sodium chloride  3 mL Intravenous Q12H   Continuous Infusions:  PRN Meds:.acetaminophen **OR** acetaminophen, nitroGLYCERIN, ondansetron **OR**  ondansetron (ZOFRAN) IV   Antibiotics   Anti-infectives    None        Subjective:   Robert [name] was seen and examined today. Patient denies dizziness, chest pain, abdominal pain, N/V/D/C, new weakness, numbess, tingling. Had episode of shortness of breath earlier this morning, found to have bradycardia with heart block  Objective:   Blood pressure 118/60, pulse 82, temperature 98.5 F (36.9 C), temperature source Oral, resp. rate 15, height 5\' 10"  (1.778 m), weight 80.377 kg (177 lb 3.2 oz), SpO2 98 %.  Wt Readings from Last 3 Encounters:  05/09/15 80.377 kg (177 lb 3.2 oz)  05/05/15 81.738 kg (180 lb 3.2 oz)  02/01/15 81.375 kg (179 lb 6.4 oz)     Intake/Output Summary (Last 24 hours) at 05/09/15 1209 Last data filed at 05/09/15 0500  Gross per 24 hour  Intake      3 ml  Output    300 ml  Net   -297 ml    Exam  General: Alert and oriented x 3, NAD  HEENT:  PERRLA, EOMI, Anicteric Sclera, mucous membranes moist.   Neck: Supple, no JVD, no masses  CVS: S1 S2 auscultated, 2/6SEM at RUSB  Respiratory: Clear to auscultation bilaterally, no wheezing, rales or rhonchi  Abdomen: Soft, nontender, nondistended, + bowel sounds  Ext: no cyanosis clubbing or edema  Neuro: AAOx3, Cr N's II- XII. Strength 5/5 upper and lower extremities bilaterally  Skin: No rashes  Psych: Normal affect and demeanor, alert and oriented x3    Data Review   Micro Results No results found for this or any previous visit (from the past 240 hour(s)).  Radiology Reports Dg Chest 2 View  05/08/2015   CLINICAL DATA:  Acute onset of generalized chest pain, shortness of breath and nausea. Initial encounter.  EXAM: CHEST  2 VIEW  COMPARISON:  Chest radiograph from 01/11/2012  FINDINGS: The lungs are well-aerated and clear. There is no evidence of focal opacification, pleural effusion or pneumothorax.  The heart is normal in size; the mediastinal contour is within normal limits. No acute  osseous abnormalities are seen. Scattered clips are noted about the right upper quadrant.  IMPRESSION: No acute cardiopulmonary process seen.   Electronically Signed   By: Garald Balding M.D.   On: 05/08/2015 05:30   Nm Myocar Multi W/spect W/wall Motion / Ef  05/08/2015   CLINICAL DATA:  Chest pain and history of coronary artery disease with prior PCI and myocardial infarction.  EXAM: MYOCARDIAL IMAGING WITH SPECT (REST AND PHARMACOLOGIC-STRESS)  GATED LEFT VENTRICULAR WALL MOTION STUDY  LEFT VENTRICULAR EJECTION FRACTION  TECHNIQUE: Standard myocardial SPECT imaging was performed after resting intravenous injection of 10 mCi Tc-39m sestamibi. Subsequently, intravenous infusion of Lexiscan was performed under the supervision of the Cardiology staff. At peak effect of the drug, 30 mCi Tc-44m sestamibi was injected intravenously and standard myocardial SPECT imaging was performed. Quantitative gated imaging was also performed  to evaluate left ventricular wall motion, and estimate left ventricular ejection fraction.  COMPARISON:  None.  FINDINGS: Perfusion: There is a large fixed perfusion defect involving the inferior and inferolateral walls consistent with scar. Subtle decreased perfusion of the distal anterior and mid to distal anterolateral wall present. This does not register as a significant quantitative reversible defect by soft for analysis. Subtle inducible ischemia in this region cannot be excluded.  Wall Motion: The left ventricle is moderately dilated. Wall motion analysis demonstrates a nearly akinetic inferior wall with some motion present near the apex. The lateral and septal walls are hypokinetic. The anterior wall shows normal motion.  Left Ventricular Ejection Fraction: 36 %  End diastolic volume [id number] ml  End systolic volume [id number] ml  IMPRESSION: 1. Large fixed scar involving the inferior and inferolateral walls consistent with prior infarction. Possible subtle distal anterior and mid to distal  anterolateral wall ischemia. As above, this is not a significant quantitative reversible defect but subjectively does appear mildly diminished on the stress acquisition.  2. Akinetic inferior wall. Lateral and septal hypokinesis. Moderately dilated left ventricular cavity.  3. Left ventricular ejection fraction 36%  4. Intermediate-risk stress test findings*.  *2012 Appropriate Use Criteria for Coronary Revascularization Focused Update: J Am Coll Cardiol. [id number]. http://content.[email or web address].aspx?articleid=[email or web address]   Electronically Signed   By: Aletta Edouard M.D.   On: 05/08/2015 13:37   Dg Abd 2 Views  05/08/2015   CLINICAL DATA:  Intermittent periumbilical abdominal pain, diarrhea, history of prostate cancer  EXAM: ABDOMEN - 2 VIEW  COMPARISON:  None.  FINDINGS: Nonobstructive bowel gas pattern.  No evidence of free air under the diaphragm on the upright view.  Lower abdominal/ inguinal hernia mesh repair.  Degenerative changes of the lower lumbar spine.  IMPRESSION: No evidence of small bowel obstruction or free air.   Electronically Signed   By: Julian Hy M.D.   On: 05/08/2015 17:00    CBC  Recent Labs Lab 05/08/15 0419 05/09/15 0138  WBC 10.5 11.0*  HGB 10.3* 10.0*  HCT 30.8* 29.4*  PLT 309 265  MCV 106.6* 105.4*  MCH 35.6* 35.8*  MCHC 33.4 34.0  RDW 16.1* 15.8*    Chemistries   Recent Labs Lab 05/04/15 1034 05/08/15 0419 05/09/15 0138  NA 140 138 135  K 4.2 4.4 4.5  CL 106 104 102  CO2 27 23 24   GLUCOSE 220* 193* 242*  BUN 42* 31* 25*  CREATININE 2.33* 2.30* 2.05*  CALCIUM 9.1 9.3 9.0   ------------------------------------------------------------------------------------------------------------------ estimated creatinine clearance is 27.7 mL/min (by C-G formula based on Cr of 2.05). ------------------------------------------------------------------------------------------------------------------ No results for input(s): HGBA1C in the last 72  hours. ------------------------------------------------------------------------------------------------------------------ No results for input(s): CHOL, HDL, LDLCALC, TRIG, CHOLHDL, LDLDIRECT in the last 72 hours. ------------------------------------------------------------------------------------------------------------------ No results for input(s): TSH, T4TOTAL, T3FREE, THYROIDAB in the last 72 hours.  Invalid input(s): FREET3 ------------------------------------------------------------------------------------------------------------------ No results for input(s): VITAMINB12, FOLATE, FERRITIN, TIBC, IRON, RETICCTPCT in the last 72 hours.  Coagulation profile No results for input(s): INR, PROTIME in the last 168 hours.  No results for input(s): DDIMER in the last 72 hours.  Cardiac Enzymes  Recent Labs Lab 05/08/15 1700 05/08/15 2009 05/09/15 0138  TROPONINI <0.03 <0.03 <0.03   ------------------------------------------------------------------------------------------------------------------ Invalid input(s): POCBNP   Recent Labs  05/08/15 1005 05/08/15 1633 05/08/15 2129 05/09/15 0727  GLUCAP 175* 273* 256* 180*     RAI,RIPUDEEP M.D. [facility] 05/09/2015, 12:09 PM  Pager: [phone] Between 7am to 7pm - call Pager - [phone]  After  7pm go to [email or web address] - password [email or web address]  Call night coverage person covering after 7pm

## 2015-05-09 NOTE — Progress Notes (Signed)
Pt refused to have pacer/ defib pad put on his chest, he keeps refusing after been educated about the need and risk. He said he wants to talk with Dr. Tamala Julian first.  Robert Lango, RN

## 2015-05-10 ENCOUNTER — Encounter (HOSPITAL_COMMUNITY)
Admission: EM | Disposition: A | Discharge status: Home / Self Care | Payer: Self-pay | Source: Home / Self Care | Attending: Pulmonary Disease

## 2015-05-10 ENCOUNTER — Inpatient Hospital Stay (HOSPITAL_COMMUNITY): Payer: Medicare Other

## 2015-05-10 DIAGNOSIS — R0902 Hypoxemia: Secondary | ICD-10-CM

## 2015-05-10 DIAGNOSIS — K819 Cholecystitis, unspecified: Secondary | ICD-10-CM

## 2015-05-10 DIAGNOSIS — R6521 Severe sepsis with septic shock: Secondary | ICD-10-CM

## 2015-05-10 DIAGNOSIS — A419 Sepsis, unspecified organism: Principal | ICD-10-CM

## 2015-05-10 DIAGNOSIS — R6520 Severe sepsis without septic shock: Secondary | ICD-10-CM

## 2015-05-10 DIAGNOSIS — R1033 Periumbilical pain: Secondary | ICD-10-CM

## 2015-05-10 DIAGNOSIS — R001 Bradycardia, unspecified: Secondary | ICD-10-CM

## 2015-05-10 DIAGNOSIS — I5020 Unspecified systolic (congestive) heart failure: Secondary | ICD-10-CM

## 2015-05-10 LAB — LACTIC ACID, PLASMA
LACTIC ACID, VENOUS: 1.5 mmol/L (ref 0.5–2.0)
Lactic Acid, Venous: 1.6 mmol/L (ref 0.5–2.0)

## 2015-05-10 LAB — COMPREHENSIVE METABOLIC PANEL
ALT: 18 U/L (ref 17–63)
AST: 30 U/L (ref 15–41)
Albumin: 2.7 g/dL — ABNORMAL LOW (ref 3.5–5.0)
Alkaline Phosphatase: 39 U/L (ref 38–126)
Anion gap: 8 (ref 5–15)
BUN: 25 mg/dL — ABNORMAL HIGH (ref 6–20)
CO2: 24 mmol/L (ref 22–32)
Calcium: 7.9 mg/dL — ABNORMAL LOW (ref 8.9–10.3)
Chloride: 102 mmol/L (ref 101–111)
Creatinine, Ser: 2.38 mg/dL — ABNORMAL HIGH (ref 0.61–1.24)
GFR calc Af Amer: 27 mL/min — ABNORMAL LOW (ref >60)
GFR calc non Af Amer: 23 mL/min — ABNORMAL LOW (ref >60)
Glucose, Bld: 164 mg/dL — ABNORMAL HIGH (ref 65–99)
Potassium: 3.8 mmol/L (ref 3.5–5.1)
Sodium: 134 mmol/L — ABNORMAL LOW (ref 135–145)
Total Bilirubin: 2 mg/dL — ABNORMAL HIGH (ref 0.3–1.2)
Total Protein: 5.2 g/dL — ABNORMAL LOW (ref 6.5–8.1)

## 2015-05-10 LAB — HEPARIN LEVEL (UNFRACTIONATED)
Heparin Unfractionated: 0.53 IU/mL (ref 0.30–0.70)
Heparin Unfractionated: 0.49 IU/mL (ref 0.30–0.70)

## 2015-05-10 LAB — CBC
HCT: 27 % — ABNORMAL LOW (ref 39.0–52.0)
Hemoglobin: 9.1 g/dL — ABNORMAL LOW (ref 13.0–17.0)
MCH: 36.3 pg — ABNORMAL HIGH (ref 26.0–34.0)
MCHC: 33.7 g/dL (ref 30.0–36.0)
MCV: 107.6 fL — ABNORMAL HIGH (ref 78.0–100.0)
Platelets: 198 10*3/uL (ref 150–400)
RBC: 2.51 MIL/uL — ABNORMAL LOW (ref 4.22–5.81)
RDW: 16 % — ABNORMAL HIGH (ref 11.5–15.5)
WBC: 23.5 10*3/uL — ABNORMAL HIGH (ref 4.0–10.5)

## 2015-05-10 LAB — GLUCOSE, CAPILLARY
Glucose-Capillary: 138 mg/dL — ABNORMAL HIGH (ref 65–99)
Glucose-Capillary: 144 mg/dL — ABNORMAL HIGH (ref 65–99)
Glucose-Capillary: 171 mg/dL — ABNORMAL HIGH (ref 65–99)
Glucose-Capillary: 188 mg/dL — ABNORMAL HIGH (ref 65–99)

## 2015-05-10 LAB — PROTIME-INR
INR: 1.32 (ref 0.00–1.49)
Prothrombin Time: 16.5 seconds — ABNORMAL HIGH (ref 11.6–15.2)

## 2015-05-10 LAB — TROPONIN I
TROPONIN I: 1.42 ng/mL — AB (ref <0.031)
TROPONIN I: 0.8 ng/mL — AB (ref <0.031)

## 2015-05-10 SURGERY — LEFT HEART CATH AND CORONARY ANGIOGRAPHY

## 2015-05-10 MED ORDER — HEPARIN (PORCINE) IN NACL 100-0.45 UNIT/ML-% IJ SOLN
INTRAMUSCULAR | Status: AC
  Administered 2015-05-10: 1000 [IU]/h via INTRAVENOUS
  Filled 2015-05-10: qty 250

## 2015-05-10 MED ORDER — MORPHINE SULFATE (PF) 4 MG/ML IV SOLN
INTRAVENOUS | Status: AC
  Administered 2015-05-10: 3.4 mg via INTRAVENOUS
  Filled 2015-05-10: qty 1

## 2015-05-10 MED ORDER — PIPERACILLIN-TAZOBACTAM 3.375 G IVPB
3.3750 g | Freq: Three times a day (TID) | INTRAVENOUS | Status: DC
  Administered 2015-05-10 – 2015-05-16 (×18): 3.375 g via INTRAVENOUS
  Filled 2015-05-10 (×22): qty 50

## 2015-05-10 MED ORDER — LORAZEPAM 2 MG/ML IJ SOLN
0.5000 mg | Freq: Once | INTRAMUSCULAR | Status: AC
  Administered 2015-05-10: 0.5 mg via INTRAVENOUS
  Filled 2015-05-10: qty 1

## 2015-05-10 MED ORDER — HEPARIN (PORCINE) IN NACL 100-0.45 UNIT/ML-% IJ SOLN
1000.0000 [IU]/h | INTRAMUSCULAR | Status: DC
  Administered 2015-05-10 – 2015-05-11 (×2): 1000 [IU]/h via INTRAVENOUS
  Filled 2015-05-10: qty 250

## 2015-05-10 MED ORDER — SODIUM CHLORIDE 0.9 % IV BOLUS (SEPSIS)
500.0000 mL | Freq: Once | INTRAVENOUS | Status: AC
  Administered 2015-05-10: 500 mL via INTRAVENOUS

## 2015-05-10 MED ORDER — SINCALIDE 5 MCG IJ SOLR
0.0200 ug/kg | Freq: Once | INTRAMUSCULAR | Status: DC
  Filled 2015-05-10: qty 5

## 2015-05-10 MED ORDER — HEPARIN BOLUS VIA INFUSION
4000.0000 [IU] | Freq: Once | INTRAVENOUS | Status: AC
  Administered 2015-05-10: 4000 [IU] via INTRAVENOUS
  Filled 2015-05-10: qty 4000

## 2015-05-10 MED ORDER — SODIUM CHLORIDE 0.9 % IV SOLN
INTRAVENOUS | Status: DC
  Administered 2015-05-10 – 2015-05-12 (×5): via INTRAVENOUS

## 2015-05-10 MED ORDER — VANCOMYCIN HCL IN DEXTROSE 1-5 GM/200ML-% IV SOLN
1000.0000 mg | INTRAVENOUS | Status: DC
  Administered 2015-05-11 – 2015-05-13 (×3): 1000 mg via INTRAVENOUS
  Filled 2015-05-10 (×4): qty 200

## 2015-05-10 MED ORDER — SODIUM CHLORIDE 0.9 % IV BOLUS (SEPSIS)
500.0000 mL | Freq: Once | INTRAVENOUS | Status: AC
Start: 2015-05-10 — End: 2015-05-11
  Administered 2015-05-10: 500 mL via INTRAVENOUS

## 2015-05-10 MED ORDER — SODIUM CHLORIDE 0.9 % IV BOLUS (SEPSIS)
250.0000 mL | Freq: Once | INTRAVENOUS | Status: AC
  Administered 2015-05-10: 250 mL via INTRAVENOUS

## 2015-05-10 MED ORDER — TECHNETIUM TC 99M MEBROFENIN IV KIT
5.0000 | PACK | Freq: Once | INTRAVENOUS | Status: DC | PRN
  Administered 2015-05-10: 5 via INTRAVENOUS
  Filled 2015-05-10: qty 6

## 2015-05-10 MED ORDER — IBUPROFEN 200 MG PO TABS
400.0000 mg | ORAL_TABLET | Freq: Once | ORAL | Status: AC
  Administered 2015-05-10: 400 mg via ORAL
  Filled 2015-05-10: qty 2

## 2015-05-10 MED ORDER — SINCALIDE 5 MCG IJ SOLR
INTRAMUSCULAR | Status: AC
  Filled 2015-05-10: qty 5

## 2015-05-10 MED ORDER — VANCOMYCIN HCL 10 G IV SOLR
1250.0000 mg | Freq: Once | INTRAVENOUS | Status: AC
  Administered 2015-05-10: 1250 mg via INTRAVENOUS
  Filled 2015-05-10: qty 1250

## 2015-05-10 MED ORDER — MORPHINE SULFATE (PF) 4 MG/ML IV SOLN
3.4000 mg | Freq: Once | INTRAVENOUS | Status: AC
  Administered 2015-05-10: 3.4 mg via INTRAVENOUS

## 2015-05-10 MED ORDER — PIPERACILLIN-TAZOBACTAM 3.375 G IVPB 30 MIN
3.3750 g | INTRAVENOUS | Status: AC
  Administered 2015-05-10: 3.375 g via INTRAVENOUS
  Filled 2015-05-10: qty 50

## 2015-05-10 MED ORDER — ACETAMINOPHEN 325 MG PO TABS
325.0000 mg | ORAL_TABLET | Freq: Once | ORAL | Status: AC
  Administered 2015-05-10: 325 mg via ORAL

## 2015-05-10 MED ORDER — SODIUM CHLORIDE 0.9 % IV BOLUS (SEPSIS)
1000.0000 mL | Freq: Once | INTRAVENOUS | Status: AC
  Administered 2015-05-10: 1000 mL via INTRAVENOUS

## 2015-05-10 NOTE — Progress Notes (Signed)
NP Schorr at bedside, notified of CT results.

## 2015-05-10 NOTE — Progress Notes (Addendum)
ANTIBIOTIC/ANTICOAGULATION CONSULT NOTE - INITIAL  Pharmacy Consult for Zosyn and Heparin Indication: cholecystitis and r/o ACS  No Known Allergies  Patient Measurements: Height: 5\' 10"  (177.8 cm) Weight: 177 lb 3.2 oz (80.377 kg) IBW/kg (Calculated) : 73  Heparin dosing wt: 80 kg  Vital Signs: Temp: 99.7 F (37.6 C) (08/16 0208) Temp Source: Oral (08/16 0208) BP: 99/45 mmHg (08/16 0208) Pulse Rate: 112 (08/16 0208) Intake/Output from previous day: 08/15 0701 - 08/16 0700 In: 960 [P.O.:960] Out: 325 [Urine:325] Intake/Output from this shift: Total I/O In: 960 [P.O.:960] Out: 325 [Urine:325]  Labs:  Recent Labs  05/08/15 0419 05/09/15 0138  WBC 10.5 11.0*  HGB 10.3* 10.0*  PLT 309 265  CREATININE 2.30* 2.05*   Estimated Creatinine Clearance: 27.7 mL/min (by C-G formula based on Cr of 2.05). No results for input(s): VANCOTROUGH, VANCOPEAK, VANCORANDOM, GENTTROUGH, GENTPEAK, GENTRANDOM, TOBRATROUGH, TOBRAPEAK, TOBRARND, AMIKACINPEAK, AMIKACINTROU, AMIKACIN in the last 72 hours.   Microbiology: No results found for this or any previous visit (from the past 720 hour(s)).  Medical History: Past Medical History  Diagnosis Date  . CAD (coronary artery disease), native coronary artery   . Severe mitral regurgitation   . Type II or unspecified type diabetes mellitus with renal manifestations, not stated as uncontrolled   . PVD (peripheral vascular disease) 39% bilateral carotids  . Angina   . Blood transfusion   . Anemia   . Acute MI inferior posterior subsequent episode care     "have had 2 MI; last one was 12/2010"  . High cholesterol   . Heart murmur   . Prostate cancer     S/P radiation; "40 treatments"  . Diabetes mellitus   . GI bleeding     "they say I'm bleeding from somewhere"  . Anxiety     Medications:  Prescriptions prior to admission  Medication Sig Dispense Refill Last Dose  . ALPRAZolam (XANAX) 0.25 MG tablet Take 0.25 mg by mouth at bedtime.     05/07/2015 at Unknown time  . clopidogrel (PLAVIX) 75 MG tablet Take 1 tablet by mouth  daily 30 tablet 0 05/07/2015 at Unknown time  . furosemide (LASIX) 40 MG tablet Take one-half tablet by  mouth twice a day 90 tablet 0 05/07/2015 at Unknown time  . insulin lispro (HUMALOG KWIKPEN) 100 UNIT/ML KiwkPen 0-10 units three times per day (Patient taking differently: Inject 7 Units into the skin 3 (three) times daily. 0-10 units three times per day) 5 pen 1 05/07/2015 at Unknown time  . isosorbide mononitrate (IMDUR) 30 MG 24 hr tablet Take one-half tablet by  mouth daily 45 tablet 0 05/07/2015 at Unknown time  . LANTUS SOLOSTAR 100 UNIT/ML Solostar Pen Inject subcutaneously 20  units daily (Patient taking differently: Inject subcutaneously 14  units daily) 30 mL 1 05/07/2015 at Unknown time  . metoprolol tartrate (LOPRESSOR) 25 MG tablet Take one-half tablet by  mouth once a day in the  evening (Patient taking differently: Take 12.5 mg by mouth every evening. Take one-half tablet by  mouth once a day in the  evening) 45 tablet 0 05/07/2015 at 1700  . pravastatin (PRAVACHOL) 80 MG tablet Take 1 tablet by mouth  daily 90 tablet 0 05/07/2015 at Unknown time  . BD PEN NEEDLE NANO U/F 32G X 4 MM MISC Use to inject insulin 4  times daily as instructed. 400 each 3 unknown  . glucose blood (ONE TOUCH ULTRA TEST) test strip Use as instructed to check blood sugar 3 times  per day dx code Dx code E11.29 100 each 3 unknown   Assessment: 81 y.o. male presented on 8/14 with CP. 8/16 developed fever of 103 and chills. To begin Zosyn for cholecystitis. SCr 2.05, est CrCl 28 ml/min. WBC elevated to 11. LA 2.2.  Goal of Therapy:  Resolution of infection  Plan:  Zosyn 3.375gm IV now over 30 min then 3.375gm IV q8h - subsequent doses over 4 hours Will f/u micro data, renal function, and pt's clinical condition  Sherlon Handing, PharmD, BCPS Clinical pharmacist, pager [phone] 05/10/2015,2:35 AM    Addendum: Troponin up to  1.42. Order to start heparin per pharmacy for r/o ACS. Hgb 9.1 - watch closely. Last Lovenox 40mg  given 8/14 ~1500.  Plan: Heparin 4000 units IV bolus Heparin gtt at 1000 units/hr Will f/u 8 hr heparin level Daily heparin level and CBC  Sherlon Handing, PharmD, BCPS Clinical pharmacist, pager [phone] 05/10/2015 6:06 AM

## 2015-05-10 NOTE — Progress Notes (Signed)
Patient ID: Robert [name], male   DOB: 1928-04-26, 81 y.o.   MRN: [id number]  Hemodynamically stable. Having fevers and is tender in the RUQ  Awaiting HIDA scan.  If positive, recommend perc chole tube

## 2015-05-10 NOTE — Progress Notes (Addendum)
[facility]                                                                              Patient Demographics  Robert [name], is a 81 y.o. male, DOB - [birth year], [id number]  Admit date - 05/08/2015   Admitting Physician Robert Eva, MD  Outpatient Primary MD for the patient is Robert Pac, MD  LOS - 2   Chief Complaint  Patient presents with  . Chest Pain       Brief HPI   81 year old male with a history of STEMI and PCI with DES in May 2012 diabetes mellitus, peripheral vascular disease, prostate cancer, GI bleed, presents with chest discomfort that began on the evening of 05/07/2015. The patient has some associated diaphoresis and nausea. However over the past week, he has noted increasing chest discomfort intermittently and shortness of breath with his usual activities of daily living. The patient states that his been trimming his trees and on the lawn and had some chest discomfort. He is essentially pain free at this point without any shortness of breath. he denies any syncope, fevers, chills, coughing, hemoptysis, diarrhea, hematochezia, melena. The patient also sent described some abdominal pain in the epigastric and periumbilical region that has since resolved since he came to the emergency department. In emergency department, the patient was afebrile and hemodynamically stable. Lab work revealed serum creatinine 2.30. Hemoglobin is 10.3. Initial troponin was negative. Chest x-ray was negative for infiltrates. EKG was sinus rhythm without any ST-T wave changes. Point-of-care troponin was negative.   Assessment & Plan    Principal Problem: Sepsis with hypotension, acute cholecystitis - CT abdomen showed mild soft tissue stranding about the gallbladder, mild acute cholecystitis. -Blood cultures in process, Overnight events noted, significant lower BP readings, given patient 1 L bolus and currently IV fluids on 125 mL an hour. BP has improved somewhat  however still febrile. BP currently 104/70. Since 2 AM, patient had received 2 L including boluses. - Will broaden antibiotics coverage and add vancomycin as well, placed on Zosyn overnight. - Patient examined 3 times today, alert and oriented 3, son at the bedside, CODE STATUS addressed, full code. Discussed with Dr Rogene Houston (CCM), will evaluate patient, will need to transfer to ICU.  - Discussed with general surgery, HIDA scan today, continue NPO, IV fluids, antibiotics - Hold all antihypertensives, patient was on Lasix and Imdur Addendum: 2:30pm Blood cultures positive for gram-positive cocci, placed on vancomycin earlier this morning  Chest pain/angina:  The patient has typical and atypical components of chest discomfort, risk factors diabetes, CKD stage IV, CAD, hyperlipidemia  - Cardiology consulted, EKG did not show acute ST-T wave changes suggestive of ischemia, troponins positive - Stress test showed large fixed eschar involving the inferior and inferolateral walls, subtle distal anterior and mid to distal anterior lateral wall ischemia, akinetic inferior wall, lateral and septal hypokinesis, EF 36%, intermediate risk stress test finding - Given symptomatic bradycardia with high-grade AV block, cardiac cath was planned however canceled due to #1 - 2-D echo showed EF of 40% with basal/mid inferior and basal/mid inferolateral akinetic segments  Symptomatic bradycardia - Heart rate  in 67s with high-grade AV block,cardiology had discontinued Lopressor   Diabetes mellitus type 2 - Blood sugars uncontrolled, increase Lantus to home dose, continue sliding scale coverage, A1c 6.6 on 8/10  CKD stage IV  -Baseline creatinine 2.0-2.4  - creatinine trended up to 2.3 today, placed on aggressive IV fluid hydration  Coronary artery disease with history of STEMI -Continue aspirin, statin,  - Hold Lasix, Imdur - Beta blocker discontinued due to bradycardia  History of GI bleed  -Baseline  hemoglobin 10-11 - Follow CBC closely  Hyperlipidemia -Continue pravastatin   Code Status:Full CODE STATUS, re-adressed today again   Family Communication: Discussed in detail with the patient, all imaging results, lab results explained to the patient And patient's son at the bedside   Disposition Plan:Low threshold for transferring to ICU  Time Spent in minutes 40 minutes  Procedures  Abdominal x-ray  Consults   Cardiology  DVT Prophylaxis Lovenox  Medications  Scheduled Meds: . ALPRAZolam  0.25 mg Oral QHS  . clopidogrel  75 mg Oral Daily  . insulin aspart  0-9 Units Subcutaneous TID WC  . insulin glargine  14 Units Subcutaneous QHS  . piperacillin-tazobactam (ZOSYN)  IV  3.375 g Intravenous 3 times per day  . pravastatin  80 mg Oral q1800  . sodium chloride  3 mL Intravenous Q12H   Continuous Infusions: . sodium chloride 125 mL/hr at 05/10/15 0936  . heparin 1,000 Units/hr (05/10/15 0617)   PRN Meds:.acetaminophen **OR** acetaminophen, nitroGLYCERIN, ondansetron **OR** ondansetron (ZOFRAN) IV   Antibiotics   Anti-infectives    Start     Dose/Rate Route Frequency Ordered Stop   05/10/15 1100  piperacillin-tazobactam (ZOSYN) IVPB 3.375 g     3.375 g 12.5 mL/hr over 240 Minutes Intravenous 3 times per day 05/10/15 0242     05/10/15 0245  piperacillin-tazobactam (ZOSYN) IVPB 3.375 g     3.375 g 100 mL/hr over 30 Minutes Intravenous STAT 05/10/15 0242 05/10/15 0358        Subjective:   Robert [name] was seen and examined today.  overnight events noted, still having minimal abdominal pain, febrile, hypotensive. Alert and oriented, son at the bedside. Patient denies dizziness, chest pain, new weakness, numbess, tingling.   Objective:   Blood pressure 104/70, pulse 94, temperature 101.8 F (38.8 C), temperature source Rectal, resp. rate 26, height 5\' 10"  (1.778 m), weight 84.369 kg (186 lb), SpO2 100 %.  Wt Readings from Last 3 Encounters:  05/10/15 84.369  kg (186 lb)  05/05/15 81.738 kg (180 lb 3.2 oz)  02/01/15 81.375 kg (179 lb 6.4 oz)     Intake/Output Summary (Last 24 hours) at 05/10/15 1143 Last data filed at 05/10/15 1100  Gross per 24 hour  Intake 5216.67 ml  Output    525 ml  Net 4691.67 ml    Exam  General: Alert and oriented x 3, NAD  HEENT:  PERRLA, EOMI,  Neck: Supple, no JVD  CVS: S1 S2 auscultated, 2/6SEM at RUSB  Respiratory:CTA B   Abdomen:  mild tenderness in the periumbilical and right upper quadrant area, + bowel sounds  Ext: no cyanosis clubbing or edema  Neuro: AAOx3, Cr N's II- XII. Strength 5/5 upper and lower extremities bilaterally  Skin: No rashes  Psych: Normal affect and demeanor, alert and oriented x3    Data Review   Micro Results No results found for this or any previous visit (from the past 240 hour(s)).  Radiology Reports Ct Abdomen Pelvis Wo Contrast  05/10/2015  CLINICAL DATA:  Acute onset epigastric and periumbilical pain. Initial encounter.  EXAM: CT ABDOMEN AND PELVIS WITHOUT CONTRAST  TECHNIQUE: Multidetector CT imaging of the abdomen and pelvis was performed following the standard protocol without IV contrast.  COMPARISON:  Abdominal ultrasound performed 08/23/2014, and abdominal radiograph performed 05/08/2015  FINDINGS: The visualized lung bases are clear. A few small metallic densities are seen scattered about the liver. Scattered coronary artery calcifications are seen.  The liver and spleen are unremarkable in appearance. There is mild soft tissue stranding about the gallbladder, with trace pericholecystic fluid. Stones are seen within the gallbladder. This raises question for mild acute cholecystitis. The pancreas and adrenal glands are unremarkable.  Nonspecific perinephric stranding is noted bilaterally. Mild bilateral renal atrophy is noted. There is no evidence of hydronephrosis. No renal or ureteral stones are identified.  No free fluid is identified. The small bowel is  unremarkable in appearance. The stomach is within normal limits. No acute vascular abnormalities are seen. Scattered calcification is noted along the abdominal aorta and its branches.  The appendix is normal in caliber and contains contrast, without evidence for appendicitis. Contrast passes through the level of the mid transverse colon. The colon is unremarkable in appearance.  The bladder is mildly distended and grossly unremarkable. The prostate is enlarged, measuring 6.4 cm in transverse dimension, with impression on the base of the bladder. An anterior abdominal wall mesh is noted at the lower abdominal wall. No inguinal lymphadenopathy is seen.  No acute osseous abnormalities are identified. Disc space narrowing and vacuum phenomenon are seen at L4-L5.  IMPRESSION: 1. Mild soft tissue stranding about the gallbladder, with trace pericholecystic fluid. Underlying cholelithiasis noted. Findings raise question for mild acute cholecystitis. 2. Scattered coronary artery calcifications seen. 3. Mild bilateral renal atrophy noted. 4. Scattered calcification along the abdominal aorta and its branches. 5. Enlarged prostate noted, with impression on the base of the bladder. Would correlate with PSA, as deemed clinically appropriate. These results were called by telephone at the time of interpretation on 05/10/2015 at 2:05 am to [facility] on [facility] 3W, who verbally acknowledged these results.   Electronically Signed   By: Garald Balding M.D.   On: 05/10/2015 02:06   Dg Chest 2 View  05/08/2015   CLINICAL DATA:  Acute onset of generalized chest pain, shortness of breath and nausea. Initial encounter.  EXAM: CHEST  2 VIEW  COMPARISON:  Chest radiograph from 01/11/2012  FINDINGS: The lungs are well-aerated and clear. There is no evidence of focal opacification, pleural effusion or pneumothorax.  The heart is normal in size; the mediastinal contour is within normal limits. No acute osseous abnormalities are seen. Scattered  clips are noted about the right upper quadrant.  IMPRESSION: No acute cardiopulmonary process seen.   Electronically Signed   By: Garald Balding M.D.   On: 05/08/2015 05:30   Nm Myocar Multi W/spect W/wall Motion / Ef  05/08/2015   CLINICAL DATA:  Chest pain and history of coronary artery disease with prior PCI and myocardial infarction.  EXAM: MYOCARDIAL IMAGING WITH SPECT (REST AND PHARMACOLOGIC-STRESS)  GATED LEFT VENTRICULAR WALL MOTION STUDY  LEFT VENTRICULAR EJECTION FRACTION  TECHNIQUE: Standard myocardial SPECT imaging was performed after resting intravenous injection of 10 mCi Tc-12m sestamibi. Subsequently, intravenous infusion of Lexiscan was performed under the supervision of the Cardiology staff. At peak effect of the drug, 30 mCi Tc-10m sestamibi was injected intravenously and standard myocardial SPECT imaging was performed. Quantitative gated imaging was also performed  to evaluate left ventricular wall motion, and estimate left ventricular ejection fraction.  COMPARISON:  None.  FINDINGS: Perfusion: There is a large fixed perfusion defect involving the inferior and inferolateral walls consistent with scar. Subtle decreased perfusion of the distal anterior and mid to distal anterolateral wall present. This does not register as a significant quantitative reversible defect by soft for analysis. Subtle inducible ischemia in this region cannot be excluded.  Wall Motion: The left ventricle is moderately dilated. Wall motion analysis demonstrates a nearly akinetic inferior wall with some motion present near the apex. The lateral and septal walls are hypokinetic. The anterior wall shows normal motion.  Left Ventricular Ejection Fraction: 36 %  End diastolic volume [id number] ml  End systolic volume 123XX123 ml  IMPRESSION: 1. Large fixed scar involving the inferior and inferolateral walls consistent with prior infarction. Possible subtle distal anterior and mid to distal anterolateral wall ischemia. As above, this is  not a significant quantitative reversible defect but subjectively does appear mildly diminished on the stress acquisition.  2. Akinetic inferior wall. Lateral and septal hypokinesis. Moderately dilated left ventricular cavity.  3. Left ventricular ejection fraction 36%  4. Intermediate-risk stress test findings*.  *2012 Appropriate Use Criteria for Coronary Revascularization Focused Update: J Am Coll Cardiol. [id number]. http://content.[email or web address].aspx?articleid=1201161   Electronically Signed   By: Aletta Edouard M.D.   On: 05/08/2015 13:37   Dg Abd 2 Views  05/08/2015   CLINICAL DATA:  Intermittent periumbilical abdominal pain, diarrhea, history of prostate cancer  EXAM: ABDOMEN - 2 VIEW  COMPARISON:  None.  FINDINGS: Nonobstructive bowel gas pattern.  No evidence of free air under the diaphragm on the upright view.  Lower abdominal/ inguinal hernia mesh repair.  Degenerative changes of the lower lumbar spine.  IMPRESSION: No evidence of small bowel obstruction or free air.   Electronically Signed   By: Julian Hy M.D.   On: 05/08/2015 17:00    CBC  Recent Labs Lab 05/08/15 0419 05/09/15 0138 05/10/15 0424  WBC 10.5 11.0* 23.5*  HGB 10.3* 10.0* 9.1*  HCT 30.8* 29.4* 27.0*  PLT 309 265 198  MCV 106.6* 105.4* 107.6*  MCH 35.6* 35.8* 36.3*  MCHC 33.4 34.0 33.7  RDW 16.1* 15.8* 16.0*    Chemistries   Recent Labs Lab 05/04/15 1034 05/08/15 0419 05/09/15 0138 05/09/15 1230 05/10/15 0424  NA 140 138 135  --  134*  K 4.2 4.4 4.5  --  3.8  CL 106 104 102  --  102  CO2 27 23 24   --  24  GLUCOSE 220* 193* 242*  --  164*  BUN 42* 31* 25*  --  25*  CREATININE 2.33* 2.30* 2.05*  --  2.38*  CALCIUM 9.1 9.3 9.0  --  7.9*  MG  --   --   --  2.3  --   AST  --   --   --   --  30  ALT  --   --   --   --  18  ALKPHOS  --   --   --   --  39  BILITOT  --   --   --   --  2.0*    ------------------------------------------------------------------------------------------------------------------ estimated creatinine clearance is 23.9 mL/min (by C-G formula based on Cr of 2.38). ------------------------------------------------------------------------------------------------------------------ No results for input(s): HGBA1C in the last 72 hours. ------------------------------------------------------------------------------------------------------------------ No results for input(s): CHOL, HDL, LDLCALC, TRIG, CHOLHDL, LDLDIRECT in the last 72 hours. ------------------------------------------------------------------------------------------------------------------  Recent Labs  05/09/15  1230  TSH 1.202   ------------------------------------------------------------------------------------------------------------------ No results for input(s): VITAMINB12, FOLATE, FERRITIN, TIBC, IRON, RETICCTPCT in the last 72 hours.  Coagulation profile  Recent Labs Lab 05/10/15 0424  INR 1.32    No results for input(s): DDIMER in the last 72 hours.  Cardiac Enzymes  Recent Labs Lab 05/08/15 2009 05/09/15 0138 05/10/15 0424  TROPONINI <0.03 <0.03 1.42*   ------------------------------------------------------------------------------------------------------------------ Invalid input(s): POCBNP   Recent Labs  05/09/15 0727 05/09/15 1147 05/09/15 1728 05/09/15 2143 05/10/15 0044 05/10/15 0722  GLUCAP 180* 218* 268* 148* 188* 171*     Lynsie Mcwatters M.D. [facility] 05/10/2015, 11:43 AM  Pager: [phone] Between 7am to 7pm - call Pager - [phone]  After 7pm go to [email or web address] - password [email or web address]  Call night coverage person covering after 7pm
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
## 2015-05-10 NOTE — Progress Notes (Signed)
       Patient Name: Robert [name] Date of Encounter: 05/10/2015    SUBJECTIVE: Events of the overnight are appreciated. The patient is now critically ill with hypotension, likely related to sepsis.  TELEMETRY:  Sinus rhythm and sinus tachycardia with occasional bradycardia into the 30s Filed Vitals:   05/10/15 0708 05/10/15 0723 05/10/15 0738 05/10/15 0753  BP: 93/50 97/52 76/43  77/37  Pulse: 91 89 84 82  Temp:    98.9 F (37.2 C)  TempSrc:    Oral  Resp: 29 22 26 20   Height:      Weight:      SpO2: 99% 100% 99% 100%    Intake/Output Summary (Last 24 hours) at 05/10/15 0803 Last data filed at 05/10/15 0636  Gross per 24 hour  Intake   3710 ml  Output    525 ml  Net   3185 ml   LABS: Basic Metabolic Panel:  Recent Labs  05/09/15 0138 05/09/15 1230 05/10/15 0424  NA 135  --  134*  K 4.5  --  3.8  CL 102  --  102  CO2 24  --  24  GLUCOSE 242*  --  164*  BUN 25*  --  25*  CREATININE 2.05*  --  2.38*  CALCIUM 9.0  --  7.9*  MG  --  2.3  --    CBC:  Recent Labs  05/09/15 0138 05/10/15 0424  WBC 11.0* 23.5*  HGB 10.0* 9.1*  HCT 29.4* 27.0*  MCV 105.4* 107.6*  PLT 265 198   Cardiac Enzymes:  Recent Labs  05/08/15 2009 05/09/15 0138 05/10/15 0424  TROPONINI <0.03 <0.03 1.42*   ECG: At 12:21 AM reveals sinus tachycardia with nonspecific T-wave flattening. This no improvement over the ischemic ST segment changes noted on EKG from 05/09/15 at 1629.  ECHOCARDIOGRAM: 05/09/2015 Ejection fraction 40%. Inferior wall akinesis. Moderate mitral regurgitation likely papillary muscle dysfunction from prior inferior infarct. Aortic valve sclerosis.  Radiology/Studies:  Abdominal CT scan revealed findings compatible with cholecystitis. Bilateral renal atrophy is noted. Prior abdominal ultrasound from November 2015 revealed gallstones.  Physical Exam: Blood pressure 77/37, pulse 82, temperature 98.9 F (37.2 C), temperature source Oral, resp. rate 20, height 5'  10" (1.778 m), weight 84.369 kg (186 lb), SpO2 100 %. Weight change: 2.041 kg (4 lb 8 oz)  Wt Readings from Last 3 Encounters:  05/10/15 84.369 kg (186 lb)  05/05/15 81.738 kg (180 lb 3.2 oz)  02/01/15 81.375 kg (179 lb 6.4 oz)   Awake but somewhat confused Chest is clear Cardiac exam with soft apical systolic murmur Abdominal exam reveals mild tenderness. No rebound is noted. Extremities reveal no edema  ASSESSMENT:  1. Likely cholecystitis with sepsis, hypotension, and lactic acidemia. 2. Elevated troponin, probably demand ischemia.  Known prior acute inferolateral infarcts treated with distal circumflex stent remotely. Known chronic total occlusion of the RCA. 3. Acute on chronic kidney injury with worsening and creatinine despite hydration primarily due to sepsis and hypotension. 4. Intermittent high-grade AV block with heart rates into the 40s. We were planning coronary angiography today to exclude ischemia mediated AV block versus chronic conduction system disease  Plan:  1. Cancel today's planned coronary angiogram 2. Move to intensive care unit and involve critical care medicine. 3. Monitor rhythm closely and consider temporary pacemaker symptomatic bradycardia 4. May need pressor therapy to support blood pressure.  Demetrios Isaacs 05/10/2015, 8:03 AM

## 2015-05-10 NOTE — Significant Event (Addendum)
Rapid Response Event Note  Overview: Time Called: 0025 Arrival Time: 0027 Event Type: Hypotension, Cardiac  Initial Focused Assessment:   Called by Sharee Pimple, RN to place pt on radar for RRT  and  to place a "second set of eyes on pt". Pt developed 102.9 fever with shaking chills and rigors, diaphoresis and nausea  at 2347, was incontinent of urine  and has had episodes of tachy /brady rhythms tonight.Marland Kitchen He is  now hypotensive at 100/52.   Upon my arrival pt is sitting up in bed, ill appearing, flushed, skin hot to touch.  T 102.9 Received Tylenol 650 mg.   Blood culture results from 1830 hrs pending.  LA at 1830 hrs 2.2.   IV NS infusing at 100 cc /hr.   Denies CP  or SOB, c/o minor lower generalized abd pain. Oriented times 3, follows commands, MAE.   Cardiac monitor   ST 108 with occasional PVC's.  RR 20 with O2 sats 99% on 2l [place].  Bilat BS clear, diminished in bases.  Abd soft  , active bowel sounds.  CT abd results  Pending.                                                                                                    Interventions: NS bolus 500 cc ordered at 0050 for BP 94/47 HR 104,  Cool cloths applied to forehead. Halford Decamp off to [employer] [facility] [employer] .  Will continue to follow. F/U 0130  BP 112/50 HR 103 after 500 cc NS bolus. An additional 250 cc bolus was ordered by Chaney Malling, NP  F/U 0310   BP 72/32   HR 102  Repeat manual BP 80/38.   Tempt down 99.7       Additional 500 cc NS bolus ordered by Chaney Malling, NP and  pt upgraded to SDU..   CT abd  Resulted with  Mild acute cholecystytis   With underlying Cholelithiasis.     Second IV site to be established.  Hand off to [place], [place].  Will continue to follow     Event Summary: Name of Physician Notified: Chaney Malling, NP at [place]    at    Outcome: Stayed in room and stabalized     Tiarra Anastacio, Gust Brooms

## 2015-05-10 NOTE — Progress Notes (Signed)
Patient's temp 102.3 after tylenol suppository.  Dr. Tana Coast paged and notified, orders received.  [name]

## 2015-05-10 NOTE — Progress Notes (Signed)
Pharmacy Consult - Heparin  Heparin level therapeutic this evening  Plan: Continue heparin at current rate Follow up AM labs  Thank you. Anette Guarneri, PharmD

## 2015-05-10 NOTE — Care Management Note (Signed)
Case Management Note  Patient Details  Name: Robert [name] MRN: [id number] Date of Birth: March 19, 1928  Subjective/Objective: 1228 05-10-15 Jacqlyn Krauss, RN,BSN -Pt admitted for Sepsis with hypotension, acute cholecystitis-CT abdomen showed mild soft tissue stranding about the gallbladder, mild acute cholecystitis.-Blood cultures in process, Per MD notes: Overnight events noted, significant lower BP readings,  1 L bolus and currently IV fluids on 125 mL an hour. BP has improved somewhat however still febrile.                  Action/Plan: CM will continue to monitor for disposition needs.    Expected Discharge Date:                  Expected Discharge Plan:  [place]  In-House Referral:     Discharge planning Services  CM Consult  Post Acute Care Choice:    Choice offered to:     DME Arranged:    DME Agency:     HH Arranged:    [facility] Agency:     Status of Service:  In process, will continue to follow  Medicare Important Message Given:    Date Medicare IM Given:    Medicare IM give by:    Date Additional Medicare IM Given:    Additional Medicare Important Message give by:     If discussed at [place] of Stay Meetings, dates discussed:    Additional Comments:  Bethena Roys, RN 05/10/2015, 12:12 PM

## 2015-05-10 NOTE — Progress Notes (Signed)
CRITICAL VALUE ALERT  Critical value received:  Troponin 1.42  Date of notification:  05/10/15  Time of notification:  [id number]  Critical value read back:Yes.    Nurse who received alert:  Ronette Deter  MD notified (1st page):  K.Schorr, NP  Time of first page:  [phone]  MD notified (2nd page):  Time of second page:  Responding MD: K.Schorr, NP  Time MD responded:  [phone]

## 2015-05-10 NOTE — Progress Notes (Addendum)
ANTIBIOTIC/ANTICOAGULATION CONSULT NOTE  Pharmacy Consult for Vancomycin, Zosyn and Heparin Indication: cholecystitis/sepsis and r/o ACS  No Known Allergies  Patient Measurements: Height: 5\' 10"  (177.8 cm) Weight: 186 lb (84.369 kg) IBW/kg (Calculated) : 73  Heparin dosing wt: 80 kg  Vital Signs: Temp: 101.8 F (38.8 C) (08/16 1138) Temp Source: Rectal (08/16 1138) BP: 104/43 mmHg (08/16 1225) Pulse Rate: 97 (08/16 1225) Intake/Output from previous day: 08/15 0701 - 08/16 0700 In: 3741.7 [P.O.:960; I.V.:1881.7; IV Piggyback:800] Out: 525 [Urine:525] Intake/Output from this shift: Total I/O In: 1475 [I.V.:475; IV Piggyback:1000] Out: -   Labs:  Recent Labs  05/08/15 0419 05/09/15 0138 05/10/15 0424  WBC 10.5 11.0* 23.5*  HGB 10.3* 10.0* 9.1*  PLT 309 265 198  CREATININE 2.30* 2.05* 2.38*   Estimated Creatinine Clearance: 23.9 mL/min (by C-G formula based on Cr of 2.38). No results for input(s): VANCOTROUGH, VANCOPEAK, VANCORANDOM, GENTTROUGH, GENTPEAK, GENTRANDOM, TOBRATROUGH, TOBRAPEAK, TOBRARND, AMIKACINPEAK, AMIKACINTROU, AMIKACIN in the last 72 hours.   Microbiology: Recent Results (from the past 720 hour(s))  Culture, blood (routine x 2)     Status: None (Preliminary result)   Collection Time: 05/09/15  6:25 PM  Result Value Ref Range Status   Specimen Description BLOOD LEFT HAND  Final   Special Requests BOTTLES DRAWN AEROBIC AND ANAEROBIC 5CC  Final   Culture NO GROWTH < 24 HOURS  Final   Report Status PENDING  Incomplete  Culture, blood (routine x 2)     Status: None (Preliminary result)   Collection Time: 05/09/15  6:30 PM  Result Value Ref Range Status   Specimen Description BLOOD RIGHT HAND  Final   Special Requests IN PEDIATRIC BOTTLE 3CC  Final   Culture  Setup Time   Final    GRAM NEGATIVE COCCI IN CHAINS AEROBIC BOTTLE ONLY CRITICAL RESULT CALLED TO, READ BACK BY AND VERIFIED WITH: J LAUER 05/10/15 @ [place].    Culture NO GROWTH < 24  HOURS  Final   Report Status PENDING  Incomplete  Assessment: 81 y.o. male presented on 8/14 with CP. 8/16 developed fever of 103 and chills.  ID: acute cholecystitis, sepsis; started on empiric zosyn this am, vancomycin added. Tmax 103, Tc 101.8. PCT 0.58. LA 2.2>>1.6. Scr trending up 2.0>>2.3  R/o acs - ce's +, he continues on IV heparin. Initial level is at goal on 1000 units/hr. No bleeding noted, hgb down slightly to 9.1. pltc within normal limits. Will check confirmatory level later tonight.  Goal of Therapy:  Resolution of infection  HL goal 0.3-0.7 Vancomycin trough goal 15-20  Plan:  Continue heparin at 1000 units/hr>>recheck HL @2000  Vancomycin 1250mg  x1 then 1g q24 hours Zosyn 3.375gm IV q8h Will f/u micro data, renal function, and pt's clinical condition  Erin Hearing PharmD., BCPS Clinical Pharmacist Pager [phone] 05/10/2015 1:59 PM

## 2015-05-10 NOTE — Progress Notes (Signed)
Pt hypotensive this AM after receiving total of 1522ml bolus from 0000-0700 05/10/15 and maintenance fluids of NS @100ml /hr throughout shift.

## 2015-05-10 NOTE — Progress Notes (Signed)
Called about elevated troponin of 1.42, previous had been negative x 3. Plan was hydration overnight for cath, however, he has become febrile and progressively hypotensive - elevated lactate concerning for sepsis. Discontinue subcutaneous lovenox and switch to IV heparin per pharmacy. Patient is on this hospitalist service and I suggested that PCCM be contacted, the patient will likely need to be transferred to the MICU and pressors initiated. He is not in condition for cardiac catheterization at this time.  Pixie Casino, MD, [facility] Attending Cardiologist [place]

## 2015-05-10 NOTE — Progress Notes (Signed)
Spoke to Dr. Tana Coast, she stated to hold all po medications but OK to give one time dose of ibuprofen.

## 2015-05-10 NOTE — Consult Note (Addendum)
PULMONARY / CRITICAL CARE MEDICINE   Name: Robert [name] MRN: [id number] DOB: 08-Apr-1928    ADMISSION DATE:  05/08/2015 CONSULTATION DATE:  05/10/2015  REFERRING MD :  Dr. Tana Coast  CHIEF COMPLAINT:  Chest tightness  INITIAL PRESENTATION: 81 year old male admitted for chest pain 8/14. Initial cardiac ischemia workup was relatively benign. Developed more of an infectious appearance over the course of his hospitalization thus far with abdominal pain. CT shows questionable cholecystitis. Surgery wants HIDA scan to confirm prior to intervention of any type. 8/16 early AM developed increasing fevers, tachycardia, and hypotension. PCCM to see for possible ICU transfer.   STUDIES:  CT abd/pelvis 8/16 > findings compatible with cholecystitis. Bilateral renal atrophy is noted.  Echo 8/15 > Ejection fraction 40%. Inferior wall akinesis. Moderate mitral regurgitation likely papillary muscle dysfunction from prior inferior infarct. Aortic valve sclerosis.  SIGNIFICANT EVENTS: 8/14 admitted for chest pressure 8/16 relative hemodynamic instability in presumed setting of sepsis. Improved with volume. PCCM to see.    HISTORY OF PRESENT ILLNESS:  81 year old male with PMH as below, which includes CAD s/p PCI [id number], DM2, Systolic CHF (EF [id number]), CKD, and prostate Ca. He reports being a very active elderly male. [place] and walk around without difficulty. Sat 8/13 he developed chest pressure, SOB, and nausea, for which he reported to the emergency department. He felt these symptoms were similar to those at the time of his previous MI. In ED initial ischemic workup was negative, including troponin wnl. He was admitted to the hospital team for chest pain. Cardiology was consulted and noted some typical and atypical chest pain features. He underwent Lexiscan Myoview for further eva which showed inferior scar, and could not exclude inducible ischemia. 8/15 AM he had an episode  of symptomatic bradycardia. He converted to Wenckebach, then to 2:1, then to complete heart block for about 2 minutes with associated SOB. This resolved spontaneously and so did he symptoms. He was to go for cardiac cath 8/16, however overnight he developed high fevers, tachycardia, and hypotension with some complaints of mild abdominal pain. He went for CT of the abdomen, which was suspicious for cholecystitis. He was provided with IVF resuscitation which helped these symptoms greatly. Surgery was consulted and would like to assess HIDA scan before proceeding. They believe he is a better candidate for per chole drain as opposed to cholecystectomy. PCCM was consulted for further evaluation and possible ICU transfer.    PAST MEDICAL HISTORY :   has a past medical history of CAD (coronary artery disease), native coronary artery; Severe mitral regurgitation; Type II or unspecified type diabetes mellitus with renal manifestations, not stated as uncontrolled; PVD (peripheral vascular disease) (39% bilateral carotids); Angina; Blood transfusion; Anemia; Acute MI inferior posterior subsequent episode care; High cholesterol; Heart murmur; Prostate cancer; Diabetes mellitus; GI bleeding; and Anxiety.  has past surgical history that includes liver laceration (1956); Coronary angioplasty with stent (08/2003); Coronary angioplasty with stent (12/2010); Inguinal hernia repair (02/2000); Lumbar disc surgery (~ 1990's); Back surgery; Esophagogastroduodenoscopy (10/19/2011); and Endoscopic retrograde cholangiopancreatography (ercp) with propofol (N/A, 09/09/2014). Prior to Admission medications   Medication Sig Start Date End Date Taking? Authorizing Provider  ALPRAZolam (XANAX) 0.25 MG tablet Take 0.25 mg by mouth at bedtime.  05/13/13  Yes Historical Provider, MD  clopidogrel (PLAVIX) 75 MG tablet Take 1 tablet by mouth  daily 04/18/15  Yes Jettie Booze, MD  furosemide (LASIX) 40 MG  tablet Take one-half tablet by   mouth twice a day 01/17/15  Yes Jettie Booze, MD  insulin lispro (HUMALOG KWIKPEN) 100 UNIT/ML KiwkPen 0-10 units three times per day Patient taking differently: Inject 7 Units into the skin 3 (three) times daily. 0-10 units three times per day 04/22/14  Yes Elayne Snare, MD  isosorbide mononitrate (IMDUR) 30 MG 24 hr tablet Take one-half tablet by  mouth daily 01/17/15  Yes Jettie Booze, MD  LANTUS SOLOSTAR 100 UNIT/ML Solostar Pen Inject subcutaneously 20  units daily Patient taking differently: Inject subcutaneously 14  units daily 09/10/14  Yes Elayne Snare, MD  metoprolol tartrate (LOPRESSOR) 25 MG tablet Take one-half tablet by  mouth once a day in the  evening Patient taking differently: Take 12.5 mg by mouth every evening. Take one-half tablet by  mouth once a day in the  evening 01/17/15  Yes Jettie Booze, MD  pravastatin (PRAVACHOL) 80 MG tablet Take 1 tablet by mouth  daily 05/02/15  Yes Jettie Booze, MD  BD PEN NEEDLE NANO U/F 32G X 4 MM MISC Use to inject insulin 4  times daily as instructed. 01/25/15   Elayne Snare, MD  glucose blood (ONE TOUCH ULTRA TEST) test strip Use as instructed to check blood sugar 3 times per day dx code Dx code E11.29 05/05/15   Elayne Snare, MD   No Known Allergies  FAMILY HISTORY:  indicated that his mother is deceased. He indicated that his father is deceased.  SOCIAL HISTORY:  reports that he has quit smoking. His smoking use included Cigars. He has never used smokeless tobacco. He reports that he does not drink alcohol or use illicit drugs.  REVIEW OF SYSTEMS:   Bolds are positive  Constitutional: weight loss, gain, night sweats, Fevers, chills, fatigue .  HEENT: headaches, Sore throat, sneezing, nasal congestion, post nasal drip, Difficulty swallowing, Tooth/dental problems, visual complaints visual changes, ear ache CV:  chest pain, radiates: ,Orthopnea, PND, swelling in lower extremities, dizziness, palpitations, syncope.  GI   heartburn, indigestion, abdominal pain, nausea, vomiting, diarrhea, change in bowel habits, loss of appetite, bloody stools.  Resp: cough, productive: , hemoptysis, dyspnea, chest pain, pleuritic.  Skin: rash or itching or icterus GU: dysuria, change in color of urine, urgency or frequency. flank pain, hematuria  MS: joint pain or swelling. decreased range of motion  Psych: change in mood or affect. depression or anxiety.  Neuro: difficulty with speech, weakness, numbness, ataxia   SUBJECTIVE: No events overnight  VITAL SIGNS: Temp:  [97.6 F (36.4 C)-103 F (39.4 C)] 101.8 F (38.8 C) (08/16 1138) Pulse Rate:  [75-121] 97 (08/16 1225) Resp:  [16-35] 35 (08/16 1225) BP: (59-193)/(29-162) 104/43 mmHg (08/16 1225) SpO2:  [97 %-100 %] 99 % (08/16 1225) Weight:  [84.369 kg (186 lb)] 84.369 kg (186 lb) (08/16 0354) HEMODYNAMICS:   VENTILATOR SETTINGS:   INTAKE / OUTPUT:  Intake/Output Summary (Last 24 hours) at 05/10/15 1326 Last data filed at 05/10/15 1100  Gross per 24 hour  Intake 5216.67 ml  Output    525 ml  Net 4691.67 ml   PHYSICAL EXAMINATION: General:  Elderly male in NAD Neuro:  Alert, oriented, non-focal HEENT:  Star/AT, no JVD noted, PERRL Cardiovascular:  RRR, 2/6 SEM Lungs:  Clear bilateral breath sounds, mild tachypnea. Unlabored Abdomen:  Soft, tender at RUQ, hyperactive BS Musculoskeletal:  No acute deformity or ROM limitation Skin:  Grossly intact  LABS:  CBC  Recent Labs Lab 05/08/15 0419 05/09/15  0138 05/10/15 0424  WBC 10.5 11.0* 23.5*  HGB 10.3* 10.0* 9.1*  HCT 30.8* 29.4* 27.0*  PLT 309 265 198   Coag's  Recent Labs Lab 05/10/15 0424  INR 1.32   BMET  Recent Labs Lab 05/08/15 0419 05/09/15 0138 05/10/15 0424  NA 138 135 134*  K 4.4 4.5 3.8  CL 104 102 102  CO2 23 24 24   BUN 31* 25* 25*  CREATININE 2.30* 2.05* 2.38*  GLUCOSE 193* 242* 164*   Electrolytes  Recent Labs Lab 05/08/15 0419 05/09/15 0138 05/09/15 1230  05/10/15 0424  CALCIUM 9.3 9.0  --  7.9*  MG  --   --  2.3  --    Sepsis Markers  Recent Labs Lab 05/09/15 1830 05/10/15 0424  LATICACIDVEN 2.2* 1.6  PROCALCITON 0.58  --    ABG No results for input(s): PHART, PCO2ART, PO2ART in the last 168 hours. Liver Enzymes  Recent Labs Lab 05/10/15 0424  AST 30  ALT 18  ALKPHOS 39  BILITOT 2.0*  ALBUMIN 2.7*   Cardiac Enzymes  Recent Labs Lab 05/08/15 2009 05/09/15 0138 05/10/15 0424  TROPONINI <0.03 <0.03 1.42*   Glucose  Recent Labs Lab 05/09/15 0727 05/09/15 1147 05/09/15 1728 05/09/15 2143 05/10/15 0044 05/10/15 0722  GLUCAP 180* 218* 268* 148* 188* 171*    Imaging Ct Abdomen Pelvis Wo Contrast  05/10/2015   CLINICAL DATA:  Acute onset epigastric and periumbilical pain. Initial encounter.  EXAM: CT ABDOMEN AND PELVIS WITHOUT CONTRAST  TECHNIQUE: Multidetector CT imaging of the abdomen and pelvis was performed following the standard protocol without IV contrast.  COMPARISON:  Abdominal ultrasound performed 08/23/2014, and abdominal radiograph performed 05/08/2015  FINDINGS: The visualized lung bases are clear. A few small metallic densities are seen scattered about the liver. Scattered coronary artery calcifications are seen.  The liver and spleen are unremarkable in appearance. There is mild soft tissue stranding about the gallbladder, with trace pericholecystic fluid. Stones are seen within the gallbladder. This raises question for mild acute cholecystitis. The pancreas and adrenal glands are unremarkable.  Nonspecific perinephric stranding is noted bilaterally. Mild bilateral renal atrophy is noted. There is no evidence of hydronephrosis. No renal or ureteral stones are identified.  No free fluid is identified. The small bowel is unremarkable in appearance. The stomach is within normal limits. No acute vascular abnormalities are seen. Scattered calcification is noted along the abdominal aorta and its branches.  The  appendix is normal in caliber and contains contrast, without evidence for appendicitis. Contrast passes through the level of the mid transverse colon. The colon is unremarkable in appearance.  The bladder is mildly distended and grossly unremarkable. The prostate is enlarged, measuring 6.4 cm in transverse dimension, with impression on the base of the bladder. An anterior abdominal wall mesh is noted at the lower abdominal wall. No inguinal lymphadenopathy is seen.  No acute osseous abnormalities are identified. Disc space narrowing and vacuum phenomenon are seen at L4-L5.  IMPRESSION: 1. Mild soft tissue stranding about the gallbladder, with trace pericholecystic fluid. Underlying cholelithiasis noted. Findings raise question for mild acute cholecystitis. 2. Scattered coronary artery calcifications seen. 3. Mild bilateral renal atrophy noted. 4. Scattered calcification along the abdominal aorta and its branches. 5. Enlarged prostate noted, with impression on the base of the bladder. Would correlate with PSA, as deemed clinically appropriate. These results were called by telephone at the time of interpretation on 05/10/2015 at 2:05 am to [facility] on [facility]W, who verbally acknowledged  these results.   Electronically Signed   By: Garald Balding M.D.   On: 05/10/2015 02:06   ASSESSMENT / PLAN:  Severe Sepsis secondary to possible cholecystitis (improved with adequate volume resuscitation)  GNR bacteremia? Cultures pending, aerobic bottle only - Continue broad ABX with Vanc, Zosyn - Trend PCT - Repeat lactic acid - Gentle IVF hydration (30cc/kg bolus completed) - Send UA, Urine Cx - Stable for HIDA scan at this time - Surgery to decide appropriate therapy pending those results.  - Continue to follow cultures for sensitivities.   Elevated troponin suspect demand ischemia from acute event earlier today.  CAD -Trend troponin - Per Primary, cardiology following.   Acute on CKD - Per Primary  Since  acute event this AM, the patient has received about 4 L of NS. He currently has no complaints, and has been hemodynamically stable since given volume. Appropriate to monitor in stepdown at this time under the direction of the hospitalist team. PCCM will be available in the setting of any acute decompensation.   Georgann Housekeeper, AGACNP-BC [place] [facility]/Critical Care Pager [phone] or [phone]  Attending Note:  81 year old male with an extensive PMH who presents to the hospital with cholecystitis and concern for septic shock.  PCCM called to evaluate for hypotension.  Patient has history of CHF.  Alert and interactive, moving all ext to command.  I reviewed CXR myself, evidence of pulmonary edema.  Discussed with [facility]-MD and PCCM-NP.  Severe sepsis: due to cholecystitis.  - Vanc/zosyn as ordered.  - F/u on cultures.  - Monitor vital signs closely.  - Source control via gal bladder drain CT guided.  Hypotension: due to sepsis, unlikely to be cardiogenic.  - IVF resuscitation.  - Check lactic acid level.  - Monitor for signs of pulmonary edema.  CHF: EF of 35-40%.  - Monitor O2 demand while getting fluid.  - Hold diureses for now.  Hypoxemia:   - Titrate O2 for sat of 88-92%.  - May require ambulatory desaturation study prior to discharge.  Ok to keep in SDU.  PCCM will follow.  Patient seen and examined, agree with above note.  I dictated the care and orders written for this patient under my direction.  Rush Farmer, MD [phone]   05/10/2015 1:58 PM

## 2015-05-10 NOTE — Progress Notes (Addendum)
CRITICAL VALUE ALERT  Critical value received:  +BLOOD CULTURE= GRAM POSITIVE COCCI IN CHAINS IN AEROBIC BOTTLE   Date of notification:  05/10/2015  Time of notification:  [id number]  Critical value read back:  YES  Nurse who received alert:  Nona Dell RN   MD notified (1st page):  DR. RAI  Time of first page:  [phone]  MD notified (2nd page):  Time of second page:  Responding MD:  DR. RAI   Time MD responded:  [phone]

## 2015-05-10 NOTE — Progress Notes (Signed)
Rapid response and NP Schorr notified. Patient found in bed diaphoretic, incontinent episode, tachypnic, T 102.9. EKG showed sinus tach with multiple PVCs. Heartrate varies between 140s and down to 35. Episode lasted for approx 30 minutes. Tylenol given, condom cath applied. Pt hooked up to bedside monitor for closer monitoring.

## 2015-05-10 NOTE — Progress Notes (Signed)
Shift event note:  Notified by RN regarding pt w/ increased temp (102.9), chills, diaphoresis and an episode of urinary incontinence. RR RN was paged and responded to bedside. IVF bolus was initiated (500 cc) and Tylenol 650 mg given PO. Temp then increased to 103. I was repaged and requested pt be given an additional Tylenol and another small IVF bolus (250 cc). By the time of my bedside visit pt temp was 99.7. BP somewhat soft at 99/45 (MAP 63), HR=112, RR-30 w/ 02 sats of 99% on 2L [place]. Skin was warm and dry and pt admitted to feeling better. Denied pain. BBS CTA. Abd is soft w/ normal bs. There is mild TTP over the RUQ. RN just rec'd notification from radiology (Dr Radene Knee) regarding Ct abd/pelvis w/o cm from 2130 that has findings c/w "mild acute cholecystitis" w/ underlying cholelithiasis. Lactic acid at 1830 was 2.2. Pt has been NPO w/ sips awaiting cath in am. Assessment/Plan: 1. Fever: Pt meets sepsis criteria given tachycardia, fever, increased RR and elevated lactic acid. Suspect infectious process related to acute cholecystitis. Zosyn started. Discussed pt w/ Dr Redmond Pulling w/ surgery service who has recommended a Hida scan in am. Surgery team will see pt in am. Will make pt strickly NPO. CBC, CMET, lactic acid and troponin pending. Pt's level of care has been changed to SDU. Discussed pt with Dr Hal Hope who is in agreement w/ plan. Will continue to monitor closely on SDU.   Jeryl Columbia, NP-C [facility] Pager [phone]

## 2015-05-11 ENCOUNTER — Encounter (HOSPITAL_COMMUNITY): Payer: Self-pay | Admitting: Physician Assistant

## 2015-05-11 ENCOUNTER — Inpatient Hospital Stay (HOSPITAL_COMMUNITY): Payer: Medicare Other

## 2015-05-11 DIAGNOSIS — R1011 Right upper quadrant pain: Secondary | ICD-10-CM | POA: Insufficient documentation

## 2015-05-11 DIAGNOSIS — R7989 Other specified abnormal findings of blood chemistry: Secondary | ICD-10-CM

## 2015-05-11 DIAGNOSIS — R079 Chest pain, unspecified: Secondary | ICD-10-CM

## 2015-05-11 DIAGNOSIS — K810 Acute cholecystitis: Secondary | ICD-10-CM | POA: Insufficient documentation

## 2015-05-11 LAB — HEPARIN LEVEL (UNFRACTIONATED): Heparin Unfractionated: 0.45 IU/mL (ref 0.30–0.70)

## 2015-05-11 LAB — URINALYSIS, ROUTINE W REFLEX MICROSCOPIC
Bilirubin Urine: NEGATIVE
Glucose, UA: NEGATIVE mg/dL
Ketones, ur: 15 mg/dL — AB
LEUKOCYTES UA: NEGATIVE
NITRITE: NEGATIVE
PH: 5.5 (ref 5.0–8.0)
Protein, ur: 30 mg/dL — AB
SPECIFIC GRAVITY, URINE: 1.017 (ref 1.005–1.030)
Urobilinogen, UA: 1 mg/dL (ref 0.0–1.0)

## 2015-05-11 LAB — PROCALCITONIN: Procalcitonin: 39.91 ng/mL

## 2015-05-11 LAB — BASIC METABOLIC PANEL
Anion gap: 7 (ref 5–15)
BUN: 30 mg/dL — AB (ref 6–20)
CALCIUM: 6.9 mg/dL — AB (ref 8.9–10.3)
CHLORIDE: 113 mmol/L — AB (ref 101–111)
CO2: 19 mmol/L — ABNORMAL LOW (ref 22–32)
CREATININE: 2.39 mg/dL — AB (ref 0.61–1.24)
GFR calc non Af Amer: 23 mL/min — ABNORMAL LOW (ref >60)
GFR, EST AFRICAN AMERICAN: 27 mL/min — AB (ref >60)
Glucose, Bld: 103 mg/dL — ABNORMAL HIGH (ref 65–99)
Potassium: 4.2 mmol/L (ref 3.5–5.1)
SODIUM: 139 mmol/L (ref 135–145)

## 2015-05-11 LAB — URINE MICROSCOPIC-ADD ON

## 2015-05-11 LAB — GLUCOSE, CAPILLARY
GLUCOSE-CAPILLARY: 103 mg/dL — AB (ref 65–99)
GLUCOSE-CAPILLARY: 153 mg/dL — AB (ref 65–99)
GLUCOSE-CAPILLARY: 134 mg/dL — AB (ref 65–99)
Glucose-Capillary: 93 mg/dL (ref 65–99)
Glucose-Capillary: 121 mg/dL — ABNORMAL HIGH (ref 65–99)

## 2015-05-11 LAB — MAGNESIUM: MAGNESIUM: 1.7 mg/dL (ref 1.7–2.4)

## 2015-05-11 LAB — TROPONIN I: Troponin I: 0.36 ng/mL — ABNORMAL HIGH (ref <0.031)

## 2015-05-11 LAB — CBC
HEMATOCRIT: 25 % — AB (ref 39.0–52.0)
Hemoglobin: 8.3 g/dL — ABNORMAL LOW (ref 13.0–17.0)
MCH: 35.9 pg — ABNORMAL HIGH (ref 26.0–34.0)
MCHC: 33.2 g/dL (ref 30.0–36.0)
MCV: 108.2 fL — AB (ref 78.0–100.0)
Platelets: 156 10*3/uL (ref 150–400)
RBC: 2.31 MIL/uL — ABNORMAL LOW (ref 4.22–5.81)
RDW: 16.7 % — AB (ref 11.5–15.5)
WBC: 14.9 10*3/uL — ABNORMAL HIGH (ref 4.0–10.5)

## 2015-05-11 LAB — LACTIC ACID, PLASMA: Lactic Acid, Venous: 0.8 mmol/L (ref 0.5–2.0)

## 2015-05-11 LAB — CORTISOL: CORTISOL PLASMA: 28.6 ug/dL

## 2015-05-11 LAB — PHOSPHORUS: PHOSPHORUS: 2.6 mg/dL (ref 2.5–4.6)

## 2015-05-11 LAB — MRSA PCR SCREENING: MRSA BY PCR: NEGATIVE

## 2015-05-11 MED ORDER — SODIUM CHLORIDE 0.9 % IV BOLUS (SEPSIS)
1000.0000 mL | Freq: Once | INTRAVENOUS | Status: AC
  Administered 2015-05-11: 1000 mL via INTRAVENOUS

## 2015-05-11 MED ORDER — NOREPINEPHRINE BITARTRATE 1 MG/ML IV SOLN
2.0000 ug/min | INTRAVENOUS | Status: DC
  Administered 2015-05-11: 2 ug/min via INTRAVENOUS
  Filled 2015-05-11: qty 4

## 2015-05-11 MED ORDER — FENTANYL CITRATE (PF) 100 MCG/2ML IJ SOLN
INTRAMUSCULAR | Status: AC
  Filled 2015-05-11: qty 4

## 2015-05-11 MED ORDER — LIDOCAINE HCL 1 % IJ SOLN
INTRAMUSCULAR | Status: AC
  Filled 2015-05-11: qty 20

## 2015-05-11 MED ORDER — HEPARIN SODIUM (PORCINE) 5000 UNIT/ML IJ SOLN
5000.0000 [IU] | Freq: Three times a day (TID) | INTRAMUSCULAR | Status: DC
  Administered 2015-05-12 – 2015-05-15 (×12): 5000 [IU] via SUBCUTANEOUS
  Filled 2015-05-11 (×12): qty 1

## 2015-05-11 MED ORDER — HYDROCORTISONE NA SUCCINATE PF 100 MG IJ SOLR
50.0000 mg | Freq: Four times a day (QID) | INTRAMUSCULAR | Status: DC
  Administered 2015-05-11 – 2015-05-13 (×8): 50 mg via INTRAVENOUS
  Filled 2015-05-11 (×2): qty 1
  Filled 2015-05-11 (×3): qty 2
  Filled 2015-05-11: qty 1
  Filled 2015-05-11 (×4): qty 2
  Filled 2015-05-11: qty 1

## 2015-05-11 MED ORDER — INSULIN ASPART 100 UNIT/ML ~~LOC~~ SOLN
0.0000 [IU] | SUBCUTANEOUS | Status: DC
  Administered 2015-05-11: 3 [IU] via SUBCUTANEOUS
  Administered 2015-05-11 – 2015-05-12 (×3): 2 [IU] via SUBCUTANEOUS

## 2015-05-11 MED ORDER — FENTANYL CITRATE (PF) 100 MCG/2ML IJ SOLN
INTRAMUSCULAR | Status: AC | PRN
  Administered 2015-05-11: 50 ug via INTRAVENOUS

## 2015-05-11 MED ORDER — MIDAZOLAM HCL 2 MG/2ML IJ SOLN
INTRAMUSCULAR | Status: AC | PRN
  Administered 2015-05-11: 1 mg via INTRAVENOUS

## 2015-05-11 MED ORDER — MIDAZOLAM HCL 2 MG/2ML IJ SOLN
INTRAMUSCULAR | Status: AC
  Filled 2015-05-11: qty 4

## 2015-05-11 MED ORDER — FENTANYL CITRATE (PF) 100 MCG/2ML IJ SOLN
50.0000 ug | INTRAMUSCULAR | Status: DC | PRN
  Administered 2015-05-11 – 2015-05-12 (×2): 50 ug via INTRAVENOUS
  Filled 2015-05-11 (×2): qty 2

## 2015-05-11 MED ORDER — IOHEXOL 300 MG/ML  SOLN
12.0000 mL | Freq: Once | INTRAMUSCULAR | Status: DC | PRN
  Administered 2015-05-11: 12 mL
  Filled 2015-05-11: qty 20

## 2015-05-11 MED ORDER — SODIUM CHLORIDE 0.9 % IV BOLUS (SEPSIS)
500.0000 mL | Freq: Once | INTRAVENOUS | Status: AC
  Administered 2015-05-11: 500 mL via INTRAVENOUS

## 2015-05-11 NOTE — Progress Notes (Signed)
       Patient Name: Robert [name] Date of Encounter: 05/11/2015    SUBJECTIVE: No cardiopulmonary complaints. Had persistent hypotension and was transferred to unit early this morning. Currently on Levothroid.  TELEMETRY:  Normal sinus rhythm. No significant bradycardia since beta blockers been discontinued. Filed Vitals:   05/11/15 0801 05/11/15 0900 05/11/15 1000 05/11/15 1105  BP:  104/52 123/68 120/56  Pulse:  75 85 87  Temp: 97.6 F (36.4 C)     TempSrc: Oral     Resp:  19 25 24   Height:      Weight:      SpO2:  100% 100% 99%    Intake/Output Summary (Last 24 hours) at 05/11/15 1126 Last data filed at 05/11/15 1100  Gross per 24 hour  Intake 3879.92 ml  Output    600 ml  Net 3279.92 ml   LABS: Basic Metabolic Panel:  Recent Labs  05/09/15 1230 05/10/15 0424 05/11/15 0530  NA  --  134* 139  K  --  3.8 4.2  CL  --  102 113*  CO2  --  24 19*  GLUCOSE  --  164* 103*  BUN  --  25* 30*  CREATININE  --  2.38* 2.39*  CALCIUM  --  7.9* 6.9*  MG 2.3  --  1.7  PHOS  --   --  2.6   CBC:  Recent Labs  05/10/15 0424 05/11/15 0530  WBC 23.5* 14.9*  HGB 9.1* 8.3*  HCT 27.0* 25.0*  MCV 107.6* 108.2*  PLT 198 156   Cardiac Enzymes:  Recent Labs  05/10/15 0424 05/10/15 1540 05/11/15 0530  TROPONINI 1.42* 0.80* 0.36*    Radiology/Studies:  High scan compatible with cholecystitis  Physical Exam: Blood pressure 120/56, pulse 87, temperature 97.6 F (36.4 C), temperature source Oral, resp. rate 24, height 5\' 8"  (1.727 m), weight 89 kg (196 lb 3.4 oz), SpO2 99 %. Weight change: 4.631 kg (10 lb 3.4 oz)  Wt Readings from Last 3 Encounters:  05/11/15 89 kg (196 lb 3.4 oz)  05/05/15 81.738 kg (180 lb 3.2 oz)  02/01/15 81.375 kg (179 lb 6.4 oz)    Neck veins are not distended Chest is clear anteriorly and posteriorly Cardiac exam reveals no rub or murmur. Extremities reveal no edema. Abdomen reveals mild right upper quadrant  tenderness  ASSESSMENT:  1. Elevated troponin in the setting of known right coronary occlusion. Likely stress related 2. Sepsis with hypotension related to cholecystitis 3. Transient high-grade AV block, no recurrence since discontinuation of beta blocker therapy 4. Coronary artery disease with no active anginal complaints 5. Severe anemia  Plan:  1. Under the current clinical situation, I do not believe that continuous IV heparin for ACS as necessary. I'm concerned about the patient's anemia and the procedures and need to be performed. We she will revert to DVT prophylaxis. 2. No plan for temporary pacemaker or an ischemic workup until infection/cholecystitis more stable. This approach could change if the patient develops ischemic symptoms or significant recurrent bradycardia arrhythmia   Signed, Sinclair Grooms 05/11/2015, 11:26 AM

## 2015-05-11 NOTE — Progress Notes (Signed)
Per Montey Hora, PA, the IV Heparin was stopped at 0400 for central line placement and the restarted 1 hour later at 0500 after central line was placed.

## 2015-05-11 NOTE — Progress Notes (Signed)
Progress Note [id number] *Surgery contacted with results of HIDA scan, they advised to keep pt strict NPO for possible surgery 0000000 *pt systolic BP consistently in 70's, rises during bolus and drops when bolus finishes or pt moves (on/off bedpan). K.Schorr, NP communicated with for multiple NS boluses & concern for possible transfer to higher level of care.  *Total fluids including abx & heparin = 2.5L during this time. Pt lungs continue to sound clear but diminished at bases. *550 UO at 2230 from external catheter bag, 0 output as of 0144. *Rectal T has decreased from 99.0 to 97.6 *pt awakens to voice and is oriented x3, Was given ativan IV instead of normal xanax PO at bedtime for sleep.  Pt seems to be resting comfortably. Will continue to monitor closely.

## 2015-05-11 NOTE — Progress Notes (Signed)
Patient ID: Robert [name], male   DOB: November 08, 1927, 81 y.o.   MRN: [id number]    Subjective: Pt feels better today, less pain  Objective: Vital signs in last 24 hours: Temp:  [97.6 F (36.4 C)-102.3 F (39.1 C)] 97.6 F (36.4 C) (08/17 0801) Pulse Rate:  [32-97] 77 (08/17 0800) Resp:  [14-36] 20 (08/17 0800) BP: (55-118)/(21-83) 105/61 mmHg (08/17 0800) SpO2:  [87 %-100 %] 100 % (08/17 0800) Weight:  [89 kg (196 lb 3.4 oz)] 89 kg (196 lb 3.4 oz) (08/17 0315) Last BM Date: 05/10/15  Intake/Output from previous day: 08/16 0701 - 08/17 0700 In: 4675 [I.V.:2087.5; IV Piggyback:2587.5] Out: 600 [Urine:600] Intake/Output this shift:    PE: Abd: soft, less tender in RUQ, +BS, ND  Lab Results:   Recent Labs  05/10/15 0424 05/11/15 0530  WBC 23.5* 14.9*  HGB 9.1* 8.3*  HCT 27.0* 25.0*  PLT 198 156   BMET  Recent Labs  05/10/15 0424 05/11/15 0530  NA 134* 139  K 3.8 4.2  CL 102 113*  CO2 24 19*  GLUCOSE 164* 103*  BUN 25* 30*  CREATININE 2.38* 2.39*  CALCIUM 7.9* 6.9*   PT/INR  Recent Labs  05/10/15 0424  LABPROT 16.5*  INR 1.32   CMP     Component Value Date/Time   NA 139 05/11/2015 0530   K 4.2 05/11/2015 0530   CL 113* 05/11/2015 0530   CO2 19* 05/11/2015 0530   GLUCOSE 103* 05/11/2015 0530   BUN 30* 05/11/2015 0530   CREATININE 2.39* 05/11/2015 0530   CREATININE 2.48* 07/27/2014 1032   CALCIUM 6.9* 05/11/2015 0530   PROT 5.2* 05/10/2015 0424   ALBUMIN 2.7* 05/10/2015 0424   AST 30 05/10/2015 0424   ALT 18 05/10/2015 0424   ALKPHOS 39 05/10/2015 0424   BILITOT 2.0* 05/10/2015 0424   GFRNONAA 23* 05/11/2015 0530   GFRAA 27* 05/11/2015 0530   Lipase  No results found for: LIPASE     Studies/Results: Ct Abdomen Pelvis Wo Contrast  05/10/2015   CLINICAL DATA:  Acute onset epigastric and periumbilical pain. Initial encounter.  EXAM: CT ABDOMEN AND PELVIS WITHOUT CONTRAST  TECHNIQUE: Multidetector CT imaging of the abdomen and pelvis was  performed following the standard protocol without IV contrast.  COMPARISON:  Abdominal ultrasound performed 08/23/2014, and abdominal radiograph performed 05/08/2015  FINDINGS: The visualized lung bases are clear. A few small metallic densities are seen scattered about the liver. Scattered coronary artery calcifications are seen.  The liver and spleen are unremarkable in appearance. There is mild soft tissue stranding about the gallbladder, with trace pericholecystic fluid. Stones are seen within the gallbladder. This raises question for mild acute cholecystitis. The pancreas and adrenal glands are unremarkable.  Nonspecific perinephric stranding is noted bilaterally. Mild bilateral renal atrophy is noted. There is no evidence of hydronephrosis. No renal or ureteral stones are identified.  No free fluid is identified. The small bowel is unremarkable in appearance. The stomach is within normal limits. No acute vascular abnormalities are seen. Scattered calcification is noted along the abdominal aorta and its branches.  The appendix is normal in caliber and contains contrast, without evidence for appendicitis. Contrast passes through the level of the mid transverse colon. The colon is unremarkable in appearance.  The bladder is mildly distended and grossly unremarkable. The prostate is enlarged, measuring 6.4 cm in transverse dimension, with impression on the base of the bladder. An anterior abdominal wall mesh is noted at the lower abdominal wall. No  inguinal lymphadenopathy is seen.  No acute osseous abnormalities are identified. Disc space narrowing and vacuum phenomenon are seen at L4-L5.  IMPRESSION: 1. Mild soft tissue stranding about the gallbladder, with trace pericholecystic fluid. Underlying cholelithiasis noted. Findings raise question for mild acute cholecystitis. 2. Scattered coronary artery calcifications seen. 3. Mild bilateral renal atrophy noted. 4. Scattered calcification along the abdominal aorta  and its branches. 5. Enlarged prostate noted, with impression on the base of the bladder. Would correlate with PSA, as deemed clinically appropriate. These results were called by telephone at the time of interpretation on 05/10/2015 at 2:05 am to [facility] on [facility]W, who verbally acknowledged these results.   Electronically Signed   By: Garald Balding M.D.   On: 05/10/2015 02:06   Nm Hepatobiliary Liver Func  05/10/2015   CLINICAL DATA:  Right upper quadrant pain. Rule out acute cholecystitis.  EXAM: NUCLEAR MEDICINE HEPATOBILIARY IMAGING  TECHNIQUE: Sequential images of the abdomen were obtained out to 60 minutes following intravenous administration of radiopharmaceutical. At the 90 minute mark, 3.4 mg of morphine were administered.  RADIOPHARMACEUTICALS:  5.4 mCi Tc-71m  Choletec IV  COMPARISON:  CT of 05/09/2015  FINDINGS: Initial images demonstrate prompt uptake of tracer by the liver. Common duct activity is identified at 10 minutes. Bowel activity is identified at 15 minutes. Through 90 minutes, no gallbladder activity is seen. With morphine, subsequent imaging for 30 minutes demonstrates no gallbladder activity.  IMPRESSION: Absent gallbladder activity, despite morphine augmentation. This most consistent with cystic duct obstruction/ acute cholecystitis.  These results will be called to the ordering clinician or representative by the Radiologist Assistant, and communication documented in the PACS or zVision Dashboard.   Electronically Signed   By: Abigail Miyamoto M.D.   On: 05/10/2015 19:19   Dg Chest Port 1 View  05/11/2015   CLINICAL DATA:  Central line placement.  EXAM: PORTABLE CHEST - 1 VIEW  COMPARISON:  Chest x-ray from 3 days prior  FINDINGS: New left IJ central line, tip at the upper SVC.  No pneumothorax.  Mild cardiomegaly and vascular pedicle widening. Possible trace right pleural effusion. No pulmonary edema.  IMPRESSION: New left IJ catheter without complicating features.   Electronically  Signed   By: Monte Fantasia M.D.   On: 05/11/2015 05:06    Anti-infectives: Anti-infectives    Start     Dose/Rate Route Frequency Ordered Stop   05/11/15 1200  vancomycin (VANCOCIN) IVPB 1000 mg/200 mL premix     1,000 mg 200 mL/hr over 60 Minutes Intravenous Every 24 hours 05/10/15 1224     05/10/15 1330  vancomycin (VANCOCIN) 1,250 mg in sodium chloride 0.9 % 250 mL IVPB     1,250 mg 166.7 mL/hr over 90 Minutes Intravenous  Once 05/10/15 1224 05/10/15 1531   05/10/15 1100  piperacillin-tazobactam (ZOSYN) IVPB 3.375 g     3.375 g 12.5 mL/hr over 240 Minutes Intravenous 3 times per day 05/10/15 0242     05/10/15 0245  piperacillin-tazobactam (ZOSYN) IVPB 3.375 g     3.375 g 100 mL/hr over 30 Minutes Intravenous STAT 05/10/15 0242 05/10/15 0358       Assessment/Plan  1. Acute cholecystitis -due to other co-morbidities we feel that the patient would benefit more from a perc chole drain at this point instead of surgical intervention.   -patient's plavix has been stopped in anticipation of perc drain.  Will defer timing to stop heparin drip to IR -d/w patient and son and all questions  were answered. -cont abx therapy 2. MMP -per primary service   LOS: 3 days    Carles Florea E 05/11/2015, 9:02 AM Pager: [phone]

## 2015-05-11 NOTE — Procedures (Signed)
Central Venous Catheter Insertion Procedure Note Raza Fristoe [id number] [birth year]  Procedure: Insertion of Central Venous Catheter Indications: Assessment of intravascular volume, Drug and/or fluid administration and Frequent blood sampling  Procedure Details Consent: Risks of procedure as well as the alternatives and risks of each were explained to the (patient/caregiver).  Consent for procedure obtained. Time Out: Verified patient identification, verified procedure, site/side was marked, verified correct patient position, special equipment/implants available, medications/allergies/relevent history reviewed, required imaging and test results available.  Performed  Maximum sterile technique was used including antiseptics, cap, gloves, gown, hand hygiene, mask and sheet. Skin prep: Chlorhexidine; local anesthetic administered A antimicrobial bonded/coated triple lumen catheter was placed in the left internal jugular vein using the Seldinger technique.  Evaluation Blood flow good Complications: No apparent complications Patient did tolerate procedure well. Chest X-ray ordered to verify placement.  CXR: pending.  Procedure performed under direct ultrasound guidance for real time vessel cannulation.      Montey Hora, [place] [facility] Pager: [phone]  or [phone] 05/11/2015, 4:42 AM   Required Agree  Lavon Paganini. Titus Mould, MD, [place] Pgr: [place] [facility]

## 2015-05-11 NOTE — Progress Notes (Addendum)
PULMONARY / CRITICAL CARE MEDICINE   Name: Robert [name] MRN: [id number] DOB: 1928/05/27    ADMISSION DATE:  05/08/2015 CONSULTATION DATE:  05/10/2015  REFERRING MD :  Dr. Tana Coast  CHIEF COMPLAINT:  Chest tightness  INITIAL PRESENTATION: 81 year old male admitted for chest pain 8/14. Initial cardiac ischemia workup was relatively benign. Developed more of an infectious appearance over the course of his hospitalization thus far with abdominal pain. CT shows questionable cholecystitis. Surgery wants HIDA scan to confirm prior to intervention of any type. 8/16 early AM developed increasing fevers, tachycardia, and hypotension. PCCM to see for possible ICU transfer.  8/17 PCCM called back for persistent hypotension.  STUDIES:  CT abd/pelvis 8/16 > findings compatible with cholecystitis. Bilateral renal atrophy is noted.  Echo 8/15 > Ejection fraction 40%. Inferior wall akinesis. Moderate mitral regurgitation likely papillary muscle dysfunction from prior inferior infarct. Aortic valve sclerosis. HIDA 8/17 >>> absent gallbladder activity despite morphine augmentation.  Most c/w cystic duct obstruction / acute cholecystitis.  SIGNIFICANT EVENTS: 8/14 admitted for chest pressure 8/16 relative hemodynamic instability in presumed setting of sepsis. Improved with volume. PCCM to see.  8/17 - persistent hypotension despite 7L IVF over past 24 hrs.  Minimal UOP (~500 - 1085mL in past 24 hours).   SUBJECTIVE: Persistent hypotension despite 7L IVF over past 24 hrs.  Minimal UOP (~500 - 1077mL in past 24 hours).  Mental status remains clear and SpO2 95% on 2L O2.  Lungs clear on exam.  He denies chest pain, SOB, N/V/D, worsening abdominal pain. Will transfer to ICU given minimal UOP despite 7L IVF.  VITAL SIGNS: Temp:  [97.6 F (36.4 C)-102.3 F (39.1 C)] 97.6 F (36.4 C) (08/17 0008) Pulse Rate:  [32-102] 77 (08/17 0244) Resp:  [14-35] 21 (08/17 0244) BP: (55-118)/(21-83) 89/50 mmHg (08/17  0244) SpO2:  [87 %-100 %] 92 % (08/17 0244) Weight:  [84.369 kg (186 lb)] 84.369 kg (186 lb) (08/16 0354) HEMODYNAMICS:   VENTILATOR SETTINGS:   INTAKE / OUTPUT:  Intake/Output Summary (Last 24 hours) at 05/11/15 0307 Last data filed at 05/11/15 0131  Gross per 24 hour  Intake 5354.17 ml  Output    750 ml  Net 4604.17 ml   PHYSICAL EXAMINATION: General:  Elderly male in NAD. Neuro:  A&O x 3, MAE's. HEENT:  [place]/AT, no JVD noted, PERRL. Cardiovascular:  RRR, 2/6 SEM. Lungs:  Respirations even and unlabored.  Clear bilaterally. Abdomen:  BS x 4, mild tenderness RUQ, no guarding or rebound. Musculoskeletal:  No acute deformity or ROM limitation. Skin:  Grossly intact.  LABS:  CBC  Recent Labs Lab 05/08/15 0419 05/09/15 0138 05/10/15 0424  WBC 10.5 11.0* 23.5*  HGB 10.3* 10.0* 9.1*  HCT 30.8* 29.4* 27.0*  PLT 309 265 198   Coag's  Recent Labs Lab 05/10/15 0424  INR 1.32   BMET  Recent Labs Lab 05/08/15 0419 05/09/15 0138 05/10/15 0424  NA 138 135 134*  K 4.4 4.5 3.8  CL 104 102 102  CO2 23 24 24   BUN 31* 25* 25*  CREATININE 2.30* 2.05* 2.38*  GLUCOSE 193* 242* 164*   Electrolytes  Recent Labs Lab 05/08/15 0419 05/09/15 0138 05/09/15 1230 05/10/15 0424  CALCIUM 9.3 9.0  --  7.9*  MG  --   --  2.3  --    Sepsis Markers  Recent Labs Lab 05/09/15 1830 05/10/15 0424 05/10/15 1540  LATICACIDVEN 2.2* 1.6 1.5  PROCALCITON 0.58  --   --    ABG  No results for input(s): PHART, PCO2ART, PO2ART in the last 168 hours. Liver Enzymes  Recent Labs Lab 05/10/15 0424  AST 30  ALT 18  ALKPHOS 39  BILITOT 2.0*  ALBUMIN 2.7*   Cardiac Enzymes  Recent Labs Lab 05/09/15 0138 05/10/15 0424 05/10/15 1540  TROPONINI <0.03 1.42* 0.80*   Glucose  Recent Labs Lab 05/09/15 1728 05/09/15 2143 05/10/15 0044 05/10/15 0722 05/10/15 1134 05/10/15 2031  GLUCAP 268* 148* 188* 171* 144* 138*    Imaging Nm Hepatobiliary Liver Func  05/10/2015    CLINICAL DATA:  Right upper quadrant pain. Rule out acute cholecystitis.  EXAM: NUCLEAR MEDICINE HEPATOBILIARY IMAGING  TECHNIQUE: Sequential images of the abdomen were obtained out to 60 minutes following intravenous administration of radiopharmaceutical. At the 90 minute mark, 3.4 mg of morphine were administered.  RADIOPHARMACEUTICALS:  5.4 mCi Tc-32m  Choletec IV  COMPARISON:  CT of 05/09/2015  FINDINGS: Initial images demonstrate prompt uptake of tracer by the liver. Common duct activity is identified at 10 minutes. Bowel activity is identified at 15 minutes. Through 90 minutes, no gallbladder activity is seen. With morphine, subsequent imaging for 30 minutes demonstrates no gallbladder activity.  IMPRESSION: Absent gallbladder activity, despite morphine augmentation. This most consistent with cystic duct obstruction/ acute cholecystitis.  These results will be called to the ordering clinician or representative by the Radiologist Assistant, and communication documented in the PACS or zVision Dashboard.   Electronically Signed   By: Abigail Miyamoto M.D.   On: 05/10/2015 19:19   ASSESSMENT / PLAN:  CARDIOVASCULAR CVL pending 8/17 >>> A:  Persistent hypotension / shock - suspect septic secondary to acute cholecystitis. Elevated troponin - downtrending. Hx CHF (Echo from 05/09/15 with EF of 35-40%), CAD s/p MI x 2, HLD, PVD, Angina. P:  Insert CVL. Norepinephrine PRN to maintain goal MAP > 65. Goal CVP 8 - 12. Repeat troponin, lactate. Cardiology following. Continue heparin gtt. Continue outpatient pravastatin, plavix. Hold outpatient furosemide, imdur, lopressor.  INFECTIOUS A:   Shock - suspect septic secondary to acute cholecystitis. GNR bacteremia? Cultures pending, aerobic bottle only. P:   BCx2 8/15 > GNC aerobic bottle > UCx 8/16 > Abx: Vanc, start date 8/16, day 2/x. Abx: Zosyn, start date 8/16, day 2/x. PCT algorithm to limit abx exposure.  GASTROINTESTINAL A:   Acute  cholecystitis. Nutrition. P:   Surgery following, recommending percutaneous cholecystostomy. Day team to please consult IR (he does not need it emergently at this hour). NPO.  PULMONARY A: Acute hypoxic respiratory failure. P:   Continue supplemental O2 as needed to maintain SpO2 > 92%. Pulmonary hygiene. CXR in AM.  RENAL A:   AoCKD. Minimal UOP - presumed due to septic shock. Pseudohypocalcemia - corrects to 8.94. P:   NS @ 125. Send ionized calcium. BMP in AM.  HEMATOLOGIC A:   Anemia. VTE Prophylaxis. P:  Transfuse for Hgb < 8 given cardiac hx. SCD's / Heparin gtt. CBC in AM.  ENDOCRINE A:   DM.   P:   SSI.  NEUROLOGIC A:   Anxiety. P:   Monitor. Continue outpatient alprazolam.   Family updated: Son updated over phone by RN.  Interdisciplinary Family Meeting v Palliative Care Meeting:  Due by: 05/17/15.   Montey Hora, [place] [facility] Pager: [phone]  or [phone] 05/11/2015, 3:13 AM  STAFF NOTE: I, Merrie Roof, MD FACP have personally reviewed patient's available data, including medical history, events of note, physical  examination and test results as part of my evaluation. I have discussed with resident/NP and other care providers such as pharmacist, RN and RRT. In addition, I personally evaluated patient and elicited key findings of: remains in shock, levophed required, cvp 15, no further bolus needed, EF noted, cuation increased volume, reduce fluids, ghet coprtisol,. No distress, had abdo pain, maintain vanc, zosyn, PCCT 40, levo, will call IR and move up o nlist to NOW if able for perc drain, source control, chem in am , to map 60, unimpressive trop, bleeding risk outwt, dc hep The patient is critically ill with multiple organ systems failure and requires high complexity decision making for assessment and support, frequent evaluation and titration of therapies, application of advanced monitoring  technologies and extensive interpretation of multiple databases.   Critical Care Time devoted to patient care services described in this note is 30 Minutes. This time reflects time of care of this signee: Merrie Roof, MD FACP. This critical care time does not reflect procedure time, or teaching time or supervisory time of PA/NP/Med student/Med Resident etc but could involve care discussion time. Rest per NP/medical resident whose note is outlined above and that I agree with   Lavon Paganini. Titus Mould, MD, [place] Pgr: [place] [facility] 05/11/2015 10:26 AM

## 2015-05-11 NOTE — Progress Notes (Signed)
Pt arrived from 3 WEST via bed with bedside RN and rapid response RN. Pt came with 1 cup of dentures and 1 belonging bag that are at the bedside.

## 2015-05-11 NOTE — Progress Notes (Signed)
Pt son Lateef Talley has been updated & made aware of change in pts level of care and room assignment.

## 2015-05-11 NOTE — Procedures (Signed)
Interventional Radiology Procedure Note  Procedure: Percutaneous cholecystitis.  Complications: None Recommendations:  -  Record output -  cx sent - Routine care   Signed,  Dulcy Fanny. Earleen Newport, DO

## 2015-05-11 NOTE — Care Management Important Message (Signed)
Important Message  Patient Details  Name: Christiaan Jelle MRN: [id number] Date of Birth: [birth year]   Medicare Important Message Given:  Yes-second notification given    Nathen May 05/11/2015, 12:01 [place] Message  Patient Details  Name: Ken Cambray MRN: [id number] Date of Birth: October 25, 1927   Medicare Important Message Given:  Yes-second notification given    Nathen May 05/11/2015, 12:00 PM

## 2015-05-11 NOTE — Progress Notes (Signed)
Shift event note:  Multiple notifications from RN regarding persistent hypotension. BP's have ranged from 55/41-92/55 during the last 8 hours refractory to multiple IVF boluses, (2 L since shift change approx 7 L over last 24 hrs). Pt has remained alert and oriented w/o c/o pain. He has however had diminished UOP, only 500 cc over the last 19 hours. There has been no resp issues, 02 sats have remained WNL and BBS remain somewhat diminished but w/o wheezes or crackles (per RN assessment). PCCM was consulted on previous shift for hypotension but at the time SBP was in the 90's and it was felt appropriate to continue fluid resuscitation. Given h/o EF of 35-40% and lack of response to continued fluid boluses PCCM consulted for further guidance.  Assessment/Plan: 1. Persistent hypotension: In setting of sepsis related to acute cholecystitis. Refractory to multiple IVF boluses. Discussed pt w/ Dr Lake Bells w/ [facility] who agreed to have pt evaluated for likely transfer to ICU for CVL and pressors. Will continue to monitor closely on SDU pending re-evaluation by PCCM.  Jeryl Columbia, NP-C [facility] Pager [phone]
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
## 2015-05-12 DIAGNOSIS — R0789 Other chest pain: Secondary | ICD-10-CM

## 2015-05-12 DIAGNOSIS — K810 Acute cholecystitis: Secondary | ICD-10-CM

## 2015-05-12 LAB — CBC WITH DIFFERENTIAL/PLATELET
BASOS PCT: 0 % (ref 0–1)
Basophils Absolute: 0 10*3/uL (ref 0.0–0.1)
Eosinophils Absolute: 0 10*3/uL (ref 0.0–0.7)
Eosinophils Relative: 0 % (ref 0–5)
HEMATOCRIT: 25.4 % — AB (ref 39.0–52.0)
HEMOGLOBIN: 8.6 g/dL — AB (ref 13.0–17.0)
LYMPHS ABS: 0.8 10*3/uL (ref 0.7–4.0)
LYMPHS PCT: 8 % — AB (ref 12–46)
MCH: 35.5 pg — AB (ref 26.0–34.0)
MCHC: 33.9 g/dL (ref 30.0–36.0)
MCV: 105 fL — AB (ref 78.0–100.0)
MONO ABS: 0.9 10*3/uL (ref 0.1–1.0)
MONOS PCT: 9 % (ref 3–12)
NEUTROS ABS: 8.3 10*3/uL — AB (ref 1.7–7.7)
NEUTROS PCT: 83 % — AB (ref 43–77)
Platelets: 152 10*3/uL (ref 150–400)
RBC: 2.42 MIL/uL — ABNORMAL LOW (ref 4.22–5.81)
RDW: 16.5 % — AB (ref 11.5–15.5)
WBC: 10 10*3/uL (ref 4.0–10.5)

## 2015-05-12 LAB — GLUCOSE, CAPILLARY
GLUCOSE-CAPILLARY: 136 mg/dL — AB (ref 65–99)
GLUCOSE-CAPILLARY: 145 mg/dL — AB (ref 65–99)
GLUCOSE-CAPILLARY: 261 mg/dL — AB (ref 65–99)
Glucose-Capillary: 109 mg/dL — ABNORMAL HIGH (ref 65–99)
Glucose-Capillary: 140 mg/dL — ABNORMAL HIGH (ref 65–99)
Glucose-Capillary: 207 mg/dL — ABNORMAL HIGH (ref 65–99)

## 2015-05-12 LAB — COMPREHENSIVE METABOLIC PANEL
ALBUMIN: 2.3 g/dL — AB (ref 3.5–5.0)
ALK PHOS: 44 U/L (ref 38–126)
ALT: 29 U/L (ref 17–63)
ANION GAP: 7 (ref 5–15)
AST: 33 U/L (ref 15–41)
BUN: 28 mg/dL — ABNORMAL HIGH (ref 6–20)
CHLORIDE: 115 mmol/L — AB (ref 101–111)
CO2: 20 mmol/L — AB (ref 22–32)
Calcium: 7.5 mg/dL — ABNORMAL LOW (ref 8.9–10.3)
Creatinine, Ser: 2.34 mg/dL — ABNORMAL HIGH (ref 0.61–1.24)
GFR calc non Af Amer: 24 mL/min — ABNORMAL LOW (ref >60)
GFR, EST AFRICAN AMERICAN: 28 mL/min — AB (ref >60)
GLUCOSE: 126 mg/dL — AB (ref 65–99)
Potassium: 4.7 mmol/L (ref 3.5–5.1)
SODIUM: 142 mmol/L (ref 135–145)
Total Bilirubin: 0.9 mg/dL (ref 0.3–1.2)
Total Protein: 4.9 g/dL — ABNORMAL LOW (ref 6.5–8.1)

## 2015-05-12 LAB — CULTURE, BLOOD (ROUTINE X 2)

## 2015-05-12 MED ORDER — HALOPERIDOL LACTATE 5 MG/ML IJ SOLN: 10.0000 mg | Freq: Once | INTRAMUSCULAR | Status: DC

## 2015-05-12 MED ORDER — FENTANYL CITRATE (PF) 100 MCG/2ML IJ SOLN: 50.0000 ug | INTRAMUSCULAR | Status: DC | PRN

## 2015-05-12 MED ORDER — INSULIN ASPART 100 UNIT/ML ~~LOC~~ SOLN
0.0000 [IU] | Freq: Every day | SUBCUTANEOUS | Status: DC
  Administered 2015-05-12 – 2015-05-14 (×3): 3 [IU] via SUBCUTANEOUS
  Administered 2015-05-15: 4 [IU] via SUBCUTANEOUS

## 2015-05-12 MED ORDER — INSULIN ASPART 100 UNIT/ML ~~LOC~~ SOLN
0.0000 [IU] | Freq: Three times a day (TID) | SUBCUTANEOUS | Status: DC
  Administered 2015-05-12: 1 [IU] via SUBCUTANEOUS
  Administered 2015-05-12: 3 [IU] via SUBCUTANEOUS
  Administered 2015-05-13 (×2): 7 [IU] via SUBCUTANEOUS
  Administered 2015-05-13: 2 [IU] via SUBCUTANEOUS
  Administered 2015-05-14 (×3): 7 [IU] via SUBCUTANEOUS
  Administered 2015-05-15: 9 [IU] via SUBCUTANEOUS
  Administered 2015-05-15: 5 [IU] via SUBCUTANEOUS
  Administered 2015-05-15: 3 [IU] via SUBCUTANEOUS
  Administered 2015-05-16: 5 [IU] via SUBCUTANEOUS
  Administered 2015-05-16: 7 [IU] via SUBCUTANEOUS

## 2015-05-12 NOTE — Progress Notes (Signed)
Patient ID: Robert [name], male   DOB: Mar 23, 1928, 81 y.o.   MRN: [id number]    Subjective: Pt feels much better today.  No pain.  Perc chole drain has made him feel significantly improved.  Objective: Vital signs in last 24 hours: Temp:  [97.6 F (36.4 C)-98 F (36.7 C)] 97.8 F (36.6 C) (08/18 0807) Pulse Rate:  [29-104] 79 (08/18 0800) Resp:  [15-36] 25 (08/18 0800) BP: (97-153)/(36-140) 129/61 mmHg (08/18 0800) SpO2:  [91 %-100 %] 98 % (08/18 0800) Last BM Date: 05/10/15  Intake/Output from previous day: 08/17 0701 - 08/18 0700 In: 1862.1 [I.V.:1507.1; IV Piggyback:350] Out: [id number] [Urine:1400; Drains:375] Intake/Output this shift:    PE: Abd: soft, essentially NT, except slightly around drain, +BS, ND  Lab Results:   Recent Labs  05/11/15 0530 05/12/15 0420  WBC 14.9* 10.0  HGB 8.3* 8.6*  HCT 25.0* 25.4*  PLT 156 152   BMET  Recent Labs  05/11/15 0530 05/12/15 0420  NA 139 142  K 4.2 4.7  CL 113* 115*  CO2 19* 20*  GLUCOSE 103* 126*  BUN 30* 28*  CREATININE 2.39* 2.34*  CALCIUM 6.9* 7.5*   PT/INR  Recent Labs  05/10/15 0424  LABPROT 16.5*  INR 1.32   CMP     Component Value Date/Time   NA 142 05/12/2015 0420   K 4.7 05/12/2015 0420   CL 115* 05/12/2015 0420   CO2 20* 05/12/2015 0420   GLUCOSE 126* 05/12/2015 0420   BUN 28* 05/12/2015 0420   CREATININE 2.34* 05/12/2015 0420   CREATININE 2.48* 07/27/2014 1032   CALCIUM 7.5* 05/12/2015 0420   PROT 4.9* 05/12/2015 0420   ALBUMIN 2.3* 05/12/2015 0420   AST 33 05/12/2015 0420   ALT 29 05/12/2015 0420   ALKPHOS 44 05/12/2015 0420   BILITOT 0.9 05/12/2015 0420   GFRNONAA 24* 05/12/2015 0420   GFRAA 28* 05/12/2015 0420   Lipase  No results found for: LIPASE     Studies/Results: Nm Hepatobiliary Liver Func  05/10/2015   CLINICAL DATA:  Right upper quadrant pain. Rule out acute cholecystitis.  EXAM: NUCLEAR MEDICINE HEPATOBILIARY IMAGING  TECHNIQUE: Sequential images of the abdomen were  obtained out to 60 minutes following intravenous administration of radiopharmaceutical. At the 90 minute mark, 3.4 mg of morphine were administered.  RADIOPHARMACEUTICALS:  5.4 mCi Tc-57m  Choletec IV  COMPARISON:  CT of 05/09/2015  FINDINGS: Initial images demonstrate prompt uptake of tracer by the liver. Common duct activity is identified at 10 minutes. Bowel activity is identified at 15 minutes. Through 90 minutes, no gallbladder activity is seen. With morphine, subsequent imaging for 30 minutes demonstrates no gallbladder activity.  IMPRESSION: Absent gallbladder activity, despite morphine augmentation. This most consistent with cystic duct obstruction/ acute cholecystitis.  These results will be called to the ordering clinician or representative by the Radiologist Assistant, and communication documented in the PACS or zVision Dashboard.   Electronically Signed   By: Abigail Miyamoto M.D.   On: 05/10/2015 19:19   Ir Perc Cholecystostomy  05/11/2015   INDICATION: Acute cholecystitis  EXAM: ULTRASOUND AND FLUOROSCOPIC-GUIDED CHOLECYSTOSTOMY TUBE PLACEMENT  COMPARISON:  None.  MEDICATIONS: Fentanyl 50 mcg IV; Versed 1.0 mg IV; The patient is currently admitted to the hospital and on intravenous antibiotics. Antibiotics were administered within an appropriate time frame prior to skin puncture.  ANESTHESIA/SEDATION: Total Moderate Sedation Time  Fifteen minutes  CONTRAST:  52mL OMNIPAQUE IOHEXOL 300 MG/ML  SOLN  FLUOROSCOPY TIME:  0 minutes 36 seconds  COMPLICATIONS: None  PROCEDURE: Informed written consent was obtained from the patient after a discussion of the risks, benefits and alternatives to treatment. Questions regarding the procedure were encouraged and answered. A timeout was performed prior to the initiation of the procedure.  The right upper abdominal quadrant was prepped and draped in the usual sterile fashion, and a sterile drape was applied covering the operative field. Maximum barrier sterile technique  with sterile gowns and gloves were used for the procedure. A timeout was performed prior to the initiation of the procedure. Local anesthesia was provided with 1% lidocaine with epinephrine.  Ultrasound scanning of the right upper quadrant demonstrates a markedly dilated gallbladder. Of note, the patient reported pain with ultrasound imaging over the gallbladder. Utilizing a transhepatic approach, a 22 gauge needle was advanced into the gallbladder under direct ultrasound guidance. An ultrasound image was saved for documentation purposes. Appropriate intraluminal puncture was confirmed with the efflux of bile and advancement of an 0.018 wire into the gallbladder lumen. The needle was exchanged for an [place] set. A small amount of contrast was injected to confirm appropriate intraluminal positioning. Over a Benson wire, a 4.2-French Cook cholecystomy tube was advanced into the gallbladder fossa, coiled and locked. Bile was aspirated and a small amount of contrast was injected as several post procedural spot radiographic images were obtained in various obliquities. The catheter was secured to the skin with suture, connected to a drainage bag and a dressing was placed. The patient tolerated the procedure well without immediate post procedural complication.  Sample was sent for analysis.  IMPRESSION: Status post percutaneous cholecystostomy tube placement.  Signed,  Dulcy Fanny. Earleen Newport, DO  [facility]  [facility]   Electronically Signed   By: Corrie Mckusick D.O.   On: 05/11/2015 15:34   Dg Chest Port 1 View  05/11/2015   CLINICAL DATA:  Central line placement.  EXAM: PORTABLE CHEST - 1 VIEW  COMPARISON:  Chest x-ray from 3 days prior  FINDINGS: New left IJ central line, tip at the upper SVC.  No pneumothorax.  Mild cardiomegaly and vascular pedicle widening. Possible trace right pleural effusion. No pulmonary edema.  IMPRESSION: New left IJ catheter without complicating  features.   Electronically Signed   By: Monte Fantasia M.D.   On: 05/11/2015 05:06    Anti-infectives: Anti-infectives    Start     Dose/Rate Route Frequency Ordered Stop   05/11/15 1200  vancomycin (VANCOCIN) IVPB 1000 mg/200 mL premix     1,000 mg 200 mL/hr over 60 Minutes Intravenous Every 24 hours 05/10/15 1224     05/10/15 1330  vancomycin (VANCOCIN) 1,250 mg in sodium chloride 0.9 % 250 mL IVPB     1,250 mg 166.7 mL/hr over 90 Minutes Intravenous  Once 05/10/15 1224 05/10/15 1531   05/10/15 1100  piperacillin-tazobactam (ZOSYN) IVPB 3.375 g     3.375 g 12.5 mL/hr over 240 Minutes Intravenous 3 times per day 05/10/15 0242     05/10/15 0245  piperacillin-tazobactam (ZOSYN) IVPB 3.375 g     3.375 g 100 mL/hr over 30 Minutes Intravenous STAT 05/10/15 0242 05/10/15 0358       Assessment/Plan  1. Acute cholecystitis -patient much improved with drain.  Appreciate IR assistance. -clear liquids, adv as tolerates -ok to resume plavix from our standpoint.  Would need to ask IR if they were ok with it. -cont abx therapy 2. MMP -per primary service   LOS: 4 days    Sarajane Fambrough  E 05/12/2015, 8:45 AM Pager: [phone]

## 2015-05-12 NOTE — Progress Notes (Signed)
Patient Name: Robert [name] Date of Encounter: 05/12/2015    SUBJECTIVE: The patient is doing relatively well. Denies any chest discomfort or dyspnea. His abdomen feels better. He is status post percutaneous gallbladder drain placement.  TELEMETRY:  Normal sinus rhythm. Telemetry reviewed and there is no significant bradycardia noted. Filed Vitals:   05/12/15 0600 05/12/15 0700 05/12/15 0800 05/12/15 0807  BP: 117/59 125/66 129/61   Pulse: 76 73 79   Temp:    97.8 F (36.6 C)  TempSrc:    Oral  Resp: 16 21 25    Height:      Weight:      SpO2: 98% 98% 98%     Intake/Output Summary (Last 24 hours) at 05/12/15 0846 Last data filed at 05/12/15 0622  Gross per 24 hour  Intake 1862.08 ml  Output   1775 ml  Net  87.08 ml   LABS: Basic Metabolic Panel:  Recent Labs  05/09/15 1230  05/11/15 0530 05/12/15 0420  NA  --   < > 139 142  K  --   < > 4.2 4.7  CL  --   < > 113* 115*  CO2  --   < > 19* 20*  GLUCOSE  --   < > 103* 126*  BUN  --   < > 30* 28*  CREATININE  --   < > 2.39* 2.34*  CALCIUM  --   < > 6.9* 7.5*  MG 2.3  --  1.7  --   PHOS  --   --  2.6  --   < > = values in this interval not displayed. CBC:  Recent Labs  05/11/15 0530 05/12/15 0420  WBC 14.9* 10.0  NEUTROABS  --  8.3*  HGB 8.3* 8.6*  HCT 25.0* 25.4*  MCV 108.2* 105.0*  PLT 156 152   Cardiac Enzymes:  Recent Labs  05/10/15 0424 05/10/15 1540 05/11/15 0530  TROPONINI 1.42* 0.80* 0.36*   No new data Radiology/Studies:  No new data  Physical Exam: Blood pressure 129/61, pulse 79, temperature 97.8 F (36.6 C), temperature source Oral, resp. rate 25, height 5\' 8"  (1.727 m), weight 89 kg (196 lb 3.4 oz), SpO2 98 %. Weight change:   Wt Readings from Last 3 Encounters:  05/11/15 89 kg (196 lb 3.4 oz)  05/05/15 81.738 kg (180 lb 3.2 oz)  02/01/15 81.375 kg (179 lb 6.4 oz)   Skin is clear. Mild pallor is noted. No JVD Chest is clear with exception of faint bibasal  crackles Cardiac exam reveals no S4 gallop and apical systolic murmur compatible with MR Extremities reveal no edema Abdomen is mildly distended. Minimal tenderness. Neurological exam reveals decreased hearing  ASSESSMENT:  1. Resolving troponin elevation. Felt to be related to demand ischemia in the setting of known obstructive coronary arteries 2. Transient high-grade AV block, has not recurred since discontinuation of beta blocker therapy (metoprolol) and management of hepatobiliary process. 3. Chronic combined systolic and diastolic heart failure, stable without evidence of volume overload 4. Cholecystitis, sepsis, with resolving toxicity on anti-biotics and now with external gallbladder drain.  Plan:  1. Continue conservative CV management. 2. Avoid medications that block AV node conduction 3. Eventual ischemic evaluation with coronary angiography. If the patient remains clinically stable from the standpoint of angina or other cardiac symptoms. This should probably be done electively as an outpatient after gallbladder issues have been managed and sepsis/infection controlled. 4. Other alternative for cardiac evaluation could be  angiography just prior to going home if all agree.  Robert [name] 05/12/2015, 8:46 AM

## 2015-05-12 NOTE — Progress Notes (Signed)
PULMONARY / CRITICAL CARE MEDICINE   Name: Robert [name] MRN: [id number] DOB: 12/31/1927    ADMISSION DATE:  05/08/2015 CONSULTATION DATE:  05/10/2015  REFERRING MD :  Dr. Tana Coast  CHIEF COMPLAINT:  Chest tightness  INITIAL PRESENTATION:  81 yo male presented with chest pain, and found to have cholecystitis with sepsis.  STUDIES:  8/15 Echo >> EF 40%, mod MR 8/16 CT abd/pelvis >> findings compatible with cholecystitis. Bilateral renal atrophy is noted.  8/17 HIDA >> absent gallbladder activity despite morphine augmentation.  Most c/w cystic duct obstruction / acute cholecystitis.  SIGNIFICANT EVENTS: 8/14 admitted for chest pressure 8/16 PCCM consulted for sepsis 8/17 Off pressors, perc drain by IR for gallbladder  SUBJECTIVE:   VITAL SIGNS: Temp:  [97.6 F (36.4 C)-98 F (36.7 C)] 97.8 F (36.6 C) (08/18 0807) Pulse Rate:  [29-104] 79 (08/18 0800) Resp:  [15-36] 25 (08/18 0800) BP: (97-153)/(36-140) 129/61 mmHg (08/18 0800) SpO2:  [91 %-100 %] 98 % (08/18 0800) HEMODYNAMICS: CVP:  [6 mmHg-15 mmHg] 10 mmHg INTAKE / OUTPUT:  Intake/Output Summary (Last 24 hours) at 05/12/15 1016 Last data filed at 05/12/15 [id number]  Gross per 24 hour  Intake 1862.08 ml  Output   1775 ml  Net  87.08 ml   PHYSICAL EXAMINATION: General:  Elderly male in NAD. Neuro:  A&O x 3, MAE's. HEENT:  Boyd/AT, no JVD noted, PERRL. Cardiovascular:  RRR, 2/6 SEM. Lungs:  Respirations even and unlabored.  Clear bilaterally. Abdomen:  BS x 4, mild tenderness RUQ, no guarding or rebound. Musculoskeletal:  No acute deformity or ROM limitation. Skin:  Grossly intact.  LABS:  CBC  Recent Labs Lab 05/10/15 0424 05/11/15 0530 05/12/15 0420  WBC 23.5* 14.9* 10.0  HGB 9.1* 8.3* 8.6*  HCT 27.0* 25.0* 25.4*  PLT 198 156 152   Coag's  Recent Labs Lab 05/10/15 0424  INR 1.32   BMET  Recent Labs Lab 05/10/15 0424 05/11/15 0530 05/12/15 0420  NA 134* 139 142  K 3.8 4.2 4.7  CL 102 113* 115*   CO2 24 19* 20*  BUN 25* 30* 28*  CREATININE 2.38* 2.39* 2.34*  GLUCOSE 164* 103* 126*   Electrolytes  Recent Labs Lab 05/09/15 1230 05/10/15 0424 05/11/15 0530 05/12/15 0420  CALCIUM  --  7.9* 6.9* 7.5*  MG 2.3  --  1.7  --   PHOS  --   --  2.6  --    Sepsis Markers  Recent Labs Lab 05/09/15 1830 05/10/15 0424 05/10/15 1540 05/11/15 0530  LATICACIDVEN 2.2* 1.6 1.5 0.8  PROCALCITON 0.58  --   --  39.91   Liver Enzymes  Recent Labs Lab 05/10/15 0424 05/12/15 0420  AST 30 33  ALT 18 29  ALKPHOS 39 44  BILITOT 2.0* 0.9  ALBUMIN 2.7* 2.3*   Cardiac Enzymes  Recent Labs Lab 05/10/15 0424 05/10/15 1540 05/11/15 0530  TROPONINI 1.42* 0.80* 0.36*   Glucose  Recent Labs Lab 05/11/15 1223 05/11/15 1546 05/11/15 1941 05/11/15 2358 05/12/15 0405 05/12/15 0803  GLUCAP 121* 153* 134* 136* 109* 145*    Imaging Ir Perc Cholecystostomy  05/11/2015   INDICATION: Acute cholecystitis  EXAM: ULTRASOUND AND FLUOROSCOPIC-GUIDED CHOLECYSTOSTOMY TUBE PLACEMENT  COMPARISON:  None.  MEDICATIONS: Fentanyl 50 mcg IV; Versed 1.0 mg IV; The patient is currently admitted to the hospital and on intravenous antibiotics. Antibiotics were administered within an appropriate time frame prior to skin puncture.  ANESTHESIA/SEDATION: Total Moderate Sedation Time  Fifteen minutes  CONTRAST:  29mL OMNIPAQUE  IOHEXOL 300 MG/ML  SOLN  FLUOROSCOPY TIME:  0 minutes 36 seconds  COMPLICATIONS: None  PROCEDURE: Informed written consent was obtained from the patient after a discussion of the risks, benefits and alternatives to treatment. Questions regarding the procedure were encouraged and answered. A timeout was performed prior to the initiation of the procedure.  The right upper abdominal quadrant was prepped and draped in the usual sterile fashion, and a sterile drape was applied covering the operative field. Maximum barrier sterile technique with sterile gowns and gloves were used for the procedure.  A timeout was performed prior to the initiation of the procedure. Local anesthesia was provided with 1% lidocaine with epinephrine.  Ultrasound scanning of the right upper quadrant demonstrates a markedly dilated gallbladder. Of note, the patient reported pain with ultrasound imaging over the gallbladder. Utilizing a transhepatic approach, a 22 gauge needle was advanced into the gallbladder under direct ultrasound guidance. An ultrasound image was saved for documentation purposes. Appropriate intraluminal puncture was confirmed with the efflux of bile and advancement of an 0.018 wire into the gallbladder lumen. The needle was exchanged for an Norris set. A small amount of contrast was injected to confirm appropriate intraluminal positioning. Over a Benson wire, a 10.2-French Cook cholecystomy tube was advanced into the gallbladder fossa, coiled and locked. Bile was aspirated and a small amount of contrast was injected as several post procedural spot radiographic images were obtained in various obliquities. The catheter was secured to the skin with suture, connected to a drainage bag and a dressing was placed. The patient tolerated the procedure well without immediate post procedural complication.  Sample was sent for analysis.  IMPRESSION: Status post percutaneous cholecystostomy tube placement.  Signed,  Dulcy Fanny. Earleen Newport, DO  [facility]  [facility]   Electronically Signed   By: Corrie Mckusick D.O.   On: 05/11/2015 15:34   ASSESSMENT / PLAN:  CARDIOVASCULAR Lt IJ CVL 8/17 >> 8/18 A:  Septic shock 2nd to cholecystitis. Chronic systolic CHF, CAD, MR, HLD, PVD. P:  Continue outpatient pravastatin Hold outpatient furosemide, imdur, lopressor, plavix for now Cardiology following  INFECTIOUS A:   Acute cholecystitis s/p perc drain by IR 8/17. P:   Day 4 of Abx, currently on zosyn, vancomycin Surgery, IR following Kinevac per surgery F/u  procalcitonin  Blood 8/15 >> GPC >> Urine 8/17 >> Gallbladder 8/17 >>  GASTROINTESTINAL A:   Nutrition. P:   Advance diet per surgery  PULMONARY A: Acute hypoxic respiratory failure 2nd to ATX. P:   Oxygen to keep SpO2 > 92% Bronchial hygiene  RENAL A:   CKD stage 3. P:   Continue IV fluid  HEMATOLOGIC A:   Anemia of critical illness. P:  F/u CBC SQ heparin  ENDOCRINE A:   DM.   Relative adrenal insufficiency. P:   SSI Continue solu cortef for now >> if hemodynamics stable, then start to wean off on 8/19  NEUROLOGIC A:   Anxiety. P:   Continue outpatient alprazolam  Transfer to telemetry 8/18 >> to [facility] 8/19 and PCCM off.  Chesley Mires, MD [facility] Pulmonary/Critical Care 05/12/2015, 10:28 AM Pager:  [phone] After 3pm call: [phone]

## 2015-05-13 DIAGNOSIS — E119 Type 2 diabetes mellitus without complications: Secondary | ICD-10-CM

## 2015-05-13 DIAGNOSIS — I129 Hypertensive chronic kidney disease with stage 1 through stage 4 chronic kidney disease, or unspecified chronic kidney disease: Secondary | ICD-10-CM

## 2015-05-13 DIAGNOSIS — I4729 Other ventricular tachycardia: Secondary | ICD-10-CM

## 2015-05-13 DIAGNOSIS — N184 Chronic kidney disease, stage 4 (severe): Secondary | ICD-10-CM

## 2015-05-13 DIAGNOSIS — I472 Ventricular tachycardia: Secondary | ICD-10-CM

## 2015-05-13 DIAGNOSIS — R1011 Right upper quadrant pain: Secondary | ICD-10-CM

## 2015-05-13 DIAGNOSIS — R0781 Pleurodynia: Secondary | ICD-10-CM

## 2015-05-13 DIAGNOSIS — I442 Atrioventricular block, complete: Secondary | ICD-10-CM

## 2015-05-13 LAB — GLUCOSE, CAPILLARY
GLUCOSE-CAPILLARY: 308 mg/dL — AB (ref 65–99)
GLUCOSE-CAPILLARY: 304 mg/dL — AB (ref 65–99)
GLUCOSE-CAPILLARY: 183 mg/dL — AB (ref 65–99)
GLUCOSE-CAPILLARY: 289 mg/dL — AB (ref 65–99)

## 2015-05-13 LAB — CBC
HCT: 31.7 % — ABNORMAL LOW (ref 39.0–52.0)
HEMOGLOBIN: 10.5 g/dL — AB (ref 13.0–17.0)
MCH: 35 pg — AB (ref 26.0–34.0)
MCHC: 33.1 g/dL (ref 30.0–36.0)
MCV: 105.7 fL — ABNORMAL HIGH (ref 78.0–100.0)
Platelets: 251 10*3/uL (ref 150–400)
RBC: 3 MIL/uL — AB (ref 4.22–5.81)
RDW: 16.6 % — ABNORMAL HIGH (ref 11.5–15.5)
WBC: 8.5 10*3/uL (ref 4.0–10.5)

## 2015-05-13 LAB — BASIC METABOLIC PANEL
ANION GAP: 11 (ref 5–15)
BUN: 30 mg/dL — ABNORMAL HIGH (ref 6–20)
CHLORIDE: 108 mmol/L (ref 101–111)
CO2: 18 mmol/L — ABNORMAL LOW (ref 22–32)
Calcium: 8.1 mg/dL — ABNORMAL LOW (ref 8.9–10.3)
Creatinine, Ser: 2.68 mg/dL — ABNORMAL HIGH (ref 0.61–1.24)
GFR calc non Af Amer: 20 mL/min — ABNORMAL LOW (ref >60)
GFR, EST AFRICAN AMERICAN: 24 mL/min — AB (ref >60)
Glucose, Bld: 350 mg/dL — ABNORMAL HIGH (ref 65–99)
POTASSIUM: 4.6 mmol/L (ref 3.5–5.1)
SODIUM: 137 mmol/L (ref 135–145)

## 2015-05-13 LAB — URINE CULTURE: CULTURE: NO GROWTH

## 2015-05-13 LAB — PROCALCITONIN: PROCALCITONIN: 13 ng/mL

## 2015-05-13 MED ORDER — SODIUM CHLORIDE 0.9 % IV SOLN: 250.0000 mL | INTRAVENOUS | Status: DC | PRN

## 2015-05-13 MED ORDER — ASPIRIN 81 MG PO CHEW
81.0000 mg | CHEWABLE_TABLET | ORAL | Status: AC
  Administered 2015-05-16: 81 mg via ORAL
  Filled 2015-05-13: qty 1

## 2015-05-13 MED ORDER — SODIUM CHLORIDE 0.9 % IJ SOLN: 3.0000 mL | INTRAMUSCULAR | Status: DC | PRN

## 2015-05-13 MED ORDER — PREDNISONE 20 MG PO TABS
60.0000 mg | ORAL_TABLET | Freq: Every day | ORAL | Status: DC
  Administered 2015-05-13 – 2015-05-15 (×3): 60 mg via ORAL
  Filled 2015-05-13 (×3): qty 3

## 2015-05-13 MED ORDER — SODIUM CHLORIDE 0.9 % WEIGHT BASED INFUSION
1.0000 mL/kg/h | INTRAVENOUS | Status: DC
  Administered 2015-05-15 – 2015-05-16 (×2): 1 mL/kg/h via INTRAVENOUS

## 2015-05-13 MED ORDER — SODIUM CHLORIDE 0.9 % IJ SOLN
3.0000 mL | Freq: Two times a day (BID) | INTRAMUSCULAR | Status: DC
  Administered 2015-05-15 – 2015-05-16 (×2): 3 mL via INTRAVENOUS

## 2015-05-13 NOTE — Progress Notes (Signed)
[facility] PROGRESS NOTE   Robert [name] [id number] DOB: 1927/12/25 DOA: 05/08/2015 PCP: Gennette Pac, MD  HPI/Subjective: Feels better, he wants to go home. Tolerated diet okay this morning, had bowel movement yesterday. Walked in the room without assistance.  Assessment/Plan: Principal Problem:   Chest pain Active Problems:   Coronary atherosclerosis of native coronary artery   Mixed hyperlipidemia   Essential hypertension, benign   Diabetes mellitus type 2, controlled   Symptomatic bradycardia   AV block, 2nd degree   Abdominal pain   Acute cholecystitis   Cholecystitis   Right upper quadrant pain   NSVT (nonsustained ventricular tachycardia)   Complete heart block   Sepsis and septic shock Presented with temperature of 102.3, RR of 27 and hypotension. Sepsis secondary to acute cholecystitis. Patient started on aggressive IV fluid hydration and IV antibiotics.   Chest pain Patient admitted initially with chest pain, has had positive troponin. Seen by cardiologist, has intermediate risk stress test. Has had elevated troponins which are thought to be secondary to demand ischemia. Multiple pauses and nonsustained V. tach on telemetry, cardiology decided to do cardiac catheterization on Monday.  Acute cholecystitis Patient is on Zosyn and vancomycin, no neurosurgery for now. Pressure relieved by percutaneous cholecystostomy tube placed on 817. General surgery to see him as outpatient likely to remove the tube in 6 weeks.  He may or he might not need cholecystectomy afterwards.  NSVT Nonsustained ventricular tachycardia, patient has multiple different heart rhythm on the telemetry. Patient also has high grade block, 2 second pause, he is off of beta blockers. Per cardiology needs cardiac catheterization prior to discharge.  Diabetes mellitus type 2 Carbohydrate modified diet Continue acute care with SSI.  Relative adrenal insufficiency As had  cortisol level of 28.6, was on a stress dose of steroids. Solu-Cortef discontinued, started on prednisone taper.  Code Status: Full Code Family Communication: Plan discussed with the patient. Disposition Plan: Remains inpatient Diet: Diet heart healthy/carb modified Room service appropriate?: Yes; Fluid consistency:: Thin  Consultants:  Cardiology   Procedures:  Placement of percutaneous cholecystostomy tube done by Dr. Earleen Newport on 05/11/2015.  Placement of CVL done by Dr. Titus Mould on 05/11/2015  Antibiotics:  Vancomycin and Zosyn   Objective: Filed Vitals:   05/13/15 1320  BP: 143/72  Pulse: 85  Temp: 97.6 F (36.4 C)  Resp: 20    Intake/Output Summary (Last 24 hours) at 05/13/15 1520 Last data filed at 05/13/15 1429  Gross per 24 hour  Intake   1600 ml  Output    393 ml  Net   1207 ml   Filed Weights   05/10/15 0354 05/11/15 0315 05/13/15 0505  Weight: 84.369 kg (186 lb) 89 kg (196 lb 3.4 oz) 87.998 kg (194 lb)    Exam: General: Alert and awake, oriented x3, not in any acute distress. HEENT: anicteric sclera, pupils reactive to light and accommodation, EOMI CVS: S1-S2 clear, no murmur rubs or gallops Chest: clear to auscultation bilaterally, no wheezing, rales or rhonchi Abdomen: soft nontender, nondistended, normal bowel sounds, no organomegaly Extremities: no cyanosis, clubbing or edema noted bilaterally Neuro: Cranial nerves II-XII intact, no focal neurological deficits  Data Reviewed: Basic Metabolic Panel:  Recent Labs Lab 05/09/15 0138 05/09/15 1230 05/10/15 0424 05/11/15 0530 05/12/15 0420 05/13/15 0450  NA 135  --  134* 139 142 137  K 4.5  --  3.8 4.2 4.7 4.6  CL 102  --  102 113* 115* 108  CO2 24  --  24  19* 20* 18*  GLUCOSE 242*  --  164* 103* 126* 350*  BUN 25*  --  25* 30* 28* 30*  CREATININE 2.05*  --  2.38* 2.39* 2.34* 2.68*  CALCIUM 9.0  --  7.9* 6.9* 7.5* 8.1*  MG  --  2.3  --  1.7  --   --   PHOS  --   --   --  2.6  --   --      Liver Function Tests:  Recent Labs Lab 05/10/15 0424 05/12/15 0420  AST 30 33  ALT 18 29  ALKPHOS 39 44  BILITOT 2.0* 0.9  PROT 5.2* 4.9*  ALBUMIN 2.7* 2.3*   No results for input(s): LIPASE, AMYLASE in the last 168 hours. No results for input(s): AMMONIA in the last 168 hours. CBC:  Recent Labs Lab 05/09/15 0138 05/10/15 0424 05/11/15 0530 05/12/15 0420 05/13/15 0450  WBC 11.0* 23.5* 14.9* 10.0 8.5  NEUTROABS  --   --   --  8.3*  --   HGB 10.0* 9.1* 8.3* 8.6* 10.5*  HCT 29.4* 27.0* 25.0* 25.4* 31.7*  MCV 105.4* 107.6* 108.2* 105.0* 105.7*  PLT 265 198 156 152 251   Cardiac Enzymes:  Recent Labs Lab 05/08/15 2009 05/09/15 0138 05/10/15 0424 05/10/15 1540 05/11/15 0530  TROPONINI <0.03 <0.03 1.42* 0.80* 0.36*   BNP (last 3 results) No results for input(s): BNP in the last 8760 hours.  ProBNP (last 3 results) No results for input(s): PROBNP in the last 8760 hours.  CBG:  Recent Labs Lab 05/12/15 1134 05/12/15 1625 05/12/15 2147 05/13/15 0740 05/13/15 1158  GLUCAP 140* 207* 261* 308* 304*    Micro Recent Results (from the past 240 hour(s))  Culture, blood (routine x 2)     Status: None (Preliminary result)   Collection Time: 05/09/15  6:25 PM  Result Value Ref Range Status   Specimen Description BLOOD LEFT HAND  Final   Special Requests BOTTLES DRAWN AEROBIC AND ANAEROBIC 5CC  Final   Culture NO GROWTH 4 DAYS  Final   Report Status PENDING  Incomplete  Culture, blood (routine x 2)     Status: None   Collection Time: 05/09/15  6:30 PM  Result Value Ref Range Status   Specimen Description BLOOD RIGHT HAND  Final   Special Requests IN PEDIATRIC BOTTLE 3CC  Final   Culture  Setup Time   Final    GRAM POSITIVE COCCI IN CHAINS AEROBIC BOTTLE ONLY CRITICAL RESULT CALLED TO, READ BACK BY AND VERIFIED WITH: J LAUER 05/10/15 @ [place]. CORRECTED RESULTS CALLED TO: L Walsworth,RN AT 1142 05/11/15 BY L BENFIELD    Culture   Final     MICROAEROPHILIC STREPTOCOCCI Standardized susceptibility testing for this organism is not available.    Report Status 05/12/2015 FINAL  Final  MRSA PCR Screening     Status: None   Collection Time: 05/11/15  4:23 AM  Result Value Ref Range Status   MRSA by PCR NEGATIVE NEGATIVE Final    Comment:        The GeneXpert MRSA Assay (FDA approved for NASAL specimens only), is one component of a comprehensive MRSA colonization surveillance program. It is not intended to diagnose MRSA infection nor to guide or monitor treatment for MRSA infections.   Culture, routine-abscess     Status: None (Preliminary result)   Collection Time: 05/11/15 12:07 PM  Result Value Ref Range Status   Specimen Description ABSCESS  Final   Special Requests  GALLBLADDER  Final   Gram Stain   Final    ABUNDANT WBC PRESENT,BOTH PMN AND MONONUCLEAR RARE SQUAMOUS EPITHELIAL CELLS PRESENT ABUNDANT GRAM NEGATIVE RODS MODERATE GRAM POSITIVE COCCI IN PAIRS FEW GRAM POSITIVE RODS    Culture   Final    Culture reincubated for better growth Performed at [facility]    Report Status PENDING  Incomplete  Culture, Urine     Status: None   Collection Time: 05/11/15  4:43 PM  Result Value Ref Range Status   Specimen Description URINE, CATHETERIZED  Final   Special Requests NONE  Final   Culture NO GROWTH 2 DAYS  Final   Report Status 05/13/2015 FINAL  Final     Studies: No results found.  Scheduled Meds: . ALPRAZolam  0.25 mg Oral QHS  . heparin subcutaneous  5,000 Units Subcutaneous 3 times per day  . insulin aspart  0-5 Units Subcutaneous QHS  . insulin aspart  0-9 Units Subcutaneous TID WC  . piperacillin-tazobactam (ZOSYN)  IV  3.375 g Intravenous 3 times per day  . pravastatin  80 mg Oral q1800  . predniSONE  60 mg Oral Q breakfast  . sincalide  0.02 mcg/kg Intravenous Once  . vancomycin  1,000 mg Intravenous Q24H   Continuous Infusions:      Time spent: 35 minutes    [facility]  A  [facility] Pager [phone] If 7PM-7AM, please contact night-coverage at [email or web address], password [facility] 05/13/2015, 3:20 PM  LOS: 5 days

## 2015-05-13 NOTE — Progress Notes (Signed)
Inpatient Diabetes Program Recommendations  AACE/ADA: New Consensus Statement on Inpatient Glycemic Control (2013)  Target Ranges:  Prepandial:   less than 140 mg/dL      Peak postprandial:   less than 180 mg/dL (1-2 hours)      Critically ill patients:  140 - 180 mg/dL   Reason for Admission: CP  Diabetes history: DM 2 Outpatient Diabetes medications: Lantus 14 units Daily, Humalog 7 units TID meal coverage Current orders for Inpatient glycemic control: Novolog Sensitive TID + HS scale  Inpatient Diabetes Program Recommendations Insulin - Basal: Glucose 300's this am. Patient on Solucortef. Patient was getting 14 units of Lantus the night of 8/16. Patient was moved to the ICU at that time. Please consider reordering Lantus 14 units starting NOW and then Daily.  Thanks,  Tama Headings RN, MSN, [facility] Inpatient Diabetes Coordinator Team Pager [phone]

## 2015-05-13 NOTE — Discharge Instructions (Signed)
Cholecystostomy  °The gallbladder is a pear-shaped organ that lies beneath the liver on the right side of the body. The gallbladder stores bile, a fluid that helps the body digest fats. However, sometimes bile and other fluids build up in the gallbladder because of an obstruction (for example, a gallstones). This can cause fever, pain, swelling, nausea and other serious symptoms. °The procedure used to drain these fluids is called a cholecystostomy. A tube is inserted into the gallbladder. Fluid drains through the tube into a plastic bag outside the body. This procedure is usually done on people who are admitted to the hospital. °The procedure is often recommended for people who cannot have gallbladder surgery right away, usually because they are too ill to make it through surgery. The cholecystostomy tube is usually temporary, until surgery can be done. °RISKS AND COMPLICATIONS °Although rare, complications can include: °· Clogging of the tube. °· Infection in or around the drain site. Antibiotics might be prescribed for the infection. Or, another tube might be inserted to drain the infected fluid. °· Internal bleeding from the liver. °BEFORE THE PROCEDURE  °· Try to quit smoking several weeks before the procedure. Smoking can slow healing. °· Arrange for someone to drive you home from the hospital. °· Right before your procedure, avoid all foods and liquids after midnight. This includes coffee, tea and water. °· On the day of the procedure, arrive early to fill out all the paperwork. °PROCEDURE °You will be given a sedative to make you sleepy and a local anesthetic to numb the skin. Next, a small cut is made in the abdomen. Then a tube is threaded through the cut into the gallbladder. The procedure is usually done with ultrasound to guide the tube into the gallbladder. Once the tube is in place, the drain is secured to the skin with a stitch. The tube is then connected to a drainage bag.  °AFTER THE PROCEDURE   °· People who have a cholecystostomy usually stay in the hospital for several days because they are so ill. You might not be able to eat for the first few days. Instead, you will be connected to an IV for fluids and nutrients. °· The procedure does not cure the blockage that caused the fluid to build up in the first place. Because of this, the gallbladder will need to be removed in the future. The drain is removed at that time. °HOME CARE INSTRUCTIONS  °Be sure to follow your healthcare provider's instructions carefully. You may shower but avoid tub baths and swimming until your caregiver says it is OK. Eat and drink according to the directions you have been given. And be sure to make all follow-up appointments.  °Call your healthcare provider if you notice new pain, redness or swelling around the wound. °SEEK IMMEDIATE MEDICAL CARE IF:  °· There is increased abdominal pain. °· Nausea or vomiting occurs. °· You develop a fever. °· The drainage tube comes out of the abdomen. °Document Released: 12/07/2008 Document Revised: 12/03/2011 Document Reviewed: 12/07/2008 °ExitCare® Patient Information ©2015 [employer]. This information is not intended to replace advice given to you by your health care provider. Make sure you discuss any questions you have with your health care provider. ° °

## 2015-05-13 NOTE — Progress Notes (Signed)
ANTIBIOTIC CONSULT NOTE  Pharmacy Consult for Vancomycin and Zosyn Indication: cholecystitis/sepsis  No Known Allergies  Patient Measurements: Height: 5\' 8"  (172.7 cm) Weight: 194 lb (87.998 kg) IBW/kg (Calculated) : 68.4    Vital Signs: Temp: 98.1 F (36.7 C) (08/19 0457) Temp Source: Oral (08/19 0457) BP: 112/53 mmHg (08/19 0457) Pulse Rate: 83 (08/19 0457) Intake/Output from previous day: 08/18 0701 - 08/19 0700 In: 1707.5 [P.O.:660; I.V.:775; IV Piggyback:262.5] Out: 501 [Urine:450; Drains:50; Stool:1] Intake/Output from this shift: Total I/O In: 360 [P.O.:360] Out: -   Labs:  Recent Labs  05/11/15 0530 05/12/15 0420 05/13/15 0450  WBC 14.9* 10.0 8.5  HGB 8.3* 8.6* 10.5*  PLT 156 152 251  CREATININE 2.39* 2.34* 2.68*   Estimated Creatinine Clearance: 22.1 mL/min (by C-G formula based on Cr of 2.68). No results for input(s): VANCOTROUGH, VANCOPEAK, VANCORANDOM, GENTTROUGH, GENTPEAK, GENTRANDOM, TOBRATROUGH, TOBRAPEAK, TOBRARND, AMIKACINPEAK, AMIKACINTROU, AMIKACIN in the last 72 hours.   Microbiology: Recent Results (from the past 720 hour(s))  Culture, blood (routine x 2)     Status: None (Preliminary result)   Collection Time: 05/09/15  6:25 PM  Result Value Ref Range Status   Specimen Description BLOOD LEFT HAND  Final   Special Requests BOTTLES DRAWN AEROBIC AND ANAEROBIC 5CC  Final   Culture NO GROWTH 3 DAYS  Final   Report Status PENDING  Incomplete  Culture, blood (routine x 2)     Status: None   Collection Time: 05/09/15  6:30 PM  Result Value Ref Range Status   Specimen Description BLOOD RIGHT HAND  Final   Special Requests IN PEDIATRIC BOTTLE 3CC  Final   Culture  Setup Time   Final    GRAM POSITIVE COCCI IN CHAINS AEROBIC BOTTLE ONLY CRITICAL RESULT CALLED TO, READ BACK BY AND VERIFIED WITH: J LAUER 05/10/15 @ [place]. CORRECTED RESULTS CALLED TO: L Keena,RN AT 1142 05/11/15 BY L BENFIELD    Culture   Final    MICROAEROPHILIC  STREPTOCOCCI Standardized susceptibility testing for this organism is not available.    Report Status 05/12/2015 FINAL  Final  MRSA PCR Screening     Status: None   Collection Time: 05/11/15  4:23 AM  Result Value Ref Range Status   MRSA by PCR NEGATIVE NEGATIVE Final    Comment:        The GeneXpert MRSA Assay (FDA approved for NASAL specimens only), is one component of a comprehensive MRSA colonization surveillance program. It is not intended to diagnose MRSA infection nor to guide or monitor treatment for MRSA infections.   Culture, routine-abscess     Status: None (Preliminary result)   Collection Time: 05/11/15 12:07 PM  Result Value Ref Range Status   Specimen Description ABSCESS  Final   Special Requests GALLBLADDER  Final   Gram Stain   Final    ABUNDANT WBC PRESENT,BOTH PMN AND MONONUCLEAR RARE SQUAMOUS EPITHELIAL CELLS PRESENT ABUNDANT GRAM NEGATIVE RODS MODERATE GRAM POSITIVE COCCI IN PAIRS FEW GRAM POSITIVE RODS    Culture   Final    Culture reincubated for better growth Performed at [facility]    Report Status PENDING  Incomplete  Culture, Urine     Status: None   Collection Time: 05/11/15  4:43 PM  Result Value Ref Range Status   Specimen Description URINE, CATHETERIZED  Final   Special Requests NONE  Final   Culture NO GROWTH 2 DAYS  Final   Report Status 05/13/2015 FINAL  Final  Assessment: 81  y.o. male presented on 8/14 with CP. 8/16 developed fever of 103 and chills.  ID: acute cholecystitis, sepsis; started on empiric zosyn this am, vancomycin added. Tmax 103, Tc 101.8. PCT 0.58. LA 2.2>>1.6. Scr trending up 2.0>>2.3  Pt is afebrile, WBC wnl, sCr bumped to 2.68, PCT 13 from 39.9, LA wnl, BCx reveal micro strep, abscess cultures are still pending with abundant GNRs, moderate GPCs in pairs and few GPRs.  Goal of Therapy:  Vancomycin trough level 15-20 mcg/ml Resolution of infection   Plan:  Vancomycin 1g q24 hours Zosyn 3.375gm IV  q8h Will f/u micro data, renal function, and pt's clinical condition VT if continues due to speciation  Andrey Cota. Diona Foley, PharmD Clinical Pharmacist Pager [phone]  05/13/2015 11:10 AM

## 2015-05-13 NOTE — Progress Notes (Signed)
Patient ID: Robert [name], male   DOB: Dec 29, 1927, 81 y.o.   MRN: [id number]    Subjective: Pt doing great.  Tolerating regular diet.  No pain.  Wants to go home.  Objective: Vital signs in last 24 hours: Temp:  [97.6 F (36.4 C)-98.6 F (37 C)] 98.1 F (36.7 C) (08/19 0457) Pulse Rate:  [51-94] 83 (08/19 0457) Resp:  [18-35] 18 (08/19 0457) BP: (112-162)/(53-87) 112/53 mmHg (08/19 0457) SpO2:  [97 %-100 %] 97 % (08/19 0457) Weight:  [87.998 kg (194 lb)] 87.998 kg (194 lb) (08/19 0505) Last BM Date: 05/12/15  Intake/Output from previous day: 08/18 0701 - 08/19 0700 In: 1707.5 [P.O.:660; I.V.:775; IV Piggyback:262.5] Out: 501 [Urine:450; Drains:50; Stool:1] Intake/Output this shift: Total I/O In: 360 [P.O.:360] Out: -   PE: Abd: soft, NT, ND, +BS, drain with some bilious output  Lab Results:   Recent Labs  05/12/15 0420 05/13/15 0450  WBC 10.0 8.5  HGB 8.6* 10.5*  HCT 25.4* 31.7*  PLT 152 251   BMET  Recent Labs  05/12/15 0420 05/13/15 0450  NA 142 137  K 4.7 4.6  CL 115* 108  CO2 20* 18*  GLUCOSE 126* 350*  BUN 28* 30*  CREATININE 2.34* 2.68*  CALCIUM 7.5* 8.1*   PT/INR No results for input(s): LABPROT, INR in the last 72 hours. CMP     Component Value Date/Time   NA 137 05/13/2015 0450   K 4.6 05/13/2015 0450   CL 108 05/13/2015 0450   CO2 18* 05/13/2015 0450   GLUCOSE 350* 05/13/2015 0450   BUN 30* 05/13/2015 0450   CREATININE 2.68* 05/13/2015 0450   CREATININE 2.48* 07/27/2014 1032   CALCIUM 8.1* 05/13/2015 0450   PROT 4.9* 05/12/2015 0420   ALBUMIN 2.3* 05/12/2015 0420   AST 33 05/12/2015 0420   ALT 29 05/12/2015 0420   ALKPHOS 44 05/12/2015 0420   BILITOT 0.9 05/12/2015 0420   GFRNONAA 20* 05/13/2015 0450   GFRAA 24* 05/13/2015 0450   Lipase  No results found for: LIPASE     Studies/Results: Ir Perc Cholecystostomy  05/11/2015   INDICATION: Acute cholecystitis  EXAM: ULTRASOUND AND FLUOROSCOPIC-GUIDED CHOLECYSTOSTOMY TUBE  PLACEMENT  COMPARISON:  None.  MEDICATIONS: Fentanyl 50 mcg IV; Versed 1.0 mg IV; The patient is currently admitted to the hospital and on intravenous antibiotics. Antibiotics were administered within an appropriate time frame prior to skin puncture.  ANESTHESIA/SEDATION: Total Moderate Sedation Time  Fifteen minutes  CONTRAST:  17mL OMNIPAQUE IOHEXOL 300 MG/ML  SOLN  FLUOROSCOPY TIME:  0 minutes 36 seconds  COMPLICATIONS: None  PROCEDURE: Informed written consent was obtained from the patient after a discussion of the risks, benefits and alternatives to treatment. Questions regarding the procedure were encouraged and answered. A timeout was performed prior to the initiation of the procedure.  The right upper abdominal quadrant was prepped and draped in the usual sterile fashion, and a sterile drape was applied covering the operative field. Maximum barrier sterile technique with sterile gowns and gloves were used for the procedure. A timeout was performed prior to the initiation of the procedure. Local anesthesia was provided with 1% lidocaine with epinephrine.  Ultrasound scanning of the right upper quadrant demonstrates a markedly dilated gallbladder. Of note, the patient reported pain with ultrasound imaging over the gallbladder. Utilizing a transhepatic approach, a 22 gauge needle was advanced into the gallbladder under direct ultrasound guidance. An ultrasound image was saved for documentation purposes. Appropriate intraluminal puncture was confirmed with the efflux of bile  and advancement of an 0.018 wire into the gallbladder lumen. The needle was exchanged for an [place] set. A small amount of contrast was injected to confirm appropriate intraluminal positioning. Over a [place] wire, a 10.2-French Cook cholecystomy tube was advanced into the gallbladder fossa, coiled and locked. Bile was aspirated and a small amount of contrast was injected as several post procedural spot radiographic images were obtained in  various obliquities. The catheter was secured to the skin with suture, connected to a drainage bag and a dressing was placed. The patient tolerated the procedure well without immediate post procedural complication.  Sample was sent for analysis.  IMPRESSION: Status post percutaneous cholecystostomy tube placement.  Signed,  Dulcy Fanny. Earleen Newport, DO  [facility]  [facility]   Electronically Signed   By: Corrie Mckusick D.O.   On: 05/11/2015 15:34    Anti-infectives: Anti-infectives    Start     Dose/Rate Route Frequency Ordered Stop   05/11/15 1200  vancomycin (VANCOCIN) IVPB 1000 mg/200 mL premix     1,000 mg 200 mL/hr over 60 Minutes Intravenous Every 24 hours 05/10/15 1224     05/10/15 1330  vancomycin (VANCOCIN) 1,250 mg in sodium chloride 0.9 % 250 mL IVPB     1,250 mg 166.7 mL/hr over 90 Minutes Intravenous  Once 05/10/15 1224 05/10/15 1531   05/10/15 1100  piperacillin-tazobactam (ZOSYN) IVPB 3.375 g     3.375 g 12.5 mL/hr over 240 Minutes Intravenous 3 times per day 05/10/15 0242     05/10/15 0245  piperacillin-tazobactam (ZOSYN) IVPB 3.375 g     3.375 g 100 mL/hr over 30 Minutes Intravenous STAT 05/10/15 0242 05/10/15 0358       Assessment/Plan   1. Acute cholecystitis -patient doing great. -patient is surgically stable for DC home with his drain in place.  He will need to follow up with Dr. Georgette Dover in 3 weeks for further evaluation.  He will eventually need a drain study and possible cholecystectomy. -would treat with a week total of abx therapy. 2. MMP -per primary service   -we will sign off, follow up and instructions are in the DC section of the patient's chart.  LOS: 5 days    Haitham Dolinsky E 05/13/2015, 9:31 AM Pager: [phone]

## 2015-05-13 NOTE — Progress Notes (Signed)
Patient: Robert [name] / Admit Date: 05/08/2015 / Date of Encounter: 05/13/2015, 10:18 AM   Subjective: Feeling absolutely great. No CP, SOB, or abdominal pain. No orthopnea or LEE.  Objective: Telemetry: NSR, occ PVCs and trigeminy. Rare sinus pause <2 sec. One episode of dropped beat 2/2 Wenckebach. Yesterday AM had 22beat run of MMVT. Physical Exam: Blood pressure 112/53, pulse 83, temperature 98.1 F (36.7 C), temperature source Oral, resp. rate 18, height 5\' 8"  (1.727 m), weight 194 lb (87.998 kg), SpO2 97 %. General: Well developed, well nourished WM, in no acute distress. Head: Normocephalic, atraumatic, sclera non-icteric, no xanthomas, nares are without discharge. Neck: Negative for carotid bruits. JVP not elevated. Lungs: Clear bilaterally to auscultation without wheezes, rales, or rhonchi. Breathing is unlabored. Heart: RRR S1 S2 without murmurs, rubs, or gallops.  Abdomen: Soft, non-tender, non-distended with normoactive bowel sounds. No rebound/guarding. Extremities: No clubbing or cyanosis. No edema. Distal pedal pulses are 2+ and equal bilaterally. Neuro: Alert and oriented X 3. Moves all extremities spontaneously. Psych:  Responds to questions appropriately with a normal affect.   Intake/Output Summary (Last 24 hours) at 05/13/15 1018 Last data filed at 05/13/15 0800  Gross per 24 hour  Intake 1817.5 ml  Output    501 ml  Net 1316.5 ml    Inpatient Medications:  . ALPRAZolam  0.25 mg Oral QHS  . heparin subcutaneous  5,000 Units Subcutaneous 3 times per day  . hydrocortisone sodium succinate  50 mg Intravenous Q6H  . insulin aspart  0-5 Units Subcutaneous QHS  . insulin aspart  0-9 Units Subcutaneous TID WC  . piperacillin-tazobactam (ZOSYN)  IV  3.375 g Intravenous 3 times per day  . pravastatin  80 mg Oral q1800  . sincalide  0.02 mcg/kg Intravenous Once  . vancomycin  1,000 mg Intravenous Q24H   Infusions:  . sodium chloride Stopped (05/12/15 0622)     Labs:  Recent Labs  05/11/15 0530 05/12/15 0420 05/13/15 0450  NA 139 142 137  K 4.2 4.7 4.6  CL 113* 115* 108  CO2 19* 20* 18*  GLUCOSE 103* 126* 350*  BUN 30* 28* 30*  CREATININE 2.39* 2.34* 2.68*  CALCIUM 6.9* 7.5* 8.1*  MG 1.7  --   --   PHOS 2.6  --   --     Recent Labs  05/12/15 0420  AST 33  ALT 29  ALKPHOS 44  BILITOT 0.9  PROT 4.9*  ALBUMIN 2.3*    Recent Labs  05/12/15 0420 05/13/15 0450  WBC 10.0 8.5  NEUTROABS 8.3*  --   HGB 8.6* 10.5*  HCT 25.4* 31.7*  MCV 105.0* 105.7*  PLT 152 251    Recent Labs  05/10/15 1540 05/11/15 0530  TROPONINI 0.80* 0.36*   Invalid input(s): POCBNP No results for input(s): HGBA1C in the last 72 hours.   Radiology/Studies:  Ct Abdomen Pelvis Wo Contrast  05/10/2015   CLINICAL DATA:  Acute onset epigastric and periumbilical pain. Initial encounter.  EXAM: CT ABDOMEN AND PELVIS WITHOUT CONTRAST  TECHNIQUE: Multidetector CT imaging of the abdomen and pelvis was performed following the standard protocol without IV contrast.  COMPARISON:  Abdominal ultrasound performed 08/23/2014, and abdominal radiograph performed 05/08/2015  FINDINGS: The visualized lung bases are clear. A few small metallic densities are seen scattered about the liver. Scattered coronary artery calcifications are seen.  The liver and spleen are unremarkable in appearance. There is mild soft tissue stranding about the gallbladder, with trace pericholecystic fluid. Stones are seen  within the gallbladder. This raises question for mild acute cholecystitis. The pancreas and adrenal glands are unremarkable.  Nonspecific perinephric stranding is noted bilaterally. Mild bilateral renal atrophy is noted. There is no evidence of hydronephrosis. No renal or ureteral stones are identified.  No free fluid is identified. The small bowel is unremarkable in appearance. The stomach is within normal limits. No acute vascular abnormalities are seen. Scattered calcification  is noted along the abdominal aorta and its branches.  The appendix is normal in caliber and contains contrast, without evidence for appendicitis. Contrast passes through the level of the mid transverse colon. The colon is unremarkable in appearance.  The bladder is mildly distended and grossly unremarkable. The prostate is enlarged, measuring 6.4 cm in transverse dimension, with impression on the base of the bladder. An anterior abdominal wall mesh is noted at the lower abdominal wall. No inguinal lymphadenopathy is seen.  No acute osseous abnormalities are identified. Disc space narrowing and vacuum phenomenon are seen at L4-L5.  IMPRESSION: 1. Mild soft tissue stranding about the gallbladder, with trace pericholecystic fluid. Underlying cholelithiasis noted. Findings raise question for mild acute cholecystitis. 2. Scattered coronary artery calcifications seen. 3. Mild bilateral renal atrophy noted. 4. Scattered calcification along the abdominal aorta and its branches. 5. Enlarged prostate noted, with impression on the base of the bladder. Would correlate with PSA, as deemed clinically appropriate. These results were called by telephone at the time of interpretation on 05/10/2015 at 2:05 am to [facility] on [facility] 3W, who verbally acknowledged these results.   Electronically Signed   By: Garald Balding M.D.   On: 05/10/2015 02:06   Dg Chest 2 View  05/08/2015   CLINICAL DATA:  Acute onset of generalized chest pain, shortness of breath and nausea. Initial encounter.  EXAM: CHEST  2 VIEW  COMPARISON:  Chest radiograph from 01/11/2012  FINDINGS: The lungs are well-aerated and clear. There is no evidence of focal opacification, pleural effusion or pneumothorax.  The heart is normal in size; the mediastinal contour is within normal limits. No acute osseous abnormalities are seen. Scattered clips are noted about the right upper quadrant.  IMPRESSION: No acute cardiopulmonary process seen.   Electronically Signed   By:  Garald Balding M.D.   On: 05/08/2015 05:30   Nm Hepatobiliary Liver Func  05/10/2015   CLINICAL DATA:  Right upper quadrant pain. Rule out acute cholecystitis.  EXAM: NUCLEAR MEDICINE HEPATOBILIARY IMAGING  TECHNIQUE: Sequential images of the abdomen were obtained out to 60 minutes following intravenous administration of radiopharmaceutical. At the 90 minute mark, 3.4 mg of morphine were administered.  RADIOPHARMACEUTICALS:  5.4 mCi Tc-52m  Choletec IV  COMPARISON:  CT of 05/09/2015  FINDINGS: Initial images demonstrate prompt uptake of tracer by the liver. Common duct activity is identified at 10 minutes. Bowel activity is identified at 15 minutes. Through 90 minutes, no gallbladder activity is seen. With morphine, subsequent imaging for 30 minutes demonstrates no gallbladder activity.  IMPRESSION: Absent gallbladder activity, despite morphine augmentation. This most consistent with cystic duct obstruction/ acute cholecystitis.  These results will be called to the ordering clinician or representative by the Radiologist Assistant, and communication documented in the PACS or zVision Dashboard.   Electronically Signed   By: Abigail Miyamoto M.D.   On: 05/10/2015 19:19   Nm Myocar Multi W/spect W/wall Motion / Ef  05/08/2015   CLINICAL DATA:  Chest pain and history of coronary artery disease with prior PCI and myocardial infarction.  EXAM: MYOCARDIAL  IMAGING WITH SPECT (REST AND PHARMACOLOGIC-STRESS)  GATED LEFT VENTRICULAR WALL MOTION STUDY  LEFT VENTRICULAR EJECTION FRACTION  TECHNIQUE: Standard myocardial SPECT imaging was performed after resting intravenous injection of 10 mCi Tc-27m sestamibi. Subsequently, intravenous infusion of Lexiscan was performed under the supervision of the Cardiology staff. At peak effect of the drug, 30 mCi Tc-11m sestamibi was injected intravenously and standard myocardial SPECT imaging was performed. Quantitative gated imaging was also performed to evaluate left ventricular wall  motion, and estimate left ventricular ejection fraction.  COMPARISON:  None.  FINDINGS: Perfusion: There is a large fixed perfusion defect involving the inferior and inferolateral walls consistent with scar. Subtle decreased perfusion of the distal anterior and mid to distal anterolateral wall present. This does not register as a significant quantitative reversible defect by soft for analysis. Subtle inducible ischemia in this region cannot be excluded.  Wall Motion: The left ventricle is moderately dilated. Wall motion analysis demonstrates a nearly akinetic inferior wall with some motion present near the apex. The lateral and septal walls are hypokinetic. The anterior wall shows normal motion.  Left Ventricular Ejection Fraction: 36 %  End diastolic volume [id number] ml  End systolic volume [id number] ml  IMPRESSION: 1. Large fixed scar involving the inferior and inferolateral walls consistent with prior infarction. Possible subtle distal anterior and mid to distal anterolateral wall ischemia. As above, this is not a significant quantitative reversible defect but subjectively does appear mildly diminished on the stress acquisition.  2. Akinetic inferior wall. Lateral and septal hypokinesis. Moderately dilated left ventricular cavity.  3. Left ventricular ejection fraction 36%  4. Intermediate-risk stress test findings*.  *2012 Appropriate Use Criteria for Coronary Revascularization Focused Update: J Am Coll Cardiol. [id number]. http://content.airportbarriers.com.aspx?articleid=1201161   Electronically Signed   By: Aletta Edouard M.D.   On: 05/08/2015 13:37   Ir Perc Cholecystostomy  05/11/2015   INDICATION: Acute cholecystitis  EXAM: ULTRASOUND AND FLUOROSCOPIC-GUIDED CHOLECYSTOSTOMY TUBE PLACEMENT  COMPARISON:  None.  MEDICATIONS: Fentanyl 50 mcg IV; Versed 1.0 mg IV; The patient is currently admitted to the hospital and on intravenous antibiotics. Antibiotics were administered within an appropriate time frame  prior to skin puncture.  ANESTHESIA/SEDATION: Total Moderate Sedation Time  Fifteen minutes  CONTRAST:  97mL OMNIPAQUE IOHEXOL 300 MG/ML  SOLN  FLUOROSCOPY TIME:  0 minutes 36 seconds  COMPLICATIONS: None  PROCEDURE: Informed written consent was obtained from the patient after a discussion of the risks, benefits and alternatives to treatment. Questions regarding the procedure were encouraged and answered. A timeout was performed prior to the initiation of the procedure.  The right upper abdominal quadrant was prepped and draped in the usual sterile fashion, and a sterile drape was applied covering the operative field. Maximum barrier sterile technique with sterile gowns and gloves were used for the procedure. A timeout was performed prior to the initiation of the procedure. Local anesthesia was provided with 1% lidocaine with epinephrine.  Ultrasound scanning of the right upper quadrant demonstrates a markedly dilated gallbladder. Of note, the patient reported pain with ultrasound imaging over the gallbladder. Utilizing a transhepatic approach, a 22 gauge needle was advanced into the gallbladder under direct ultrasound guidance. An ultrasound image was saved for documentation purposes. Appropriate intraluminal puncture was confirmed with the efflux of bile and advancement of an 0.018 wire into the gallbladder lumen. The needle was exchanged for an Naomi set. A small amount of contrast was injected to confirm appropriate intraluminal positioning. Over a Benson wire, a 62.2-French Cook cholecystomy tube was advanced  into the gallbladder fossa, coiled and locked. Bile was aspirated and a small amount of contrast was injected as several post procedural spot radiographic images were obtained in various obliquities. The catheter was secured to the skin with suture, connected to a drainage bag and a dressing was placed. The patient tolerated the procedure well without immediate post procedural complication.  Sample was  sent for analysis.  IMPRESSION: Status post percutaneous cholecystostomy tube placement.  Signed,  Dulcy Fanny. Earleen Newport, DO  [facility]  [facility]   Electronically Signed   By: Corrie Mckusick D.O.   On: 05/11/2015 15:34   Dg Chest Port 1 View  05/11/2015   CLINICAL DATA:  Central line placement.  EXAM: PORTABLE CHEST - 1 VIEW  COMPARISON:  Chest x-ray from 3 days prior  FINDINGS: New left IJ central line, tip at the upper SVC.  No pneumothorax.  Mild cardiomegaly and vascular pedicle widening. Possible trace right pleural effusion. No pulmonary edema.  IMPRESSION: New left IJ catheter without complicating features.   Electronically Signed   By: Monte Fantasia M.D.   On: 05/11/2015 05:06   Dg Abd 2 Views  05/08/2015   CLINICAL DATA:  Intermittent periumbilical abdominal pain, diarrhea, history of prostate cancer  EXAM: ABDOMEN - 2 VIEW  COMPARISON:  None.  FINDINGS: Nonobstructive bowel gas pattern.  No evidence of free air under the diaphragm on the upright view.  Lower abdominal/ inguinal hernia mesh repair.  Degenerative changes of the lower lumbar spine.  IMPRESSION: No evidence of small bowel obstruction or free air.   Electronically Signed   By: Julian Hy M.D.   On: 05/08/2015 17:00     Assessment and Plan  81 y/o M with CAD s/p acute posterior wall MI requiring IABP/DES to mCx DES in 2012, ICM EF 35-40%, MR, DM, CKD III-IV admitted with atypical CP and SOB with equivocal nuclear stress test  Also had episode of mildly symptomatic bradycardia 8/16 with Wenckebach -> 2:1 -> CHB for about 2 minutes with HR down to the 30s. Original plan was for cath the next day, however, later that day developed acute shaking chills, fever, and diagnosis of cholecystitis by CT, GPC in chains in blood, and sepsis with hypotension requiring pressors and aggressive IVF. Went on to bump troponin.  1. Acute cholecystitis with septic shock, s/p perc drain 05/11/15 - per  surgery/IM.  2. Chest pain/dyspnea - initially ruled out for MI but equivocal nuc. Original plan was for cath then #1 developed and he bumped troponin to 1.42. Will clarify plan for cath with Dr. Tamala Julian. Per his note yesterday, would probably pursue electively as an outpatient after gallbladder issues have been managed and sepsis/infection controlled as long as he remains stable from cardiac standpoint - will review this plan in light of run of VT yesterday. No longer on BB due to #3. We also need to decide regarding plan for antiplatelet therapy - was on Plavix only as outpatient, taken off ASA in 2013 due to melena.  3.High grade heart block -> 2:1 and CHB - has not recurred since discontinuation of beta blocker therapy (metoprolol) and management of hepatobiliary process. Will review plan with Dr. Tamala Julian in light of #2.  3.  ICM EF 40% (chronic), mod MR (not previously felt to be surgical candidate for MR) - weight up 14lbs from baseline, likely due to aggressive IV fluid rescuscitation. Would not diurese at this time due to AKI - surprisingly appears euvolemic.  Down 2lb from yesterday and he reports brisk UOP today. Will saline lock IV fluids.  4. H/o CAD s/p acute posterior wall MI requiring IABP/DES to mCx DES in 2012 5. Macrocytic anemia, per IM 6. Diabetes mellitus A1C 6.6. 7. AKI on CKD stage III-IV - per IM  Signed, Melina Copa PA-C Pager: [phone]

## 2015-05-14 LAB — COMPREHENSIVE METABOLIC PANEL
ALBUMIN: 2.5 g/dL — AB (ref 3.5–5.0)
ALK PHOS: 42 U/L (ref 38–126)
ALT: 29 U/L (ref 17–63)
ANION GAP: 6 (ref 5–15)
AST: 32 U/L (ref 15–41)
BUN: 36 mg/dL — ABNORMAL HIGH (ref 6–20)
CALCIUM: 8.5 mg/dL — AB (ref 8.9–10.3)
CO2: 22 mmol/L (ref 22–32)
Chloride: 110 mmol/L (ref 101–111)
Creatinine, Ser: 2.55 mg/dL — ABNORMAL HIGH (ref 0.61–1.24)
GFR calc non Af Amer: 22 mL/min — ABNORMAL LOW (ref >60)
GFR, EST AFRICAN AMERICAN: 25 mL/min — AB (ref >60)
GLUCOSE: 330 mg/dL — AB (ref 65–99)
POTASSIUM: 5 mmol/L (ref 3.5–5.1)
SODIUM: 138 mmol/L (ref 135–145)
TOTAL PROTEIN: 5.7 g/dL — AB (ref 6.5–8.1)
Total Bilirubin: 0.9 mg/dL (ref 0.3–1.2)

## 2015-05-14 LAB — GLUCOSE, CAPILLARY
GLUCOSE-CAPILLARY: 312 mg/dL — AB (ref 65–99)
GLUCOSE-CAPILLARY: 311 mg/dL — AB (ref 65–99)
GLUCOSE-CAPILLARY: 265 mg/dL — AB (ref 65–99)
Glucose-Capillary: 319 mg/dL — ABNORMAL HIGH (ref 65–99)

## 2015-05-14 LAB — CULTURE, ROUTINE-ABSCESS

## 2015-05-14 LAB — CULTURE, BLOOD (ROUTINE X 2): Culture: NO GROWTH

## 2015-05-14 LAB — CBC
HEMATOCRIT: 28.5 % — AB (ref 39.0–52.0)
HEMOGLOBIN: 9.6 g/dL — AB (ref 13.0–17.0)
MCH: 35.3 pg — AB (ref 26.0–34.0)
MCHC: 33.7 g/dL (ref 30.0–36.0)
MCV: 104.8 fL — ABNORMAL HIGH (ref 78.0–100.0)
Platelets: 252 10*3/uL (ref 150–400)
RBC: 2.72 MIL/uL — AB (ref 4.22–5.81)
RDW: 16.4 % — ABNORMAL HIGH (ref 11.5–15.5)
WBC: 8.9 10*3/uL (ref 4.0–10.5)

## 2015-05-14 LAB — VANCOMYCIN, TROUGH: VANCOMYCIN TR: 20 ug/mL (ref 10.0–20.0)

## 2015-05-14 MED ORDER — VANCOMYCIN HCL IN DEXTROSE 750-5 MG/150ML-% IV SOLN
750.0000 mg | INTRAVENOUS | Status: DC
  Administered 2015-05-14 – 2015-05-15 (×2): 750 mg via INTRAVENOUS
  Filled 2015-05-14 (×3): qty 150

## 2015-05-14 NOTE — Progress Notes (Signed)
Patient Name: Robert [name] Date of Encounter: 05/14/2015  Principal Problem:   Chest pain Active Problems:   Coronary atherosclerosis of native coronary artery   Mixed hyperlipidemia   Essential hypertension, benign   Diabetes mellitus type 2, controlled   Symptomatic bradycardia   AV block, 2nd degree   Abdominal pain   Acute cholecystitis   Cholecystitis   Right upper quadrant pain   NSVT (nonsustained ventricular tachycardia)   Complete heart block   Length of Stay: 6  SUBJECTIVE  The patient states that he feels best in a long time.  CURRENT MEDS . ALPRAZolam  0.25 mg Oral QHS  . [START ON 05/16/2015] aspirin  81 mg Oral Pre-Cath  . heparin subcutaneous  5,000 Units Subcutaneous 3 times per day  . insulin aspart  0-5 Units Subcutaneous QHS  . insulin aspart  0-9 Units Subcutaneous TID WC  . piperacillin-tazobactam (ZOSYN)  IV  3.375 g Intravenous 3 times per day  . pravastatin  80 mg Oral q1800  . predniSONE  60 mg Oral Q breakfast  . sincalide  0.02 mcg/kg Intravenous Once  . [START ON 05/16/2015] sodium chloride  3 mL Intravenous Q12H  . vancomycin  1,000 mg Intravenous Q24H    OBJECTIVE  Filed Vitals:   05/13/15 0505 05/13/15 1320 05/13/15 2100 05/14/15 0500  BP:  143/72 124/84 104/60  Pulse:  85 78 74  Temp:  97.6 F (36.4 C) 97.8 F (36.6 C) 97.6 F (36.4 C)  TempSrc:  Oral Oral Oral  Resp:  20    Height:      Weight: 194 lb (87.998 kg)   197 lb 9.6 oz (89.631 kg)  SpO2:  100% 95% 98%    Intake/Output Summary (Last 24 hours) at 05/14/15 0935 Last data filed at 05/14/15 0600  Gross per 24 hour  Intake    895 ml  Output   1302 ml  Net   -407 ml   Filed Weights   05/11/15 0315 05/13/15 0505 05/14/15 0500  Weight: 196 lb 3.4 oz (89 kg) 194 lb (87.998 kg) 197 lb 9.6 oz (89.631 kg)    PHYSICAL EXAM  General: Pleasant, NAD. Neuro: Alert and oriented X 3. Moves all extremities spontaneously. Psych: Normal affect. HEENT:  Normal  Neck:  Supple without bruits or JVD. Lungs:  Resp regular and unlabored, CTA. Heart: RRR no s3, s4, or murmurs. Abdomen: Soft, non-tender, non-distended, BS + x 4.  Extremities: No clubbing, cyanosis or edema. DP/PT/Radials 2+ and equal bilaterally.  Accessory Clinical Findings  CBC  Recent Labs  05/12/15 0420 05/13/15 0450 05/14/15 0414  WBC 10.0 8.5 8.9  NEUTROABS 8.3*  --   --   HGB 8.6* 10.5* 9.6*  HCT 25.4* 31.7* 28.5*  MCV 105.0* 105.7* 104.8*  PLT 152 251 [id number]   Basic Metabolic Panel  Recent Labs  05/13/15 0450 05/14/15 0414  NA 137 138  K 4.6 5.0  CL 108 110  CO2 18* 22  GLUCOSE 350* 330*  BUN 30* 36*  CREATININE 2.68* 2.55*  CALCIUM 8.1* 8.5*   Liver Function Tests  Recent Labs  05/12/15 0420 05/14/15 0414  AST 33 32  ALT 29 29  ALKPHOS 44 42  BILITOT 0.9 0.9  PROT 4.9* 5.7*  ALBUMIN 2.3* 2.5*   Radiology/Studies  Nm Myocar Multi W/spect W/wall Motion / Ef  05/08/2015   CLINICAL DATA:  Chest pain and history of coronary artery disease with prior PCI and myocardial infarction.  IMPRESSION: 1. Large fixed scar involving the inferior and inferolateral walls consistent with prior infarction. Possible subtle distal anterior and mid to distal anterolateral wall ischemia. As above, this is not a significant quantitative reversible defect but subjectively does appear mildly diminished on the stress acquisition.  2. Akinetic inferior wall. Lateral and septal hypokinesis. Moderately dilated left ventricular cavity.  3. Left ventricular ejection fraction 36%  4. Intermediate-risk stress test findings*.  *2012 Appropriate Use Criteria for Coronary Revascularization Focused Update: J Am Coll Cardiol. [id number]. http://content.[email or web address].aspx?articleid=1201161   Electronically Signed   By: Aletta Edouard M.D.   On: 05/08/2015 13:37   Dg Chest Port 1 View  05/11/2015   CLINICAL DATA:  Central line placement.  EXAM: PORTABLE CHEST - 1 VIEW  COMPARISON:   Chest x-ray from 3 days prior  FINDINGS: New left IJ central line, tip at the upper SVC.  No pneumothorax.  Mild cardiomegaly and vascular pedicle widening. Possible trace right pleural effusion. No pulmonary edema.  IMPRESSION: New left IJ catheter without complicating features.   Electronically Signed   By: Monte Fantasia M.D.   On: 05/11/2015 05:06   Dg Abd 2 Views  05/08/2015   CLINICAL DATA:  Intermittent periumbilical abdominal pain, diarrhea, history of prostate cancer  EXAM: ABDOMEN - 2 VIEW  COMPARISON:  None.  FINDINGS: Nonobstructive bowel gas pattern.  No evidence of free air under the diaphragm on the upright view.  Lower abdominal/ inguinal hernia mesh repair.  Degenerative changes of the lower lumbar spine.  IMPRESSION: No evidence of small bowel obstruction or free air.   Electronically Signed   By: Julian Hy M.D.   On: 05/08/2015 17:00   TELE: SR    ASSESSMENT AND PLAN    81 y/o M with CAD s/p acute posterior wall MI requiring IABP/DES to mCx DES in 2012, ICM EF 35-40%, MR, DM, CKD III-IV admitted with atypical CP and SOB with equivocal nuclear stress test Also had episode of mildly symptomatic bradycardia 8/16 with Wenckebach -> 2:1 -> CHB for about 2 minutes with HR down to the 30s. Original plan was for cath the next day, however, later that day developed acute shaking chills, fever, and diagnosis of cholecystitis by CT, GPC in chains in blood, and sepsis with hypotension requiring pressors and aggressive IVF. Went on to bump troponin.  1. Acute cholecystitis with septic shock, s/p perc drain 05/11/15 - per surgery/IM.  2. Chest pain/dyspnea - initially ruled out for MI but equivocal nuc. Original plan was for cath then #1 developed and he bumped troponin to 1.42. Will clarify plan for cath with Dr. Tamala Julian. Per his note yesterday, would probably pursue electively as an outpatient after gallbladder issues have been managed and sepsis/infection controlled as long as he  remains stable from cardiac standpoint - had run of VT on Thursday, no more seen overnight.. No longer on BB due to #3. I would recommend to restart Plavix, taken off ASA in 2013 due to melena.  The patient really wishes to go home, if he is discharged we will plan for an outpatient cath and follow up with Dr Tamala Julian, once he is not septic.  3.High grade heart block -> 2:1 and CHB - has not recurred since discontinuation of beta blocker therapy (metoprolol) and management of hepatobiliary process. Will review plan with Dr. Tamala Julian in light of #2.  3. ICM EF 40% (chronic), mod MR (not previously felt to be surgical candidate for MR) - weight up 14lbs from baseline,  likely due to aggressive IV fluid rescuscitation. Would not diurese at this time due to AKI - surprisingly appears euvolemic. Down 2lb from yesterday and he reports brisk UOP today. Will saline lock IV fluids.  4. H/o CAD s/p acute posterior wall MI requiring IABP/DES to mCx DES in 2012 5. Macrocytic anemia, per IM 6. Diabetes mellitus A1C 6.6. 7. AKI on CKD stage III-IV - per IM        Signed, Dorothy Spark MD, [facility] 05/14/2015

## 2015-05-14 NOTE — Plan of Care (Signed)
Problem: Acute Rehab PT Goals(only PT should resolve) Goal: Pt Will Go Up/Down Stairs Without railing

## 2015-05-14 NOTE — Progress Notes (Signed)
[facility] PROGRESS NOTE   Robert [name] [id number] DOB: 1928-04-27 DOA: 05/08/2015 PCP: Gennette Pac, MD  HPI/Subjective: Denies any chest pain RUQ abdominal pain or other significant symptoms. Tolerates foods okay. Plans noted for cardiac cath on Monday  Assessment/Plan: Principal Problem:   Chest pain Active Problems:   Coronary atherosclerosis of native coronary artery   Mixed hyperlipidemia   Essential hypertension, benign   Diabetes mellitus type 2, controlled   Symptomatic bradycardia   AV block, 2nd degree   Abdominal pain   Acute cholecystitis   Cholecystitis   Right upper quadrant pain   NSVT (nonsustained ventricular tachycardia)   Complete heart block   Sepsis and septic shock Presented with temperature of 102.3, RR of 27 and hypotension. Sepsis secondary to acute cholecystitis. Patient started on aggressive IV fluid hydration and IV antibiotics.  Continued antibiotics Zosyn and vancomycin, can probably de-escalate antibiotics in a.m.  Chest pain Patient admitted initially with chest pain, has had positive troponin. Seen by cardiologist, has intermediate risk stress test. Has had elevated troponins which are thought to be secondary to demand ischemia. Multiple pauses and nonsustained V. tach on telemetry, cardiac cath on Monday  Acute cholecystitis Patient is on Zosyn and vancomycin, no neurosurgery for now. Pressure relieved by percutaneous cholecystostomy tube placed on 817. General surgery to see him as outpatient likely to remove the tube in 6 weeks.  He may or he might not need cholecystectomy afterwards.  NSVT Nonsustained ventricular tachycardia, patient has multiple different heart rhythm on the telemetry. Patient also has high grade block, 2 second pause, he is off of beta blockers. Per cardiology needs cardiac catheterization prior to discharge.  Diabetes mellitus type 2 Carbohydrate modified diet Continue acute care with  SSI.  Relative adrenal insufficiency As had cortisol level of 28.6, was on a stress dose of steroids. Solu-Cortef discontinued, started on prednisone taper.  Code Status: Full Code Family Communication: Plan discussed with the patient. Disposition Plan: Remains inpatient Diet: Diet heart healthy/carb modified Room service appropriate?: Yes; Fluid consistency:: Thin Diet NPO time specified  Consultants:  Cardiology   Procedures:  Placement of percutaneous cholecystostomy tube done by Dr. Earleen Newport on 05/11/2015.  Placement of CVL done by Dr. Titus Mould on 05/11/2015  Antibiotics:  Vancomycin and Zosyn   Objective: Filed Vitals:   05/14/15 0500  BP: 104/60  Pulse: 74  Temp: 97.6 F (36.4 C)  Resp:     Intake/Output Summary (Last 24 hours) at 05/14/15 1123 Last data filed at 05/14/15 0600  Gross per 24 hour  Intake    895 ml  Output   1302 ml  Net   -407 ml   Filed Weights   05/11/15 0315 05/13/15 0505 05/14/15 0500  Weight: 89 kg (196 lb 3.4 oz) 87.998 kg (194 lb) 89.631 kg (197 lb 9.6 oz)    Exam: General: Alert and awake, oriented x3, not in any acute distress. HEENT: anicteric sclera, pupils reactive to light and accommodation, EOMI CVS: S1-S2 clear, no murmur rubs or gallops Chest: clear to auscultation bilaterally, no wheezing, rales or rhonchi Abdomen: soft nontender, nondistended, normal bowel sounds, no organomegaly Extremities: no cyanosis, clubbing or edema noted bilaterally Neuro: Cranial nerves II-XII intact, no focal neurological deficits  Data Reviewed: Basic Metabolic Panel:  Recent Labs Lab 05/09/15 1230 05/10/15 0424 05/11/15 0530 05/12/15 0420 05/13/15 0450 05/14/15 0414  NA  --  134* 139 142 137 138  K  --  3.8 4.2 4.7 4.6 5.0  CL  --  102 113* 115* 108 110  CO2  --  24 19* 20* 18* 22  GLUCOSE  --  164* 103* 126* 350* 330*  BUN  --  25* 30* 28* 30* 36*  CREATININE  --  2.38* 2.39* 2.34* 2.68* 2.55*  CALCIUM  --  7.9* 6.9* 7.5*  8.1* 8.5*  MG 2.3  --  1.7  --   --   --   PHOS  --   --  2.6  --   --   --    Liver Function Tests:  Recent Labs Lab 05/10/15 0424 05/12/15 0420 05/14/15 0414  AST 30 33 32  ALT 18 29 29   ALKPHOS 39 44 42  BILITOT 2.0* 0.9 0.9  PROT 5.2* 4.9* 5.7*  ALBUMIN 2.7* 2.3* 2.5*   No results for input(s): LIPASE, AMYLASE in the last 168 hours. No results for input(s): AMMONIA in the last 168 hours. CBC:  Recent Labs Lab 05/10/15 0424 05/11/15 0530 05/12/15 0420 05/13/15 0450 05/14/15 0414  WBC 23.5* 14.9* 10.0 8.5 8.9  NEUTROABS  --   --  8.3*  --   --   HGB 9.1* 8.3* 8.6* 10.5* 9.6*  HCT 27.0* 25.0* 25.4* 31.7* 28.5*  MCV 107.6* 108.2* 105.0* 105.7* 104.8*  PLT 198 156 152 251 252   Cardiac Enzymes:  Recent Labs Lab 05/08/15 2009 05/09/15 0138 05/10/15 0424 05/10/15 1540 05/11/15 0530  TROPONINI <0.03 <0.03 1.42* 0.80* 0.36*   BNP (last 3 results) No results for input(s): BNP in the last 8760 hours.  ProBNP (last 3 results) No results for input(s): PROBNP in the last 8760 hours.  CBG:  Recent Labs Lab 05/13/15 0740 05/13/15 1158 05/13/15 1640 05/13/15 2110 05/14/15 0721  GLUCAP 308* 304* 183* 289* 312*    Micro Recent Results (from the past 240 hour(s))  Culture, blood (routine x 2)     Status: None (Preliminary result)   Collection Time: 05/09/15  6:25 PM  Result Value Ref Range Status   Specimen Description BLOOD LEFT HAND  Final   Special Requests BOTTLES DRAWN AEROBIC AND ANAEROBIC 5CC  Final   Culture NO GROWTH 4 DAYS  Final   Report Status PENDING  Incomplete  Culture, blood (routine x 2)     Status: None   Collection Time: 05/09/15  6:30 PM  Result Value Ref Range Status   Specimen Description BLOOD RIGHT HAND  Final   Special Requests IN PEDIATRIC BOTTLE 3CC  Final   Culture  Setup Time   Final    GRAM POSITIVE COCCI IN CHAINS AEROBIC BOTTLE ONLY CRITICAL RESULT CALLED TO, READ BACK BY AND VERIFIED WITH: J LAUER 05/10/15 @ [place]. CORRECTED RESULTS CALLED TO: L Latner,RN AT 1142 05/11/15 BY L BENFIELD    Culture   Final    MICROAEROPHILIC STREPTOCOCCI Standardized susceptibility testing for this organism is not available.    Report Status 05/12/2015 FINAL  Final  MRSA PCR Screening     Status: None   Collection Time: 05/11/15  4:23 AM  Result Value Ref Range Status   MRSA by PCR NEGATIVE NEGATIVE Final    Comment:        The GeneXpert MRSA Assay (FDA approved for NASAL specimens only), is one component of a comprehensive MRSA colonization surveillance program. It is not intended to diagnose MRSA infection nor to guide or monitor treatment for MRSA infections.   Culture, routine-abscess     Status: None   Collection Time: 05/11/15 12:07 PM  Result Value Ref Range Status   Specimen Description ABSCESS  Final   Special Requests GALLBLADDER  Final   Gram Stain   Final    ABUNDANT WBC PRESENT,BOTH PMN AND MONONUCLEAR RARE SQUAMOUS EPITHELIAL CELLS PRESENT ABUNDANT GRAM NEGATIVE RODS MODERATE GRAM POSITIVE COCCI IN PAIRS FEW GRAM POSITIVE RODS    Culture   Final    MULTIPLE ORGANISMS PRESENT, NONE PREDOMINANT Note: NO STAPHYLOCOCCUS AUREUS ISOLATED NO GROUP A STREP (S.PYOGENES) ISOLATED Performed at [facility]    Report Status 05/14/2015 FINAL  Final  Culture, Urine     Status: None   Collection Time: 05/11/15  4:43 PM  Result Value Ref Range Status   Specimen Description URINE, CATHETERIZED  Final   Special Requests NONE  Final   Culture NO GROWTH 2 DAYS  Final   Report Status 05/13/2015 FINAL  Final     Studies: No results found.  Scheduled Meds: . ALPRAZolam  0.25 mg Oral QHS  . [START ON 05/16/2015] aspirin  81 mg Oral Pre-Cath  . heparin subcutaneous  5,000 Units Subcutaneous 3 times per day  . insulin aspart  0-5 Units Subcutaneous QHS  . insulin aspart  0-9 Units Subcutaneous TID WC  . piperacillin-tazobactam (ZOSYN)  IV  3.375 g Intravenous 3 times per day  .  pravastatin  80 mg Oral q1800  . predniSONE  60 mg Oral Q breakfast  . sincalide  0.02 mcg/kg Intravenous Once  . [START ON 05/16/2015] sodium chloride  3 mL Intravenous Q12H  . vancomycin  1,000 mg Intravenous Q24H   Continuous Infusions: . [START ON 05/15/2015] sodium chloride         Time spent: 35 minutes    [facility]  [facility] Pager [phone] If 7PM-7AM, please contact night-coverage at [email or web address], password [email or web address] 05/14/2015, 11:23 AM  LOS: 6 days

## 2015-05-14 NOTE — Progress Notes (Signed)
ANTIBIOTIC CONSULT NOTE  Pharmacy Consult for Vancomycin and Zosyn Indication: cholecystitis/sepsis  No Known Allergies  Patient Measurements: Height: 5\' 8"  (172.7 cm) Weight: 197 lb 9.6 oz (89.631 kg) IBW/kg (Calculated) : 68.4    Vital Signs: Temp: 97.6 F (36.4 C) (08/20 0500) Temp Source: Oral (08/20 0500) BP: 104/60 mmHg (08/20 0500) Pulse Rate: 74 (08/20 0500) Intake/Output from previous day: 08/19 0701 - 08/20 0700 In: 1255 [P.O.:1140; IV Piggyback:100] Out: 1302 [Urine:1175; Drains:127] Intake/Output from this shift: Total I/O In: 5 [Other:5] Out: -   Labs:  Recent Labs  05/12/15 0420 05/13/15 0450 05/14/15 0414  WBC 10.0 8.5 8.9  HGB 8.6* 10.5* 9.6*  PLT 152 251 252  CREATININE 2.34* 2.68* 2.55*   Estimated Creatinine Clearance: 23.5 mL/min (by C-G formula based on Cr of 2.55).  Recent Labs  05/14/15 1230  [place] 20     Microbiology: Recent Results (from the past 720 hour(s))  Culture, blood (routine x 2)     Status: None (Preliminary result)   Collection Time: 05/09/15  6:25 PM  Result Value Ref Range Status   Specimen Description BLOOD LEFT HAND  Final   Special Requests BOTTLES DRAWN AEROBIC AND ANAEROBIC 5CC  Final   Culture NO GROWTH 4 DAYS  Final   Report Status PENDING  Incomplete  Culture, blood (routine x 2)     Status: None   Collection Time: 05/09/15  6:30 PM  Result Value Ref Range Status   Specimen Description BLOOD RIGHT HAND  Final   Special Requests IN PEDIATRIC BOTTLE 3CC  Final   Culture  Setup Time   Final    GRAM POSITIVE COCCI IN CHAINS AEROBIC BOTTLE ONLY CRITICAL RESULT CALLED TO, READ BACK BY AND VERIFIED WITH: J LAUER 05/10/15 @ [place]. CORRECTED RESULTS CALLED TO: L Archibald,RN AT 1142 05/11/15 BY L BENFIELD    Culture   Final    MICROAEROPHILIC STREPTOCOCCI Standardized susceptibility testing for this organism is not available.    Report Status 05/12/2015 FINAL  Final  MRSA PCR Screening     Status:  None   Collection Time: 05/11/15  4:23 AM  Result Value Ref Range Status   MRSA by PCR NEGATIVE NEGATIVE Final    Comment:        The GeneXpert MRSA Assay (FDA approved for NASAL specimens only), is one component of a comprehensive MRSA colonization surveillance program. It is not intended to diagnose MRSA infection nor to guide or monitor treatment for MRSA infections.   Culture, routine-abscess     Status: None   Collection Time: 05/11/15 12:07 PM  Result Value Ref Range Status   Specimen Description ABSCESS  Final   Special Requests GALLBLADDER  Final   Gram Stain   Final    ABUNDANT WBC PRESENT,BOTH PMN AND MONONUCLEAR RARE SQUAMOUS EPITHELIAL CELLS PRESENT ABUNDANT GRAM NEGATIVE RODS MODERATE GRAM POSITIVE COCCI IN PAIRS FEW GRAM POSITIVE RODS    Culture   Final    MULTIPLE ORGANISMS PRESENT, NONE PREDOMINANT Note: NO STAPHYLOCOCCUS AUREUS ISOLATED NO GROUP A STREP (S.PYOGENES) ISOLATED Performed at [facility]    Report Status 05/14/2015 FINAL  Final  Culture, Urine     Status: None   Collection Time: 05/11/15  4:43 PM  Result Value Ref Range Status   Specimen Description URINE, CATHETERIZED  Final   Special Requests NONE  Final   Culture NO GROWTH 2 DAYS  Final   Report Status 05/13/2015 FINAL  Final  Assessment: 81  y.o. male presented on 8/14 with CP. 8/16 developed fever of 103 and chills.  ID: acute cholecystitis, sepsis; started on empiric zosyn this am, vancomycin added. Tmax 103, Tc 101.8. PCT 0.58. LA 2.2>>1.6. Scr trending down overnight to 2.55.   Pt is afebrile, WBC wnl, PCT 13 from 39.9, LA wnl, BCx reveal micro strep, abscess cultures are still pending with abundant GNRs, moderate GPCs in pairs and few GPRs.  Vancomycin trough resulted at 20 drawn ~1 hour late. Will reduce dose, note MD plans to de-escalate in am.  Goal of Therapy:  Vancomycin trough level 15-20 mcg/ml Resolution of infection   Plan:  Vancomycin 750mg  q24 hours Zosyn  3.375gm IV q8h Will f/u micro data, renal function, and pt's clinical condition  Erin Hearing PharmD., BCPS Clinical Pharmacist Pager [phone] 05/14/2015 1:42 PM

## 2015-05-14 NOTE — Evaluation (Signed)
Physical Therapy Evaluation Patient Details Name: Robert [name] MRN: [id number] DOB: 1928/04/02 Today's Date: 05/14/2015   History of Present Illness  81 yo male with onset of chest pain with hx STEMI was admitted and due to cholecystitis has percutaneous drain placed.  L heart cath and other testing indicates no acute events but is recommended for follow up with cardiology for stress test  after sepsis from cholecystitis is resolved.  Clinical Impression  Robert was seen for evaluation of gait and noted lateral instability but corrected somewhat with IV pole.  Robert did reach to railing at times from IV pole during more confined use of the device.  Has been reluctant to try walker or cane, and is insisting PLOF means he doesn't need any help.  Reminded Robert he decides to act on Robert recommendations, and will try to use AD if needed at next session.    Follow Up Recommendations Home health Robert;Supervision/Assistance - 24 hour    Equipment Recommendations  Other (comment) (need to try walker and SPC)    Recommendations for Other Services Rehab consult     Precautions / Restrictions Precautions Precautions: Fall (telemetry) Restrictions Weight Bearing Restrictions: No      Mobility  Bed Mobility Overal bed mobility: Modified Independent             General bed mobility comments: using elevated HOB and bed rail  Transfers Overall transfer level: Modified independent Equipment used:  (IV pole to steady himself)             General transfer comment: wide base and using momemtum to stand  Ambulation/Gait Ambulation/Gait assistance: Min guard Ambulation Distance (Feet): 200 Feet Assistive device: 1 person hand held assist (IV pole) Gait Pattern/deviations: Decreased stride length;Step-through pattern;Wide base of support;Trunk flexed;Drifts right/left (HHA laterally unsteady) Gait velocity: reduced Gait velocity interpretation: Below normal speed for age/gender General Gait  Details: Robert was depending on IV pole for help, so Robert took his R hand and observed instability. Robert became upset when Robert asked to use a RW for comparison to IV pole, insisting he was not in need of help.  Son agreed with him that he was doing well before admission and would not need an AD.  Despite Robert asking to just attempt AD, both refused and Robert is not committed to [place] either.  Reports he has a family member who is a Robert and "he can decide".    Stairs            Wheelchair Mobility    Modified Rankin (Stroke Patients Only)       Balance Overall balance assessment: Needs assistance Sitting-balance support: Feet supported Sitting balance-Leahy Scale: Good     Standing balance support: Single extremity supported Standing balance-Leahy Scale: Poor                               Pertinent Vitals/Pain Pain Assessment: No/denies pain    Home Living Family/patient expects to be discharged to:: Private residence Living Arrangements: Children Available Help at Discharge: Family Type of Home: House Home Access: Stairs to enter Entrance Stairs-Rails: None Entrance Stairs-Number of Steps: 1 Home Layout: One level Home Equipment: None Additional Comments: Robert has been out mowing lawn and is not comfortable with Robert recommending him to have HHPT    Prior Function Level of Independence: Independent               Hand Dominance  Extremity/Trunk Assessment   Upper Extremity Assessment: Overall WFL for tasks assessed           Lower Extremity Assessment: LLE deficits/detail (has 4- L knee flexion and 4+ ext, DF 4+ )      Cervical / Trunk Assessment: Normal  Communication   Communication: HOH  Cognition Arousal/Alertness: Awake/alert Behavior During Therapy: WFL for tasks assessed/performed Overall Cognitive Status: Within Functional Limits for tasks assessed                      General Comments General comments (skin integrity, edema,  etc.): Robert will be seen for ongoing therapy but expect him to refuse HHPT and have not attempted AD to see how this looks for him.  Even on IV pole Robert was occasionally reaching for wall rail on hall.    Exercises        Assessment/Plan    Robert Assessment Patient needs continued Robert services  Robert Diagnosis Abnormality of gait   Robert Problem List Decreased strength;Decreased range of motion;Decreased activity tolerance;Decreased balance;Decreased mobility;Decreased coordination;Decreased knowledge of use of DME;Decreased safety awareness;Cardiopulmonary status limiting activity;Decreased skin integrity;Other (comment) (percutaneous drain)  Robert Treatment Interventions DME instruction;Gait training;Stair training;Functional mobility training;Therapeutic activities;Therapeutic exercise;Balance training;Neuromuscular re-education;Patient/family education   Robert Goals (Current goals can be found in the Care Plan section) Acute Rehab Robert Goals Patient Stated Goal: to go home soon Robert Goal Formulation: With patient/family Time For Goal Achievement: 05/28/15 Potential to Achieve Goals: Good    Frequency Min 3X/week   Barriers to discharge Other (comment) (Needs AD for stability) declining to try walker or SPC    Co-evaluation               End of Session   Activity Tolerance: Patient tolerated treatment well;Patient limited by fatigue Patient left: in bed;with call bell/phone within reach;with family/visitor present Nurse Communication: Mobility status;Precautions         Time: Robert [name] Robert Time Calculation (min) (ACUTE ONLY): 28 min   Charges:   Robert Evaluation $Initial Robert Evaluation Tier I: 1 Procedure Robert Treatments $Gait Training: 8-22 mins   Robert G CodesRamond [name] 2015-05-19, 1:02 PM   Robert [name], Robert [name] Acute Rehab Dept. Number: [facility] [id number] and [place] [phone]

## 2015-05-15 DIAGNOSIS — E782 Mixed hyperlipidemia: Secondary | ICD-10-CM

## 2015-05-15 DIAGNOSIS — I442 Atrioventricular block, complete: Secondary | ICD-10-CM

## 2015-05-15 LAB — GLUCOSE, CAPILLARY
GLUCOSE-CAPILLARY: 278 mg/dL — AB (ref 65–99)
Glucose-Capillary: 254 mg/dL — ABNORMAL HIGH (ref 65–99)
Glucose-Capillary: 373 mg/dL — ABNORMAL HIGH (ref 65–99)
Glucose-Capillary: 322 mg/dL — ABNORMAL HIGH (ref 65–99)

## 2015-05-15 MED ORDER — PREDNISONE 20 MG PO TABS
40.0000 mg | ORAL_TABLET | Freq: Every day | ORAL | Status: DC
  Administered 2015-05-16: 40 mg via ORAL
  Filled 2015-05-15: qty 2

## 2015-05-15 NOTE — Progress Notes (Signed)
[facility] PROGRESS NOTE   Robert [name] [id number] DOB: Feb 10, 1928 DOA: 05/08/2015 PCP: Gennette Pac, MD  HPI/Subjective: No significant complaints, no clinical changes since yesterday. For cath tomorrow morning, continue to taper down steroids. Precath hydration duration protocol per cardiology.  Assessment/Plan: Principal Problem:   Chest pain Active Problems:   Coronary atherosclerosis of native coronary artery   Mixed hyperlipidemia   Essential hypertension, benign   Diabetes mellitus type 2, controlled   Symptomatic bradycardia   AV block, 2nd degree   Abdominal pain   Acute cholecystitis   Cholecystitis   Right upper quadrant pain   NSVT (nonsustained ventricular tachycardia)   Complete heart block   Sepsis and septic shock Presented with temperature of 102.3, RR of 27 and hypotension. Sepsis secondary to acute cholecystitis. Patient started on aggressive IV fluid hydration and IV antibiotics.  Continued antibiotics Zosyn and vancomycin, will switch to oral antibiotics in a.m. after cath.  Chest pain Patient admitted initially with chest pain, has had positive troponin. Seen by cardiologist, has intermediate risk stress test. Has had elevated troponins which are thought to be secondary to demand ischemia. Multiple pauses and nonsustained V. tach on telemetry, cardiac cath on Monday  Acute cholecystitis Patient is on Zosyn and vancomycin, no neurosurgery for now. Pressure relieved by percutaneous cholecystostomy tube placed on 817. General surgery to see him as outpatient likely to remove the tube in 6 weeks.  He may or he might not need cholecystectomy afterwards.  NSVT Nonsustained ventricular tachycardia, patient has multiple different heart rhythm on the telemetry. Patient also has high grade block, 2 second pause, he is off of beta blockers. Per cardiology needs cardiac catheterization prior to discharge.  Diabetes mellitus type  2 Carbohydrate modified diet Continue acute care with SSI.  Relative adrenal insufficiency As had cortisol level of 28.6, was on a stress dose of steroids. Solu-Cortef discontinued, started on prednisone taper. Blood pressure is holding up, decrease the prednisone further.  Code Status: Full Code Family Communication: Plan discussed with the patient. Disposition Plan: Remains inpatient Diet: Diet heart healthy/carb modified Room service appropriate?: Yes; Fluid consistency:: Thin Diet NPO time specified  Consultants:  Cardiology   Procedures:  Placement of percutaneous cholecystostomy tube done by Dr. Earleen Newport on 05/11/2015.  Placement of CVL done by Dr. Titus Mould on 05/11/2015  Antibiotics:  Vancomycin and Zosyn   Objective: Filed Vitals:   05/15/15 0500  BP: 108/63  Pulse: 74  Temp: 97.5 F (36.4 C)  Resp:     Intake/Output Summary (Last 24 hours) at 05/15/15 1310 Last data filed at 05/15/15 1046  Gross per 24 hour  Intake    185 ml  Output   1490 ml  Net  -1305 ml   Filed Weights   05/13/15 0505 05/14/15 0500 05/15/15 0500  Weight: 87.998 kg (194 lb) 89.631 kg (197 lb 9.6 oz) 89.495 kg (197 lb 4.8 oz)    Exam: General: Alert and awake, oriented x3, not in any acute distress. HEENT: anicteric sclera, pupils reactive to light and accommodation, EOMI CVS: S1-S2 clear, no murmur rubs or gallops Chest: clear to auscultation bilaterally, no wheezing, rales or rhonchi Abdomen: soft nontender, nondistended, normal bowel sounds, no organomegaly Extremities: no cyanosis, clubbing or edema noted bilaterally Neuro: Cranial nerves II-XII intact, no focal neurological deficits  Data Reviewed: Basic Metabolic Panel:  Recent Labs Lab 05/09/15 1230 05/10/15 0424 05/11/15 0530 05/12/15 0420 05/13/15 0450 05/14/15 0414  NA  --  134* 139 142 137 138  K  --  3.8 4.2 4.7 4.6 5.0  CL  --  102 113* 115* 108 110  CO2  --  24 19* 20* 18* 22  GLUCOSE  --  164* 103*  126* 350* 330*  BUN  --  25* 30* 28* 30* 36*  CREATININE  --  2.38* 2.39* 2.34* 2.68* 2.55*  CALCIUM  --  7.9* 6.9* 7.5* 8.1* 8.5*  MG 2.3  --  1.7  --   --   --   PHOS  --   --  2.6  --   --   --    Liver Function Tests:  Recent Labs Lab 05/10/15 0424 05/12/15 0420 05/14/15 0414  AST 30 33 32  ALT 18 29 29   ALKPHOS 39 44 42  BILITOT 2.0* 0.9 0.9  PROT 5.2* 4.9* 5.7*  ALBUMIN 2.7* 2.3* 2.5*   No results for input(s): LIPASE, AMYLASE in the last 168 hours. No results for input(s): AMMONIA in the last 168 hours. CBC:  Recent Labs Lab 05/10/15 0424 05/11/15 0530 05/12/15 0420 05/13/15 0450 05/14/15 0414  WBC 23.5* 14.9* 10.0 8.5 8.9  NEUTROABS  --   --  8.3*  --   --   HGB 9.1* 8.3* 8.6* 10.5* 9.6*  HCT 27.0* 25.0* 25.4* 31.7* 28.5*  MCV 107.6* 108.2* 105.0* 105.7* 104.8*  PLT 198 156 152 251 252   Cardiac Enzymes:  Recent Labs Lab 05/08/15 2009 05/09/15 0138 05/10/15 0424 05/10/15 1540 05/11/15 0530  TROPONINI <0.03 <0.03 1.42* 0.80* 0.36*   BNP (last 3 results) No results for input(s): BNP in the last 8760 hours.  ProBNP (last 3 results) No results for input(s): PROBNP in the last 8760 hours.  CBG:  Recent Labs Lab 05/14/15 1128 05/14/15 1631 05/14/15 2042 05/15/15 0736 05/15/15 1135  GLUCAP 311* 319* 265* 254* 278*    Micro Recent Results (from the past 240 hour(s))  Culture, blood (routine x 2)     Status: None   Collection Time: 05/09/15  6:25 PM  Result Value Ref Range Status   Specimen Description BLOOD LEFT HAND  Final   Special Requests BOTTLES DRAWN AEROBIC AND ANAEROBIC 5CC  Final   Culture NO GROWTH 5 DAYS  Final   Report Status 05/14/2015 FINAL  Final  Culture, blood (routine x 2)     Status: None   Collection Time: 05/09/15  6:30 PM  Result Value Ref Range Status   Specimen Description BLOOD RIGHT HAND  Final   Special Requests IN PEDIATRIC BOTTLE 3CC  Final   Culture  Setup Time   Final    GRAM POSITIVE COCCI IN  CHAINS AEROBIC BOTTLE ONLY CRITICAL RESULT CALLED TO, READ BACK BY AND VERIFIED WITH: J LAUER 05/10/15 @ [place]. CORRECTED RESULTS CALLED TO: L Morea,RN AT 1142 05/11/15 BY L BENFIELD    Culture   Final    MICROAEROPHILIC STREPTOCOCCI Standardized susceptibility testing for this organism is not available.    Report Status 05/12/2015 FINAL  Final  MRSA PCR Screening     Status: None   Collection Time: 05/11/15  4:23 AM  Result Value Ref Range Status   MRSA by PCR NEGATIVE NEGATIVE Final    Comment:        The GeneXpert MRSA Assay (FDA approved for NASAL specimens only), is one component of a comprehensive MRSA colonization surveillance program. It is not intended to diagnose MRSA infection nor to guide or monitor treatment for MRSA infections.   Culture, routine-abscess  Status: None   Collection Time: 05/11/15 12:07 PM  Result Value Ref Range Status   Specimen Description ABSCESS  Final   Special Requests GALLBLADDER  Final   Gram Stain   Final    ABUNDANT WBC PRESENT,BOTH PMN AND MONONUCLEAR RARE SQUAMOUS EPITHELIAL CELLS PRESENT ABUNDANT GRAM NEGATIVE RODS MODERATE GRAM POSITIVE COCCI IN PAIRS FEW GRAM POSITIVE RODS    Culture   Final    MULTIPLE ORGANISMS PRESENT, NONE PREDOMINANT Note: NO STAPHYLOCOCCUS AUREUS ISOLATED NO GROUP A STREP (S.PYOGENES) ISOLATED Performed at [facility]    Report Status 05/14/2015 FINAL  Final  Culture, Urine     Status: None   Collection Time: 05/11/15  4:43 PM  Result Value Ref Range Status   Specimen Description URINE, CATHETERIZED  Final   Special Requests NONE  Final   Culture NO GROWTH 2 DAYS  Final   Report Status 05/13/2015 FINAL  Final     Studies: No results found.  Scheduled Meds: . ALPRAZolam  0.25 mg Oral QHS  . [START ON 05/16/2015] aspirin  81 mg Oral Pre-Cath  . heparin subcutaneous  5,000 Units Subcutaneous 3 times per day  . insulin aspart  0-5 Units Subcutaneous QHS  . insulin aspart  0-9  Units Subcutaneous TID WC  . piperacillin-tazobactam (ZOSYN)  IV  3.375 g Intravenous 3 times per day  . pravastatin  80 mg Oral q1800  . predniSONE  60 mg Oral Q breakfast  . sincalide  0.02 mcg/kg Intravenous Once  . [START ON 05/16/2015] sodium chloride  3 mL Intravenous Q12H  . vancomycin  750 mg Intravenous Q24H   Continuous Infusions: . sodium chloride         Time spent: 35 minutes    Kaydan Wilhoite A  [facility] Pager [phone] If 7PM-7AM, please contact night-coverage at [email or web address], password [facility] 05/15/2015, 1:10 PM  LOS: 7 days

## 2015-05-15 NOTE — Progress Notes (Signed)
OT Cancellation Note and Discharge  Patient Details Name: Robert [name] MRN: [id number] DOB: 19-Aug-1928   Cancelled Treatment:    Reason Eval/Treat Not Completed: OT screened, no needs identified, will sign off. In to see pt for OT eval, pt reports he just walked with nursing staff is is perfectly fine when he is up on his feet and does not have any concerns about his BADLs or IADLs.   Almon Register [id number] 05/15/2015, 11:54 AM

## 2015-05-15 NOTE — Progress Notes (Signed)
Patient ambulated 600 ft in hallway and tolerated well.

## 2015-05-16 ENCOUNTER — Encounter (HOSPITAL_COMMUNITY)
Admission: EM | Disposition: A | Discharge status: Home / Self Care | Payer: Self-pay | Source: Home / Self Care | Attending: Pulmonary Disease

## 2015-05-16 ENCOUNTER — Other Ambulatory Visit: Payer: Self-pay | Admitting: Interventional Cardiology

## 2015-05-16 LAB — BASIC METABOLIC PANEL
Anion gap: 6 (ref 5–15)
BUN: 42 mg/dL — AB (ref 6–20)
CHLORIDE: 108 mmol/L (ref 101–111)
CO2: 22 mmol/L (ref 22–32)
CREATININE: 2.47 mg/dL — AB (ref 0.61–1.24)
Calcium: 8.5 mg/dL — ABNORMAL LOW (ref 8.9–10.3)
GFR calc Af Amer: 26 mL/min — ABNORMAL LOW (ref >60)
GFR calc non Af Amer: 22 mL/min — ABNORMAL LOW (ref >60)
GLUCOSE: 302 mg/dL — AB (ref 65–99)
POTASSIUM: 5 mmol/L (ref 3.5–5.1)
SODIUM: 136 mmol/L (ref 135–145)

## 2015-05-16 LAB — GLUCOSE, CAPILLARY
GLUCOSE-CAPILLARY: 333 mg/dL — AB (ref 65–99)
Glucose-Capillary: 288 mg/dL — ABNORMAL HIGH (ref 65–99)

## 2015-05-16 SURGERY — LEFT HEART CATH AND CORONARY ANGIOGRAPHY
Anesthesia: LOCAL

## 2015-05-16 MED ORDER — AMOXICILLIN-POT CLAVULANATE 875-125 MG PO TABS: 1.0000 | ORAL_TABLET | Freq: Two times a day (BID) | ORAL | Status: DC

## 2015-05-16 NOTE — Progress Notes (Signed)
Patient is being discharged home in stable condition. Discharge instructions read to and understood via patient and son. Prescriptions given to patient. All belongings with patient. No pain at the time of discharge.

## 2015-05-16 NOTE — Care Management Note (Signed)
Case Management Note  Patient Details  Name: Robert [name] MRN: [id number] Date of Birth: 18-Sep-1928  Subjective/Objective:  Pt admitted for cp. Plan for d/c today.                   Action/Plan: CM was able to speak with pt in regards to disposition needs. Pt is from home with son and will not need HH services at this time. Pt has a son that is a Community education officer and he will assist with needs. No further needs from CM at this time.   Bethena Roys, RN 05/16/2015, 1:03 PM

## 2015-05-16 NOTE — Progress Notes (Signed)
Inpatient Diabetes Program Recommendations  AACE/ADA: New Consensus Statement on Inpatient Glycemic Control (2013)  Target Ranges:  Prepandial:   less than 140 mg/dL      Peak postprandial:   less than 180 mg/dL (1-2 hours)      Critically ill patients:  140 - 180 mg/dL   Results for LYRIQ, MARCIA (MRN [id number]) as of 05/16/2015 09:30  Ref. Range 05/15/2015 07:36 05/15/2015 11:35 05/15/2015 16:58 05/15/2015 21:41 05/16/2015 07:36  Glucose-Capillary Latest Ref Range: 65-99 mg/dL 254 (H) 278 (H) 373 (H) 322 (H) 288 (H)   Reason for Admission: CP  Diabetes history: DM 2 Outpatient Diabetes medications: Lantus 14 units Daily, Humalog 7 units TID meal coverage Current orders for Inpatient glycemic control: Novolog Sensitive TID + HS scale  Inpatient Diabetes Program Recommendations Insulin - Basal: Patient in 200-300 range. Glucose 288 this am. Patient takes Lantus 14 units Daily at home. Please consider starting patient's home dose of basal.  Thanks,  Tama Headings RN, MSN, [facility] Inpatient Diabetes Coordinator Team Pager [phone]

## 2015-05-16 NOTE — Progress Notes (Signed)
Patient Name: Robert [name] Date of Encounter: 05/16/2015  Principal Problem:   Chest pain Active Problems:   Coronary atherosclerosis of native coronary artery   Mixed hyperlipidemia   Essential hypertension, benign   Diabetes mellitus type 2, controlled   Symptomatic bradycardia   AV block, 2nd degree   Abdominal pain   Acute cholecystitis   Cholecystitis   Right upper quadrant pain   NSVT (nonsustained ventricular tachycardia)   Complete heart block   Length of Stay: 8  SUBJECTIVE  The patient states that he feels best in a long time. No angina  no further Vt   CURRENT MEDS . ALPRAZolam  0.25 mg Oral QHS  . heparin subcutaneous  5,000 Units Subcutaneous 3 times per day  . insulin aspart  0-5 Units Subcutaneous QHS  . insulin aspart  0-9 Units Subcutaneous TID WC  . piperacillin-tazobactam (ZOSYN)  IV  3.375 g Intravenous 3 times per day  . pravastatin  80 mg Oral q1800  . predniSONE  40 mg Oral Q breakfast  . sincalide  0.02 mcg/kg Intravenous Once  . sodium chloride  3 mL Intravenous Q12H  . vancomycin  750 mg Intravenous Q24H    OBJECTIVE  Filed Vitals:   05/15/15 0500 05/15/15 1409 05/15/15 2100 05/16/15 0500  BP: 108/63 113/62 133/73 122/57  Pulse: 74 73 77 78  Temp: 97.5 F (36.4 C) 97.7 F (36.5 C) 97.9 F (36.6 C) 97.5 F (36.4 C)  TempSrc: Oral Oral Oral Oral  Resp:  15    Height:      Weight: 89.495 kg (197 lb 4.8 oz)     SpO2: 98% 97% 97% 98%    Intake/Output Summary (Last 24 hours) at 05/16/15 0807 Last data filed at 05/16/15 [id number]  Gross per 24 hour  Intake 686.73 ml  Output   2055 ml  Net -1368.27 ml   Filed Weights   05/13/15 0505 05/14/15 0500 05/15/15 0500  Weight: 87.998 kg (194 lb) 89.631 kg (197 lb 9.6 oz) 89.495 kg (197 lb 4.8 oz)    PHYSICAL EXAM  General: Pleasant, NAD. Neuro: Alert and oriented X 3. Moves all extremities spontaneously. Psych: Normal affect. HEENT:  Normal  Neck: Supple without bruits or  JVD. Lungs:  Resp regular and unlabored, CTA. Heart: RRR no s3, s4, or murmurs. Abdomen: has a perc drain from abd.  Draining bilious fluid  Extremities: No clubbing, cyanosis or edema. DP/PT/Radials 2+ and equal bilaterally.  Accessory Clinical Findings  CBC  Recent Labs  05/14/15 0414  WBC 8.9  HGB 9.6*  HCT 28.5*  MCV 104.8*  PLT [id number]   Basic Metabolic Panel  Recent Labs  05/14/15 0414 05/16/15 0358  NA 138 136  K 5.0 5.0  CL 110 108  CO2 22 22  GLUCOSE 330* 302*  BUN 36* 42*  CREATININE 2.55* 2.47*  CALCIUM 8.5* 8.5*   Liver Function Tests  Recent Labs  05/14/15 0414  AST 32  ALT 29  ALKPHOS 42  BILITOT 0.9  PROT 5.7*  ALBUMIN 2.5*   Radiology/Studies  Nm Myocar Multi W/spect W/wall Motion / Ef  05/08/2015   CLINICAL DATA:  Chest pain and history of coronary artery disease with prior PCI and myocardial infarction.    IMPRESSION: 1. Large fixed scar involving the inferior and inferolateral walls consistent with prior infarction. Possible subtle distal anterior and mid to distal anterolateral wall ischemia. As above, this is not a significant quantitative reversible defect but subjectively does appear  mildly diminished on the stress acquisition.  2. Akinetic inferior wall. Lateral and septal hypokinesis. Moderately dilated left ventricular cavity.  3. Left ventricular ejection fraction 36%  4. Intermediate-risk stress test findings*.  *2012 Appropriate Use Criteria for Coronary Revascularization Focused Update: J Am Coll Cardiol. N6492421. http://content.airportbarriers.com.aspx?articleid=1201161   Electronically Signed   By: Aletta Edouard M.D.   On: 05/08/2015 13:37     TELE: SR    ASSESSMENT AND PLAN    81 y/o M with CAD s/p acute posterior wall MI requiring IABP/DES to mCx DES in 2012, ICM EF 35-40%, MR, DM, CKD III-IV admitted with atypical CP and SOB with equivocal nuclear stress test Also had episode of mildly symptomatic bradycardia  8/16 with Wenckebach -> 2:1 -> CHB for about 2 minutes with HR down to the 30s. Original plan was for cath the next day, however, later that day developed acute shaking chills, fever, and diagnosis of cholecystitis by CT, GPC in chains in blood, and sepsis with hypotension requiring pressors and aggressive IVF. Went on to bump troponin.  1. Acute cholecystitis with septic shock, s/p perc drain 05/11/15 - per surgery/IM.  2. Chest pain/dyspnea - initially ruled out for MI but equivocal nuc. I have personally reviewed the nuc images - he has a prior MI but no significant ischemia/ He has not had any angina.  No further ventricular arrhythmias.  He is very active at home until he developed this cholecystitis. Given his significant CKD, I think that cath  poses significant risk of renal insufficiency without significant benefit. He does not have any ischemia on the study and he is asymptomatic.  I think that he can be discharged home. He'll see Dr. [name]   in the clinic. We can always proceed with cardiac catheter station if he develops symptoms.         Diannie Willner, Wonda Cheng, MD  05/16/2015 8:11 AM    [facility] [place],  [address] [place], [place]  [address] Pager [phone] Phone: [phone]; Fax: [phone]   [facility]  [address] [place],   [id number] [phone]   Fax [phone]

## 2015-05-16 NOTE — Discharge Summary (Addendum)
Physician Discharge Summary  Robert [name] [id number] DOB: 1928/02/26 DOA: 05/08/2015  PCP: Gennette Pac, MD  Admit date: 05/08/2015 Discharge date: 05/16/2015  Time spent: 40 minutes  Recommendations for Outpatient Follow-up:  1. Follow-up with Dr. Georgette Dover in 3 weeks. 2. Follow-up with Dr. [name] in 2 weeks. 3. Beta blockers discontinued because of long pause on telemetry while he is in the hospital.  Discharge Diagnoses:  Principal Problem:   Chest pain Active Problems:   Coronary atherosclerosis of native coronary artery   Mixed hyperlipidemia   Essential hypertension, benign   Diabetes mellitus type 2, controlled   Symptomatic bradycardia   AV block, 2nd degree   Abdominal pain   Acute cholecystitis   Cholecystitis   Right upper quadrant pain   NSVT (nonsustained ventricular tachycardia)   Complete heart block   Discharge Condition: Stable  Diet recommendation:   Filed Weights   05/13/15 0505 05/14/15 0500 05/15/15 0500  Weight: 87.998 kg (194 lb) 89.631 kg (197 lb 9.6 oz) 89.495 kg (197 lb 4.8 oz)    History of present illness:  81 year old male with a history of STEMI and PCI with DES in May 2012 diabetes mellitus, peripheral vascular disease, prostate cancer, GI bleed, presents with chest discomfort that began on the evening of 05/07/2015. The patient has some associated diaphoresis and nausea. However over the past week, he has noted increasing chest discomfort intermittently and shortness of breath with his usual activities of daily living. The patient states that his been trimming his trees and on the lawn and had some chest discomfort. He is essentially pain free at this point without any shortness of breath. he denies any syncope, fevers, chills, coughing, hemoptysis, diarrhea, hematochezia, melena. The patient also sent described some abdominal pain in the epigastric and periumbilical region that has since resolved since he came to the emergency  department. He is passing flatus. In emergency department, the patient was afebrile and hemodynamically stable. Lab work revealed serum creatinine 2.30. Hemoglobin is 10.3. Initial troponin was negative. Chest x-ray was negative for infiltrates. EKG was sinus rhythm without any ST-T wave changes. Point-of-care troponin was negative.    Hospital Course:   Sepsis and septic shock Presented with temperature of 102.3, RR of 27 and hypotension. Sepsis secondary to acute cholecystitis. Patient started on aggressive IV fluid hydration and IV antibiotics.  Patient was on vancomycin and Zosyn, on discharge placed on Augmentin to complete a total of 2 weeks of antibiotics.  Chest pain Patient admitted initially with chest pain, has had positive troponin, peak of 1.42. Seen by cardiologist, has intermediate risk stress test. Has had elevated troponins which are thought to be secondary to demand ischemia. Multiple pauses and nonsustained V. tach on telemetry. Initially cardiac catheterization was scheduled for Monday morning, canceled, patient to follow-up with cardiology as outpatient.  Acute cholecystitis Patient is on Zosyn and vancomycin, no neurosurgery for now. Pressure relieved by percutaneous cholecystostomy tube placed on 8/17. General surgery to see him as outpatient likely to remove the tube in 6 weeks.  He may or might not need cholecystectomy down the road, to follow-up with general surgery as outpatient.  NSVT Nonsustained ventricular tachycardia, patient has multiple different heart rhythm on the telemetry. Patient also has high grade block, 2 second pause, he is off of beta blockers. Follow-up with cards as outpatient, on discharge discontinued metoprolol.  Diabetes mellitus type 2 Carbohydrate modified diet Restarted home medications  Relative adrenal insufficiency As had cortisol level of 28.6, was on a  stress dose of steroids. Solu-Cortef discontinued, started on  prednisone taper. Tapered prednisone, currently on I do not think he needs prednisone taper on discharge. Blood pressure stable.   Procedures:  Placement of percutaneous biliary drain on 05/11/2015 done by Dr. Earleen Newport.  Placement of left IJ CVL done by Dr. Titus Mould on 05/11/2015.  Consultations:  Gen Surgery   Cardiology  Discharge Exam: Filed Vitals:   05/16/15 0730  BP: 124/70  Pulse: 76  Temp: 97.6 F (36.4 C)  Resp: 17   General: Alert and awake, oriented x3, not in any acute distress. HEENT: anicteric sclera, pupils reactive to light and accommodation, EOMI CVS: S1-S2 clear, no murmur rubs or gallops Chest: clear to auscultation bilaterally, no wheezing, rales or rhonchi Abdomen: soft nontender, nondistended, normal bowel sounds, no organomegaly Extremities: no cyanosis, clubbing or edema noted bilaterally Neuro: Cranial nerves II-XII intact, no focal neurological deficits  Discharge Instructions   Discharge Instructions    Diet - low sodium heart healthy    Complete by:  As directed      Increase activity slowly    Complete by:  As directed           Current Discharge Medication List    START taking these medications   Details  amoxicillin-clavulanate (AUGMENTIN) 875-125 MG per tablet Take 1 tablet by mouth 2 (two) times daily. Qty: 14 tablet, Refills: 0      CONTINUE these medications which have NOT CHANGED   Details  ALPRAZolam (XANAX) 0.25 MG tablet Take 0.25 mg by mouth at bedtime.     clopidogrel (PLAVIX) 75 MG tablet Take 1 tablet by mouth  daily Qty: 30 tablet, Refills: 0    furosemide (LASIX) 40 MG tablet Take one-half tablet by  mouth twice a day Qty: 90 tablet, Refills: 0    insulin lispro (HUMALOG KWIKPEN) 100 UNIT/ML KiwkPen 0-10 units three times per day Qty: 5 pen, Refills: 1    isosorbide mononitrate (IMDUR) 30 MG 24 hr tablet Take one-half tablet by  mouth daily Qty: 45 tablet, Refills: 0    LANTUS SOLOSTAR 100 UNIT/ML Solostar  Pen Inject subcutaneously 20  units daily Qty: 30 mL, Refills: 1    pravastatin (PRAVACHOL) 80 MG tablet Take 1 tablet by mouth  daily Qty: 90 tablet, Refills: 0    BD PEN NEEDLE NANO U/F 32G X 4 MM MISC Use to inject insulin 4  times daily as instructed. Qty: 400 each, Refills: 3    glucose blood (ONE TOUCH ULTRA TEST) test strip Use as instructed to check blood sugar 3 times per day dx code Dx code E11.29 Qty: 100 each, Refills: 3      STOP taking these medications     metoprolol tartrate (LOPRESSOR) 25 MG tablet        No Known Allergies Follow-up Information    Follow up with TSUEI,MATTHEW K., MD. Schedule an appointment as soon as possible for a visit in 3 weeks.   Specialty:  General Surgery   Contact information:   [address] [place] [address] [phone]       Follow up with Jettie Booze., MD In 2 weeks.   Specialties:  Cardiology, Radiology, Interventional Cardiology   Contact information:   [id number] [place] [address] [phone]        The results of significant diagnostics from this hospitalization (including imaging, microbiology, ancillary and laboratory) are listed below for reference.    Significant Diagnostic  Studies: Ct Abdomen Pelvis Wo Contrast  05/10/2015   CLINICAL DATA:  Acute onset epigastric and periumbilical pain. Initial encounter.  EXAM: CT ABDOMEN AND PELVIS WITHOUT CONTRAST  TECHNIQUE: Multidetector CT imaging of the abdomen and pelvis was performed following the standard protocol without IV contrast.  COMPARISON:  Abdominal ultrasound performed 08/23/2014, and abdominal radiograph performed 05/08/2015  FINDINGS: The visualized lung bases are clear. A few small metallic densities are seen scattered about the liver. Scattered coronary artery calcifications are seen.  The liver and spleen are unremarkable in appearance. There is mild soft tissue stranding about the gallbladder, with trace  pericholecystic fluid. Stones are seen within the gallbladder. This raises question for mild acute cholecystitis. The pancreas and adrenal glands are unremarkable.  Nonspecific perinephric stranding is noted bilaterally. Mild bilateral renal atrophy is noted. There is no evidence of hydronephrosis. No renal or ureteral stones are identified.  No free fluid is identified. The small bowel is unremarkable in appearance. The stomach is within normal limits. No acute vascular abnormalities are seen. Scattered calcification is noted along the abdominal aorta and its branches.  The appendix is normal in caliber and contains contrast, without evidence for appendicitis. Contrast passes through the level of the mid transverse colon. The colon is unremarkable in appearance.  The bladder is mildly distended and grossly unremarkable. The prostate is enlarged, measuring 6.4 cm in transverse dimension, with impression on the base of the bladder. An anterior abdominal wall mesh is noted at the lower abdominal wall. No inguinal lymphadenopathy is seen.  No acute osseous abnormalities are identified. Disc space narrowing and vacuum phenomenon are seen at L4-L5.  IMPRESSION: 1. Mild soft tissue stranding about the gallbladder, with trace pericholecystic fluid. Underlying cholelithiasis noted. Findings raise question for mild acute cholecystitis. 2. Scattered coronary artery calcifications seen. 3. Mild bilateral renal atrophy noted. 4. Scattered calcification along the abdominal aorta and its branches. 5. Enlarged prostate noted, with impression on the base of the bladder. Would correlate with PSA, as deemed clinically appropriate. These results were called by telephone at the time of interpretation on 05/10/2015 at 2:05 am to [facility] on [facility] 3W, who verbally acknowledged these results.   Electronically Signed   By: Garald Balding M.D.   On: 05/10/2015 02:06   Dg Chest 2 View  05/08/2015   CLINICAL DATA:  Acute onset of  generalized chest pain, shortness of breath and nausea. Initial encounter.  EXAM: CHEST  2 VIEW  COMPARISON:  Chest radiograph from 01/11/2012  FINDINGS: The lungs are well-aerated and clear. There is no evidence of focal opacification, pleural effusion or pneumothorax.  The heart is normal in size; the mediastinal contour is within normal limits. No acute osseous abnormalities are seen. Scattered clips are noted about the right upper quadrant.  IMPRESSION: No acute cardiopulmonary process seen.   Electronically Signed   By: Garald Balding M.D.   On: 05/08/2015 05:30   Nm Hepatobiliary Liver Func  05/10/2015   CLINICAL DATA:  Right upper quadrant pain. Rule out acute cholecystitis.  EXAM: NUCLEAR MEDICINE HEPATOBILIARY IMAGING  TECHNIQUE: Sequential images of the abdomen were obtained out to 60 minutes following intravenous administration of radiopharmaceutical. At the 90 minute mark, 3.4 mg of morphine were administered.  RADIOPHARMACEUTICALS:  5.4 mCi Tc-25m  Choletec IV  COMPARISON:  CT of 05/09/2015  FINDINGS: Initial images demonstrate prompt uptake of tracer by the liver. Common duct activity is identified at 10 minutes. Bowel activity is identified at 15 minutes. Through  90 minutes, no gallbladder activity is seen. With morphine, subsequent imaging for 30 minutes demonstrates no gallbladder activity.  IMPRESSION: Absent gallbladder activity, despite morphine augmentation. This most consistent with cystic duct obstruction/ acute cholecystitis.  These results will be called to the ordering clinician or representative by the Radiologist Assistant, and communication documented in the PACS or zVision Dashboard.   Electronically Signed   By: Abigail Miyamoto M.D.   On: 05/10/2015 19:19   Nm Myocar Multi W/spect W/wall Motion / Ef  05/08/2015   CLINICAL DATA:  Chest pain and history of coronary artery disease with prior PCI and myocardial infarction.  EXAM: MYOCARDIAL IMAGING WITH SPECT (REST AND  PHARMACOLOGIC-STRESS)  GATED LEFT VENTRICULAR WALL MOTION STUDY  LEFT VENTRICULAR EJECTION FRACTION  TECHNIQUE: Standard myocardial SPECT imaging was performed after resting intravenous injection of 10 mCi Tc-2m sestamibi. Subsequently, intravenous infusion of Lexiscan was performed under the supervision of the Cardiology staff. At peak effect of the drug, 30 mCi Tc-49m sestamibi was injected intravenously and standard myocardial SPECT imaging was performed. Quantitative gated imaging was also performed to evaluate left ventricular wall motion, and estimate left ventricular ejection fraction.  COMPARISON:  None.  FINDINGS: Perfusion: There is a large fixed perfusion defect involving the inferior and inferolateral walls consistent with scar. Subtle decreased perfusion of the distal anterior and mid to distal anterolateral wall present. This does not register as a significant quantitative reversible defect by soft for analysis. Subtle inducible ischemia in this region cannot be excluded.  Wall Motion: The left ventricle is moderately dilated. Wall motion analysis demonstrates a nearly akinetic inferior wall with some motion present near the apex. The lateral and septal walls are hypokinetic. The anterior wall shows normal motion.  Left Ventricular Ejection Fraction: 36 %  End diastolic volume [id number] ml  End systolic volume [id number] ml  IMPRESSION: 1. Large fixed scar involving the inferior and inferolateral walls consistent with prior infarction. Possible subtle distal anterior and mid to distal anterolateral wall ischemia. As above, this is not a significant quantitative reversible defect but subjectively does appear mildly diminished on the stress acquisition.  2. Akinetic inferior wall. Lateral and septal hypokinesis. Moderately dilated left ventricular cavity.  3. Left ventricular ejection fraction 36%  4. Intermediate-risk stress test findings*.  *2012 Appropriate Use Criteria for Coronary Revascularization Focused  Update: J Am Coll Cardiol. [id number]. http://content.airportbarriers.com.aspx?articleid=[email or web address]   Electronically Signed   By: Aletta Edouard M.D.   On: 05/08/2015 13:37   Ir Perc Cholecystostomy  05/11/2015   INDICATION: Acute cholecystitis  EXAM: ULTRASOUND AND FLUOROSCOPIC-GUIDED CHOLECYSTOSTOMY TUBE PLACEMENT  COMPARISON:  None.  MEDICATIONS: Fentanyl 50 mcg IV; Versed 1.0 mg IV; The patient is currently admitted to the hospital and on intravenous antibiotics. Antibiotics were administered within an appropriate time frame prior to skin puncture.  ANESTHESIA/SEDATION: Total Moderate Sedation Time  Fifteen minutes  CONTRAST:  51mL OMNIPAQUE IOHEXOL 300 MG/ML  SOLN  FLUOROSCOPY TIME:  0 minutes 36 seconds  COMPLICATIONS: None  PROCEDURE: Informed written consent was obtained from the patient after a discussion of the risks, benefits and alternatives to treatment. Questions regarding the procedure were encouraged and answered. A timeout was performed prior to the initiation of the procedure.  The right upper abdominal quadrant was prepped and draped in the usual sterile fashion, and a sterile drape was applied covering the operative field. Maximum barrier sterile technique with sterile gowns and gloves were used for the procedure. A timeout was performed prior to the initiation of the procedure.  Local anesthesia was provided with 1% lidocaine with epinephrine.  Ultrasound scanning of the right upper quadrant demonstrates a markedly dilated gallbladder. Of note, the patient reported pain with ultrasound imaging over the gallbladder. Utilizing a transhepatic approach, a 22 gauge needle was advanced into the gallbladder under direct ultrasound guidance. An ultrasound image was saved for documentation purposes. Appropriate intraluminal puncture was confirmed with the efflux of bile and advancement of an 0.018 wire into the gallbladder lumen. The needle was exchanged for an [place] set. A small amount of  contrast was injected to confirm appropriate intraluminal positioning. Over a Benson wire, a 14.2-French Cook cholecystomy tube was advanced into the gallbladder fossa, coiled and locked. Bile was aspirated and a small amount of contrast was injected as several post procedural spot radiographic images were obtained in various obliquities. The catheter was secured to the skin with suture, connected to a drainage bag and a dressing was placed. The patient tolerated the procedure well without immediate post procedural complication.  Sample was sent for analysis.  IMPRESSION: Status post percutaneous cholecystostomy tube placement.  Signed,  Dulcy Fanny. Earleen Newport, DO  [facility]  [facility]   Electronically Signed   By: Corrie Mckusick D.O.   On: 05/11/2015 15:34   Dg Chest Port 1 View  05/11/2015   CLINICAL DATA:  Central line placement.  EXAM: PORTABLE CHEST - 1 VIEW  COMPARISON:  Chest x-ray from 3 days prior  FINDINGS: New left IJ central line, tip at the upper SVC.  No pneumothorax.  Mild cardiomegaly and vascular pedicle widening. Possible trace right pleural effusion. No pulmonary edema.  IMPRESSION: New left IJ catheter without complicating features.   Electronically Signed   By: Monte Fantasia M.D.   On: 05/11/2015 05:06   Dg Abd 2 Views  05/08/2015   CLINICAL DATA:  Intermittent periumbilical abdominal pain, diarrhea, history of prostate cancer  EXAM: ABDOMEN - 2 VIEW  COMPARISON:  None.  FINDINGS: Nonobstructive bowel gas pattern.  No evidence of free air under the diaphragm on the upright view.  Lower abdominal/ inguinal hernia mesh repair.  Degenerative changes of the lower lumbar spine.  IMPRESSION: No evidence of small bowel obstruction or free air.   Electronically Signed   By: Julian Hy M.D.   On: 05/08/2015 17:00    Microbiology: Recent Results (from the past 240 hour(s))  Culture, blood (routine x 2)     Status: None   Collection Time:  05/09/15  6:25 PM  Result Value Ref Range Status   Specimen Description BLOOD LEFT HAND  Final   Special Requests BOTTLES DRAWN AEROBIC AND ANAEROBIC 5CC  Final   Culture NO GROWTH 5 DAYS  Final   Report Status 05/14/2015 FINAL  Final  Culture, blood (routine x 2)     Status: None   Collection Time: 05/09/15  6:30 PM  Result Value Ref Range Status   Specimen Description BLOOD RIGHT HAND  Final   Special Requests IN PEDIATRIC BOTTLE 3CC  Final   Culture  Setup Time   Final    GRAM POSITIVE COCCI IN CHAINS AEROBIC BOTTLE ONLY CRITICAL RESULT CALLED TO, READ BACK BY AND VERIFIED WITH: J LAUER 05/10/15 @ [place]. CORRECTED RESULTS CALLED TO: L Martinek,RN AT 1142 05/11/15 BY L BENFIELD    Culture   Final    MICROAEROPHILIC STREPTOCOCCI Standardized susceptibility testing for this organism is not available.    Report Status 05/12/2015 FINAL  Final  MRSA  PCR Screening     Status: None   Collection Time: 05/11/15  4:23 AM  Result Value Ref Range Status   MRSA by PCR NEGATIVE NEGATIVE Final    Comment:        The GeneXpert MRSA Assay (FDA approved for NASAL specimens only), is one component of a comprehensive MRSA colonization surveillance program. It is not intended to diagnose MRSA infection nor to guide or monitor treatment for MRSA infections.   Culture, routine-abscess     Status: None   Collection Time: 05/11/15 12:07 PM  Result Value Ref Range Status   Specimen Description ABSCESS  Final   Special Requests GALLBLADDER  Final   Gram Stain   Final    ABUNDANT WBC PRESENT,BOTH PMN AND MONONUCLEAR RARE SQUAMOUS EPITHELIAL CELLS PRESENT ABUNDANT GRAM NEGATIVE RODS MODERATE GRAM POSITIVE COCCI IN PAIRS FEW GRAM POSITIVE RODS    Culture   Final    MULTIPLE ORGANISMS PRESENT, NONE PREDOMINANT Note: NO STAPHYLOCOCCUS AUREUS ISOLATED NO GROUP A STREP (S.PYOGENES) ISOLATED Performed at [facility]    Report Status 05/14/2015 FINAL  Final  Culture, Urine      Status: None   Collection Time: 05/11/15  4:43 PM  Result Value Ref Range Status   Specimen Description URINE, CATHETERIZED  Final   Special Requests NONE  Final   Culture NO GROWTH 2 DAYS  Final   Report Status 05/13/2015 FINAL  Final     Labs: Basic Metabolic Panel:  Recent Labs Lab 05/09/15 1230  05/11/15 0530 05/12/15 0420 05/13/15 0450 05/14/15 0414 05/16/15 0358  NA  --   < > 139 142 137 138 136  K  --   < > 4.2 4.7 4.6 5.0 5.0  CL  --   < > 113* 115* 108 110 108  CO2  --   < > 19* 20* 18* 22 22  GLUCOSE  --   < > 103* 126* 350* 330* 302*  BUN  --   < > 30* 28* 30* 36* 42*  CREATININE  --   < > 2.39* 2.34* 2.68* 2.55* 2.47*  CALCIUM  --   < > 6.9* 7.5* 8.1* 8.5* 8.5*  MG 2.3  --  1.7  --   --   --   --   PHOS  --   --  2.6  --   --   --   --   < > = values in this interval not displayed. Liver Function Tests:  Recent Labs Lab 05/10/15 0424 05/12/15 0420 05/14/15 0414  AST 30 33 32  ALT 18 29 29   ALKPHOS 39 44 42  BILITOT 2.0* 0.9 0.9  PROT 5.2* 4.9* 5.7*  ALBUMIN 2.7* 2.3* 2.5*   No results for input(s): LIPASE, AMYLASE in the last 168 hours. No results for input(s): AMMONIA in the last 168 hours. CBC:  Recent Labs Lab 05/10/15 0424 05/11/15 0530 05/12/15 0420 05/13/15 0450 05/14/15 0414  WBC 23.5* 14.9* 10.0 8.5 8.9  NEUTROABS  --   --  8.3*  --   --   HGB 9.1* 8.3* 8.6* 10.5* 9.6*  HCT 27.0* 25.0* 25.4* 31.7* 28.5*  MCV 107.6* 108.2* 105.0* 105.7* 104.8*  PLT 198 156 152 251 252   Cardiac Enzymes:  Recent Labs Lab 05/10/15 0424 05/10/15 1540 05/11/15 0530  TROPONINI 1.42* 0.80* 0.36*   BNP: BNP (last 3 results) No results for input(s): BNP in the last 8760 hours.  ProBNP (last 3 results) No results for  input(s): PROBNP in the last 8760 hours.  CBG:  Recent Labs Lab 05/15/15 0736 05/15/15 1135 05/15/15 1658 05/15/15 2141 05/16/15 0736  GLUCAP 254* 278* 373* 322* 288*       Signed:  Bijou Easler A  [facility] 05/16/2015, 10:32 AM

## 2015-05-17 ENCOUNTER — Other Ambulatory Visit: Payer: Self-pay | Admitting: *Deleted

## 2015-05-19 ENCOUNTER — Other Ambulatory Visit: Payer: Self-pay | Admitting: *Deleted

## 2015-05-19 MED ORDER — CLOPIDOGREL BISULFATE 75 MG PO TABS: 75.0000 mg | ORAL_TABLET | Freq: Every day | ORAL | Status: DC

## 2015-05-24 ENCOUNTER — Telehealth: Payer: Self-pay | Admitting: Interventional Cardiology

## 2015-05-24 ENCOUNTER — Emergency Department (HOSPITAL_COMMUNITY)
Admission: EM | Admit: 2015-05-24 | Discharge: 2015-05-24 | Disposition: A | Discharge status: Home / Self Care | Payer: Medicare Other | Attending: Emergency Medicine | Admitting: Emergency Medicine

## 2015-05-24 ENCOUNTER — Encounter (HOSPITAL_COMMUNITY): Payer: Self-pay | Admitting: Emergency Medicine

## 2015-05-24 DIAGNOSIS — Z79899 Other long term (current) drug therapy: Secondary | ICD-10-CM | POA: Diagnosis not present

## 2015-05-24 DIAGNOSIS — E1129 Type 2 diabetes mellitus with other diabetic kidney complication: Secondary | ICD-10-CM | POA: Diagnosis not present

## 2015-05-24 DIAGNOSIS — Z792 Long term (current) use of antibiotics: Secondary | ICD-10-CM | POA: Insufficient documentation

## 2015-05-24 DIAGNOSIS — R011 Cardiac murmur, unspecified: Secondary | ICD-10-CM | POA: Insufficient documentation

## 2015-05-24 DIAGNOSIS — I252 Old myocardial infarction: Secondary | ICD-10-CM | POA: Diagnosis not present

## 2015-05-24 DIAGNOSIS — Z862 Personal history of diseases of the blood and blood-forming organs and certain disorders involving the immune mechanism: Secondary | ICD-10-CM | POA: Diagnosis not present

## 2015-05-24 DIAGNOSIS — Z87891 Personal history of nicotine dependence: Secondary | ICD-10-CM | POA: Diagnosis not present

## 2015-05-24 DIAGNOSIS — R358 Other polyuria: Secondary | ICD-10-CM | POA: Insufficient documentation

## 2015-05-24 DIAGNOSIS — E780 Pure hypercholesterolemia: Secondary | ICD-10-CM | POA: Diagnosis not present

## 2015-05-24 DIAGNOSIS — F419 Anxiety disorder, unspecified: Secondary | ICD-10-CM | POA: Diagnosis not present

## 2015-05-24 DIAGNOSIS — I959 Hypotension, unspecified: Secondary | ICD-10-CM | POA: Diagnosis not present

## 2015-05-24 DIAGNOSIS — I25119 Atherosclerotic heart disease of native coronary artery with unspecified angina pectoris: Secondary | ICD-10-CM | POA: Diagnosis not present

## 2015-05-24 DIAGNOSIS — R531 Weakness: Secondary | ICD-10-CM | POA: Insufficient documentation

## 2015-05-24 DIAGNOSIS — Z9861 Coronary angioplasty status: Secondary | ICD-10-CM | POA: Diagnosis not present

## 2015-05-24 DIAGNOSIS — Z8546 Personal history of malignant neoplasm of prostate: Secondary | ICD-10-CM | POA: Diagnosis not present

## 2015-05-24 DIAGNOSIS — Z794 Long term (current) use of insulin: Secondary | ICD-10-CM | POA: Diagnosis not present

## 2015-05-24 DIAGNOSIS — Z8719 Personal history of other diseases of the digestive system: Secondary | ICD-10-CM | POA: Diagnosis not present

## 2015-05-24 LAB — URINALYSIS, ROUTINE W REFLEX MICROSCOPIC
Bilirubin Urine: NEGATIVE
Glucose, UA: NEGATIVE mg/dL
HGB URINE DIPSTICK: NEGATIVE
Ketones, ur: NEGATIVE mg/dL
LEUKOCYTES UA: NEGATIVE
NITRITE: NEGATIVE
PROTEIN: NEGATIVE mg/dL
SPECIFIC GRAVITY, URINE: 1.009 (ref 1.005–1.030)
UROBILINOGEN UA: 0.2 mg/dL (ref 0.0–1.0)
pH: 5.5 (ref 5.0–8.0)

## 2015-05-24 LAB — COMPREHENSIVE METABOLIC PANEL
ALT: 37 U/L (ref 17–63)
AST: 28 U/L (ref 15–41)
Albumin: 3.5 g/dL (ref 3.5–5.0)
Alkaline Phosphatase: 50 U/L (ref 38–126)
Anion gap: 8 (ref 5–15)
BUN: 38 mg/dL — ABNORMAL HIGH (ref 6–20)
CHLORIDE: 100 mmol/L — AB (ref 101–111)
CO2: 26 mmol/L (ref 22–32)
CREATININE: 2.62 mg/dL — AB (ref 0.61–1.24)
Calcium: 8.7 mg/dL — ABNORMAL LOW (ref 8.9–10.3)
GFR, EST AFRICAN AMERICAN: 24 mL/min — AB (ref >60)
GFR, EST NON AFRICAN AMERICAN: 21 mL/min — AB (ref >60)
Glucose, Bld: 144 mg/dL — ABNORMAL HIGH (ref 65–99)
POTASSIUM: 4.5 mmol/L (ref 3.5–5.1)
SODIUM: 134 mmol/L — AB (ref 135–145)
Total Bilirubin: 0.8 mg/dL (ref 0.3–1.2)
Total Protein: 6.4 g/dL — ABNORMAL LOW (ref 6.5–8.1)

## 2015-05-24 LAB — CBC WITH DIFFERENTIAL/PLATELET
BASOS ABS: 0.1 10*3/uL (ref 0.0–0.1)
Basophils Relative: 2 % — ABNORMAL HIGH (ref 0–1)
EOS PCT: 2 % (ref 0–5)
Eosinophils Absolute: 0.1 10*3/uL (ref 0.0–0.7)
HCT: 30.4 % — ABNORMAL LOW (ref 39.0–52.0)
HEMOGLOBIN: 10.2 g/dL — AB (ref 13.0–17.0)
LYMPHS ABS: 2.3 10*3/uL (ref 0.7–4.0)
LYMPHS PCT: 35 % (ref 12–46)
MCH: 35.4 pg — AB (ref 26.0–34.0)
MCHC: 33.6 g/dL (ref 30.0–36.0)
MCV: 105.6 fL — AB (ref 78.0–100.0)
Monocytes Absolute: 1.3 10*3/uL — ABNORMAL HIGH (ref 0.1–1.0)
Monocytes Relative: 20 % — ABNORMAL HIGH (ref 3–12)
NEUTROS ABS: 2.6 10*3/uL (ref 1.7–7.7)
NEUTROS PCT: 41 % — AB (ref 43–77)
PLATELETS: 244 10*3/uL (ref 150–400)
RBC: 2.88 MIL/uL — AB (ref 4.22–5.81)
RDW: 16.2 % — ABNORMAL HIGH (ref 11.5–15.5)
WBC: 6.4 10*3/uL (ref 4.0–10.5)

## 2015-05-24 LAB — I-STAT CG4 LACTIC ACID, ED: Lactic Acid, Venous: 1.06 mmol/L (ref 0.5–2.0)

## 2015-05-24 MED ORDER — FUROSEMIDE 20 MG PO TABS: ORAL_TABLET | ORAL | Status: DC

## 2015-05-24 MED ORDER — SODIUM CHLORIDE 0.9 % IV BOLUS (SEPSIS)
500.0000 mL | Freq: Once | INTRAVENOUS | Status: AC
  Administered 2015-05-24: 500 mL via INTRAVENOUS

## 2015-05-24 NOTE — ED Notes (Signed)
Pt here with hypotension today at home; pt sts recent hospitalization and here for possible fluids and med adjustment; pt denies complaint at present; pt sts feels BP is lower in morning and has drain from gall bladder

## 2015-05-24 NOTE — Telephone Encounter (Signed)
Patient called up a b/p of 70/50 with weakness and dizziness.  Discharged from hospital on 05-16-15 with b/p of 124/70.  Says that he urinates frequently during the night.  Advised patient to have his son bring him to the ED asap.  Patient wanted to drive himself to ED.  Advised him to have his son drive him. Patient says his son will be home from work soon.  Patient verbalized understanding.  Called patients son, Robert [name], advised of conversation with his father.  Robert [name] was in agreement with the need to drive his father to the hospital. Michela Pitcher he will take him there.   Will let Dr [name] know of this plan.

## 2015-05-24 NOTE — Telephone Encounter (Signed)
New message    Pt c/o BP issue: STAT if pt c/o blurred vision, one-sided weakness or slurred speech  1. What are your last 5 BP readings? This morning running in the low 70's and 80's; did not have exact b/p readings  2. Are you having any other symptoms (ex. Dizziness, headache, blurred vision, passed out)? No  3. What is your BP issue? B/p running lower than normal and pt is very weak  Pt son is wanting to know if pt is on b/p medication If you don't reach pt son, please call pt at home

## 2015-05-24 NOTE — ED Notes (Signed)
Pt. Left with all belongings and refused wheelchair. Discharge instructions were reviewed and all questions were answered.  

## 2015-05-24 NOTE — Discharge Instructions (Signed)
As discussed, your evaluation today has been largely reassuring.  But, it is important that you monitor your condition carefully, and do not hesitate to return to the ED if you develop new, or concerning changes in your condition.  Otherwise, please follow-up with your physician for appropriate ongoing care.    Hypotension As your heart beats, it forces blood through your arteries. This force is your blood pressure. If your blood pressure is too low for you to go about your normal activities or to support the organs of your body, you have hypotension. Hypotension is also referred to as low blood pressure. When your blood pressure becomes too low, you may not get enough blood to your brain. As a result, you may feel weak, feel lightheaded, or develop a rapid heart rate. In a more severe case, you may faint. CAUSES Various conditions can cause hypotension. These include:  Blood loss.  Dehydration.  Heart or endocrine problems.  Pregnancy.  Severe infection.  Not having a well-balanced diet filled with needed nutrients.  Severe allergic reactions (anaphylaxis). Some medicines, such as blood pressure medicine or water pills (diuretics), may lower your blood pressure below normal. Sometimes taking too much medicine or taking medicine not as directed can cause hypotension. TREATMENT  Hospitalization is sometimes required for hypotension if fluid or blood replacement is needed, if time is needed for medicines to wear off, or if further monitoring is needed. Treatment might include changing your diet, changing your medicines (including medicines aimed at raising your blood pressure), and use of support stockings. HOME CARE INSTRUCTIONS   Drink enough fluids to keep your urine clear or pale yellow.  Take your medicines as directed by your health care provider.  Get up slowly from reclining or sitting positions. This gives your blood pressure a chance to adjust.  Wear support stockings as  directed by your health care provider.  Maintain a healthy diet by including nutritious food, such as fruits, vegetables, nuts, whole grains, and lean meats. SEEK MEDICAL CARE IF:  You have vomiting or diarrhea.  You have a fever for more than 2-3 days.  You feel more thirsty than usual.  You feel weak and tired. SEEK IMMEDIATE MEDICAL CARE IF:   You have chest pain or a fast or irregular heartbeat.  You have a loss of feeling in some part of your body, or you lose movement in your arms or legs.  You have trouble speaking.  You become sweaty or feel lightheaded.  You faint. MAKE SURE YOU:   Understand these instructions.  Will watch your condition.  Will get help right away if you are not doing well or get worse. Document Released: 09/10/2005 Document Revised: 07/01/2013 Document Reviewed: 03/13/2013 [facility] Patient Information 2015 [place], [place]. This information is not intended to replace advice given to you by your health care provider. Make sure you discuss any questions you have with your health care provider.
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
## 2015-05-27 ENCOUNTER — Encounter: Payer: Self-pay | Admitting: Nurse Practitioner

## 2015-05-27 ENCOUNTER — Ambulatory Visit (INDEPENDENT_AMBULATORY_CARE_PROVIDER_SITE_OTHER): Payer: Medicare Other | Admitting: Nurse Practitioner

## 2015-05-27 ENCOUNTER — Telehealth: Payer: Self-pay | Admitting: *Deleted

## 2015-05-27 VITALS — BP 122/80 | HR 100 | Ht 68.0 in | Wt 172.8 lb

## 2015-05-27 DIAGNOSIS — E875 Hyperkalemia: Secondary | ICD-10-CM

## 2015-05-27 DIAGNOSIS — I5021 Acute systolic (congestive) heart failure: Secondary | ICD-10-CM | POA: Diagnosis not present

## 2015-05-27 DIAGNOSIS — I2510 Atherosclerotic heart disease of native coronary artery without angina pectoris: Secondary | ICD-10-CM | POA: Insufficient documentation

## 2015-05-27 DIAGNOSIS — I2583 Coronary atherosclerosis due to lipid rich plaque: Secondary | ICD-10-CM

## 2015-05-27 DIAGNOSIS — N183 Chronic kidney disease, stage 3 unspecified: Secondary | ICD-10-CM

## 2015-05-27 DIAGNOSIS — I951 Orthostatic hypotension: Secondary | ICD-10-CM

## 2015-05-27 DIAGNOSIS — I340 Nonrheumatic mitral (valve) insufficiency: Secondary | ICD-10-CM | POA: Diagnosis not present

## 2015-05-27 DIAGNOSIS — I255 Ischemic cardiomyopathy: Secondary | ICD-10-CM | POA: Insufficient documentation

## 2015-05-27 DIAGNOSIS — E7800 Pure hypercholesterolemia, unspecified: Secondary | ICD-10-CM | POA: Insufficient documentation

## 2015-05-27 LAB — BASIC METABOLIC PANEL
BUN: 30 mg/dL — ABNORMAL HIGH (ref 6–23)
CALCIUM: 9.5 mg/dL (ref 8.4–10.5)
CO2: 26 mEq/L (ref 19–32)
CREATININE: 2.17 mg/dL — AB (ref 0.40–1.50)
Chloride: 109 mEq/L (ref 96–112)
GFR: 30.89 mL/min — AB (ref >60.00)
Glucose, Bld: 200 mg/dL — ABNORMAL HIGH (ref 70–99)
Potassium: 5.4 mEq/L — ABNORMAL HIGH (ref 3.5–5.1)
SODIUM: 142 meq/L (ref 135–145)

## 2015-05-27 NOTE — Patient Instructions (Addendum)
Medication Instructions:  1.DO NOT TAKE METOPROLOL  2. LASIX ( 40 MG ) TAKE ONE HALF TABLET AS NEEDED FOR WEIGHT GAIN OF 2 LBS OR MORE IN 24 HOURS  Labwork: Your physician recommends that you lab work today: bmet   Testing/Procedures: -None  Follow-Up: Your physician recommends that you keep your scheduled  follow-up appointment with Dr. [name]   Any Other Special Instructions Will Be Listed Below (If Applicable).
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
## 2015-05-27 NOTE — Telephone Encounter (Signed)
Potassium 5.4 Per Ignacia Bayley NP pt to take 20 mg of Lasix today and return to the office for labs(BMET) on  Tuesday. Pt aware . He verbalized understanding. Note: pt states taken Lasix 20 mg as needed for weigh gain and LE edema.

## 2015-05-31 ENCOUNTER — Other Ambulatory Visit (INDEPENDENT_AMBULATORY_CARE_PROVIDER_SITE_OTHER): Payer: Medicare Other | Admitting: *Deleted

## 2015-05-31 DIAGNOSIS — E875 Hyperkalemia: Secondary | ICD-10-CM

## 2015-05-31 LAB — BASIC METABOLIC PANEL
BUN: 30 mg/dL — ABNORMAL HIGH (ref 6–23)
CHLORIDE: 108 meq/L (ref 96–112)
CO2: 25 mEq/L (ref 19–32)
CREATININE: 2.01 mg/dL — AB (ref 0.40–1.50)
Calcium: 9.4 mg/dL (ref 8.4–10.5)
GFR: 33.74 mL/min — ABNORMAL LOW (ref >60.00)
Glucose, Bld: 157 mg/dL — ABNORMAL HIGH (ref 70–99)
Potassium: 4.9 mEq/L (ref 3.5–5.1)
SODIUM: 141 meq/L (ref 135–145)

## 2015-05-31 NOTE — Addendum Note (Signed)
Addended by: Eulis Foster on: 05/31/2015 09:31 AM   Modules accepted: Orders

## 2015-06-03 ENCOUNTER — Other Ambulatory Visit (HOSPITAL_COMMUNITY): Payer: Self-pay | Admitting: Interventional Radiology

## 2015-06-03 ENCOUNTER — Other Ambulatory Visit: Payer: Self-pay | Admitting: Surgery

## 2015-06-03 DIAGNOSIS — K810 Acute cholecystitis: Secondary | ICD-10-CM

## 2015-06-07 ENCOUNTER — Other Ambulatory Visit: Payer: Self-pay | Admitting: Surgery

## 2015-06-07 ENCOUNTER — Other Ambulatory Visit (HOSPITAL_COMMUNITY): Payer: Self-pay | Admitting: Interventional Radiology

## 2015-06-07 ENCOUNTER — Ambulatory Visit
Admission: RE | Admit: 2015-06-07 | Discharge: 2015-06-07 | Disposition: A | Discharge status: Home / Self Care | Payer: Medicare Other | Source: Ambulatory Visit | Attending: Interventional Radiology | Admitting: Interventional Radiology

## 2015-06-07 ENCOUNTER — Ambulatory Visit
Admission: RE | Admit: 2015-06-07 | Discharge: 2015-06-07 | Disposition: A | Discharge status: Home / Self Care | Payer: Medicare Other | Source: Ambulatory Visit | Attending: Surgery | Admitting: Surgery

## 2015-06-07 DIAGNOSIS — K810 Acute cholecystitis: Secondary | ICD-10-CM

## 2015-06-07 DIAGNOSIS — K8000 Calculus of gallbladder with acute cholecystitis without obstruction: Secondary | ICD-10-CM | POA: Diagnosis not present

## 2015-06-07 DIAGNOSIS — K819 Cholecystitis, unspecified: Secondary | ICD-10-CM | POA: Diagnosis not present
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
## 2015-06-22 ENCOUNTER — Ambulatory Visit
Admission: RE | Admit: 2015-06-22 | Discharge: 2015-06-22 | Disposition: A | Discharge status: Home / Self Care | Payer: Medicare Other | Source: Ambulatory Visit | Attending: Surgery | Admitting: Surgery

## 2015-06-22 ENCOUNTER — Ambulatory Visit
Admission: RE | Admit: 2015-06-22 | Discharge: 2015-06-22 | Disposition: A | Discharge status: Home / Self Care | Payer: Medicare Other | Source: Ambulatory Visit | Attending: Interventional Radiology | Admitting: Interventional Radiology

## 2015-06-22 DIAGNOSIS — K810 Acute cholecystitis: Secondary | ICD-10-CM
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
## 2015-06-27 DIAGNOSIS — K810 Acute cholecystitis: Secondary | ICD-10-CM | POA: Diagnosis not present

## 2015-06-29 ENCOUNTER — Other Ambulatory Visit (HOSPITAL_COMMUNITY): Payer: Self-pay | Admitting: Interventional Radiology

## 2015-06-29 DIAGNOSIS — K810 Acute cholecystitis: Secondary | ICD-10-CM

## 2015-07-05 ENCOUNTER — Other Ambulatory Visit: Payer: Medicare Other

## 2015-07-05 ENCOUNTER — Ambulatory Visit
Admission: RE | Admit: 2015-07-05 | Discharge: 2015-07-05 | Disposition: A | Discharge status: Home / Self Care | Payer: Medicare Other | Source: Ambulatory Visit | Attending: Interventional Radiology | Admitting: Interventional Radiology

## 2015-07-05 DIAGNOSIS — K810 Acute cholecystitis: Secondary | ICD-10-CM | POA: Diagnosis not present
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
## 2015-07-21 ENCOUNTER — Encounter: Payer: Self-pay | Admitting: Interventional Cardiology

## 2015-07-21 ENCOUNTER — Ambulatory Visit (INDEPENDENT_AMBULATORY_CARE_PROVIDER_SITE_OTHER): Payer: Medicare Other | Admitting: Interventional Cardiology

## 2015-07-21 VITALS — BP 130/70 | HR 72 | Ht 68.0 in | Wt 174.0 lb

## 2015-07-21 DIAGNOSIS — E782 Mixed hyperlipidemia: Secondary | ICD-10-CM

## 2015-07-21 DIAGNOSIS — K810 Acute cholecystitis: Secondary | ICD-10-CM

## 2015-07-21 DIAGNOSIS — Z23 Encounter for immunization: Secondary | ICD-10-CM

## 2015-07-21 DIAGNOSIS — I252 Old myocardial infarction: Secondary | ICD-10-CM | POA: Diagnosis not present

## 2015-07-21 DIAGNOSIS — I059 Rheumatic mitral valve disease, unspecified: Secondary | ICD-10-CM | POA: Diagnosis not present

## 2015-07-21 DIAGNOSIS — I2510 Atherosclerotic heart disease of native coronary artery without angina pectoris: Secondary | ICD-10-CM

## 2015-07-21 DIAGNOSIS — I255 Ischemic cardiomyopathy: Secondary | ICD-10-CM | POA: Diagnosis not present

## 2015-07-21 DIAGNOSIS — E1159 Type 2 diabetes mellitus with other circulatory complications: Secondary | ICD-10-CM

## 2015-07-21 NOTE — Patient Instructions (Signed)
**Note De-identified  Obfuscation** Medication Instructions:  Same-no changes  Labwork: None  Testing/Procedures: None  Follow-Up: Your physician wants you to follow-up in: 6 months. You will receive a reminder letter in the mail two months in advance. If you don't receive a letter, please call our office to schedule the follow-up appointment.      If you need a refill on your cardiac medications before your next appointment, please call your pharmacy.   
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
## 2015-07-27 ENCOUNTER — Other Ambulatory Visit: Payer: Self-pay | Admitting: Interventional Cardiology

## 2015-08-01 ENCOUNTER — Ambulatory Visit (INDEPENDENT_AMBULATORY_CARE_PROVIDER_SITE_OTHER): Payer: Medicare Other | Admitting: Ophthalmology

## 2015-08-01 DIAGNOSIS — H353111 Nonexudative age-related macular degeneration, right eye, early dry stage: Secondary | ICD-10-CM | POA: Diagnosis not present

## 2015-08-01 DIAGNOSIS — E11319 Type 2 diabetes mellitus with unspecified diabetic retinopathy without macular edema: Secondary | ICD-10-CM

## 2015-08-01 DIAGNOSIS — H353122 Nonexudative age-related macular degeneration, left eye, intermediate dry stage: Secondary | ICD-10-CM

## 2015-08-01 DIAGNOSIS — I10 Essential (primary) hypertension: Secondary | ICD-10-CM | POA: Diagnosis not present

## 2015-08-01 DIAGNOSIS — E113293 Type 2 diabetes mellitus with mild nonproliferative diabetic retinopathy without macular edema, bilateral: Secondary | ICD-10-CM | POA: Diagnosis not present

## 2015-08-01 DIAGNOSIS — H35033 Hypertensive retinopathy, bilateral: Secondary | ICD-10-CM

## 2015-08-01 DIAGNOSIS — H2513 Age-related nuclear cataract, bilateral: Secondary | ICD-10-CM | POA: Diagnosis not present

## 2015-08-01 DIAGNOSIS — H43813 Vitreous degeneration, bilateral: Secondary | ICD-10-CM | POA: Diagnosis not present

## 2015-08-03 ENCOUNTER — Other Ambulatory Visit: Payer: PRIVATE HEALTH INSURANCE

## 2015-08-04 DIAGNOSIS — Z794 Long term (current) use of insulin: Secondary | ICD-10-CM | POA: Diagnosis not present

## 2015-08-04 DIAGNOSIS — N189 Chronic kidney disease, unspecified: Secondary | ICD-10-CM | POA: Diagnosis not present

## 2015-08-04 DIAGNOSIS — E1121 Type 2 diabetes mellitus with diabetic nephropathy: Secondary | ICD-10-CM | POA: Diagnosis not present

## 2015-08-04 DIAGNOSIS — G4700 Insomnia, unspecified: Secondary | ICD-10-CM | POA: Diagnosis not present

## 2015-08-04 DIAGNOSIS — Z23 Encounter for immunization: Secondary | ICD-10-CM | POA: Diagnosis not present

## 2015-08-05 ENCOUNTER — Ambulatory Visit (INDEPENDENT_AMBULATORY_CARE_PROVIDER_SITE_OTHER): Payer: Medicare Other | Admitting: Endocrinology

## 2015-08-05 ENCOUNTER — Encounter: Payer: Self-pay | Admitting: Endocrinology

## 2015-08-05 VITALS — BP 122/52 | HR 59 | Temp 98.5°F | Resp 14 | Ht 68.0 in | Wt 174.8 lb

## 2015-08-05 DIAGNOSIS — N184 Chronic kidney disease, stage 4 (severe): Secondary | ICD-10-CM

## 2015-08-05 DIAGNOSIS — IMO0002 Reserved for concepts with insufficient information to code with codable children: Secondary | ICD-10-CM

## 2015-08-05 DIAGNOSIS — E1165 Type 2 diabetes mellitus with hyperglycemia: Secondary | ICD-10-CM

## 2015-08-05 DIAGNOSIS — E1121 Type 2 diabetes mellitus with diabetic nephropathy: Secondary | ICD-10-CM

## 2015-08-05 DIAGNOSIS — I255 Ischemic cardiomyopathy: Secondary | ICD-10-CM | POA: Diagnosis not present

## 2015-08-05 DIAGNOSIS — E1122 Type 2 diabetes mellitus with diabetic chronic kidney disease: Secondary | ICD-10-CM | POA: Diagnosis not present

## 2015-08-05 DIAGNOSIS — Z794 Long term (current) use of insulin: Secondary | ICD-10-CM | POA: Diagnosis not present

## 2015-08-05 LAB — COMPREHENSIVE METABOLIC PANEL
ALK PHOS: 45 U/L (ref 39–117)
ALT: 13 U/L (ref 0–53)
AST: 19 U/L (ref 0–37)
Albumin: 4.3 g/dL (ref 3.5–5.2)
BUN: 23 mg/dL (ref 6–23)
CHLORIDE: 108 meq/L (ref 96–112)
CO2: 25 mEq/L (ref 19–32)
Calcium: 9.8 mg/dL (ref 8.4–10.5)
Creatinine, Ser: 1.86 mg/dL — ABNORMAL HIGH (ref 0.40–1.50)
GFR: 36.89 mL/min — AB (ref >60.00)
GLUCOSE: 140 mg/dL — AB (ref 70–99)
POTASSIUM: 5.1 meq/L (ref 3.5–5.1)
SODIUM: 141 meq/L (ref 135–145)
TOTAL PROTEIN: 6.8 g/dL (ref 6.0–8.3)
Total Bilirubin: 1.3 mg/dL — ABNORMAL HIGH (ref 0.2–1.2)

## 2015-08-05 LAB — LIPID PANEL
Cholesterol: 146 mg/dL (ref 0–200)
HDL: 61.8 mg/dL (ref >39.00)
LDL CALC: 73 mg/dL (ref 0–99)
NONHDL: 83.78
Total CHOL/HDL Ratio: 2
Triglycerides: 55 mg/dL (ref 0.0–149.0)
VLDL: 11 mg/dL (ref 0.0–40.0)

## 2015-08-05 LAB — POCT GLYCOSYLATED HEMOGLOBIN (HGB A1C): HEMOGLOBIN A1C: 5.9

## 2015-08-05 NOTE — Patient Instructions (Signed)
Check blood sugars on waking up   times a week Also check blood sugars about 2 hours after a meal and do this after different meals by rotation  Recommended blood sugar levels on waking up is 100-140 and about 2 hours after meal is 130-180  Please bring your blood sugar monitor to each visit, thank you  LANTUS INSULIN 12 units DAILY IN  AM AND DO  NOT CHANGE BASED ON SUGAR
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
## 2015-08-08 NOTE — Progress Notes (Signed)
Quick Note:  Please let patient know that the kidney interest is slightly better, cholesterol okay Also send to PCP and nephrologist ______

## 2015-08-09 DIAGNOSIS — N2581 Secondary hyperparathyroidism of renal origin: Secondary | ICD-10-CM | POA: Diagnosis not present

## 2015-08-09 DIAGNOSIS — N184 Chronic kidney disease, stage 4 (severe): Secondary | ICD-10-CM | POA: Diagnosis not present

## 2015-08-09 DIAGNOSIS — D631 Anemia in chronic kidney disease: Secondary | ICD-10-CM | POA: Diagnosis not present

## 2015-08-09 DIAGNOSIS — I129 Hypertensive chronic kidney disease with stage 1 through stage 4 chronic kidney disease, or unspecified chronic kidney disease: Secondary | ICD-10-CM | POA: Diagnosis not present

## 2015-08-09 DIAGNOSIS — N189 Chronic kidney disease, unspecified: Secondary | ICD-10-CM | POA: Diagnosis not present

## 2015-10-28 ENCOUNTER — Other Ambulatory Visit: Payer: Self-pay | Admitting: Interventional Cardiology

## 2015-11-04 ENCOUNTER — Ambulatory Visit (INDEPENDENT_AMBULATORY_CARE_PROVIDER_SITE_OTHER): Payer: Medicare Other | Admitting: Endocrinology

## 2015-11-04 ENCOUNTER — Encounter: Payer: Self-pay | Admitting: Endocrinology

## 2015-11-04 DIAGNOSIS — E1129 Type 2 diabetes mellitus with other diabetic kidney complication: Secondary | ICD-10-CM

## 2015-11-04 LAB — POCT GLYCOSYLATED HEMOGLOBIN (HGB A1C): Hemoglobin A1C: 7

## 2015-11-04 MED ORDER — GLUCOSE BLOOD VI STRP: ORAL_STRIP | Status: DC

## 2015-11-04 NOTE — Progress Notes (Signed)
Pre visit review using our clinic review tool, if applicable. No additional management support is needed unless otherwise documented below in the visit note. 
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
## 2015-11-04 NOTE — Patient Instructions (Signed)
Change lantus to dinner time and start taking 18 units daily  Check blood sugars on waking up   times a week Also check blood sugars about 2 hours after a meal and do this after different meals by rotation  Recommended blood sugar levels on waking up is 90-130 and about 2 hours after meal is 130-160  Please bring your blood sugar monitor to each visit, thank you  Must take Novolog even with normal sugars

## 2015-11-08 DIAGNOSIS — N2581 Secondary hyperparathyroidism of renal origin: Secondary | ICD-10-CM | POA: Diagnosis not present

## 2015-11-08 DIAGNOSIS — N184 Chronic kidney disease, stage 4 (severe): Secondary | ICD-10-CM | POA: Diagnosis not present

## 2015-11-08 DIAGNOSIS — N189 Chronic kidney disease, unspecified: Secondary | ICD-10-CM | POA: Diagnosis not present

## 2015-11-15 DIAGNOSIS — N2581 Secondary hyperparathyroidism of renal origin: Secondary | ICD-10-CM | POA: Diagnosis not present

## 2015-11-15 DIAGNOSIS — N184 Chronic kidney disease, stage 4 (severe): Secondary | ICD-10-CM | POA: Diagnosis not present

## 2015-11-15 DIAGNOSIS — D631 Anemia in chronic kidney disease: Secondary | ICD-10-CM | POA: Diagnosis not present

## 2015-11-15 DIAGNOSIS — I129 Hypertensive chronic kidney disease with stage 1 through stage 4 chronic kidney disease, or unspecified chronic kidney disease: Secondary | ICD-10-CM | POA: Diagnosis not present

## 2015-11-21 ENCOUNTER — Encounter: Payer: Medicare Other | Admitting: Nutrition

## 2015-11-21 ENCOUNTER — Encounter: Payer: Medicare Other | Attending: Endocrinology | Admitting: Nutrition

## 2015-11-21 DIAGNOSIS — Z029 Encounter for administrative examinations, unspecified: Secondary | ICD-10-CM | POA: Diagnosis not present

## 2015-11-21 DIAGNOSIS — E1129 Type 2 diabetes mellitus with other diabetic kidney complication: Secondary | ICD-10-CM

## 2015-11-23 NOTE — Patient Instructions (Signed)
Read over information given on the V-go Call if questions. Call if/when you hear about the cost of this device, to let me know if you want to try this.

## 2015-11-23 NOTE — Progress Notes (Signed)
Mr. Wingerd did not want to start on the v-go until he was certain of the cost, and until he " reviewed this a little more" We reviewed the steps on how to fill, apply and use the V-go as well as the advantages of this type of therapy.  He was given a brochure and we fill out a Insurance investigation form.  He did not confirm his choice to start this at this time.  He says that he will review the information given, and after hearing from [facility], will decide what to do.  He was given my telephone number to call when he decides.  He had no final questions. Diet review shows meals are balanced, and not high in carbs--approx. 3-4 servings at each meal.  Activity is low and he did not bring his meter to review the readings.  It was suggested that he test his blood sugar 2 hours after a meal, and if over 160, he should consider this V-Go device.  He was told that he could have a trial for 6 days at no charge.  He had no final questions.

## 2015-11-25 ENCOUNTER — Ambulatory Visit: Payer: Medicare Other | Admitting: Endocrinology

## 2015-12-23 ENCOUNTER — Other Ambulatory Visit: Payer: Self-pay | Admitting: Interventional Cardiology

## 2015-12-27 DIAGNOSIS — C61 Malignant neoplasm of prostate: Secondary | ICD-10-CM | POA: Diagnosis not present

## 2015-12-27 DIAGNOSIS — Z0000 Encounter for general adult medical examination without abnormal findings: Secondary | ICD-10-CM | POA: Diagnosis not present

## 2016-01-18 DIAGNOSIS — N2581 Secondary hyperparathyroidism of renal origin: Secondary | ICD-10-CM | POA: Diagnosis not present

## 2016-01-18 DIAGNOSIS — N189 Chronic kidney disease, unspecified: Secondary | ICD-10-CM | POA: Diagnosis not present

## 2016-01-18 DIAGNOSIS — N184 Chronic kidney disease, stage 4 (severe): Secondary | ICD-10-CM | POA: Diagnosis not present

## 2016-01-23 DIAGNOSIS — I129 Hypertensive chronic kidney disease with stage 1 through stage 4 chronic kidney disease, or unspecified chronic kidney disease: Secondary | ICD-10-CM | POA: Diagnosis not present

## 2016-01-23 DIAGNOSIS — D631 Anemia in chronic kidney disease: Secondary | ICD-10-CM | POA: Diagnosis not present

## 2016-01-23 DIAGNOSIS — N2581 Secondary hyperparathyroidism of renal origin: Secondary | ICD-10-CM | POA: Diagnosis not present

## 2016-01-23 DIAGNOSIS — N184 Chronic kidney disease, stage 4 (severe): Secondary | ICD-10-CM | POA: Diagnosis not present

## 2016-02-05 ENCOUNTER — Other Ambulatory Visit: Payer: Self-pay | Admitting: Endocrinology

## 2016-02-06 ENCOUNTER — Other Ambulatory Visit: Payer: Self-pay | Admitting: *Deleted

## 2016-02-06 MED ORDER — INSULIN GLARGINE 100 UNIT/ML SOLOSTAR PEN: PEN_INJECTOR | SUBCUTANEOUS | Status: DC

## 2016-02-07 ENCOUNTER — Ambulatory Visit (INDEPENDENT_AMBULATORY_CARE_PROVIDER_SITE_OTHER): Payer: Medicare Other | Admitting: Interventional Cardiology

## 2016-02-07 ENCOUNTER — Encounter: Payer: Self-pay | Admitting: Interventional Cardiology

## 2016-02-07 VITALS — BP 100/55 | HR 72 | Ht 73.0 in | Wt 166.0 lb

## 2016-02-07 DIAGNOSIS — I5022 Chronic systolic (congestive) heart failure: Secondary | ICD-10-CM

## 2016-02-07 DIAGNOSIS — I255 Ischemic cardiomyopathy: Secondary | ICD-10-CM | POA: Diagnosis not present

## 2016-02-07 DIAGNOSIS — E1129 Type 2 diabetes mellitus with other diabetic kidney complication: Secondary | ICD-10-CM

## 2016-02-07 DIAGNOSIS — I2510 Atherosclerotic heart disease of native coronary artery without angina pectoris: Secondary | ICD-10-CM | POA: Diagnosis not present

## 2016-02-07 DIAGNOSIS — I059 Rheumatic mitral valve disease, unspecified: Secondary | ICD-10-CM | POA: Diagnosis not present

## 2016-02-07 DIAGNOSIS — E782 Mixed hyperlipidemia: Secondary | ICD-10-CM
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
## 2016-02-07 NOTE — Patient Instructions (Addendum)
**Note De-identified  Obfuscation** Medication Instructions:  Same-no changes  Labwork: None  Testing/Procedures: None  Follow-Up: Your physician wants you to follow-up in: 6 months. You will receive a reminder letter in the mail two months in advance. If you don't receive a letter, please call our office to schedule the follow-up appointment.      If you need a refill on your cardiac medications before your next appointment, please call your pharmacy.   

## 2016-03-18 ENCOUNTER — Other Ambulatory Visit: Payer: Self-pay | Admitting: Interventional Cardiology

## 2016-03-20 ENCOUNTER — Other Ambulatory Visit: Payer: Self-pay | Admitting: *Deleted

## 2016-03-20 MED ORDER — FUROSEMIDE 40 MG PO TABS: 20.0000 mg | ORAL_TABLET | Freq: Two times a day (BID) | ORAL | Status: DC

## 2016-03-20 NOTE — Telephone Encounter (Signed)
Medication   isosorbide mononitrate (IMDUR) 30 MG 24 hr tablet [[id number]]       isosorbide mononitrate (IMDUR) 30 MG 24 hr tablet [id number] DISCONTINUED     Order Details    Dose, Route, Frequency: As Directed Order Comments:   **please call and schedule an appointment**      Dispense Quantity:  45 tablet Refills:  0 Fills Remaining:  0          Sig: Take one-half tablet by mouth daily   Patient not taking: Reported on 05/27/2015         Discontinue Date:  07/21/2015 [place]:  Freada Bergeron, CMA Discontinue Reason:  Discontinued by provider   Written Date:  05/17/15 Expiration Date:  05/16/16     Start Date:  05/17/15 End Date:  07/21/15     Ordering Provider:  Jettie Booze, MD Authorizing Provider:  Jettie Booze, MD Ordering User:  Juventino Slovak, CMA                    Original Order:  isosorbide mononitrate (IMDUR) 30 MG 24 hr tablet [id number]        Pharmacy:  [place], [place] Comments:  --

## 2016-04-05 ENCOUNTER — Other Ambulatory Visit: Payer: Self-pay | Admitting: Endocrinology

## 2016-04-16 ENCOUNTER — Other Ambulatory Visit: Payer: Self-pay | Admitting: Endocrinology

## 2016-04-17 ENCOUNTER — Other Ambulatory Visit: Payer: Self-pay

## 2016-04-17 MED ORDER — INSULIN PEN NEEDLE 32G X 4 MM MISC: 0 refills | Status: DC

## 2016-04-17 NOTE — Telephone Encounter (Signed)
Patient called asking when you needed to see them for their next visit. I looked through the last note and did not see anything. I can schedule patient for when you would like to see him next.   Please advise, thank you!

## 2016-04-17 NOTE — Telephone Encounter (Signed)
PT needs pen needles sent into [facility].  Also would like to know when he needs to be scheduled to come back in to see Dr. Dwyane Dee.

## 2016-04-30 ENCOUNTER — Other Ambulatory Visit: Payer: Self-pay | Admitting: Interventional Cardiology

## 2016-05-16 ENCOUNTER — Ambulatory Visit (INDEPENDENT_AMBULATORY_CARE_PROVIDER_SITE_OTHER): Payer: Medicare Other | Admitting: Endocrinology

## 2016-05-16 ENCOUNTER — Other Ambulatory Visit: Payer: Self-pay

## 2016-05-16 ENCOUNTER — Encounter: Payer: Self-pay | Admitting: Endocrinology

## 2016-05-16 VITALS — BP 118/70 | HR 85 | Ht 73.0 in | Wt 172.0 lb

## 2016-05-16 DIAGNOSIS — E782 Mixed hyperlipidemia: Secondary | ICD-10-CM

## 2016-05-16 DIAGNOSIS — E1165 Type 2 diabetes mellitus with hyperglycemia: Secondary | ICD-10-CM

## 2016-05-16 DIAGNOSIS — N184 Chronic kidney disease, stage 4 (severe): Secondary | ICD-10-CM

## 2016-05-16 DIAGNOSIS — E1122 Type 2 diabetes mellitus with diabetic chronic kidney disease: Secondary | ICD-10-CM

## 2016-05-16 DIAGNOSIS — Z794 Long term (current) use of insulin: Secondary | ICD-10-CM

## 2016-05-16 DIAGNOSIS — I255 Ischemic cardiomyopathy: Secondary | ICD-10-CM | POA: Diagnosis not present

## 2016-05-16 LAB — COMPREHENSIVE METABOLIC PANEL
ALK PHOS: 49 U/L (ref 39–117)
ALT: 14 U/L (ref 0–53)
AST: 22 U/L (ref 0–37)
Albumin: 4.7 g/dL (ref 3.5–5.2)
BILIRUBIN TOTAL: 1 mg/dL (ref 0.2–1.2)
BUN: 44 mg/dL — AB (ref 6–23)
CO2: 30 mEq/L (ref 19–32)
Calcium: 9.9 mg/dL (ref 8.4–10.5)
Chloride: 104 mEq/L (ref 96–112)
Creatinine, Ser: 2.47 mg/dL — ABNORMAL HIGH (ref 0.40–1.50)
GFR: 26.54 mL/min — AB (ref >60.00)
GLUCOSE: 180 mg/dL — AB (ref 70–99)
Potassium: 4.9 mEq/L (ref 3.5–5.1)
Sodium: 139 mEq/L (ref 135–145)
TOTAL PROTEIN: 7.3 g/dL (ref 6.0–8.3)

## 2016-05-16 LAB — LIPID PANEL
Cholesterol: 146 mg/dL (ref 0–200)
HDL: 73.9 mg/dL (ref >39.00)
LDL CALC: 58 mg/dL (ref 0–99)
NONHDL: 72.29
Total CHOL/HDL Ratio: 2
Triglycerides: 69 mg/dL (ref 0.0–149.0)
VLDL: 13.8 mg/dL (ref 0.0–40.0)

## 2016-05-16 LAB — MICROALBUMIN / CREATININE URINE RATIO
Creatinine,U: 91.8 mg/dL
MICROALB/CREAT RATIO: 6.8 mg/g (ref 0.0–30.0)
Microalb, Ur: 6.2 mg/dL — ABNORMAL HIGH (ref 0.0–1.9)

## 2016-05-16 LAB — POCT GLYCOSYLATED HEMOGLOBIN (HGB A1C): HEMOGLOBIN A1C: 7.5

## 2016-05-16 MED ORDER — INSULIN GLARGINE 100 UNIT/ML SOLOSTAR PEN: PEN_INJECTOR | SUBCUTANEOUS | 2 refills | Status: DC

## 2016-05-16 MED ORDER — INSULIN LISPRO 100 UNIT/ML (KWIKPEN): 8.0000 [IU] | PEN_INJECTOR | Freq: Three times a day (TID) | SUBCUTANEOUS | 2 refills | Status: DC

## 2016-05-16 NOTE — Addendum Note (Signed)
Addended by: Nile Riggs on: 05/16/2016 01:24 PM   Modules accepted: Orders
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
## 2016-05-16 NOTE — Patient Instructions (Addendum)
LANTUS insulin: This is once a day and provides a base for you insulin requirement throughout the day. Do not take this tonight and start taking 17 units in the morning from tomorrow  HUMALOG insulin: Take this right before eating every meal  Take 9 units before every meal regardless of the blood sugar as you need to cover the food with this insulin Add extra unit of Humalog at mealtime if the blood sugars are around 200 and extra 2 units if they are around 250

## 2016-05-29 ENCOUNTER — Other Ambulatory Visit: Payer: Self-pay | Admitting: Endocrinology

## 2016-06-07 DIAGNOSIS — N2581 Secondary hyperparathyroidism of renal origin: Secondary | ICD-10-CM | POA: Diagnosis not present

## 2016-06-07 DIAGNOSIS — N184 Chronic kidney disease, stage 4 (severe): Secondary | ICD-10-CM | POA: Diagnosis not present

## 2016-06-07 DIAGNOSIS — N189 Chronic kidney disease, unspecified: Secondary | ICD-10-CM | POA: Diagnosis not present

## 2016-06-12 ENCOUNTER — Other Ambulatory Visit: Payer: Self-pay | Admitting: Endocrinology

## 2016-06-12 DIAGNOSIS — N2581 Secondary hyperparathyroidism of renal origin: Secondary | ICD-10-CM | POA: Diagnosis not present

## 2016-06-12 DIAGNOSIS — N184 Chronic kidney disease, stage 4 (severe): Secondary | ICD-10-CM | POA: Diagnosis not present

## 2016-06-12 DIAGNOSIS — D631 Anemia in chronic kidney disease: Secondary | ICD-10-CM | POA: Diagnosis not present

## 2016-06-12 DIAGNOSIS — I129 Hypertensive chronic kidney disease with stage 1 through stage 4 chronic kidney disease, or unspecified chronic kidney disease: Secondary | ICD-10-CM | POA: Diagnosis not present

## 2016-06-13 ENCOUNTER — Ambulatory Visit (INDEPENDENT_AMBULATORY_CARE_PROVIDER_SITE_OTHER): Payer: Medicare Other | Admitting: Endocrinology

## 2016-06-13 ENCOUNTER — Encounter: Payer: Self-pay | Admitting: Endocrinology

## 2016-06-13 VITALS — BP 124/66 | HR 80 | Temp 98.3°F | Resp 14 | Ht 73.0 in | Wt 175.6 lb

## 2016-06-13 DIAGNOSIS — I255 Ischemic cardiomyopathy: Secondary | ICD-10-CM

## 2016-06-13 DIAGNOSIS — Z794 Long term (current) use of insulin: Secondary | ICD-10-CM

## 2016-06-13 DIAGNOSIS — Z23 Encounter for immunization: Secondary | ICD-10-CM | POA: Diagnosis not present

## 2016-06-13 DIAGNOSIS — E1165 Type 2 diabetes mellitus with hyperglycemia: Secondary | ICD-10-CM

## 2016-06-13 NOTE — Patient Instructions (Addendum)
Take Humalog just before eating
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
## 2016-07-31 ENCOUNTER — Ambulatory Visit (INDEPENDENT_AMBULATORY_CARE_PROVIDER_SITE_OTHER): Payer: Medicare Other | Admitting: Ophthalmology

## 2016-07-31 DIAGNOSIS — H43813 Vitreous degeneration, bilateral: Secondary | ICD-10-CM | POA: Diagnosis not present

## 2016-07-31 DIAGNOSIS — H35033 Hypertensive retinopathy, bilateral: Secondary | ICD-10-CM | POA: Diagnosis not present

## 2016-07-31 DIAGNOSIS — H353131 Nonexudative age-related macular degeneration, bilateral, early dry stage: Secondary | ICD-10-CM

## 2016-07-31 DIAGNOSIS — E113393 Type 2 diabetes mellitus with moderate nonproliferative diabetic retinopathy without macular edema, bilateral: Secondary | ICD-10-CM

## 2016-07-31 DIAGNOSIS — I10 Essential (primary) hypertension: Secondary | ICD-10-CM | POA: Diagnosis not present

## 2016-07-31 DIAGNOSIS — E11319 Type 2 diabetes mellitus with unspecified diabetic retinopathy without macular edema: Secondary | ICD-10-CM | POA: Diagnosis not present
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
## 2016-08-02 ENCOUNTER — Encounter: Payer: Self-pay | Admitting: Interventional Cardiology

## 2016-08-02 ENCOUNTER — Ambulatory Visit (INDEPENDENT_AMBULATORY_CARE_PROVIDER_SITE_OTHER): Payer: Medicare Other | Admitting: Interventional Cardiology

## 2016-08-02 VITALS — BP 110/50 | HR 57 | Ht 69.0 in | Wt 180.1 lb

## 2016-08-02 DIAGNOSIS — I255 Ischemic cardiomyopathy: Secondary | ICD-10-CM

## 2016-08-02 DIAGNOSIS — I059 Rheumatic mitral valve disease, unspecified: Secondary | ICD-10-CM | POA: Diagnosis not present

## 2016-08-02 DIAGNOSIS — I2510 Atherosclerotic heart disease of native coronary artery without angina pectoris: Secondary | ICD-10-CM | POA: Diagnosis not present

## 2016-08-02 DIAGNOSIS — I5022 Chronic systolic (congestive) heart failure: Secondary | ICD-10-CM

## 2016-08-02 NOTE — Patient Instructions (Signed)
**Note De-identified  Obfuscation** Medication Instructions:  Same-no changes  Labwork: None  Testing/Procedures: None  Follow-Up: Your physician wants you to follow-up in: 6 months. You will receive a reminder letter in the mail two months in advance. If you don't receive a letter, please call our office to schedule the follow-up appointment.      If you need a refill on your cardiac medications before your next appointment, please call your pharmacy.   

## 2016-08-29 DIAGNOSIS — N189 Chronic kidney disease, unspecified: Secondary | ICD-10-CM | POA: Diagnosis not present

## 2016-08-29 DIAGNOSIS — I255 Ischemic cardiomyopathy: Secondary | ICD-10-CM | POA: Diagnosis not present

## 2016-08-29 DIAGNOSIS — I25119 Atherosclerotic heart disease of native coronary artery with unspecified angina pectoris: Secondary | ICD-10-CM | POA: Diagnosis not present

## 2016-08-29 DIAGNOSIS — Z8546 Personal history of malignant neoplasm of prostate: Secondary | ICD-10-CM | POA: Diagnosis not present

## 2016-08-29 DIAGNOSIS — I10 Essential (primary) hypertension: Secondary | ICD-10-CM | POA: Diagnosis not present

## 2016-08-29 DIAGNOSIS — E1121 Type 2 diabetes mellitus with diabetic nephropathy: Secondary | ICD-10-CM | POA: Diagnosis not present

## 2016-08-29 DIAGNOSIS — Z794 Long term (current) use of insulin: Secondary | ICD-10-CM | POA: Diagnosis not present

## 2016-09-09 ENCOUNTER — Other Ambulatory Visit: Payer: Self-pay | Admitting: Endocrinology

## 2016-09-09 DIAGNOSIS — Z794 Long term (current) use of insulin: Principal | ICD-10-CM

## 2016-09-09 DIAGNOSIS — E1165 Type 2 diabetes mellitus with hyperglycemia: Secondary | ICD-10-CM

## 2016-09-10 ENCOUNTER — Other Ambulatory Visit (INDEPENDENT_AMBULATORY_CARE_PROVIDER_SITE_OTHER): Payer: Medicare Other

## 2016-09-10 DIAGNOSIS — Z794 Long term (current) use of insulin: Secondary | ICD-10-CM | POA: Diagnosis not present

## 2016-09-10 DIAGNOSIS — E1165 Type 2 diabetes mellitus with hyperglycemia: Secondary | ICD-10-CM | POA: Diagnosis not present

## 2016-09-10 LAB — BASIC METABOLIC PANEL
BUN: 35 mg/dL — AB (ref 6–23)
CALCIUM: 9.5 mg/dL (ref 8.4–10.5)
CO2: 28 mEq/L (ref 19–32)
CREATININE: 2.4 mg/dL — AB (ref 0.40–1.50)
Chloride: 103 mEq/L (ref 96–112)
GFR: 27.41 mL/min — AB (ref >60.00)
Glucose, Bld: 256 mg/dL — ABNORMAL HIGH (ref 70–99)
Potassium: 4.9 mEq/L (ref 3.5–5.1)
Sodium: 139 mEq/L (ref 135–145)

## 2016-09-10 LAB — HEMOGLOBIN A1C: Hgb A1c MFr Bld: 7.1 % — ABNORMAL HIGH (ref 4.6–6.5)

## 2016-09-14 ENCOUNTER — Ambulatory Visit (INDEPENDENT_AMBULATORY_CARE_PROVIDER_SITE_OTHER): Payer: Medicare Other | Admitting: Endocrinology

## 2016-09-14 ENCOUNTER — Encounter: Payer: Self-pay | Admitting: Endocrinology

## 2016-09-14 VITALS — BP 118/50 | HR 74 | Temp 97.7°F | Ht 70.0 in | Wt 176.4 lb

## 2016-09-14 DIAGNOSIS — Z794 Long term (current) use of insulin: Secondary | ICD-10-CM

## 2016-09-14 DIAGNOSIS — I255 Ischemic cardiomyopathy: Secondary | ICD-10-CM

## 2016-09-14 DIAGNOSIS — E1122 Type 2 diabetes mellitus with diabetic chronic kidney disease: Secondary | ICD-10-CM

## 2016-09-14 DIAGNOSIS — E1165 Type 2 diabetes mellitus with hyperglycemia: Secondary | ICD-10-CM | POA: Diagnosis not present

## 2016-09-14 DIAGNOSIS — N184 Chronic kidney disease, stage 4 (severe): Secondary | ICD-10-CM | POA: Diagnosis not present
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
## 2016-09-14 NOTE — Patient Instructions (Signed)
Take snack if being very active after lunch  Humalog 10-11 at supper if eating full meal with starches

## 2016-10-03 DIAGNOSIS — N2581 Secondary hyperparathyroidism of renal origin: Secondary | ICD-10-CM | POA: Diagnosis not present

## 2016-10-03 DIAGNOSIS — N184 Chronic kidney disease, stage 4 (severe): Secondary | ICD-10-CM | POA: Diagnosis not present

## 2016-10-30 DIAGNOSIS — N184 Chronic kidney disease, stage 4 (severe): Secondary | ICD-10-CM | POA: Diagnosis not present

## 2016-10-30 DIAGNOSIS — I129 Hypertensive chronic kidney disease with stage 1 through stage 4 chronic kidney disease, or unspecified chronic kidney disease: Secondary | ICD-10-CM | POA: Diagnosis not present

## 2016-10-30 DIAGNOSIS — D631 Anemia in chronic kidney disease: Secondary | ICD-10-CM | POA: Diagnosis not present

## 2016-10-30 DIAGNOSIS — N2581 Secondary hyperparathyroidism of renal origin: Secondary | ICD-10-CM | POA: Diagnosis not present

## 2016-11-28 ENCOUNTER — Other Ambulatory Visit: Payer: Self-pay | Admitting: Endocrinology

## 2016-12-13 ENCOUNTER — Other Ambulatory Visit (INDEPENDENT_AMBULATORY_CARE_PROVIDER_SITE_OTHER): Payer: Medicare Other

## 2016-12-13 DIAGNOSIS — Z794 Long term (current) use of insulin: Secondary | ICD-10-CM

## 2016-12-13 DIAGNOSIS — E1165 Type 2 diabetes mellitus with hyperglycemia: Secondary | ICD-10-CM | POA: Diagnosis not present

## 2016-12-13 LAB — COMPREHENSIVE METABOLIC PANEL
ALK PHOS: 40 U/L (ref 39–117)
ALT: 13 U/L (ref 0–53)
AST: 20 U/L (ref 0–37)
Albumin: 4.2 g/dL (ref 3.5–5.2)
BUN: 37 mg/dL — AB (ref 6–23)
CO2: 29 mEq/L (ref 19–32)
Calcium: 10.1 mg/dL (ref 8.4–10.5)
Chloride: 106 mEq/L (ref 96–112)
Creatinine, Ser: 2.35 mg/dL — ABNORMAL HIGH (ref 0.40–1.50)
GFR: 28.07 mL/min — ABNORMAL LOW (ref >60.00)
Glucose, Bld: 158 mg/dL — ABNORMAL HIGH (ref 70–99)
Potassium: 4.8 mEq/L (ref 3.5–5.1)
SODIUM: 141 meq/L (ref 135–145)
TOTAL PROTEIN: 6.5 g/dL (ref 6.0–8.3)
Total Bilirubin: 1 mg/dL (ref 0.2–1.2)

## 2016-12-13 LAB — HEMOGLOBIN A1C: HEMOGLOBIN A1C: 7.6 % — AB (ref 4.6–6.5)

## 2016-12-14 LAB — FRUCTOSAMINE: FRUCTOSAMINE: 312 umol/L — AB (ref 0–285)

## 2016-12-17 ENCOUNTER — Other Ambulatory Visit: Payer: Self-pay | Admitting: Endocrinology

## 2016-12-18 ENCOUNTER — Ambulatory Visit (INDEPENDENT_AMBULATORY_CARE_PROVIDER_SITE_OTHER): Payer: Medicare Other | Admitting: Endocrinology

## 2016-12-18 ENCOUNTER — Encounter: Payer: Self-pay | Admitting: Endocrinology

## 2016-12-18 VITALS — BP 104/60 | HR 72 | Ht 70.0 in | Wt 184.0 lb

## 2016-12-18 DIAGNOSIS — E1165 Type 2 diabetes mellitus with hyperglycemia: Secondary | ICD-10-CM

## 2016-12-18 DIAGNOSIS — N184 Chronic kidney disease, stage 4 (severe): Secondary | ICD-10-CM | POA: Diagnosis not present

## 2016-12-18 DIAGNOSIS — E1122 Type 2 diabetes mellitus with diabetic chronic kidney disease: Secondary | ICD-10-CM | POA: Diagnosis not present

## 2016-12-18 DIAGNOSIS — Z794 Long term (current) use of insulin: Secondary | ICD-10-CM | POA: Diagnosis not present
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
## 2016-12-18 NOTE — Patient Instructions (Addendum)
16 LANTUS IN AM DAILY    Humalog 9/10 IN AM;   Lunch:  Take 7-8 units  SUPPER TAKE 10 UNITS unless eating a light meal  Check blood sugars on waking up  3-4 x weekly  Also check blood sugars about 2 hours after a meal and do this after different meals by rotation  Recommended blood sugar levels on waking up is 90-130 and about 2 hours after meal is 130-160  Please bring your blood sugar monitor to each visit, thank you

## 2017-01-16 ENCOUNTER — Other Ambulatory Visit: Payer: Self-pay | Admitting: Endocrinology

## 2017-01-22 ENCOUNTER — Encounter: Payer: Self-pay | Admitting: Interventional Cardiology

## 2017-02-08 ENCOUNTER — Ambulatory Visit: Payer: Medicare Other | Admitting: Interventional Cardiology

## 2017-02-09 ENCOUNTER — Other Ambulatory Visit: Payer: Self-pay | Admitting: Interventional Cardiology

## 2017-02-19 DIAGNOSIS — N2581 Secondary hyperparathyroidism of renal origin: Secondary | ICD-10-CM | POA: Diagnosis not present

## 2017-02-19 DIAGNOSIS — N189 Chronic kidney disease, unspecified: Secondary | ICD-10-CM | POA: Diagnosis not present

## 2017-02-19 DIAGNOSIS — N184 Chronic kidney disease, stage 4 (severe): Secondary | ICD-10-CM | POA: Diagnosis not present

## 2017-02-26 DIAGNOSIS — Z6825 Body mass index (BMI) 25.0-25.9, adult: Secondary | ICD-10-CM | POA: Diagnosis not present

## 2017-02-26 DIAGNOSIS — N184 Chronic kidney disease, stage 4 (severe): Secondary | ICD-10-CM | POA: Diagnosis not present

## 2017-02-26 DIAGNOSIS — I129 Hypertensive chronic kidney disease with stage 1 through stage 4 chronic kidney disease, or unspecified chronic kidney disease: Secondary | ICD-10-CM | POA: Diagnosis not present

## 2017-02-26 DIAGNOSIS — N2581 Secondary hyperparathyroidism of renal origin: Secondary | ICD-10-CM | POA: Diagnosis not present

## 2017-02-26 DIAGNOSIS — D631 Anemia in chronic kidney disease: Secondary | ICD-10-CM | POA: Diagnosis not present

## 2017-02-28 ENCOUNTER — Encounter (HOSPITAL_COMMUNITY): Payer: Self-pay

## 2017-02-28 ENCOUNTER — Inpatient Hospital Stay (HOSPITAL_COMMUNITY)
Admission: EM | Admit: 2017-02-28 | Discharge: 2017-03-05 | DRG: 871 | Disposition: A | Discharge status: Home / Self Care | Payer: Medicare Other | Attending: Nephrology | Admitting: Nephrology

## 2017-02-28 ENCOUNTER — Other Ambulatory Visit: Payer: Self-pay

## 2017-02-28 ENCOUNTER — Emergency Department (HOSPITAL_COMMUNITY): Payer: Medicare Other

## 2017-02-28 ENCOUNTER — Telehealth: Payer: Self-pay | Admitting: Interventional Cardiology

## 2017-02-28 DIAGNOSIS — I255 Ischemic cardiomyopathy: Secondary | ICD-10-CM | POA: Diagnosis present

## 2017-02-28 DIAGNOSIS — I2510 Atherosclerotic heart disease of native coronary artery without angina pectoris: Secondary | ICD-10-CM | POA: Diagnosis not present

## 2017-02-28 DIAGNOSIS — K8000 Calculus of gallbladder with acute cholecystitis without obstruction: Secondary | ICD-10-CM | POA: Diagnosis present

## 2017-02-28 DIAGNOSIS — R109 Unspecified abdominal pain: Secondary | ICD-10-CM | POA: Diagnosis not present

## 2017-02-28 DIAGNOSIS — Z8546 Personal history of malignant neoplasm of prostate: Secondary | ICD-10-CM | POA: Diagnosis not present

## 2017-02-28 DIAGNOSIS — Z79899 Other long term (current) drug therapy: Secondary | ICD-10-CM | POA: Diagnosis not present

## 2017-02-28 DIAGNOSIS — Z7902 Long term (current) use of antithrombotics/antiplatelets: Secondary | ICD-10-CM | POA: Diagnosis not present

## 2017-02-28 DIAGNOSIS — E1129 Type 2 diabetes mellitus with other diabetic kidney complication: Secondary | ICD-10-CM

## 2017-02-28 DIAGNOSIS — K819 Cholecystitis, unspecified: Secondary | ICD-10-CM

## 2017-02-28 DIAGNOSIS — K810 Acute cholecystitis: Secondary | ICD-10-CM | POA: Diagnosis not present

## 2017-02-28 DIAGNOSIS — I2583 Coronary atherosclerosis due to lipid rich plaque: Secondary | ICD-10-CM | POA: Diagnosis not present

## 2017-02-28 DIAGNOSIS — R6521 Severe sepsis with septic shock: Secondary | ICD-10-CM | POA: Diagnosis present

## 2017-02-28 DIAGNOSIS — N179 Acute kidney failure, unspecified: Secondary | ICD-10-CM | POA: Diagnosis not present

## 2017-02-28 DIAGNOSIS — I340 Nonrheumatic mitral (valve) insufficiency: Secondary | ICD-10-CM | POA: Diagnosis present

## 2017-02-28 DIAGNOSIS — I10 Essential (primary) hypertension: Secondary | ICD-10-CM | POA: Diagnosis not present

## 2017-02-28 DIAGNOSIS — N184 Chronic kidney disease, stage 4 (severe): Secondary | ICD-10-CM | POA: Diagnosis not present

## 2017-02-28 DIAGNOSIS — E7800 Pure hypercholesterolemia, unspecified: Secondary | ICD-10-CM | POA: Diagnosis present

## 2017-02-28 DIAGNOSIS — I130 Hypertensive heart and chronic kidney disease with heart failure and stage 1 through stage 4 chronic kidney disease, or unspecified chronic kidney disease: Secondary | ICD-10-CM | POA: Diagnosis present

## 2017-02-28 DIAGNOSIS — E1122 Type 2 diabetes mellitus with diabetic chronic kidney disease: Secondary | ICD-10-CM | POA: Diagnosis present

## 2017-02-28 DIAGNOSIS — I21A1 Myocardial infarction type 2: Secondary | ICD-10-CM | POA: Diagnosis present

## 2017-02-28 DIAGNOSIS — D539 Nutritional anemia, unspecified: Secondary | ICD-10-CM | POA: Diagnosis present

## 2017-02-28 DIAGNOSIS — Z923 Personal history of irradiation: Secondary | ICD-10-CM

## 2017-02-28 DIAGNOSIS — I442 Atrioventricular block, complete: Secondary | ICD-10-CM | POA: Diagnosis not present

## 2017-02-28 DIAGNOSIS — I361 Nonrheumatic tricuspid (valve) insufficiency: Secondary | ICD-10-CM | POA: Diagnosis not present

## 2017-02-28 DIAGNOSIS — R112 Nausea with vomiting, unspecified: Secondary | ICD-10-CM

## 2017-02-28 DIAGNOSIS — K529 Noninfective gastroenteritis and colitis, unspecified: Secondary | ICD-10-CM | POA: Diagnosis present

## 2017-02-28 DIAGNOSIS — K8020 Calculus of gallbladder without cholecystitis without obstruction: Secondary | ICD-10-CM | POA: Diagnosis not present

## 2017-02-28 DIAGNOSIS — A419 Sepsis, unspecified organism: Secondary | ICD-10-CM | POA: Diagnosis not present

## 2017-02-28 DIAGNOSIS — I5022 Chronic systolic (congestive) heart failure: Secondary | ICD-10-CM | POA: Diagnosis present

## 2017-02-28 DIAGNOSIS — E1151 Type 2 diabetes mellitus with diabetic peripheral angiopathy without gangrene: Secondary | ICD-10-CM | POA: Diagnosis present

## 2017-02-28 DIAGNOSIS — Z87891 Personal history of nicotine dependence: Secondary | ICD-10-CM

## 2017-02-28 DIAGNOSIS — N189 Chronic kidney disease, unspecified: Secondary | ICD-10-CM | POA: Diagnosis not present

## 2017-02-28 DIAGNOSIS — R1013 Epigastric pain: Secondary | ICD-10-CM | POA: Diagnosis not present

## 2017-02-28 DIAGNOSIS — R1011 Right upper quadrant pain: Secondary | ICD-10-CM | POA: Diagnosis not present

## 2017-02-28 DIAGNOSIS — I214 Non-ST elevation (NSTEMI) myocardial infarction: Secondary | ICD-10-CM | POA: Diagnosis not present

## 2017-02-28 DIAGNOSIS — E1165 Type 2 diabetes mellitus with hyperglycemia: Secondary | ICD-10-CM | POA: Diagnosis present

## 2017-02-28 DIAGNOSIS — Z794 Long term (current) use of insulin: Secondary | ICD-10-CM | POA: Diagnosis not present

## 2017-02-28 DIAGNOSIS — F419 Anxiety disorder, unspecified: Secondary | ICD-10-CM | POA: Diagnosis present

## 2017-02-28 DIAGNOSIS — R079 Chest pain, unspecified: Secondary | ICD-10-CM | POA: Diagnosis not present

## 2017-02-28 DIAGNOSIS — Z955 Presence of coronary angioplasty implant and graft: Secondary | ICD-10-CM

## 2017-02-28 HISTORY — DX: Hyperlipidemia, unspecified: E78.5

## 2017-02-28 HISTORY — DX: Type 2 diabetes mellitus without complications: E11.9

## 2017-02-28 LAB — CBC
HEMATOCRIT: 34.1 % — AB (ref 39.0–52.0)
Hemoglobin: 11.4 g/dL — ABNORMAL LOW (ref 13.0–17.0)
MCH: 35.2 pg — ABNORMAL HIGH (ref 26.0–34.0)
MCHC: 33.4 g/dL (ref 30.0–36.0)
MCV: 105.2 fL — ABNORMAL HIGH (ref 78.0–100.0)
PLATELETS: 383 10*3/uL (ref 150–400)
RBC: 3.24 MIL/uL — ABNORMAL LOW (ref 4.22–5.81)
RDW: 17 % — AB (ref 11.5–15.5)
WBC: 16.8 10*3/uL — ABNORMAL HIGH (ref 4.0–10.5)

## 2017-02-28 LAB — URINALYSIS, ROUTINE W REFLEX MICROSCOPIC
BACTERIA UA: NONE SEEN
BILIRUBIN URINE: NEGATIVE
Glucose, UA: >=500 mg/dL — AB
KETONES UR: NEGATIVE mg/dL
LEUKOCYTES UA: NEGATIVE
Nitrite: NEGATIVE
Protein, ur: NEGATIVE mg/dL
SPECIFIC GRAVITY, URINE: 1.007 (ref 1.005–1.030)
SQUAMOUS EPITHELIAL / LPF: NONE SEEN
WBC, UA: NONE SEEN WBC/hpf (ref 0–5)
pH: 5 (ref 5.0–8.0)

## 2017-02-28 LAB — BASIC METABOLIC PANEL
Anion gap: 14 (ref 5–15)
BUN: 37 mg/dL — AB (ref 6–20)
CALCIUM: 10.2 mg/dL (ref 8.9–10.3)
CO2: 23 mmol/L (ref 22–32)
CREATININE: 2.68 mg/dL — AB (ref 0.61–1.24)
Chloride: 101 mmol/L (ref 101–111)
GFR calc Af Amer: 23 mL/min — ABNORMAL LOW (ref >60)
GFR, EST NON AFRICAN AMERICAN: 20 mL/min — AB (ref >60)
GLUCOSE: 272 mg/dL — AB (ref 65–99)
POTASSIUM: 4 mmol/L (ref 3.5–5.1)
Sodium: 138 mmol/L (ref 135–145)

## 2017-02-28 LAB — I-STAT TROPONIN, ED
TROPONIN I, POC: 0.01 ng/mL (ref 0.00–0.08)
Troponin i, poc: 0.01 ng/mL (ref 0.00–0.08)

## 2017-02-28 LAB — LIPASE, BLOOD: Lipase: 25 U/L (ref 11–51)

## 2017-02-28 LAB — CBG MONITORING, ED: GLUCOSE-CAPILLARY: 249 mg/dL — AB (ref 65–99)

## 2017-02-28 MED ORDER — PIPERACILLIN-TAZOBACTAM 3.375 G IVPB 30 MIN
3.3750 g | Freq: Once | INTRAVENOUS | Status: AC
  Administered 2017-02-28: 3.375 g via INTRAVENOUS
  Filled 2017-02-28: qty 50

## 2017-02-28 MED ORDER — IOHEXOL 240 MG/ML SOLN: 150.0000 mL | Freq: Once | INTRAMUSCULAR | Status: DC

## 2017-02-28 MED ORDER — IOPAMIDOL (ISOVUE-300) INJECTION 61%
INTRAVENOUS | Status: AC
  Filled 2017-02-28: qty 30

## 2017-02-28 MED ORDER — ONDANSETRON 4 MG PO TBDP
4.0000 mg | ORAL_TABLET | Freq: Once | ORAL | Status: AC
  Administered 2017-02-28: 4 mg via ORAL
  Filled 2017-02-28: qty 1

## 2017-02-28 NOTE — ED Notes (Signed)
Pt states after eating a bbq sandwich is when this discomfort and pain started in the abdomin and in the lower mid epigastric chest

## 2017-02-28 NOTE — Telephone Encounter (Signed)
New message       Son says pt woke up in the middle of the night with indigestion.  He ate BBQ last night.  Pt has not felt good today.  Son thinks Dr [name] need to see pt today.  Per triag---msg sent to Dr Hassell Done nurse
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
## 2017-02-28 NOTE — ED Triage Notes (Signed)
Per pt, pt is coming from home with complaints of epigastric and chest pain that started yesterday after he ate. Pt reports belching and pain increasing through the night. Has hx of gallbladder problems. Reports some SOB, N/V/D

## 2017-02-28 NOTE — Telephone Encounter (Signed)
Patient's son calling and states that his dad ate some BBQ last night and has had indigestion. He states that when he called earlier someone told him that his father would be added to Dr. Hassell Done schedule today. Son made aware that Dr. [name] does not currently have any openings for today, but I needed more information to see if he needed to be seen in our office. I tried to ask the son what other symptoms his father was having. He states that he is having some phlegm, diarrhea, and hasn't felt well. When I tried to ask more questions the son stated that if his father could not be seen in our office today that he was not going to let him die and that he was taking him to the ER and hung up.
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
## 2017-02-28 NOTE — ED Notes (Signed)
ED Resident at bedside with patient and family

## 2017-03-01 ENCOUNTER — Inpatient Hospital Stay (HOSPITAL_COMMUNITY): Payer: Medicare Other

## 2017-03-01 ENCOUNTER — Other Ambulatory Visit: Payer: Self-pay

## 2017-03-01 ENCOUNTER — Encounter (HOSPITAL_COMMUNITY): Payer: Self-pay | Admitting: Family Medicine

## 2017-03-01 DIAGNOSIS — I21A1 Myocardial infarction type 2: Secondary | ICD-10-CM

## 2017-03-01 DIAGNOSIS — D539 Nutritional anemia, unspecified: Secondary | ICD-10-CM

## 2017-03-01 DIAGNOSIS — I2583 Coronary atherosclerosis due to lipid rich plaque: Secondary | ICD-10-CM

## 2017-03-01 DIAGNOSIS — A419 Sepsis, unspecified organism: Secondary | ICD-10-CM | POA: Diagnosis present

## 2017-03-01 DIAGNOSIS — N179 Acute kidney failure, unspecified: Secondary | ICD-10-CM

## 2017-03-01 DIAGNOSIS — I2510 Atherosclerotic heart disease of native coronary artery without angina pectoris: Secondary | ICD-10-CM

## 2017-03-01 DIAGNOSIS — K810 Acute cholecystitis: Secondary | ICD-10-CM

## 2017-03-01 DIAGNOSIS — N189 Chronic kidney disease, unspecified: Secondary | ICD-10-CM

## 2017-03-01 DIAGNOSIS — K819 Cholecystitis, unspecified: Secondary | ICD-10-CM

## 2017-03-01 LAB — HEPATIC FUNCTION PANEL
ALT: 13 U/L — AB (ref 17–63)
AST: 21 U/L (ref 15–41)
Albumin: 4.3 g/dL (ref 3.5–5.0)
Alkaline Phosphatase: 50 U/L (ref 38–126)
Bilirubin, Direct: 0.3 mg/dL (ref 0.1–0.5)
Indirect Bilirubin: 2.1 mg/dL — ABNORMAL HIGH (ref 0.3–0.9)
TOTAL PROTEIN: 7.3 g/dL (ref 6.5–8.1)
Total Bilirubin: 2.4 mg/dL — ABNORMAL HIGH (ref 0.3–1.2)

## 2017-03-01 LAB — LACTIC ACID, PLASMA
LACTIC ACID, VENOUS: 5.1 mmol/L — AB (ref 0.5–1.9)
LACTIC ACID, VENOUS: 3.6 mmol/L — AB (ref 0.5–1.9)
Lactic Acid, Venous: 2.3 mmol/L (ref 0.5–1.9)
Lactic Acid, Venous: 2.3 mmol/L (ref 0.5–1.9)

## 2017-03-01 LAB — GLUCOSE, CAPILLARY
GLUCOSE-CAPILLARY: 107 mg/dL — AB (ref 65–99)
GLUCOSE-CAPILLARY: 54 mg/dL — AB (ref 65–99)
GLUCOSE-CAPILLARY: 181 mg/dL — AB (ref 65–99)
GLUCOSE-CAPILLARY: 137 mg/dL — AB (ref 65–99)
Glucose-Capillary: 285 mg/dL — ABNORMAL HIGH (ref 65–99)
Glucose-Capillary: 232 mg/dL — ABNORMAL HIGH (ref 65–99)
Glucose-Capillary: 136 mg/dL — ABNORMAL HIGH (ref 65–99)
Glucose-Capillary: 172 mg/dL — ABNORMAL HIGH (ref 65–99)

## 2017-03-01 LAB — RETICULOCYTES
RBC.: 3.04 MIL/uL — ABNORMAL LOW (ref 4.22–5.81)
RETIC COUNT ABSOLUTE: 45.6 10*3/uL (ref 19.0–186.0)
Retic Ct Pct: 1.5 % (ref 0.4–3.1)

## 2017-03-01 LAB — COMPREHENSIVE METABOLIC PANEL
ALBUMIN: 4 g/dL (ref 3.5–5.0)
ALT: 13 U/L — ABNORMAL LOW (ref 17–63)
AST: 32 U/L (ref 15–41)
Alkaline Phosphatase: 53 U/L (ref 38–126)
Anion gap: 16 — ABNORMAL HIGH (ref 5–15)
BUN: 40 mg/dL — ABNORMAL HIGH (ref 6–20)
CO2: 24 mmol/L (ref 22–32)
Calcium: 9.3 mg/dL (ref 8.9–10.3)
Chloride: 99 mmol/L — ABNORMAL LOW (ref 101–111)
Creatinine, Ser: 2.78 mg/dL — ABNORMAL HIGH (ref 0.61–1.24)
GFR calc Af Amer: 22 mL/min — ABNORMAL LOW (ref >60)
GFR calc non Af Amer: 19 mL/min — ABNORMAL LOW (ref >60)
GLUCOSE: 300 mg/dL — AB (ref 65–99)
POTASSIUM: 4.3 mmol/L (ref 3.5–5.1)
SODIUM: 139 mmol/L (ref 135–145)
TOTAL PROTEIN: 6.7 g/dL (ref 6.5–8.1)
Total Bilirubin: 2.9 mg/dL — ABNORMAL HIGH (ref 0.3–1.2)

## 2017-03-01 LAB — CBC
HEMATOCRIT: 32.6 % — AB (ref 39.0–52.0)
Hemoglobin: 10.5 g/dL — ABNORMAL LOW (ref 13.0–17.0)
MCH: 34.5 pg — AB (ref 26.0–34.0)
MCHC: 32.2 g/dL (ref 30.0–36.0)
MCV: 107.2 fL — ABNORMAL HIGH (ref 78.0–100.0)
Platelets: 286 10*3/uL (ref 150–400)
RBC: 3.04 MIL/uL — ABNORMAL LOW (ref 4.22–5.81)
RDW: 17.2 % — ABNORMAL HIGH (ref 11.5–15.5)
WBC: 17.2 10*3/uL — ABNORMAL HIGH (ref 4.0–10.5)

## 2017-03-01 LAB — TSH: TSH: 1.666 u[IU]/mL (ref 0.350–4.500)

## 2017-03-01 LAB — TROPONIN I
TROPONIN I: 0.05 ng/mL — AB (ref <0.03)
Troponin I: 2.24 ng/mL (ref <0.03)
Troponin I: 2.47 ng/mL (ref <0.03)
Troponin I: 2.21 ng/mL (ref <0.03)

## 2017-03-01 LAB — MRSA PCR SCREENING: MRSA by PCR: NEGATIVE

## 2017-03-01 LAB — CORTISOL: Cortisol, Plasma: 26.4 ug/dL

## 2017-03-01 LAB — VITAMIN B12: Vitamin B-12: 585 pg/mL (ref 180–914)

## 2017-03-01 MED ORDER — TECHNETIUM TC 99M MEBROFENIN IV KIT
5.4300 | PACK | Freq: Once | INTRAVENOUS | Status: AC | PRN
  Administered 2017-03-01: 5.43 via INTRAVENOUS

## 2017-03-01 MED ORDER — ONDANSETRON HCL 4 MG/2ML IJ SOLN: 4.0000 mg | Freq: Four times a day (QID) | INTRAMUSCULAR | Status: DC | PRN

## 2017-03-01 MED ORDER — FAMOTIDINE 20 MG PO TABS
20.0000 mg | ORAL_TABLET | Freq: Two times a day (BID) | ORAL | Status: DC
  Administered 2017-03-01: 20 mg via ORAL
  Filled 2017-03-01: qty 1

## 2017-03-01 MED ORDER — VANCOMYCIN HCL IN DEXTROSE 1-5 GM/200ML-% IV SOLN: 1000.0000 mg | INTRAVENOUS | Status: DC

## 2017-03-01 MED ORDER — FAMOTIDINE 20 MG PO TABS
20.0000 mg | ORAL_TABLET | Freq: Every day | ORAL | Status: DC
  Administered 2017-03-02 – 2017-03-04 (×3): 20 mg via ORAL
  Filled 2017-03-01 (×4): qty 1

## 2017-03-01 MED ORDER — CHLORHEXIDINE GLUCONATE 0.12 % MT SOLN
15.0000 mL | Freq: Two times a day (BID) | OROMUCOSAL | Status: DC
  Administered 2017-03-01 – 2017-03-04 (×4): 15 mL via OROMUCOSAL

## 2017-03-01 MED ORDER — SODIUM CHLORIDE 0.9 % IV BOLUS (SEPSIS)
500.0000 mL | Freq: Once | INTRAVENOUS | Status: AC
  Administered 2017-03-01: 500 mL via INTRAVENOUS

## 2017-03-01 MED ORDER — PHENYLEPHRINE HCL 10 MG/ML IJ SOLN
0.0000 ug/min | INTRAMUSCULAR | Status: DC
  Administered 2017-03-01: 20 ug/min via INTRAVENOUS
  Administered 2017-03-02: 30 ug/min via INTRAVENOUS
  Administered 2017-03-02: 40 ug/min via INTRAVENOUS
  Administered 2017-03-02: 20 ug/min via INTRAVENOUS
  Administered 2017-03-03: 25 ug/min via INTRAVENOUS
  Administered 2017-03-03: 20 ug/min via INTRAVENOUS
  Administered 2017-03-03: 35 ug/min via INTRAVENOUS
  Filled 2017-03-01 (×8): qty 1

## 2017-03-01 MED ORDER — DEXTROSE 50 % IV SOLN
INTRAVENOUS | Status: AC
  Administered 2017-03-01: 50 mL
  Filled 2017-03-01: qty 50

## 2017-03-01 MED ORDER — PIPERACILLIN-TAZOBACTAM 3.375 G IVPB
3.3750 g | Freq: Three times a day (TID) | INTRAVENOUS | Status: DC
  Administered 2017-03-01 – 2017-03-05 (×14): 3.375 g via INTRAVENOUS
  Filled 2017-03-01 (×16): qty 50

## 2017-03-01 MED ORDER — ACETAMINOPHEN 650 MG RE SUPP: 650.0000 mg | Freq: Four times a day (QID) | RECTAL | Status: DC | PRN

## 2017-03-01 MED ORDER — ACETAMINOPHEN 325 MG PO TABS: 650.0000 mg | ORAL_TABLET | Freq: Four times a day (QID) | ORAL | Status: DC | PRN

## 2017-03-01 MED ORDER — MORPHINE SULFATE (PF) 4 MG/ML IV SOLN
3.0000 mg | Freq: Once | INTRAVENOUS | Status: AC
  Administered 2017-03-01: 3 mg via INTRAVENOUS
  Filled 2017-03-01: qty 1

## 2017-03-01 MED ORDER — INSULIN GLARGINE 100 UNIT/ML ~~LOC~~ SOLN
7.0000 [IU] | Freq: Every day | SUBCUTANEOUS | Status: DC
  Administered 2017-03-01 – 2017-03-03 (×4): 7 [IU] via SUBCUTANEOUS
  Filled 2017-03-01 (×5): qty 0.07

## 2017-03-01 MED ORDER — ALPRAZOLAM 0.25 MG PO TABS
0.2500 mg | ORAL_TABLET | Freq: Every evening | ORAL | Status: DC | PRN
  Administered 2017-03-03 – 2017-03-04 (×2): 0.25 mg via ORAL
  Filled 2017-03-01 (×2): qty 1

## 2017-03-01 MED ORDER — MAGNESIUM SULFATE 2 GM/50ML IV SOLN
2.0000 g | Freq: Once | INTRAVENOUS | Status: AC
  Administered 2017-03-01: 2 g via INTRAVENOUS
  Filled 2017-03-01: qty 50

## 2017-03-01 MED ORDER — INSULIN ASPART 100 UNIT/ML ~~LOC~~ SOLN
0.0000 [IU] | SUBCUTANEOUS | Status: DC
  Administered 2017-03-01: 2 [IU] via SUBCUTANEOUS
  Administered 2017-03-01: 8 [IU] via SUBCUTANEOUS
  Administered 2017-03-01 (×2): 3 [IU] via SUBCUTANEOUS
  Administered 2017-03-01: 5 [IU] via SUBCUTANEOUS
  Administered 2017-03-02 (×2): 3 [IU] via SUBCUTANEOUS
  Administered 2017-03-02: 2 [IU] via SUBCUTANEOUS
  Administered 2017-03-02: 5 [IU] via SUBCUTANEOUS
  Administered 2017-03-03 (×4): 3 [IU] via SUBCUTANEOUS
  Administered 2017-03-03 – 2017-03-04 (×3): 2 [IU] via SUBCUTANEOUS
  Administered 2017-03-04: 3 [IU] via SUBCUTANEOUS

## 2017-03-01 MED ORDER — PRAVASTATIN SODIUM 40 MG PO TABS
80.0000 mg | ORAL_TABLET | Freq: Every day | ORAL | Status: DC
  Administered 2017-03-02 – 2017-03-05 (×4): 80 mg via ORAL
  Filled 2017-03-01 (×4): qty 1
  Filled 2017-03-01: qty 2

## 2017-03-01 MED ORDER — SODIUM CHLORIDE 0.9 % IV BOLUS (SEPSIS)
2000.0000 mL | Freq: Once | INTRAVENOUS | Status: AC
  Administered 2017-03-01: 2000 mL via INTRAVENOUS

## 2017-03-01 MED ORDER — VANCOMYCIN HCL 10 G IV SOLR
1500.0000 mg | Freq: Once | INTRAVENOUS | Status: AC
  Administered 2017-03-01: 1500 mg via INTRAVENOUS
  Filled 2017-03-01: qty 1500

## 2017-03-01 MED ORDER — ONDANSETRON HCL 4 MG PO TABS: 4.0000 mg | ORAL_TABLET | Freq: Four times a day (QID) | ORAL | Status: DC | PRN

## 2017-03-01 MED ORDER — ORAL CARE MOUTH RINSE
15.0000 mL | Freq: Two times a day (BID) | OROMUCOSAL | Status: DC
  Administered 2017-03-01 – 2017-03-02 (×3): 15 mL via OROMUCOSAL

## 2017-03-01 MED ORDER — FLUCONAZOLE IN SODIUM CHLORIDE 200-0.9 MG/100ML-% IV SOLN
200.0000 mg | INTRAVENOUS | Status: DC
  Administered 2017-03-01 – 2017-03-02 (×2): 200 mg via INTRAVENOUS
  Filled 2017-03-01 (×3): qty 100

## 2017-03-01 MED ORDER — SODIUM CHLORIDE 0.9 % IV SOLN
INTRAVENOUS | Status: DC
  Administered 2017-03-01 – 2017-03-03 (×3): via INTRAVENOUS

## 2017-03-01 NOTE — Care Management Note (Signed)
Case Management Note  Patient Details  Name: Robert [name] MRN: [id number] Date of Birth: 10-24-1927  Subjective/Objective:    Pt admitted  for chest pain, elevated troponin's, abdominal pain, N/V/D yesterday.  In the ER a CT AP was done and showed cholecystitis and also sepsis. His troponins were elevated originally at 0.05 and then second resulted as 2.24.               Action/Plan:  Pt was independent from home PTA.     Expected Discharge Date:                  Expected Discharge Plan:     In-House Referral:     Discharge planning Services  CM Consult  Post Acute Care Choice:    Choice offered to:     DME Arranged:    DME Agency:     HH Arranged:    HH Agency:     Status of Service:     If discussed at [employer], dates discussed:    Additional Comments:  [name], RN 03/01/2017, 3:51 PM

## 2017-03-01 NOTE — Progress Notes (Signed)
Pharmacy Antibiotic Note  Robert [name] is a 83 y.o. male admitted on 02/28/2017 with intra-abdominal infection.  Pharmacy has been consulted for Vancomycin, Zosyn, and fluconazole dosing.  Plan: Vancomycin 1500 mg*1 now Vancomycin 1000 IV every 48 hours.  Goal trough 15-20 mcg/mL. Zosyn 3.375g IV q8h (4 hour infusion).  Fluconazole 200mg  daily Follow renal function closely and adjust as indicated Vanc trough at steady state  Height: 6' (182.9 cm) Weight: 176 lb 2.4 oz (79.9 kg) IBW/kg (Calculated) : 77.6  Temp (24hrs), Avg:97.9 F (36.6 C), Min:97.4 F (36.3 C), Max:98.3 F (36.8 C)   Recent Labs Lab 02/28/17 1453 02/28/17 2344 03/01/17 0234 03/01/17 0719 03/01/17 1011  WBC 16.8*  --  17.2*  --   --   CREATININE 2.68*  --  2.78*  --   --   LATICACIDVEN  --  2.3* 5.1* 3.6* 2.3*    Estimated Creatinine Clearance: 20.9 mL/min (A) (by C-G formula based on SCr of 2.78 mg/dL (H)).    No Known Allergies  Antimicrobials this admission: Zosyn 6/7 >  Vanc 6/8 >  Fluconazole 6/8 >   Dose adjustments this admission: NA  Microbiology results: BCx 6/8:   Thank you for allowing pharmacy to be a part of this patient's care.  Melburn Popper 03/01/2017 11:29 AM

## 2017-03-01 NOTE — Progress Notes (Signed)
Pt transported to 60M overflow via stretcher. Alert and oriented x 4. Notified son of his transfer. Pt belongings sent with pt.

## 2017-03-01 NOTE — Progress Notes (Addendum)
IR aware of request for evaluation for perc chole drain.  The patient had one two years ago with a bout of cholecystitis and was deemed not an operative candidate.  Dr. Vernard Gambles has reviewed his imaging and felt it was indeterminate and would like a HIDA scan prior to proceeding with drain placement.  This has been ordered.  He will remain NPO for this scan and NPO after midnight.  He does take plavix at home.  This would need to be discussed with the on call IR doc over the weekend if the scan was positive to determine the best timing of drain placement based off of the patient's current condition.  Robert [name] E 3:57 PM 03/01/2017

## 2017-03-01 NOTE — Progress Notes (Signed)
PCCM progress note  S: Called by RN for marginal MAP.   Patient denies any pain, dizziness, or shortness of breath.  O:  BP (!) 98/53   Pulse 89   Temp 97.6 F (36.4 C) (Oral)   Resp 17   Ht 6' (1.829 m)   Wt 176 lb 2.4 oz (79.9 kg)   SpO2 99%   BMI 23.89 kg/m   General:  Pleasant elderly male lying supine in bed in NAD, conversant Neuro: alert, oriented, non focal  PULM: even/non-labored, lungs bilaterally clear, on 2L  GI: soft, non-tender, bsx4 active  Extremities: warm/dry, no edema   A:  Sepsis - unclear source, ? Acute cholecystitis s/p 3.5 L fluids Chronic systolic HF  P:  Spoke with IR r/t eval for possible perc chole drain Patient to go for HIDA scan now If MAP < 65, give another NS 500 ml bolus given stable respiratory status Consider low dose peripheral Neo if hypotension persists/ or development of pulmonary edema TTE pending   Kennieth Rad, AGACNP-BC [place] Pgr: [phone] or if no answer [phone] 03/01/2017, 4:20 PM

## 2017-03-01 NOTE — Progress Notes (Signed)
CRITICAL VALUE ALERT  Critical Value:  Lactic Acid  5.1   Troponin 0.05  Date & Time Notied:  0426  Provider Notified: [place]  Orders Received/Actions taken: Per MD give PT 2L bolus of fluid and transfer pt to step down unit. Pt vitals BP 89/45 Pulse 108

## 2017-03-01 NOTE — Progress Notes (Signed)
[facility] Progress Note Patient Name: Robert [name] DOB: [birth year] MRN: [id number]   Date of Service  03/01/2017  HPI/Events of Note  Hypotension - BP = 82/40.  eICU Interventions  Will order: 1. Phenylephrine IV infusion. Titrate to MAP >= 65.      Intervention Category Major Interventions: Hypotension - evaluation and management  Sommer,Steven Eugene 03/01/2017, 8:04 PM

## 2017-03-01 NOTE — Progress Notes (Signed)
CRITICAL VALUE ALERT  Critical Value:  Troponin 2.24  Date & Time Notied:  11:08 AM 03/01/17   Provider Notified: Asa Saunas, NP
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
## 2017-03-01 NOTE — Progress Notes (Signed)
Pharmacy Antibiotic Note  Robert [name] is a 83 y.o. male admitted on 02/28/2017 with intra-abdominal infection.  Pharmacy has been consulted for Zosyn dosing. WBC elevated. Noted renal dysfunction.   Plan: Zosyn 3.375G IV q8h to be infused over 4 hours F/U surgery plans  Height: 6' (182.9 cm) Weight: 181 lb (82.1 kg) IBW/kg (Calculated) : 77.6  Temp (24hrs), Avg:97.6 F (36.4 C), Min:97.4 F (36.3 C), Max:97.7 F (36.5 C)   Recent Labs Lab 02/28/17 1453 02/28/17 2344  WBC 16.8*  --   CREATININE 2.68*  --   LATICACIDVEN  --  2.3*    Estimated Creatinine Clearance: 21.7 mL/min (A) (by C-G formula based on SCr of 2.68 mg/dL (H)).    No Known Allergies   Ayce, Pietrzyk 03/01/2017 1:24 AM

## 2017-03-01 NOTE — Consult Note (Signed)
PULMONARY / CRITICAL CARE MEDICINE   Name: Robert [name] MRN: [id number] DOB: 12-Mar-1928    ADMISSION DATE:  02/28/2017 CONSULTATION DATE:  6/8  REFERRING MD:  [facility]  CHIEF COMPLAINT:  Abd pain N/V/D from food.  HISTORY OF PRESENT ILLNESS:   [name] is a 83 yo gentleman who lives independently and eats all 3 meals out daily([employer] gives him diarrhea) who was in his normal state of health till he ate a barbeque sandwich 6/7 which caused N/V/D along with AMS. He was on lasix 80 mg daily and continued to take lasix(CHF/ef 40%) during N/V/D. He was taken to ED for evaluation and found to have hypotension and was treated with fluids. Note : He had a similar episode 2 years ago that required a perq chole drain as he was deemed not a surgical candidate.His CT abd this admit again reveals cholecystis. He has had 2.5 litres of IVF but has proven refractory to treatment and remains hypotensive. He has been moved to ICU and PCCM will assume his care till stable. Further note he had prostate cancer 10 years ago and did received 40 rtx treatments.   PAST MEDICAL HISTORY :  He  has a past medical history of Anemia; Anxiety; Blood transfusion; CAD (coronary artery disease), native coronary artery; Cholecystitis; CKD (chronic kidney disease), stage III; Complete heart block ([place]); Diabetes mellitus without complication ([place]); GI bleeding; High cholesterol; Ischemic cardiomyopathy; Moderate mitral regurgitation; Prostate cancer (HCC); PVD (peripheral vascular disease) (HCC) (39% bilateral carotids); and Type II or unspecified type diabetes mellitus with renal manifestations, not stated as uncontrolled(250.40).  PAST SURGICAL HISTORY: He  has a past surgical history that includes liver laceration (1956); Coronary angioplasty with stent (08/2003); Coronary angioplasty with stent (12/2010); Inguinal hernia repair (02/2000); Lumbar disc surgery (~ 1990's); Back surgery; Esophagogastroduodenoscopy (10/19/2011); and  Endoscopic retrograde cholangiopancreatography (ercp) with propofol (N/A, 09/09/2014).  No Known Allergies  No current facility-administered medications on file prior to encounter.    Current Outpatient Prescriptions on File Prior to Encounter  Medication Sig  . BD PEN NEEDLE NANO U/F 32G X 4 MM MISC Use to inject insulin 4  times daily as directed  . Cholecalciferol (VITAMIN D3) 5000 units CAPS Take 1 capsule by mouth daily.  . furosemide (LASIX) 40 MG tablet TAKE ONE-HALF TABLET BY  MOUTH TWO TIMES DAILY (Patient taking differently: 80 mg TABLET  DAILY)  . HUMALOG KWIKPEN 100 UNIT/ML KiwkPen INJECT 0.08 MLS (8 UNITS)  INTO THE SKIN 3 TIMES  DAILY.  Marland Kitchen Insulin Glargine (LANTUS SOLOSTAR) 100 UNIT/ML Solostar Pen Inject subcutaneously 14  units daily (Patient taking differently: Inject subcutaneously 15  units daily)  . Multiple Vitamin (MULTI VITAMIN MENS) tablet Take 1 tablet by mouth daily.  . multivitamin-lutein (OCUVITE-LUTEIN) CAPS capsule Take 1 capsule by mouth daily.  . ONE TOUCH ULTRA TEST test strip TEST 3 TIMES A DAY DIAG E11.29  . pravastatin (PRAVACHOL) 80 MG tablet Take 1 tablet by mouth  daily  . ranitidine (ZANTAC) 150 MG tablet Take 150 mg by mouth 2 (two) times daily.  Marland Kitchen ALPRAZolam (XANAX) 0.25 MG tablet     FAMILY HISTORY:  His indicated that his mother is deceased. He indicated that his father is deceased. He indicated that the status of his brother is unknown. He indicated that his maternal grandmother is deceased. He indicated that his maternal grandfather is deceased. He indicated that his paternal grandmother is deceased. He indicated that his paternal grandfather is deceased. He indicated that the status  of his neg hx is unknown.    SOCIAL HISTORY: He  reports that he has quit smoking. His smoking use included Cigars. He has never used smokeless tobacco. He reports that he does not drink alcohol or use drugs.  REVIEW OF SYSTEMS:   10 point review of system taken,  please see HPI for positives and negatives.   SUBJECTIVE:  Hypotensive but mentation is clear.   VITAL SIGNS: BP (!) 93/59 (BP Location: Left Arm)   Pulse (!) 106   Temp 98.3 F (36.8 C)   Resp (!) 27   Ht 6' (1.829 m)   Wt 176 lb 2.4 oz (79.9 kg)   SpO2 99%   BMI 23.89 kg/m   HEMODYNAMICS:    VENTILATOR SETTINGS:    INTAKE / OUTPUT: I/O last 3 completed shifts: In: 2175 [I.V.:75; Other:2000; IV Piggyback:100] Out: 1 [Stool:1]  PHYSICAL EXAMINATION: General:  WNWD 83 yo who is lucid and does complain of pain other than abd discomfort Neuro:  Intact HEENT:  No JVD/LAN Cardiovascular:  HSR RRR Lungs: CTA Abdomen:  +bs, ROQ tenderness  Musculoskeletal:  Intact  Skin:  Warm and dry  LABS:  BMET  Recent Labs Lab 02/28/17 1453 03/01/17 0234  NA 138 139  K 4.0 4.3  CL 101 99*  CO2 23 24  BUN 37* 40*  CREATININE 2.68* 2.78*  GLUCOSE 272* 300*    Electrolytes  Recent Labs Lab 02/28/17 1453 03/01/17 0234  CALCIUM 10.2 9.3    CBC  Recent Labs Lab 02/28/17 1453 03/01/17 0234  WBC 16.8* 17.2*  HGB 11.4* 10.5*  HCT 34.1* 32.6*  PLT 383 286    Coag's No results for input(s): APTT, INR in the last 168 hours.  Sepsis Markers  Recent Labs Lab 02/28/17 2344 03/01/17 0234 03/01/17 0719  LATICACIDVEN 2.3* 5.1* 3.6*    ABG No results for input(s): PHART, PCO2ART, PO2ART in the last 168 hours.  Liver Enzymes  Recent Labs Lab 02/28/17 2344 03/01/17 0234  AST 21 32  ALT 13* 13*  ALKPHOS 50 53  BILITOT 2.4* 2.9*  ALBUMIN 4.3 4.0    Cardiac Enzymes  Recent Labs Lab 03/01/17 0234  TROPONINI 0.05*    Glucose  Recent Labs Lab 02/28/17 1501 03/01/17 0115 03/01/17 0412 03/01/17 0636 03/01/17 0718  GLUCAP 249* 285* 232* 54* 107*    Imaging Ct Abdomen Pelvis Wo Contrast  Result Date: 02/28/2017 CLINICAL DATA:  Right upper quadrant and epigastric abdominal pain. Nausea, vomiting and diarrhea. History of cholecystitis treated  with percutaneous cholecystostomy. EXAM: CT ABDOMEN AND PELVIS WITHOUT CONTRAST TECHNIQUE: Multidetector CT imaging of the abdomen and pelvis was performed following the standard protocol without IV contrast. COMPARISON:  CT 05/09/2015 FINDINGS: Lower chest: No consolidation or pleural fluid. Coronary artery calcifications are seen. Hepatobiliary: Multiple intraluminal gallstones. Suggestion of mild gallbladder wall thickening and pericholecystic soft tissue stranding. No biliary dilatation. No focal hepatic lesion allowing for lack contrast. Small peripheral metallic density about the right lobe of the liver, unchanged from prior exam. Scattered hepatic granulomas. Pancreas: No ductal dilatation or inflammation. Parenchymal atrophy. Spleen: Normal in size without focal abnormality. Adrenals/Urinary Tract: Mild left adrenal thickening without discrete nodule. Thinning of bilateral renal parenchyma. No hydronephrosis or perinephric edema. Ureters are decompressed. Urinary bladder is physiologically distended, no wall thickening. Stomach/Bowel: Mild sigmoid colonic diverticulosis without diverticulitis. Normal appendix. No small bowel dilatation or inflammation. There is a 12 mm soft tissue nodular density in the second portion of the duodenum. Vascular/Lymphatic: Aortic  and branch atherosclerosis. No aneurysm. No adenopathy. Reproductive: Enlarged prostate gland spanning 6.3 cm. Brachytherapy seeds. Other: No ascites or free air. Tacks in the lower anterior abdominal wall post hernia repair. No evidence of recurrent hernia. Musculoskeletal: There are no acute or suspicious osseous abnormalities. Degenerative change at L4-L5 and L5-S1. IMPRESSION: 1. Cholelithiasis with probable gallbladder wall thickening and mild pericholecystic inflammation, suspicious for acute cholecystitis. No biliary dilatation. 2. Nodular 12 mm soft tissue density in the second portion of the duodenum, while this may reflect focal fold  thickening, endoscopic evaluation may be helpful to exclude focal mass. 3. Aortic atherosclerosis.  Coronary artery calcifications. Electronically Signed   By: Jeb Levering M.D.   On: 02/28/2017 21:41   Dg Chest 2 View  Result Date: 02/28/2017 CLINICAL DATA:  Chest pain for 2 days EXAM: CHEST  2 VIEW COMPARISON:  05/11/2015 FINDINGS: Cardiac shadows within normal limits. The lungs are well aerated bilaterally. Patchy chronic changes are noted in the right upper lobe. Chronic blunting of the right costophrenic angle is noted. No focal infiltrate or sizable effusion is seen. No acute bony abnormality is noted. IMPRESSION: No acute abnormality seen. Electronically Signed   By: Inez Catalina M.D.   On: 02/28/2017 16:03     STUDIES:  6/7 ct abd as noted 6/8 abd US>>  CULTURES: 6/8 bc>>  ANTIBIOTICS:  6/8 pip-tazo>> 6/8 diflucan>>  SIGNIFICANT EVENTS: 6/7 admit to [facility]  LINES/TUBES: PIV  DISCUSSION: [name] is a 83 yo gentleman who lives independently and eats all 3 meals out daily([employer] gives him diarrhea) who was in his normal state of health till he ate a barbeque sandwich 6/7 which caused N/V/D along with AMS. He was on lasix 80 mg daily and continued to take lasix(CHF/ef 40%) during N/V/D. He was taken to ED for evaluation and found to have hypotension and was treated with fluids. Note : He had a similar episode 2 years ago that required a perq chole drain as he was deemed not a surgical candidate.His CT abd this admit again reveals cholecystis. He has had 2.5 litres of IVF but has proven refractory to treatment and remains hypotensive. He has been moved to ICU and PCCM will assume his care till stable. Further note he had prostate cancer 10 years ago and did received 40 rtx treatments.   ASSESSMENT / PLAN:  PULMONARY A: No acute issue P:   Monitor for pulmonary  Edema in CHF patient being fluid resuscitated  O2 as needed  CARDIOVASCULAR A:  Shock from sepsis Chronic  lasix use for CHF with continued use coupled with N/V/D from food poisoning  CHF ef 40% CAD PVD on oplavix +trop  P:  Sepsis  Protocol with careful fluid management due to CHF from ICM Broad spectrum abx + antifungal Trend lactic acid and pro calcitonin  Check cortisol level for completeness  Change to ICU status till stable Will consult cards  RENAL Lab Results  Component Value Date   CREATININE 2.78 (H) 03/01/2017   CREATININE 2.68 (H) 02/28/2017   CREATININE 2.35 (H) 12/13/2016   CREATININE 2.48 (H) 07/27/2014      A:   CKD base creatine 2.2 P:   Avoid nephrotoxins Maintain adequate BP to insure renal blood supply  GASTROINTESTINAL A:   Recurrent cholecystitis  Toxic food ingestion GI protection P:   NPO PPI    Recent Labs  02/28/17 1453 03/01/17 0234  HGB 11.4* 10.5*   HEMATOLOGIC A:   On plavix for PVD  DVT protection  P:  Serial cbc Stop plavix in prep for ? perq drain PAS  INFECTIOUS A:   Sepsis from presumed abd source CT abd with cholelithiasis , 12 mm duodenal ?mass. Cholecystitis hx that required perq drain with deferred surgery([facility]S following this admit  P:   CCS consult noted CT abd noted [place] abd pending>> ABX as noted ? Repeat perq drain + or - HIDA scan  ENDOCRINE CBG (last 3)   Recent Labs  03/01/17 0412 03/01/17 0636 03/01/17 0718  GLUCAP 232* 54* 107*     A:   IDDM  P:   SSI whole I ICU  NEUROLOGIC A:   Awake and alert No Hx of neurological compromise  P:   RASS goal: 0 Current Rass 1   FAMILY  - Updates: Pt updated at bedside 0900 03/01/17  - Inter-disciplinary family meet or Palliative Care meeting due by:  day 7  -My CCT 45 mins   Richardson Landry Minor ACNP Maryanna Shape PCCM Pager [phone] till 3 pm If no answer page [phone] 03/01/2017, 9:13 AM   STAFF NOTE: I, Merrie Roof, MD FACP have personally reviewed patient's available data, including medical history, events of note, physical examination and  test results as part of my evaluation. I have discussed with resident/NP and other care providers such as pharmacist, RN and RRT. In addition, I personally evaluated patient and elicited key findings of: awake, alert, no distress, lungs clear, JVD down, dry skin, mild ruq voluntary guarding, no rebound, no edema, he does NOT apear toxic, has no chest , has had hypotension, likely combo sepsis ( gallbadder concerns) and hypovolemia ( in setting known cardiac dz), lactic was upward now downward, would provide more volume bolus and drip fluids, add vanc, diflucan given concerns declines hemodynamics, use PIV, may need NON invasive monitor to determine future volume needs, surgery is sollowing, [place] to correlate with CT which I have reviewed and showed thickening and peri chol fluids with h/o of prior drain, get trop, ecg to ensure no ischemia driving, may need neo added if MAP less 60, keep in icu for now, get cortisol, I have update pt in full in room The patient is critically ill with multiple organ systems failure and requires high complexity decision making for assessment and support, frequent evaluation and titration of therapies, application of advanced monitoring technologies and extensive interpretation of multiple databases.   Critical Care Time devoted to patient care services described in this note is 30 Minutes. This time reflects time of care of this signee: Merrie Roof, MD FACP. This critical care time does not reflect procedure time, or teaching time or supervisory time of PA/NP/Med student/Med Resident etc but could involve care discussion time. Rest per NP/medical resident whose note is outlined above and that I agree with   Lavon Paganini. Titus Mould, MD, [place] Pgr: [facility] 03/01/2017 11:47 AM
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
## 2017-03-01 NOTE — Progress Notes (Signed)
[facility] Progress Note    Subjective: Patient is awake and alert.  No distress.  Objective: Vital signs in last 24 hours: Temp:  [97.4 F (36.3 C)-98.3 F (36.8 C)] 98.3 F (36.8 C) (06/08 0637) Pulse Rate:  [88-111] 106 (06/08 0800) Resp:  [18-28] 27 (06/08 0800) BP: (75-146)/(45-96) 93/59 (06/08 0800) SpO2:  [92 %-100 %] 99 % (06/08 0800) Weight:  [77.5 kg (170 lb 13.7 oz)-82.1 kg (181 lb)] 79.9 kg (176 lb 2.4 oz) (06/08 0637) Last BM Date: 02/28/17  Intake/Output from previous day: 06/07 0701 - 06/08 0700 In: 2175 [I.V.:75; IV Piggyback:100] Out: 1 [Stool:1] Intake/Output this shift: Total I/O In: 75 [I.V.:75] Out: -   General: No acute distress.  BP still marginally low.  Lactic acid level still > 3  Lungs: Clear  Abd: soft, no palpable mass in the RUQ.  Mildly tender in the RUQ.  No peritonitis.  Active bowel sounds..  Extremities: No changes  Neuro: Intact  Lab Results:  @LABLAST2 (wbc:2,hgb:2,hct:2,plt:2) BMET ) Recent Labs  02/28/17 1453 03/01/17 0234  NA 138 139  K 4.0 4.3  CL 101 99*  CO2 23 24  GLUCOSE 272* 300*  BUN 37* 40*  CREATININE 2.68* 2.78*  CALCIUM 10.2 9.3   PT/INR No results for input(s): LABPROT, INR in the last 72 hours. ABG No results for input(s): PHART, HCO3 in the last 72 hours.  Invalid input(s): PCO2, PO2  Studies/Results: Ct Abdomen Pelvis Wo Contrast  Result Date: 02/28/2017 CLINICAL DATA:  Right upper quadrant and epigastric abdominal pain. Nausea, vomiting and diarrhea. History of cholecystitis treated with percutaneous cholecystostomy. EXAM: CT ABDOMEN AND PELVIS WITHOUT CONTRAST TECHNIQUE: Multidetector CT imaging of the abdomen and pelvis was performed following the standard protocol without IV contrast. COMPARISON:  CT 05/09/2015 FINDINGS: Lower chest: No consolidation or pleural fluid. Coronary artery calcifications are seen. Hepatobiliary: Multiple intraluminal gallstones. Suggestion of mild gallbladder wall  thickening and pericholecystic soft tissue stranding. No biliary dilatation. No focal hepatic lesion allowing for lack contrast. Small peripheral metallic density about the right lobe of the liver, unchanged from prior exam. Scattered hepatic granulomas. Pancreas: No ductal dilatation or inflammation. Parenchymal atrophy. Spleen: Normal in size without focal abnormality. Adrenals/Urinary Tract: Mild left adrenal thickening without discrete nodule. Thinning of bilateral renal parenchyma. No hydronephrosis or perinephric edema. Ureters are decompressed. Urinary bladder is physiologically distended, no wall thickening. Stomach/Bowel: Mild sigmoid colonic diverticulosis without diverticulitis. Normal appendix. No small bowel dilatation or inflammation. There is a 12 mm soft tissue nodular density in the second portion of the duodenum. Vascular/Lymphatic: Aortic and branch atherosclerosis. No aneurysm. No adenopathy. Reproductive: Enlarged prostate gland spanning 6.3 cm. Brachytherapy seeds. Other: No ascites or free air. Tacks in the lower anterior abdominal wall post hernia repair. No evidence of recurrent hernia. Musculoskeletal: There are no acute or suspicious osseous abnormalities. Degenerative change at L4-L5 and L5-S1. IMPRESSION: 1. Cholelithiasis with probable gallbladder wall thickening and mild pericholecystic inflammation, suspicious for acute cholecystitis. No biliary dilatation. 2. Nodular 12 mm soft tissue density in the second portion of the duodenum, while this may reflect focal fold thickening, endoscopic evaluation may be helpful to exclude focal mass. 3. Aortic atherosclerosis.  Coronary artery calcifications. Electronically Signed   By: Jeb Levering M.D.   On: 02/28/2017 21:41   Dg Chest 2 View  Result Date: 02/28/2017 CLINICAL DATA:  Chest pain for 2 days EXAM: CHEST  2 VIEW COMPARISON:  05/11/2015 FINDINGS: Cardiac shadows within normal limits. The lungs are well aerated bilaterally.  Patchy  chronic changes are noted in the right upper lobe. Chronic blunting of the right costophrenic angle is noted. No focal infiltrate or sizable effusion is seen. No acute bony abnormality is noted. IMPRESSION: No acute abnormality seen. Electronically Signed   By: Inez Catalina M.D.   On: 02/28/2017 16:03    Anti-infectives: Anti-infectives    Start     Dose/Rate Route Frequency Ordered Stop   03/01/17 0600  piperacillin-tazobactam (ZOSYN) IVPB 3.375 g     3.375 g 12.5 mL/hr over 240 Minutes Intravenous Every 8 hours 03/01/17 0124     02/28/17 2300  piperacillin-tazobactam (ZOSYN) IVPB 3.375 g     3.375 g 100 mL/hr over 30 Minutes Intravenous  Once 02/28/17 2246 02/28/17 2332      Assessment/Plan: s/p  If the gallbladder is the source of sepsis--which I believe is likely--then thepatient needs a percutaneous GB drain replaced.    He last had a GB drain about 2 years ago  Radiology may want to get a HIDA scan before placing the drain, but I believe that it can be done without the HIDA.  LOS: 1 day   Kathryne Eriksson. Dahlia Bailiff, MD, FACS [phone] [facility] 03/01/2017

## 2017-03-01 NOTE — Progress Notes (Signed)
CRITICAL VALUE ALERT  Critical Value: Lactic Acid  2.3   Date & Time Notied:  03/01/2017 0040   Provider Notified: [id number]  Orders Received/Actions taken: 500 ml Bolus

## 2017-03-01 NOTE — Progress Notes (Signed)
Orders placed to transport pt to 3M09 due to critical lactic acid value of 5.1. Upon charge RN review of chart for transfer approval, noted that this critical value usually requires ICU level of care. Called rapid response RN to assess pt and determine appropriateness of transfer.  AC aware, floor RN aware. Will continue to monitor and approve if appropriate.

## 2017-03-01 NOTE — Significant Event (Signed)
Rapid Response Event Note  Overview: Time Called: 9811 Arrival Time: 0500 Event Type: Hypotension  Initial Focused Assessment:  Received call from Gerald Stabs, RN on [facility] reference evaluation of pending SDU transfer of Mr Cotto.  Concern for need of ICU level of care due to rising lactic acid of 5.1 and hypotension in setting of acute cholecystitis.    On exam, pt appears in no acute distress.  He follows commands, and answers questions appropriately. Lungs sounds are clear to upper lobes with faint crackles to L lung base.  No tachypnea or increased WOB.   O2 saturation 99% on 2L.  He is slightly tachycardic with a rate of 106.  He is hypotensive with blood pressures consistently 91-47 systolic.  Last documented BP was 89/47 at 0520.  Cap refill is <3 sec.  His abdomen appears slightly distended, but is without pain to palpation, he denies any acute abdominal pain and BS are normal.    Dr. Loleta Books was called to bedside by pt's primary RN Lars Mage.  Plan remains to keep pt on hospitalist service and transfer to SDU level of care.  Dr. Loleta Books ordered 2 L NS bolus and to trend lactic acid.  CCM was called by Dr. Loleta Books.    Pt received bed on 2MW as a SDU overflow.  2MW CN informed of pending transfer.     Event Summary: Name of Physician Notified: Dr. Loleta Books at [phone]    at          [facility]

## 2017-03-01 NOTE — Plan of Care (Signed)
I had seen and examined the patient this morning and discussed with Dr. Titus Mould. Per Dr. Janit Pagan note, PCCM will assume care until patient is moved out of ICU. Appreciate PCCM assistance, I will sign off for now.    Estill Cotta M.D. [facility] 03/01/2017, 1:14 PM  Pager: [phone]

## 2017-03-01 NOTE — Progress Notes (Signed)
Called to bedside for rapid response.  Patient AM BP has dropped to 80s/50s, lactic acid 5.1, trending up.  Troponin minimal elevation.    BP (!) 89/47   Pulse (!) 102   Temp 97.7 F (36.5 C)   Resp 20   Ht 6' (1.829 m)   Wt 77.5 kg (170 lb 13.7 oz)   SpO2 99%   BMI 23.17 kg/m  Patient mentating well, awake alert. Asks why he has to be transferred, his BP dropped like this last time and nothing happened. Belly exam still benign, actually no pain to palpation, no rigidity, no rebound.  Cap refill normal.  HR tachycardic, more than before.     Worsening sepsis from cholecystitis Type 2 troponinemia, not thrombotic  Transfer to SDU 2L NS ordered, to make 30 cc/kg IV antibiotics continued Trend lactic acid and troponins Cardiology has already been consulted I did discuss by phone with E-link MD, CCM aware, in case patient does not improve as expected with continued IV antibiotics and additional fluids CODE STATUS reviewed, patient FULL CODE

## 2017-03-02 ENCOUNTER — Inpatient Hospital Stay (HOSPITAL_COMMUNITY): Payer: Medicare Other

## 2017-03-02 ENCOUNTER — Other Ambulatory Visit: Payer: Self-pay

## 2017-03-02 ENCOUNTER — Encounter (HOSPITAL_COMMUNITY): Payer: Self-pay | Admitting: Interventional Radiology

## 2017-03-02 DIAGNOSIS — I361 Nonrheumatic tricuspid (valve) insufficiency: Secondary | ICD-10-CM

## 2017-03-02 DIAGNOSIS — I214 Non-ST elevation (NSTEMI) myocardial infarction: Secondary | ICD-10-CM

## 2017-03-02 HISTORY — PX: IR PERC CHOLECYSTOSTOMY: IMG2326

## 2017-03-02 LAB — CBC
HCT: 26.8 % — ABNORMAL LOW (ref 39.0–52.0)
HEMOGLOBIN: 8.5 g/dL — AB (ref 13.0–17.0)
MCH: 34.6 pg — AB (ref 26.0–34.0)
MCHC: 31.7 g/dL (ref 30.0–36.0)
MCV: 108.9 fL — AB (ref 78.0–100.0)
Platelets: 225 10*3/uL (ref 150–400)
RBC: 2.46 MIL/uL — AB (ref 4.22–5.81)
RDW: 17.7 % — ABNORMAL HIGH (ref 11.5–15.5)
WBC: 25.8 10*3/uL — ABNORMAL HIGH (ref 4.0–10.5)

## 2017-03-02 LAB — COMPREHENSIVE METABOLIC PANEL
ALT: 55 U/L (ref 17–63)
ANION GAP: 10 (ref 5–15)
AST: 63 U/L — ABNORMAL HIGH (ref 15–41)
Albumin: 2.8 g/dL — ABNORMAL LOW (ref 3.5–5.0)
Alkaline Phosphatase: 49 U/L (ref 38–126)
BUN: 35 mg/dL — ABNORMAL HIGH (ref 6–20)
CHLORIDE: 111 mmol/L (ref 101–111)
CO2: 22 mmol/L (ref 22–32)
Calcium: 7.9 mg/dL — ABNORMAL LOW (ref 8.9–10.3)
Creatinine, Ser: 2.58 mg/dL — ABNORMAL HIGH (ref 0.61–1.24)
GFR calc non Af Amer: 21 mL/min — ABNORMAL LOW (ref >60)
GFR, EST AFRICAN AMERICAN: 24 mL/min — AB (ref >60)
Glucose, Bld: 144 mg/dL — ABNORMAL HIGH (ref 65–99)
Potassium: 3.9 mmol/L (ref 3.5–5.1)
Sodium: 143 mmol/L (ref 135–145)
Total Bilirubin: 1.5 mg/dL — ABNORMAL HIGH (ref 0.3–1.2)
Total Protein: 5.2 g/dL — ABNORMAL LOW (ref 6.5–8.1)

## 2017-03-02 LAB — PROTIME-INR
INR: 1.32
Prothrombin Time: 16.5 seconds — ABNORMAL HIGH (ref 11.4–15.2)

## 2017-03-02 LAB — GLUCOSE, CAPILLARY
GLUCOSE-CAPILLARY: 180 mg/dL — AB (ref 65–99)
GLUCOSE-CAPILLARY: 173 mg/dL — AB (ref 65–99)
Glucose-Capillary: 112 mg/dL — ABNORMAL HIGH (ref 65–99)
Glucose-Capillary: 146 mg/dL — ABNORMAL HIGH (ref 65–99)
Glucose-Capillary: 221 mg/dL — ABNORMAL HIGH (ref 65–99)
Glucose-Capillary: 191 mg/dL — ABNORMAL HIGH (ref 65–99)

## 2017-03-02 LAB — TROPONIN I: TROPONIN I: 1.69 ng/mL — AB (ref <0.03)

## 2017-03-02 LAB — ECHOCARDIOGRAM COMPLETE
Height: 72 in
WEIGHTICAEL: 3005.31 [oz_av]

## 2017-03-02 LAB — LACTIC ACID, PLASMA: LACTIC ACID, VENOUS: 1.4 mmol/L (ref 0.5–1.9)

## 2017-03-02 MED ORDER — FENTANYL CITRATE (PF) 100 MCG/2ML IJ SOLN
INTRAMUSCULAR | Status: AC | PRN
  Administered 2017-03-02: 25 ug via INTRAVENOUS

## 2017-03-02 MED ORDER — FENTANYL CITRATE (PF) 100 MCG/2ML IJ SOLN
INTRAMUSCULAR | Status: AC
  Filled 2017-03-02: qty 4

## 2017-03-02 MED ORDER — HYDROMORPHONE HCL 1 MG/ML IJ SOLN
INTRAMUSCULAR | Status: AC
  Filled 2017-03-02: qty 1

## 2017-03-02 MED ORDER — MIDAZOLAM HCL 2 MG/2ML IJ SOLN
INTRAMUSCULAR | Status: AC
  Filled 2017-03-02: qty 4

## 2017-03-02 MED ORDER — MIDAZOLAM HCL 2 MG/2ML IJ SOLN
INTRAMUSCULAR | Status: AC | PRN
  Administered 2017-03-02: 1 mg via INTRAVENOUS

## 2017-03-02 MED ORDER — LIDOCAINE HCL 1 % IJ SOLN
INTRAMUSCULAR | Status: AC
  Filled 2017-03-02: qty 20

## 2017-03-02 MED ORDER — IOPAMIDOL (ISOVUE-300) INJECTION 61%
INTRAVENOUS | Status: AC
  Administered 2017-03-02: 25 mL
  Filled 2017-03-02: qty 50

## 2017-03-02 MED ORDER — LIDOCAINE HCL 1 % IJ SOLN
INTRAMUSCULAR | Status: AC | PRN
  Administered 2017-03-02: 10 mL

## 2017-03-02 NOTE — Sedation Documentation (Signed)
Pt tolerating procedure well at this time.

## 2017-03-02 NOTE — Progress Notes (Signed)
Subjective/Chief Complaint: Feels cold Denies abdominal pain at present   Objective: Vital signs in last 24 hours: Temp:  [97.6 F (36.4 C)-102.5 F (39.2 C)] 98.2 F (36.8 C) (06/09 1230) Pulse Rate:  [39-110] 104 (06/09 1245) Resp:  [16-43] 23 (06/09 1245) BP: (74-144)/(33-87) 97/40 (06/09 1245) SpO2:  [88 %-100 %] 94 % (06/09 1245) Weight:  [85.2 kg (187 lb 13.3 oz)] 85.2 kg (187 lb 13.3 oz) (06/09 0500) Last BM Date: 02/28/17  Intake/Output from previous day: 06/08 0701 - 06/09 0700 In: 4921.3 [I.V.:2463; IV Piggyback:2458.3] Out: 1150 [Urine:1150] Intake/Output this shift: Total I/O In: 666.3 [I.V.:566.3; IV Piggyback:100] Out: 250 [Urine:250]  General appearance: alert and cooperative Resp: unlabored GI: soft, nondistended, nontender  Lab Results:   Recent Labs  03/01/17 0234 03/02/17 0543  WBC 17.2* 25.8*  HGB 10.5* 8.5*  HCT 32.6* 26.8*  PLT 286 225   BMET  Recent Labs  03/01/17 0234 03/02/17 0543  NA 139 143  K 4.3 3.9  CL 99* 111  CO2 24 22  GLUCOSE 300* 144*  BUN 40* 35*  CREATININE 2.78* 2.58*  CALCIUM 9.3 7.9*   PT/INR  Recent Labs  03/02/17 0543  LABPROT 16.5*  INR 1.32   ABG No results for input(s): PHART, HCO3 in the last 72 hours.  Invalid input(s): PCO2, PO2  Studies/Results: Ct Abdomen Pelvis Wo Contrast  Result Date: 02/28/2017 CLINICAL DATA:  Right upper quadrant and epigastric abdominal pain. Nausea, vomiting and diarrhea. History of cholecystitis treated with percutaneous cholecystostomy. EXAM: CT ABDOMEN AND PELVIS WITHOUT CONTRAST TECHNIQUE: Multidetector CT imaging of the abdomen and pelvis was performed following the standard protocol without IV contrast. COMPARISON:  CT 05/09/2015 FINDINGS: Lower chest: No consolidation or pleural fluid. Coronary artery calcifications are seen. Hepatobiliary: Multiple intraluminal gallstones. Suggestion of mild gallbladder wall thickening and pericholecystic soft tissue  stranding. No biliary dilatation. No focal hepatic lesion allowing for lack contrast. Small peripheral metallic density about the right lobe of the liver, unchanged from prior exam. Scattered hepatic granulomas. Pancreas: No ductal dilatation or inflammation. Parenchymal atrophy. Spleen: Normal in size without focal abnormality. Adrenals/Urinary Tract: Mild left adrenal thickening without discrete nodule. Thinning of bilateral renal parenchyma. No hydronephrosis or perinephric edema. Ureters are decompressed. Urinary bladder is physiologically distended, no wall thickening. Stomach/Bowel: Mild sigmoid colonic diverticulosis without diverticulitis. Normal appendix. No small bowel dilatation or inflammation. There is a 12 mm soft tissue nodular density in the second portion of the duodenum. Vascular/Lymphatic: Aortic and branch atherosclerosis. No aneurysm. No adenopathy. Reproductive: Enlarged prostate gland spanning 6.3 cm. Brachytherapy seeds. Other: No ascites or free air. Tacks in the lower anterior abdominal wall post hernia repair. No evidence of recurrent hernia. Musculoskeletal: There are no acute or suspicious osseous abnormalities. Degenerative change at L4-L5 and L5-S1. IMPRESSION: 1. Cholelithiasis with probable gallbladder wall thickening and mild pericholecystic inflammation, suspicious for acute cholecystitis. No biliary dilatation. 2. Nodular 12 mm soft tissue density in the second portion of the duodenum, while this may reflect focal fold thickening, endoscopic evaluation may be helpful to exclude focal mass. 3. Aortic atherosclerosis.  Coronary artery calcifications. Electronically Signed   By: Jeb Levering M.D.   On: 02/28/2017 21:41   Dg Chest 2 View  Result Date: 02/28/2017 CLINICAL DATA:  Chest pain for 2 days EXAM: CHEST  2 VIEW COMPARISON:  05/11/2015 FINDINGS: Cardiac shadows within normal limits. The lungs are well aerated bilaterally. Patchy chronic changes are noted in the right  upper lobe. Chronic blunting of  the right costophrenic angle is noted. No focal infiltrate or sizable effusion is seen. No acute bony abnormality is noted. IMPRESSION: No acute abnormality seen. Electronically Signed   By: Inez Catalina M.D.   On: 02/28/2017 16:03   Nm Hepatobiliary Liver Func  Result Date: 03/01/2017 CLINICAL DATA:  83 year old male with abdominal pain and nausea vomiting. EXAM: NUCLEAR MEDICINE HEPATOBILIARY IMAGING TECHNIQUE: Sequential images of the abdomen were obtained out to 60 minutes following intravenous administration of radiopharmaceutical. 3 mg of morphine IV was administered after the initial first hour. RADIOPHARMACEUTICALS:  5.43 mCi Tc-34m  Choletec IV COMPARISON:  None. FINDINGS: Prompt homogeneous hepatic uptake is identified. Small bowel activity is identified at 15 minutes. Gallbladder activity is not identified even after the administration of morphine. IMPRESSION: Nonvisualization of the gallbladder compatible with cystic duct obstruction/ acute cholecystitis. Electronically Signed   By: Margarette Canada M.D.   On: 03/01/2017 19:51   US Abdomen Complete  Result Date: 03/01/2017 CLINICAL DATA:  Abdominal pain and hypotension EXAM: ABDOMEN ULTRASOUND COMPLETE COMPARISON:  CT abdomen and pelvis February 28, 2017 FINDINGS: Gallbladder: Within the gallbladder, there are multiple echogenic foci which move and shadow consistent with cholelithiasis. Largest gallstone measures 2.0 cm in length. The gallbladder wall appears slightly thickened. There is no demonstrable pericholecystic fluid. Note that there is sludge in the gallbladder as well. No sonographic Murphy sign noted by sonographer. Common bile duct: Diameter: 4 mm. No intrahepatic, common hepatic, or common bile duct dilatation. Liver: No focal lesion identified. Within normal limits in parenchymal echogenicity. IVC: No abnormality visualized. Pancreas: Visualized portion unremarkable. Much of the pancreas is obscured by gas.  Spleen: Size and appearance within normal limits. Right Kidney: Length: 9.5 cm. Echogenicity within normal limits. There is renal cortical thinning. No mass or hydronephrosis visualized. Left Kidney: Length: 8.8 cm. Echogenicity within normal limits. There is renal cortical thinning. No mass or hydronephrosis visualized. Abdominal aorta: No aneurysm visualized. Other findings: No demonstrable ascites. IMPRESSION: Gallstones with gallbladder wall is slightly thickened. No pericholecystic fluid demonstrated. The appearance currently may be indicative of early cholecystitis. Portions of pancreas obscured by gas. Visualized portions of pancreas appear unremarkable. Small kidneys with renal cortical thinning. These findings may be indicative of a degree of underlying medical renal disease. No renal obstructing focus on either side. Electronically Signed   By: Lowella Grip III M.D.   On: 03/01/2017 11:34   Ir Perc Cholecystostomy  Result Date: 03/02/2017 INDICATION: Acute cholecystitis.  Sepsis. EXAM: CHOLECYSTOSTOMY MEDICATIONS: None ANESTHESIA/SEDATION: Fentanyl 25 mcg IV; Versed 1 mg IV Moderate Sedation Time:  10 The patient was continuously monitored during the procedure by the interventional radiology nurse under my direct supervision. FLUOROSCOPY TIME:  Fluoroscopy Time:  minutes 36 seconds (3 mGy). COMPLICATIONS: None immediate. PROCEDURE: Informed written consent was obtained from the patient after a thorough discussion of the procedural risks, benefits and alternatives. All questions were addressed. Maximal Sterile Barrier Technique was utilized including caps, mask, sterile gowns, sterile gloves, sterile drape, hand hygiene and skin antiseptic. A timeout was performed prior to the initiation of the procedure. The right upper quadrant was prepped with Betadine in a sterile fashion, and a sterile drape was applied covering the operative field. A sterile gown and sterile gloves were used for the procedure.  A 21 gauge needle was inserted into the gallbladder lumen under sonographic guidance and via transhepatic approach. It was removed over a 018 wire, which was upsized to a 3-J. A 10-French drain was advanced over the  wire and coiled in the gallbladder lumen. It was sewn to the skin after being string fixed. Contrast was injected. FINDINGS: The cystic duct is occluded. Final imaging demonstrates placement of a 10 French drain coiled in the lumen of the gallbladder. IMPRESSION: Successful cholecystostomy. This needs to remain in place at least 6 weeks. Electronically Signed   By: Marybelle Killings M.D.   On: 03/02/2017 12:01    Anti-infectives: Anti-infectives    Start     Dose/Rate Route Frequency Ordered Stop   03/03/17 1200  vancomycin (VANCOCIN) IVPB 1000 mg/200 mL premix  Status:  Discontinued     1,000 mg 200 mL/hr over 60 Minutes Intravenous Every 48 hours 03/01/17 1127 03/02/17 1008   03/01/17 1130  fluconazole (DIFLUCAN) IVPB 200 mg     200 mg 100 mL/hr over 60 Minutes Intravenous Every 24 hours 03/01/17 1126     03/01/17 1130  vancomycin (VANCOCIN) 1,500 mg in sodium chloride 0.9 % 500 mL IVPB     1,500 mg 250 mL/hr over 120 Minutes Intravenous  Once 03/01/17 1126 03/01/17 1344   03/01/17 0600  piperacillin-tazobactam (ZOSYN) IVPB 3.375 g     3.375 g 12.5 mL/hr over 240 Minutes Intravenous Every 8 hours 03/01/17 0124     02/28/17 2300  piperacillin-tazobactam (ZOSYN) IVPB 3.375 g     3.375 g 100 mL/hr over 30 Minutes Intravenous  Once 02/28/17 2246 02/28/17 2332      Assessment/Plan: s/p * No surgery found * For perc chole tube today  LOS: 2 days    Clovis Riley 03/02/2017

## 2017-03-02 NOTE — Sedation Documentation (Signed)
Prior to procedure pt complains of being cold, pt is shivering. Pt denies pain, pt is afebrile.

## 2017-03-02 NOTE — Progress Notes (Signed)
Progress Note  Patient Name: Robert [name] Date of Encounter: 03/02/2017  Primary Cardiologist: varanasi  Subjective   No chest pain. Feels better. No fever or chills. No sob. "I want something to eat".  Inpatient Medications    Scheduled Meds: . chlorhexidine  15 mL Mouth Rinse BID  . famotidine  20 mg Oral QHS  . insulin aspart  0-15 Units Subcutaneous Q4H  . insulin glargine  7 Units Subcutaneous QHS  . mouth rinse  15 mL Mouth Rinse q12n4p  . pravastatin  80 mg Oral Daily   Continuous Infusions: . sodium chloride 75 mL/hr (03/01/17 1900)  . fluconazole (DIFLUCAN) IV Stopped (03/01/17 1244)  . phenylephrine (NEO-SYNEPHRINE) Adult infusion 30 mcg/min (03/02/17 0915)  . piperacillin-tazobactam (ZOSYN)  IV Stopped (03/02/17 0917)  . [START ON 03/03/2017] vancomycin     PRN Meds: acetaminophen **OR** acetaminophen, ALPRAZolam, ondansetron **OR** ondansetron (ZOFRAN) IV   Vital Signs    Vitals:   03/02/17 0900 03/02/17 0915 03/02/17 0930 03/02/17 0945  BP: (!) 97/38 (!) 74/52 (!) 104/55 (!) 112/57  Pulse: 80 82 85 87  Resp: (!) 21 (!) 22 16 (!) 26  Temp:      TempSrc:      SpO2: 98% 96% 96% 94%  Weight:      Height:        Intake/Output Summary (Last 24 hours) at 03/02/17 0953 Last data filed at 03/02/17 0900  Gross per 24 hour  Intake          4981.33 ml  Output             1350 ml  Net          3631.33 ml   Filed Weights   03/01/17 0039 03/01/17 0637 03/02/17 0500  Weight: 170 lb 13.7 oz (77.5 kg) 176 lb 2.4 oz (79.9 kg) 187 lb 13.3 oz (85.2 kg)    Telemetry    nsr - Personally Reviewed  ECG    nsr with no STT changes - Personally Reviewed  Physical Exam   GEN: No acute distress.  Elderly appearing Neck: 7 cm JVD Cardiac: RRR, no murmurs, rubs, or gallops.  Respiratory: Clear to auscultation bilaterally. GI: Soft, nontender, non-distended  MS: No edema; No deformity. Neuro:  Nonfocal  Psych: Normal affect   Labs    Chemistry Recent  Labs Lab 02/28/17 1453 02/28/17 2344 03/01/17 0234 03/02/17 0543  NA 138  --  139 143  K 4.0  --  4.3 3.9  CL 101  --  99* 111  CO2 23  --  24 22  GLUCOSE 272*  --  300* 144*  BUN 37*  --  40* 35*  CREATININE 2.68*  --  2.78* 2.58*  CALCIUM 10.2  --  9.3 7.9*  PROT  --  7.3 6.7 5.2*  ALBUMIN  --  4.3 4.0 2.8*  AST  --  21 32 63*  ALT  --  13* 13* 55  ALKPHOS  --  50 53 49  BILITOT  --  2.4* 2.9* 1.5*  GFRNONAA 20*  --  19* 21*  GFRAA 23*  --  22* 24*  ANIONGAP 14  --  16* 10     Hematology Recent Labs Lab 02/28/17 1453 03/01/17 0234 03/02/17 0543  WBC 16.8* 17.2* 25.8*  RBC 3.24* 3.04*  3.04* 2.46*  HGB 11.4* 10.5* 8.5*  HCT 34.1* 32.6* 26.8*  MCV 105.2* 107.2* 108.9*  MCH 35.2* 34.5* 34.6*  MCHC 33.4 32.2 31.7  RDW 17.0* 17.2* 17.7*  PLT 383 286 225    Cardiac Enzymes Recent Labs Lab 03/01/17 1011 03/01/17 1920 03/01/17 2308 03/02/17 0543  TROPONINI 2.24* 2.47* 2.21* 1.69*    Recent Labs Lab 02/28/17 1508 02/28/17 1921  TROPIPOC 0.01 0.01     BNPNo results for input(s): BNP, PROBNP in the last 168 hours.   DDimer No results for input(s): DDIMER in the last 168 hours.   Radiology    Ct Abdomen Pelvis Wo Contrast  Result Date: 02/28/2017 CLINICAL DATA:  Right upper quadrant and epigastric abdominal pain. Nausea, vomiting and diarrhea. History of cholecystitis treated with percutaneous cholecystostomy. EXAM: CT ABDOMEN AND PELVIS WITHOUT CONTRAST TECHNIQUE: Multidetector CT imaging of the abdomen and pelvis was performed following the standard protocol without IV contrast. COMPARISON:  CT 05/09/2015 FINDINGS: Lower chest: No consolidation or pleural fluid. Coronary artery calcifications are seen. Hepatobiliary: Multiple intraluminal gallstones. Suggestion of mild gallbladder wall thickening and pericholecystic soft tissue stranding. No biliary dilatation. No focal hepatic lesion allowing for lack contrast. Small peripheral metallic density about the  right lobe of the liver, unchanged from prior exam. Scattered hepatic granulomas. Pancreas: No ductal dilatation or inflammation. Parenchymal atrophy. Spleen: Normal in size without focal abnormality. Adrenals/Urinary Tract: Mild left adrenal thickening without discrete nodule. Thinning of bilateral renal parenchyma. No hydronephrosis or perinephric edema. Ureters are decompressed. Urinary bladder is physiologically distended, no wall thickening. Stomach/Bowel: Mild sigmoid colonic diverticulosis without diverticulitis. Normal appendix. No small bowel dilatation or inflammation. There is a 12 mm soft tissue nodular density in the second portion of the duodenum. Vascular/Lymphatic: Aortic and branch atherosclerosis. No aneurysm. No adenopathy. Reproductive: Enlarged prostate gland spanning 6.3 cm. Brachytherapy seeds. Other: No ascites or free air. Tacks in the lower anterior abdominal wall post hernia repair. No evidence of recurrent hernia. Musculoskeletal: There are no acute or suspicious osseous abnormalities. Degenerative change at L4-L5 and L5-S1. IMPRESSION: 1. Cholelithiasis with probable gallbladder wall thickening and mild pericholecystic inflammation, suspicious for acute cholecystitis. No biliary dilatation. 2. Nodular 12 mm soft tissue density in the second portion of the duodenum, while this may reflect focal fold thickening, endoscopic evaluation may be helpful to exclude focal mass. 3. Aortic atherosclerosis.  Coronary artery calcifications. Electronically Signed   By: Jeb Levering M.D.   On: 02/28/2017 21:41   Dg Chest 2 View  Result Date: 02/28/2017 CLINICAL DATA:  Chest pain for 2 days EXAM: CHEST  2 VIEW COMPARISON:  05/11/2015 FINDINGS: Cardiac shadows within normal limits. The lungs are well aerated bilaterally. Patchy chronic changes are noted in the right upper lobe. Chronic blunting of the right costophrenic angle is noted. No focal infiltrate or sizable effusion is seen. No acute bony  abnormality is noted. IMPRESSION: No acute abnormality seen. Electronically Signed   By: Inez Catalina M.D.   On: 02/28/2017 16:03   Nm Hepatobiliary Liver Func  Result Date: 03/01/2017 CLINICAL DATA:  83 year old male with abdominal pain and nausea vomiting. EXAM: NUCLEAR MEDICINE HEPATOBILIARY IMAGING TECHNIQUE: Sequential images of the abdomen were obtained out to 60 minutes following intravenous administration of radiopharmaceutical. 3 mg of morphine IV was administered after the initial first hour. RADIOPHARMACEUTICALS:  5.43 mCi Tc-87m  Choletec IV COMPARISON:  None. FINDINGS: Prompt homogeneous hepatic uptake is identified. Small bowel activity is identified at 15 minutes. Gallbladder activity is not identified even after the administration of morphine. IMPRESSION: Nonvisualization of the gallbladder compatible with cystic duct obstruction/ acute cholecystitis. Electronically Signed   By: Cleatis Polka.D.  On: 03/01/2017 19:51   US Abdomen Complete  Result Date: 03/01/2017 CLINICAL DATA:  Abdominal pain and hypotension EXAM: ABDOMEN ULTRASOUND COMPLETE COMPARISON:  CT abdomen and pelvis February 28, 2017 FINDINGS: Gallbladder: Within the gallbladder, there are multiple echogenic foci which move and shadow consistent with cholelithiasis. Largest gallstone measures 2.0 cm in length. The gallbladder wall appears slightly thickened. There is no demonstrable pericholecystic fluid. Note that there is sludge in the gallbladder as well. No sonographic Murphy sign noted by sonographer. Common bile duct: Diameter: 4 mm. No intrahepatic, common hepatic, or common bile duct dilatation. Liver: No focal lesion identified. Within normal limits in parenchymal echogenicity. IVC: No abnormality visualized. Pancreas: Visualized portion unremarkable. Much of the pancreas is obscured by gas. Spleen: Size and appearance within normal limits. Right Kidney: Length: 9.5 cm. Echogenicity within normal limits. There is renal cortical  thinning. No mass or hydronephrosis visualized. Left Kidney: Length: 8.8 cm. Echogenicity within normal limits. There is renal cortical thinning. No mass or hydronephrosis visualized. Abdominal aorta: No aneurysm visualized. Other findings: No demonstrable ascites. IMPRESSION: Gallstones with gallbladder wall is slightly thickened. No pericholecystic fluid demonstrated. The appearance currently may be indicative of early cholecystitis. Portions of pancreas obscured by gas. Visualized portions of pancreas appear unremarkable. Small kidneys with renal cortical thinning. These findings may be indicative of a degree of underlying medical renal disease. No renal obstructing focus on either side. Electronically Signed   By: Lowella Grip III M.D.   On: 03/01/2017 11:34    Cardiac Studies   Echo pending  Patient Profile     83 y.o. male admitted with acute cholecystitis and chest pain and type 2 NSTEMI.   Assessment & Plan    1. NSTEMI - the enzyme elevation is due to his underlying CAD in the setting of acute cholecystitis. No indication for additional evaluation at this time.  2. Chronic systolic heart failure - he appears euvolemic. Restart outpatient meds when he is able to take oral intake. 3. MR - by echo. He has a repeat echo pending.  4. Surgical preop - he would be a very poor candidate for open cholecystectomy. I suspect another drain would be the best approach to his biliary infection.  Signed, Cristopher Peru, MD  03/02/2017, 9:53 AM  Patient ID: [name], male   DOB: 1928-08-21, 83 y.o.   MRN: [id number]

## 2017-03-02 NOTE — Progress Notes (Signed)
PULMONARY / CRITICAL CARE MEDICINE   Name: Robert [name] MRN: [id number] DOB: September 29, 1927    ADMISSION DATE:  02/28/2017 CONSULTATION DATE:  6/8  REFERRING MD:  [facility]  CHIEF COMPLAINT:  Abd pain N/V/D from food.  HISTORY OF PRESENT ILLNESS:   [name] is a 83 yo gentleman who lives independently and eats all 3 meals out daily([employer] gives him diarrhea) who was in his normal state of health till he ate a barbeque sandwich 6/7 which caused N/V/D along with AMS. He was on lasix 80 mg daily and continued to take lasix(CHF/ef 40%) during N/V/D. He was taken to ED for evaluation and found to have hypotension and was treated with fluids. Note : He had a similar episode 2 years ago that required a perq chole drain as he was deemed not a surgical candidate.His CT abd this admit again reveals cholecystis. He has had 2.5 litres of IVF but has proven refractory to treatment and remains hypotensive. He has been moved to ICU and PCCM will assume his care till stable. Further note he had prostate cancer 10 years ago and did received 40 rtx treatments.   SUBJECTIVE:  Denies any pain No problems overnight reported Feels like he has more energy today.  Very hungry and thirsty Phenylephrine started via peripheral IV overnight Hida scan performed as below  VITAL SIGNS: BP (!) 112/57   Pulse 87   Temp 98.1 F (36.7 C) (Oral)   Resp (!) 26   Ht 6' (1.829 m)   Wt 85.2 kg (187 lb 13.3 oz)   SpO2 94%   BMI 25.47 kg/m   HEMODYNAMICS:    VENTILATOR SETTINGS:    INTAKE / OUTPUT: I/O last 3 completed shifts: In: 7096.3 [I.V.:2538; Other:2000; IV Piggyback:2558.3] Out: 1151 [Urine:1150; Stool:1]  PHYSICAL EXAMINATION: General:  Elderly gentleman, no distress Neuro:  Awake, alert, interacts appropriately, moves all extremities HEENT: Oropharynx is dry, no JVD Cardiovascular:  Regular, no murmur Lungs: Lungs clear bilaterally Abdomen: No tenderness at rest but some mild right upper quadrant  tenderness with deep palpation, positive bowel sounds Musculoskeletal:  No deformity Skin: Warm, no rash  LABS:  BMET  Recent Labs Lab 02/28/17 1453 03/01/17 0234 03/02/17 0543  NA 138 139 143  K 4.0 4.3 3.9  CL 101 99* 111  CO2 23 24 22   BUN 37* 40* 35*  CREATININE 2.68* 2.78* 2.58*  GLUCOSE 272* 300* 144*    Electrolytes  Recent Labs Lab 02/28/17 1453 03/01/17 0234 03/02/17 0543  CALCIUM 10.2 9.3 7.9*    CBC  Recent Labs Lab 02/28/17 1453 03/01/17 0234 03/02/17 0543  WBC 16.8* 17.2* 25.8*  HGB 11.4* 10.5* 8.5*  HCT 34.1* 32.6* 26.8*  PLT 383 286 225    Coag's  Recent Labs Lab 03/02/17 0543  INR 1.32    Sepsis Markers  Recent Labs Lab 03/01/17 0719 03/01/17 1011 03/02/17 0543  LATICACIDVEN 3.6* 2.3* 1.4    ABG No results for input(s): PHART, PCO2ART, PO2ART in the last 168 hours.  Liver Enzymes  Recent Labs Lab 02/28/17 2344 03/01/17 0234 03/02/17 0543  AST 21 32 63*  ALT 13* 13* 55  ALKPHOS 50 53 49  BILITOT 2.4* 2.9* 1.5*  ALBUMIN 4.3 4.0 2.8*    Cardiac Enzymes  Recent Labs Lab 03/01/17 1920 03/01/17 2308 03/02/17 0543  TROPONINI 2.47* 2.21* 1.69*    Glucose  Recent Labs Lab 03/01/17 1113 03/01/17 1545 03/01/17 1926 03/01/17 2304 03/02/17 0315 03/02/17 0738  GLUCAP 136* 181* 172* 137*  112* 146*    Imaging Nm Hepatobiliary Liver Func  Result Date: 03/01/2017 CLINICAL DATA:  83 year old male with abdominal pain and nausea vomiting. EXAM: NUCLEAR MEDICINE HEPATOBILIARY IMAGING TECHNIQUE: Sequential images of the abdomen were obtained out to 60 minutes following intravenous administration of radiopharmaceutical. 3 mg of morphine IV was administered after the initial first hour. RADIOPHARMACEUTICALS:  5.43 mCi Tc-5m  Choletec IV COMPARISON:  None. FINDINGS: Prompt homogeneous hepatic uptake is identified. Small bowel activity is identified at 15 minutes. Gallbladder activity is not identified even after the  administration of morphine. IMPRESSION: Nonvisualization of the gallbladder compatible with cystic duct obstruction/ acute cholecystitis. Electronically Signed   By: Margarette Canada M.D.   On: 03/01/2017 19:51   US Abdomen Complete  Result Date: 03/01/2017 CLINICAL DATA:  Abdominal pain and hypotension EXAM: ABDOMEN ULTRASOUND COMPLETE COMPARISON:  CT abdomen and pelvis February 28, 2017 FINDINGS: Gallbladder: Within the gallbladder, there are multiple echogenic foci which move and shadow consistent with cholelithiasis. Largest gallstone measures 2.0 cm in length. The gallbladder wall appears slightly thickened. There is no demonstrable pericholecystic fluid. Note that there is sludge in the gallbladder as well. No sonographic Murphy sign noted by sonographer. Common bile duct: Diameter: 4 mm. No intrahepatic, common hepatic, or common bile duct dilatation. Liver: No focal lesion identified. Within normal limits in parenchymal echogenicity. IVC: No abnormality visualized. Pancreas: Visualized portion unremarkable. Much of the pancreas is obscured by gas. Spleen: Size and appearance within normal limits. Right Kidney: Length: 9.5 cm. Echogenicity within normal limits. There is renal cortical thinning. No mass or hydronephrosis visualized. Left Kidney: Length: 8.8 cm. Echogenicity within normal limits. There is renal cortical thinning. No mass or hydronephrosis visualized. Abdominal aorta: No aneurysm visualized. Other findings: No demonstrable ascites. IMPRESSION: Gallstones with gallbladder wall is slightly thickened. No pericholecystic fluid demonstrated. The appearance currently may be indicative of early cholecystitis. Portions of pancreas obscured by gas. Visualized portions of pancreas appear unremarkable. Small kidneys with renal cortical thinning. These findings may be indicative of a degree of underlying medical renal disease. No renal obstructing focus on either side. Electronically Signed   By: Lowella Grip  III M.D.   On: 03/01/2017 11:34     STUDIES:  6/7 ct abd as noted 6/8 abd US>> gallstones with some slight thickening of the gallbladder wall without any periCholecystic fluid Hida scan 6/8 >> no contrast uptake in the gallbladder consistent with cystic duct obstruction and acute cholecystitis  CULTURES: 6/8 bc>>  ANTIBIOTICS:  6/8 pip-tazo>> 6/8 diflucan>> Vancomycin 6/8 >> 6/9  SIGNIFICANT EVENTS: 6/7 [facility]  LINES/TUBES: PIV  DISCUSSION: [name] is a 83 yo gentleman who lives independently and eats all 3 meals out daily([employer] gives him diarrhea) who was in his normal state of health till he ate a barbeque sandwich 6/7 which caused N/V/D along with AMS. He was on lasix 80 mg daily and continued to take lasix(CHF/ef 40%) during N/V/D. He was taken to ED for evaluation and found to have hypotension and was treated with fluids. Note : He had a similar episode 2 years ago that required a perq chole drain as he was deemed not a surgical candidate.His CT abd this admit again reveals cholecystis. He has had 2.5 litres of IVF but has proven refractory to treatment and remains hypotensive. He has been moved to ICU and PCCM will assume his care till stable. Further note he had prostate cancer 10 years ago and did received 40 rtx treatments.  ASSESSMENT / PLAN:  PULMONARY A: No acute issue P:   Titrate oxygen if needed Follow respiratory status, pulmonary hygiene  CARDIOVASCULAR A:  Septic shock Chronic lasix use for CHF with continued use coupled with N/V/D  CHF ef 40% CAD PVD on plavix +trop, apparent stress non-STEMI  P:  Full resuscitation Titrate phenylephrine for goal MAP greater than 65 Antibiotics as below No evidence adrenal insufficiency Appreciate cardiology input Follow echocardiogram final results   RENAL A:   Acute on CKD base creatine 2.2, improving P:   Restore adequate renal perfusion Follow BMP and urine output Avoid  nephrotoxins   GASTROINTESTINAL A:   Recurrent cholecystitis  Probable gastroenteritis GI protection P:   Nothing by mouth Pepcid Interventional radiology evaluating for percutaneous gallbladder drain   HEMATOLOGIC A:   On plavix for PVD DVT protection  P:  Plavix held for possible percutaneous drain gallbladder Follow CBC SCD in place  INFECTIOUS A:   Sepsis from presumed acute cholecystitis CT abd with cholelithiasis, apparent common bile duct obstruction Cholecystitis hx that required perq drain with deferred surgery, surgery and IR following P:   Plan for percutaneous drainage via IR Antimicrobials Zosyn and fluconazole  ENDOCRINE A:   IDDM  P:   Sliding scale insulin as ordered  NEUROLOGIC A:   Awake and alert No Hx of neurological compromise  P:   RASS goal: 0   FAMILY  - Updates: Patient and son updated 6/9  - Inter-disciplinary family meet or Palliative Care meeting due by:  day 7  Independent CC time 33 minutes  Baltazar Apo, MD, PhD 03/02/2017, 10:07 AM  Pulmonary and Critical Care [phone] or if no answer [phone]
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
## 2017-03-02 NOTE — Procedures (Signed)
10 Fr Cholecystostomy Pus EBL 0 Comp 0

## 2017-03-02 NOTE — Progress Notes (Signed)
Echocardiogram 2D Echocardiogram has been performed.  [place], [place] 03/02/2017, 10:09 AM

## 2017-03-03 DIAGNOSIS — E1129 Type 2 diabetes mellitus with other diabetic kidney complication: Secondary | ICD-10-CM

## 2017-03-03 DIAGNOSIS — Z794 Long term (current) use of insulin: Secondary | ICD-10-CM

## 2017-03-03 DIAGNOSIS — N184 Chronic kidney disease, stage 4 (severe): Secondary | ICD-10-CM

## 2017-03-03 DIAGNOSIS — I5022 Chronic systolic (congestive) heart failure: Secondary | ICD-10-CM

## 2017-03-03 LAB — GLUCOSE, CAPILLARY
GLUCOSE-CAPILLARY: 106 mg/dL — AB (ref 65–99)
GLUCOSE-CAPILLARY: 123 mg/dL — AB (ref 65–99)
Glucose-Capillary: 170 mg/dL — ABNORMAL HIGH (ref 65–99)
Glucose-Capillary: 161 mg/dL — ABNORMAL HIGH (ref 65–99)
Glucose-Capillary: 172 mg/dL — ABNORMAL HIGH (ref 65–99)

## 2017-03-03 LAB — CBC
HEMATOCRIT: 26.8 % — AB (ref 39.0–52.0)
HEMOGLOBIN: 8.4 g/dL — AB (ref 13.0–17.0)
MCH: 35.1 pg — ABNORMAL HIGH (ref 26.0–34.0)
MCHC: 31.3 g/dL (ref 30.0–36.0)
MCV: 112.1 fL — AB (ref 78.0–100.0)
Platelets: 218 10*3/uL (ref 150–400)
RBC: 2.39 MIL/uL — ABNORMAL LOW (ref 4.22–5.81)
RDW: 18.3 % — ABNORMAL HIGH (ref 11.5–15.5)
WBC: 17.6 10*3/uL — AB (ref 4.0–10.5)

## 2017-03-03 LAB — BASIC METABOLIC PANEL
ANION GAP: 7 (ref 5–15)
BUN: 29 mg/dL — ABNORMAL HIGH (ref 6–20)
CHLORIDE: 114 mmol/L — AB (ref 101–111)
CO2: 21 mmol/L — AB (ref 22–32)
Calcium: 7.9 mg/dL — ABNORMAL LOW (ref 8.9–10.3)
Creatinine, Ser: 2.54 mg/dL — ABNORMAL HIGH (ref 0.61–1.24)
GFR calc non Af Amer: 21 mL/min — ABNORMAL LOW (ref >60)
GFR, EST AFRICAN AMERICAN: 25 mL/min — AB (ref >60)
Glucose, Bld: 112 mg/dL — ABNORMAL HIGH (ref 65–99)
Potassium: 4 mmol/L (ref 3.5–5.1)
Sodium: 142 mmol/L (ref 135–145)

## 2017-03-03 LAB — MAGNESIUM: Magnesium: 2.5 mg/dL — ABNORMAL HIGH (ref 1.7–2.4)

## 2017-03-03 MED ORDER — MORPHINE SULFATE (PF) 2 MG/ML IV SOLN
1.0000 mg | INTRAVENOUS | Status: DC | PRN
  Administered 2017-03-03 – 2017-03-04 (×3): 2 mg via INTRAVENOUS
  Filled 2017-03-03 (×3): qty 1

## 2017-03-03 NOTE — Progress Notes (Signed)
PULMONARY / CRITICAL CARE MEDICINE   Name: Robert [name] MRN: [id number] DOB: Jun 29, 1928    ADMISSION DATE:  02/28/2017 CONSULTATION DATE:  6/8  REFERRING MD:  [facility]  CHIEF COMPLAINT:  Abd pain N/V/D from food.  HISTORY OF PRESENT ILLNESS:   [name] is a 83 yo gentleman who lives independently and eats all 3 meals out daily([employer] gives him diarrhea) who was in his normal state of health till he ate a barbeque sandwich 6/7 which caused N/V/D along with AMS. He was on lasix 80 mg daily and continued to take lasix(CHF/ef 40%) during N/V/D. He was taken to ED for evaluation and found to have hypotension and was treated with fluids. Note : He had a similar episode 2 years ago that required a perq chole drain as he was deemed not a surgical candidate.His CT abd this admit again reveals cholecystis. He has had 2.5 litres of IVF but has proven refractory to treatment and remains hypotensive. He has been moved to ICU and PCCM will assume his care till stable. Further note he had prostate cancer 10 years ago and did received 40 rtx treatments.   SUBJECTIVE:  Status post gallbladder drain placement 6/9 Complains of right upper quadrant pain Poor sleep last night Currently on phenylephrine 25   VITAL SIGNS: BP 107/66 (BP Location: Left Arm)   Pulse 75   Temp 98.4 F (36.9 C) (Oral)   Resp 18   Ht 6' (1.829 m)   Wt 87.1 kg (192 lb 0.3 oz)   SpO2 96%   BMI 26.04 kg/m   HEMODYNAMICS:    VENTILATOR SETTINGS:    INTAKE / OUTPUT: I/O last 3 completed shifts: In: 5254.4 [P.O.:400; I.V.:4504.4; IV Piggyback:350] Out: 1140 [Urine:1100; Drains:40]  PHYSICAL EXAMINATION: General:  Elderly gentleman, in no distress Neuro:  Awake, alert, interacts appropriately, moves all extremities HEENT: Oropharynx moist, no lesions, no JVD Cardiovascular: Regular, no murmur Lungs: Clear bilaterally Abdomen: Drain in place with blood-tinged fluid, tender to palpation on the right side, positive  bowel sounds Musculoskeletal:  No deformity Skin: Warm, no rash  LABS:  BMET  Recent Labs Lab 03/01/17 0234 03/02/17 0543 03/03/17 0304  NA 139 143 142  K 4.3 3.9 4.0  CL 99* 111 114*  CO2 24 22 21*  BUN 40* 35* 29*  CREATININE 2.78* 2.58* 2.54*  GLUCOSE 300* 144* 112*    Electrolytes  Recent Labs Lab 03/01/17 0234 03/02/17 0543 03/03/17 0304  CALCIUM 9.3 7.9* 7.9*  MG  --   --  2.5*    CBC  Recent Labs Lab 03/01/17 0234 03/02/17 0543 03/03/17 0304  WBC 17.2* 25.8* 17.6*  HGB 10.5* 8.5* 8.4*  HCT 32.6* 26.8* 26.8*  PLT 286 225 218    Coag's  Recent Labs Lab 03/02/17 0543  INR 1.32    Sepsis Markers  Recent Labs Lab 03/01/17 0719 03/01/17 1011 03/02/17 0543  LATICACIDVEN 3.6* 2.3* 1.4    ABG No results for input(s): PHART, PCO2ART, PO2ART in the last 168 hours.  Liver Enzymes  Recent Labs Lab 02/28/17 2344 03/01/17 0234 03/02/17 0543  AST 21 32 63*  ALT 13* 13* 55  ALKPHOS 50 53 49  BILITOT 2.4* 2.9* 1.5*  ALBUMIN 4.3 4.0 2.8*    Cardiac Enzymes  Recent Labs Lab 03/01/17 1920 03/01/17 2308 03/02/17 0543  TROPONINI 2.47* 2.21* 1.69*    Glucose  Recent Labs Lab 03/02/17 1207 03/02/17 1616 03/02/17 2021 03/02/17 2328 03/03/17 0414 03/03/17 0745  GLUCAP 180* 221* 191*  173* 106* 123*    Imaging Ir Perc Cholecystostomy  Result Date: 03/02/2017 INDICATION: Acute cholecystitis.  Sepsis. EXAM: CHOLECYSTOSTOMY MEDICATIONS: None ANESTHESIA/SEDATION: Fentanyl 25 mcg IV; Versed 1 mg IV Moderate Sedation Time:  10 The patient was continuously monitored during the procedure by the interventional radiology nurse under my direct supervision. FLUOROSCOPY TIME:  Fluoroscopy Time:  minutes 36 seconds (3 mGy). COMPLICATIONS: None immediate. PROCEDURE: Informed written consent was obtained from the patient after a thorough discussion of the procedural risks, benefits and alternatives. All questions were addressed. Maximal Sterile  Barrier Technique was utilized including caps, mask, sterile gowns, sterile gloves, sterile drape, hand hygiene and skin antiseptic. A timeout was performed prior to the initiation of the procedure. The right upper quadrant was prepped with Betadine in a sterile fashion, and a sterile drape was applied covering the operative field. A sterile gown and sterile gloves were used for the procedure. A 21 gauge needle was inserted into the gallbladder lumen under sonographic guidance and via transhepatic approach. It was removed over a 018 wire, which was upsized to a 3-J. A 10-French drain was advanced over the wire and coiled in the gallbladder lumen. It was sewn to the skin after being string fixed. Contrast was injected. FINDINGS: The cystic duct is occluded. Final imaging demonstrates placement of a 10 French drain coiled in the lumen of the gallbladder. IMPRESSION: Successful cholecystostomy. This needs to remain in place at least 6 weeks. Electronically Signed   By: Marybelle Killings M.D.   On: 03/02/2017 12:01     STUDIES:  6/7 ct abd as noted 6/8 abd US>> gallstones with some slight thickening of the gallbladder wall without any periCholecystic fluid Hida scan 6/8 >> no contrast uptake in the gallbladder consistent with cystic duct obstruction and acute cholecystitis Echocardiogram 6/9 >> inferior akinesis, LV ejection fraction 35-40%, moderate to severe MR, estimated PASP 39 mmHg  CULTURES: 6/8 bc>> Gallbladder fluid 6/9 >> GNR >>   ANTIBIOTICS:  6/8 pip-tazo>> 6/8 diflucan>> 6/10 Vancomycin 6/8 >> 6/9  SIGNIFICANT EVENTS: 6/7 admit to [facility]  LINES/TUBES: Perc Gallbladder drain 6/9 >>   DISCUSSION: [name] is a 83 yo gentleman who lives independently and eats all 3 meals out daily([employer] gives him diarrhea) who was in his normal state of health till he ate a barbeque sandwich 6/7 which caused N/V/D along with AMS. He was on lasix 80 mg daily and continued to take lasix(CHF/ef 40%) during  N/V/D. He was taken to ED for evaluation and found to have hypotension and was treated with fluids. Note : He had a similar episode 2 years ago that required a perq chole drain as he was deemed not a surgical candidate.His CT abd this admit again reveals cholecystis. He has had 2.5 litres of IVF but has proven refractory to treatment and remains hypotensive. He has been moved to ICU and PCCM will assume his care till stable. Further note he had prostate cancer 10 years ago and did received 40 rtx treatments.   ASSESSMENT / PLAN:  PULMONARY A: No acute issue P:   Pulmonary hygiene  CARDIOVASCULAR A:  Septic shock, remains on phenylephrine Chronic lasix use for CHF with continued use coupled with N/V/D  CHF ef 40% CAD PVD on plavix +trop, apparent stress non-STEMI  P:  Titrate phenylephrine for goal him AP greater than 65 Gallbladder drain in place Antibiotics as below   RENAL A:   Acute on CKD base creatine 2.2, improving P:   Continue adequate  renal perfusion Follow BMP and urine output Avoid nephrotoxins   GASTROINTESTINAL A:   Recurrent cholecystitis  Probable gastroenteritis GI protection P:   Attempt to advance to full liquid diet Pepcid Gallbladder drain in place Appreciate IR and surgery assistance   HEMATOLOGIC A:   On plavix for PVD DVT protection  P:  Consider restart Plavix on 6/11 if no plans for further invasive intervention Follow CBC SCD in place  INFECTIOUS A:   Sepsis from acute cholecystitis CT abd with cholelithiasis, apparent common bile duct obstruction Cholecystitis hx that required perq drain with deferred surgery, surgery and IR following P:   Gallbladder drain in place Continue Zosyn Stop fluconazole on 6/10  ENDOCRINE A:   IDDM  P:   SSI as ordered  NEUROLOGIC A:   Pain  No Hx of neurological compromise  P:   RASS goal: 0 Start low dose morphine 6/10  FAMILY  - Updates: Patient and son updated 6/9 and 6/10  -  Inter-disciplinary family meet or Palliative Care meeting due by:  day 7  Independent CC time 31 minutes  Baltazar Apo, MD, PhD 03/03/2017, 8:35 AM [facility] [phone] or if no answer [phone]

## 2017-03-03 NOTE — Progress Notes (Signed)
Progress Note  Patient Name: Robert [name] Date of Encounter: 03/03/2017  Primary Cardiologist: [place] comfortably but awakens to stimuli. No complaints, s/p cholecystocopy with percutaneous drain placement.  Inpatient Medications    Scheduled Meds: . chlorhexidine  15 mL Mouth Rinse BID  . famotidine  20 mg Oral QHS  . insulin aspart  0-15 Units Subcutaneous Q4H  . insulin glargine  7 Units Subcutaneous QHS  . mouth rinse  15 mL Mouth Rinse q12n4p  . pravastatin  80 mg Oral Daily   Continuous Infusions: . sodium chloride 75 mL/hr at 03/03/17 0523  . phenylephrine (NEO-SYNEPHRINE) Adult infusion 20 mcg/min (03/03/17 0840)  . piperacillin-tazobactam (ZOSYN)  IV Stopped (03/03/17 1000)   PRN Meds: acetaminophen **OR** acetaminophen, ALPRAZolam, morphine injection, ondansetron **OR** ondansetron (ZOFRAN) IV   Vital Signs    Vitals:   03/03/17 0830 03/03/17 0845 03/03/17 0900 03/03/17 0916  BP: (!) 118/58 111/65 (!) 114/57 (!) 106/53  Pulse: 77 75 75 77  Resp: (!) 33 (!) 25 (!) 27 (!) 23  Temp:      TempSrc:      SpO2: 99% 99% 100% 100%  Weight:      Height:        Intake/Output Summary (Last 24 hours) at 03/03/17 1008 Last data filed at 03/03/17 0800  Gross per 24 hour  Intake          3377.63 ml  Output              390 ml  Net          2987.63 ml   Filed Weights   03/01/17 0637 03/02/17 0500 03/03/17 0500  Weight: 176 lb 2.4 oz (79.9 kg) 187 lb 13.3 oz (85.2 kg) 192 lb 0.3 oz (87.1 kg)    Telemetry    nsr - Personally Reviewed  ECG    none - Personally Reviewed  Physical Exam   GEN: No acute distress.  somnolent Neck: 6 cm JVD Cardiac: RRR, no murmurs, rubs, or gallops.  Respiratory: Clear to auscultation bilaterally. GI: Soft, nontender, non-distended  MS: No edema; No deformity. Neuro:  Nonfocal  Psych: Normal affect   Labs    Chemistry Recent Labs Lab 02/28/17 2344 03/01/17 0234 03/02/17 0543 03/03/17 0304    NA  --  139 143 142  K  --  4.3 3.9 4.0  CL  --  99* 111 114*  CO2  --  24 22 21*  GLUCOSE  --  300* 144* 112*  BUN  --  40* 35* 29*  CREATININE  --  2.78* 2.58* 2.54*  CALCIUM  --  9.3 7.9* 7.9*  PROT 7.3 6.7 5.2*  --   ALBUMIN 4.3 4.0 2.8*  --   AST 21 32 63*  --   ALT 13* 13* 55  --   ALKPHOS 50 53 49  --   BILITOT 2.4* 2.9* 1.5*  --   GFRNONAA  --  19* 21* 21*  GFRAA  --  22* 24* 25*  ANIONGAP  --  16* 10 7     Hematology Recent Labs Lab 03/01/17 0234 03/02/17 0543 03/03/17 0304  WBC 17.2* 25.8* 17.6*  RBC 3.04*  3.04* 2.46* 2.39*  HGB 10.5* 8.5* 8.4*  HCT 32.6* 26.8* 26.8*  MCV 107.2* 108.9* 112.1*  MCH 34.5* 34.6* 35.1*  MCHC 32.2 31.7 31.3  RDW 17.2* 17.7* 18.3*  PLT 286 225 218    Cardiac Enzymes Recent Labs Lab  03/01/17 1011 03/01/17 1920 03/01/17 2308 03/02/17 0543  TROPONINI 2.24* 2.47* 2.21* 1.69*    Recent Labs Lab 02/28/17 1508 02/28/17 1921  TROPIPOC 0.01 0.01     BNPNo results for input(s): BNP, PROBNP in the last 168 hours.   DDimer No results for input(s): DDIMER in the last 168 hours.   Radiology    Nm Hepatobiliary Liver Func  Result Date: 03/01/2017 CLINICAL DATA:  83 year old male with abdominal pain and nausea vomiting. EXAM: NUCLEAR MEDICINE HEPATOBILIARY IMAGING TECHNIQUE: Sequential images of the abdomen were obtained out to 60 minutes following intravenous administration of radiopharmaceutical. 3 mg of morphine IV was administered after the initial first hour. RADIOPHARMACEUTICALS:  5.43 mCi Tc-38m  Choletec IV COMPARISON:  None. FINDINGS: Prompt homogeneous hepatic uptake is identified. Small bowel activity is identified at 15 minutes. Gallbladder activity is not identified even after the administration of morphine. IMPRESSION: Nonvisualization of the gallbladder compatible with cystic duct obstruction/ acute cholecystitis. Electronically Signed   By: Margarette Canada M.D.   On: 03/01/2017 19:51   US Abdomen Complete  Result  Date: 03/01/2017 CLINICAL DATA:  Abdominal pain and hypotension EXAM: ABDOMEN ULTRASOUND COMPLETE COMPARISON:  CT abdomen and pelvis February 28, 2017 FINDINGS: Gallbladder: Within the gallbladder, there are multiple echogenic foci which move and shadow consistent with cholelithiasis. Largest gallstone measures 2.0 cm in length. The gallbladder wall appears slightly thickened. There is no demonstrable pericholecystic fluid. Note that there is sludge in the gallbladder as well. No sonographic Murphy sign noted by sonographer. Common bile duct: Diameter: 4 mm. No intrahepatic, common hepatic, or common bile duct dilatation. Liver: No focal lesion identified. Within normal limits in parenchymal echogenicity. IVC: No abnormality visualized. Pancreas: Visualized portion unremarkable. Much of the pancreas is obscured by gas. Spleen: Size and appearance within normal limits. Right Kidney: Length: 9.5 cm. Echogenicity within normal limits. There is renal cortical thinning. No mass or hydronephrosis visualized. Left Kidney: Length: 8.8 cm. Echogenicity within normal limits. There is renal cortical thinning. No mass or hydronephrosis visualized. Abdominal aorta: No aneurysm visualized. Other findings: No demonstrable ascites. IMPRESSION: Gallstones with gallbladder wall is slightly thickened. No pericholecystic fluid demonstrated. The appearance currently may be indicative of early cholecystitis. Portions of pancreas obscured by gas. Visualized portions of pancreas appear unremarkable. Small kidneys with renal cortical thinning. These findings may be indicative of a degree of underlying medical renal disease. No renal obstructing focus on either side. Electronically Signed   By: Lowella Grip III M.D.   On: 03/01/2017 11:34   Ir Perc Cholecystostomy  Result Date: 03/02/2017 INDICATION: Acute cholecystitis.  Sepsis. EXAM: CHOLECYSTOSTOMY MEDICATIONS: None ANESTHESIA/SEDATION: Fentanyl 25 mcg IV; Versed 1 mg IV Moderate  Sedation Time:  10 The patient was continuously monitored during the procedure by the interventional radiology nurse under my direct supervision. FLUOROSCOPY TIME:  Fluoroscopy Time:  minutes 36 seconds (3 mGy). COMPLICATIONS: None immediate. PROCEDURE: Informed written consent was obtained from the patient after a thorough discussion of the procedural risks, benefits and alternatives. All questions were addressed. Maximal Sterile Barrier Technique was utilized including caps, mask, sterile gowns, sterile gloves, sterile drape, hand hygiene and skin antiseptic. A timeout was performed prior to the initiation of the procedure. The right upper quadrant was prepped with Betadine in a sterile fashion, and a sterile drape was applied covering the operative field. A sterile gown and sterile gloves were used for the procedure. A 21 gauge needle was inserted into the gallbladder lumen under sonographic guidance and via transhepatic  approach. It was removed over a 018 wire, which was upsized to a 3-J. A 10-French drain was advanced over the wire and coiled in the gallbladder lumen. It was sewn to the skin after being string fixed. Contrast was injected. FINDINGS: The cystic duct is occluded. Final imaging demonstrates placement of a 10 French drain coiled in the lumen of the gallbladder. IMPRESSION: Successful cholecystostomy. This needs to remain in place at least 6 weeks. Electronically Signed   By: Marybelle Killings M.D.   On: 03/02/2017 12:01    Cardiac Studies   none  Patient Profile     83 y.o. male admitted with chest pain and found to have acute cholecystitis with sepsis, s/p cholecystomy drain, with elevated cardiac enzymes thought to due stress induced NSTEMI.  Assessment & Plan    1. NSTEMI - his pressure remain soft and he is requiring pressors which I think are due to sepsis from infected gall bladder. No indication for any invasive evaluation at this time. 2. Sepsis - he remains on pressors and  anti-biotics. Hopefully with drain placement his pressure will improve 3. Chronic systolic heart failure - no evidence of volume overload. I suspect his LV dysfunction is contributing to his soft pressures.  Signed, Cristopher Peru, MD  03/03/2017, 10:08 AM  Patient ID: Robert [name], male   DOB: 1928-06-19, 83 y.o.   MRN: [id number]

## 2017-03-03 NOTE — Progress Notes (Signed)
Subjective/Chief Complaint: CC RUQ pain a little less, sore chest wall   Objective: Vital signs in last 24 hours: Temp:  [97.9 F (36.6 C)-102.5 F (39.2 C)] 98.4 F (36.9 C) (06/10 0742) Pulse Rate:  [39-110] 68 (06/10 0715) Resp:  [16-36] 22 (06/10 0715) BP: (74-144)/(33-87) 106/59 (06/10 0715) SpO2:  [89 %-100 %] 99 % (06/10 0715) Weight:  [87.1 kg (192 lb 0.3 oz)] 87.1 kg (192 lb 0.3 oz) (06/10 0500) Last BM Date: 03/02/17  Intake/Output from previous day: 06/09 0701 - 06/10 0700 In: 3363.5 [P.O.:400; I.V.:2713.5; IV Piggyback:250] Out: 590 [Urine:550; Drains:40] Intake/Output this shift: No intake/output data recorded.  General appearance: alert and cooperative Resp: clear to auscultation bilaterally Cardio: regular rate and rhythm GI: soft. Drain RUQ with output, mild tenderness  Lab Results:   Recent Labs  03/02/17 0543 03/03/17 0304  WBC 25.8* 17.6*  HGB 8.5* 8.4*  HCT 26.8* 26.8*  PLT 225 218   BMET  Recent Labs  03/02/17 0543 03/03/17 0304  NA 143 142  K 3.9 4.0  CL 111 114*  CO2 22 21*  GLUCOSE 144* 112*  BUN 35* 29*  CREATININE 2.58* 2.54*  CALCIUM 7.9* 7.9*   PT/INR  Recent Labs  03/02/17 0543  LABPROT 16.5*  INR 1.32   ABG No results for input(s): PHART, HCO3 in the last 72 hours.  Invalid input(s): PCO2, PO2  Studies/Results: Nm Hepatobiliary Liver Func  Result Date: 03/01/2017 CLINICAL DATA:  83 year old male with abdominal pain and nausea vomiting. EXAM: NUCLEAR MEDICINE HEPATOBILIARY IMAGING TECHNIQUE: Sequential images of the abdomen were obtained out to 60 minutes following intravenous administration of radiopharmaceutical. 3 mg of morphine IV was administered after the initial first hour. RADIOPHARMACEUTICALS:  5.43 mCi Tc-65m  Choletec IV COMPARISON:  None. FINDINGS: Prompt homogeneous hepatic uptake is identified. Small bowel activity is identified at 15 minutes. Gallbladder activity is not identified even after the  administration of morphine. IMPRESSION: Nonvisualization of the gallbladder compatible with cystic duct obstruction/ acute cholecystitis. Electronically Signed   By: Margarette Canada M.D.   On: 03/01/2017 19:51   US Abdomen Complete  Result Date: 03/01/2017 CLINICAL DATA:  Abdominal pain and hypotension EXAM: ABDOMEN ULTRASOUND COMPLETE COMPARISON:  CT abdomen and pelvis February 28, 2017 FINDINGS: Gallbladder: Within the gallbladder, there are multiple echogenic foci which move and shadow consistent with cholelithiasis. Largest gallstone measures 2.0 cm in length. The gallbladder wall appears slightly thickened. There is no demonstrable pericholecystic fluid. Note that there is sludge in the gallbladder as well. No sonographic Murphy sign noted by sonographer. Common bile duct: Diameter: 4 mm. No intrahepatic, common hepatic, or common bile duct dilatation. Liver: No focal lesion identified. Within normal limits in parenchymal echogenicity. IVC: No abnormality visualized. Pancreas: Visualized portion unremarkable. Much of the pancreas is obscured by gas. Spleen: Size and appearance within normal limits. Right Kidney: Length: 9.5 cm. Echogenicity within normal limits. There is renal cortical thinning. No mass or hydronephrosis visualized. Left Kidney: Length: 8.8 cm. Echogenicity within normal limits. There is renal cortical thinning. No mass or hydronephrosis visualized. Abdominal aorta: No aneurysm visualized. Other findings: No demonstrable ascites. IMPRESSION: Gallstones with gallbladder wall is slightly thickened. No pericholecystic fluid demonstrated. The appearance currently may be indicative of early cholecystitis. Portions of pancreas obscured by gas. Visualized portions of pancreas appear unremarkable. Small kidneys with renal cortical thinning. These findings may be indicative of a degree of underlying medical renal disease. No renal obstructing focus on either side. Electronically Signed  By: Lowella Grip  III M.D.   On: 03/01/2017 11:34   Ir Perc Cholecystostomy  Result Date: 03/02/2017 INDICATION: Acute cholecystitis.  Sepsis. EXAM: CHOLECYSTOSTOMY MEDICATIONS: None ANESTHESIA/SEDATION: Fentanyl 25 mcg IV; Versed 1 mg IV Moderate Sedation Time:  10 The patient was continuously monitored during the procedure by the interventional radiology nurse under my direct supervision. FLUOROSCOPY TIME:  Fluoroscopy Time:  minutes 36 seconds (3 mGy). COMPLICATIONS: None immediate. PROCEDURE: Informed written consent was obtained from the patient after a thorough discussion of the procedural risks, benefits and alternatives. All questions were addressed. Maximal Sterile Barrier Technique was utilized including caps, mask, sterile gowns, sterile gloves, sterile drape, hand hygiene and skin antiseptic. A timeout was performed prior to the initiation of the procedure. The right upper quadrant was prepped with Betadine in a sterile fashion, and a sterile drape was applied covering the operative field. A sterile gown and sterile gloves were used for the procedure. A 21 gauge needle was inserted into the gallbladder lumen under sonographic guidance and via transhepatic approach. It was removed over a 018 wire, which was upsized to a 3-J. A 10-French drain was advanced over the wire and coiled in the gallbladder lumen. It was sewn to the skin after being string fixed. Contrast was injected. FINDINGS: The cystic duct is occluded. Final imaging demonstrates placement of a 10 French drain coiled in the lumen of the gallbladder. IMPRESSION: Successful cholecystostomy. This needs to remain in place at least 6 weeks. Electronically Signed   By: Marybelle Killings M.D.   On: 03/02/2017 12:01    Anti-infectives: Anti-infectives    Start     Dose/Rate Route Frequency Ordered Stop   03/03/17 1200  vancomycin (VANCOCIN) IVPB 1000 mg/200 mL premix  Status:  Discontinued     1,000 mg 200 mL/hr over 60 Minutes Intravenous Every 48 hours 03/01/17  1127 03/02/17 1008   03/01/17 1130  fluconazole (DIFLUCAN) IVPB 200 mg     200 mg 100 mL/hr over 60 Minutes Intravenous Every 24 hours 03/01/17 1126     03/01/17 1130  vancomycin (VANCOCIN) 1,500 mg in sodium chloride 0.9 % 500 mL IVPB     1,500 mg 250 mL/hr over 120 Minutes Intravenous  Once 03/01/17 1126 03/01/17 1344   03/01/17 0600  piperacillin-tazobactam (ZOSYN) IVPB 3.375 g     3.375 g 12.5 mL/hr over 240 Minutes Intravenous Every 8 hours 03/01/17 0124     02/28/17 2300  piperacillin-tazobactam (ZOSYN) IVPB 3.375 g     3.375 g 100 mL/hr over 30 Minutes Intravenous  Once 02/28/17 2246 02/28/17 2332      Assessment/Plan: Cholecystitis - S/P perc drain, Zosyn, try fulls  LOS: 3 days    Ryley Teater E 03/03/2017

## 2017-03-03 NOTE — Progress Notes (Signed)
Referring Physician(s):  Dr. Judeth Horn  Supervising Physician: Marybelle Killings  Patient Status:  [facility] - In-pt  Chief Complaint:  Recurrent cholecystitis s/p percutaneous cholecystostomy placement 6/9  Subjective: Resting comfortably.  Able to tolerate some liquids this AM.   Allergies: Patient has no known allergies.  Medications: Prior to Admission medications   Medication Sig Start Date End Date Taking? Authorizing Provider  BD PEN NEEDLE NANO U/F 32G X 4 MM MISC Use to inject insulin 4  times daily as directed 06/13/16  Yes Elayne Snare, MD  Cholecalciferol (VITAMIN D3) 5000 units CAPS Take 1 capsule by mouth daily.   Yes [provider]  clopidogrel (PLAVIX) 75 MG tablet Take 75 mg by mouth daily. 01/31/17  Yes [provider]  furosemide (LASIX) 40 MG tablet TAKE ONE-HALF TABLET BY  MOUTH TWO TIMES DAILY Patient taking differently: 80 mg TABLET  DAILY 02/11/17  Yes Jettie Booze, MD  HUMALOG KWIKPEN 100 UNIT/ML KiwkPen INJECT 0.08 MLS (8 UNITS)  INTO THE SKIN 3 TIMES  DAILY. 01/16/17  Yes Elayne Snare, MD  Insulin Glargine (LANTUS SOLOSTAR) 100 UNIT/ML Solostar Pen Inject subcutaneously 14  units daily Patient taking differently: Inject subcutaneously 15  units daily 05/16/16  Yes Elayne Snare, MD  Multiple Vitamin (MULTI VITAMIN MENS) tablet Take 1 tablet by mouth daily.   Yes [provider]  multivitamin-lutein (OCUVITE-LUTEIN) CAPS capsule Take 1 capsule by mouth daily.   Yes [provider]  ONE TOUCH ULTRA TEST test strip TEST 3 TIMES A DAY DIAG E11.29 12/17/16  Yes Elayne Snare, MD  pravastatin (PRAVACHOL) 80 MG tablet Take 1 tablet by mouth  daily 05/01/16  Yes Jettie Booze, MD  ranitidine (ZANTAC) 150 MG tablet Take 150 mg by mouth 2 (two) times daily.   Yes [provider]  ALPRAZolam Duanne Moron) 0.25 MG tablet  09/10/16   [provider]     Vital Signs: BP (!) 107/57   Pulse 75   Temp 97.6 F (36.4 C)  (Oral)   Resp 19   Ht 6' (1.829 m)   Wt 192 lb 0.3 oz (87.1 kg)   SpO2 100%   BMI 26.04 kg/m   Physical Exam  NAD, alert, awakens easily Abdomen:  Soft, mildly tender, drain in place with blood-tinged, bilious output 40 mL recorded yesterday.  Dressing in place.   Imaging: Ct Abdomen Pelvis Wo Contrast  Result Date: 02/28/2017 CLINICAL DATA:  Right upper quadrant and epigastric abdominal pain. Nausea, vomiting and diarrhea. History of cholecystitis treated with percutaneous cholecystostomy. EXAM: CT ABDOMEN AND PELVIS WITHOUT CONTRAST TECHNIQUE: Multidetector CT imaging of the abdomen and pelvis was performed following the standard protocol without IV contrast. COMPARISON:  CT 05/09/2015 FINDINGS: Lower chest: No consolidation or pleural fluid. Coronary artery calcifications are seen. Hepatobiliary: Multiple intraluminal gallstones. Suggestion of mild gallbladder wall thickening and pericholecystic soft tissue stranding. No biliary dilatation. No focal hepatic lesion allowing for lack contrast. Small peripheral metallic density about the right lobe of the liver, unchanged from prior exam. Scattered hepatic granulomas. Pancreas: No ductal dilatation or inflammation. Parenchymal atrophy. Spleen: Normal in size without focal abnormality. Adrenals/Urinary Tract: Mild left adrenal thickening without discrete nodule. Thinning of bilateral renal parenchyma. No hydronephrosis or perinephric edema. Ureters are decompressed. Urinary bladder is physiologically distended, no wall thickening. Stomach/Bowel: Mild sigmoid colonic diverticulosis without diverticulitis. Normal appendix. No small bowel dilatation or inflammation. There is a 12 mm soft tissue nodular density in the second portion of the  duodenum. Vascular/Lymphatic: Aortic and branch atherosclerosis. No aneurysm. No adenopathy. Reproductive: Enlarged prostate gland spanning 6.3 cm. Brachytherapy seeds. Other: No ascites or free air. Tacks in the lower  anterior abdominal wall post hernia repair. No evidence of recurrent hernia. Musculoskeletal: There are no acute or suspicious osseous abnormalities. Degenerative change at L4-L5 and L5-S1. IMPRESSION: 1. Cholelithiasis with probable gallbladder wall thickening and mild pericholecystic inflammation, suspicious for acute cholecystitis. No biliary dilatation. 2. Nodular 12 mm soft tissue density in the second portion of the duodenum, while this may reflect focal fold thickening, endoscopic evaluation may be helpful to exclude focal mass. 3. Aortic atherosclerosis.  Coronary artery calcifications. Electronically Signed   By: Jeb Levering M.D.   On: 02/28/2017 21:41   Dg Chest 2 View  Result Date: 02/28/2017 CLINICAL DATA:  Chest pain for 2 days EXAM: CHEST  2 VIEW COMPARISON:  05/11/2015 FINDINGS: Cardiac shadows within normal limits. The lungs are well aerated bilaterally. Patchy chronic changes are noted in the right upper lobe. Chronic blunting of the right costophrenic angle is noted. No focal infiltrate or sizable effusion is seen. No acute bony abnormality is noted. IMPRESSION: No acute abnormality seen. Electronically Signed   By: Inez Catalina M.D.   On: 02/28/2017 16:03   Nm Hepatobiliary Liver Func  Result Date: 03/01/2017 CLINICAL DATA:  83 year old male with abdominal pain and nausea vomiting. EXAM: NUCLEAR MEDICINE HEPATOBILIARY IMAGING TECHNIQUE: Sequential images of the abdomen were obtained out to 60 minutes following intravenous administration of radiopharmaceutical. 3 mg of morphine IV was administered after the initial first hour. RADIOPHARMACEUTICALS:  5.43 mCi Tc-86m  Choletec IV COMPARISON:  None. FINDINGS: Prompt homogeneous hepatic uptake is identified. Small bowel activity is identified at 15 minutes. Gallbladder activity is not identified even after the administration of morphine. IMPRESSION: Nonvisualization of the gallbladder compatible with cystic duct obstruction/ acute  cholecystitis. Electronically Signed   By: Margarette Canada M.D.   On: 03/01/2017 19:51   US Abdomen Complete  Result Date: 03/01/2017 CLINICAL DATA:  Abdominal pain and hypotension EXAM: ABDOMEN ULTRASOUND COMPLETE COMPARISON:  CT abdomen and pelvis February 28, 2017 FINDINGS: Gallbladder: Within the gallbladder, there are multiple echogenic foci which move and shadow consistent with cholelithiasis. Largest gallstone measures 2.0 cm in length. The gallbladder wall appears slightly thickened. There is no demonstrable pericholecystic fluid. Note that there is sludge in the gallbladder as well. No sonographic Murphy sign noted by sonographer. Common bile duct: Diameter: 4 mm. No intrahepatic, common hepatic, or common bile duct dilatation. Liver: No focal lesion identified. Within normal limits in parenchymal echogenicity. IVC: No abnormality visualized. Pancreas: Visualized portion unremarkable. Much of the pancreas is obscured by gas. Spleen: Size and appearance within normal limits. Right Kidney: Length: 9.5 cm. Echogenicity within normal limits. There is renal cortical thinning. No mass or hydronephrosis visualized. Left Kidney: Length: 8.8 cm. Echogenicity within normal limits. There is renal cortical thinning. No mass or hydronephrosis visualized. Abdominal aorta: No aneurysm visualized. Other findings: No demonstrable ascites. IMPRESSION: Gallstones with gallbladder wall is slightly thickened. No pericholecystic fluid demonstrated. The appearance currently may be indicative of early cholecystitis. Portions of pancreas obscured by gas. Visualized portions of pancreas appear unremarkable. Small kidneys with renal cortical thinning. These findings may be indicative of a degree of underlying medical renal disease. No renal obstructing focus on either side. Electronically Signed   By: Lowella Grip III M.D.   On: 03/01/2017 11:34   Ir Perc Cholecystostomy  Result Date: 03/02/2017 INDICATION: Acute cholecystitis.  Sepsis. EXAM: CHOLECYSTOSTOMY MEDICATIONS: None ANESTHESIA/SEDATION: Fentanyl 25 mcg IV; Versed 1 mg IV Moderate Sedation Time:  10 The patient was continuously monitored during the procedure by the interventional radiology nurse under my direct supervision. FLUOROSCOPY TIME:  Fluoroscopy Time:  minutes 36 seconds (3 mGy). COMPLICATIONS: None immediate. PROCEDURE: Informed written consent was obtained from the patient after a thorough discussion of the procedural risks, benefits and alternatives. All questions were addressed. Maximal Sterile Barrier Technique was utilized including caps, mask, sterile gowns, sterile gloves, sterile drape, hand hygiene and skin antiseptic. A timeout was performed prior to the initiation of the procedure. The right upper quadrant was prepped with Betadine in a sterile fashion, and a sterile drape was applied covering the operative field. A sterile gown and sterile gloves were used for the procedure. A 21 gauge needle was inserted into the gallbladder lumen under sonographic guidance and via transhepatic approach. It was removed over a 018 wire, which was upsized to a 3-J. A 10-French drain was advanced over the wire and coiled in the gallbladder lumen. It was sewn to the skin after being string fixed. Contrast was injected. FINDINGS: The cystic duct is occluded. Final imaging demonstrates placement of a 10 French drain coiled in the lumen of the gallbladder. IMPRESSION: Successful cholecystostomy. This needs to remain in place at least 6 weeks. Electronically Signed   By: Marybelle Killings M.D.   On: 03/02/2017 12:01    Labs:  CBC:  Recent Labs  02/28/17 1453 03/01/17 0234 03/02/17 0543 03/03/17 0304  WBC 16.8* 17.2* 25.8* 17.6*  HGB 11.4* 10.5* 8.5* 8.4*  HCT 34.1* 32.6* 26.8* 26.8*  PLT 383 286 225 218    COAGS:  Recent Labs  03/02/17 0543  INR 1.32    BMP:  Recent Labs  02/28/17 1453 03/01/17 0234 03/02/17 0543 03/03/17 0304  NA 138 139 143 142  K 4.0  4.3 3.9 4.0  CL 101 99* 111 114*  CO2 23 24 22  21*  GLUCOSE 272* 300* 144* 112*  BUN 37* 40* 35* 29*  CALCIUM 10.2 9.3 7.9* 7.9*  CREATININE 2.68* 2.78* 2.58* 2.54*  GFRNONAA 20* 19* 21* 21*  GFRAA 23* 22* 24* 25*    LIVER FUNCTION TESTS:  Recent Labs  12/13/16 0921 02/28/17 2344 03/01/17 0234 03/02/17 0543  BILITOT 1.0 2.4* 2.9* 1.5*  AST 20 21 32 63*  ALT 13 13* 13* 55  ALKPHOS 40 50 53 49  PROT 6.5 7.3 6.7 5.2*  ALBUMIN 4.2 4.3 4.0 2.8*    Assessment and Plan: Recurrent cholecystitis s/p drain placement 03/02/17 by Dr. Barbie Banner Patient resting comfortably.  Abdominal pain improved.  Afebrile today. WBC improved to 17.6, but remains on pressors for sepsis and hypotension Culture pending.  Continue routine drain care. IR to follow.   Electronically Signed: Docia Barrier, PA 03/03/2017, 11:49 AM   I spent a total of 15 Minutes at the the patient's bedside AND on the patient's hospital floor or unit, greater than 50% of which was counseling/coordinating care for cholecystitis

## 2017-03-04 LAB — GLUCOSE, CAPILLARY
GLUCOSE-CAPILLARY: 127 mg/dL — AB (ref 65–99)
GLUCOSE-CAPILLARY: 147 mg/dL — AB (ref 65–99)
GLUCOSE-CAPILLARY: 154 mg/dL — AB (ref 65–99)
GLUCOSE-CAPILLARY: 84 mg/dL (ref 65–99)
Glucose-Capillary: 125 mg/dL — ABNORMAL HIGH (ref 65–99)
Glucose-Capillary: 122 mg/dL — ABNORMAL HIGH (ref 65–99)

## 2017-03-04 LAB — BASIC METABOLIC PANEL
ANION GAP: 5 (ref 5–15)
BUN: 22 mg/dL — AB (ref 6–20)
CALCIUM: 7.8 mg/dL — AB (ref 8.9–10.3)
CO2: 21 mmol/L — ABNORMAL LOW (ref 22–32)
Chloride: 115 mmol/L — ABNORMAL HIGH (ref 101–111)
Creatinine, Ser: 2.32 mg/dL — ABNORMAL HIGH (ref 0.61–1.24)
GFR calc Af Amer: 28 mL/min — ABNORMAL LOW (ref >60)
GFR, EST NON AFRICAN AMERICAN: 24 mL/min — AB (ref >60)
GLUCOSE: 154 mg/dL — AB (ref 65–99)
Potassium: 4.1 mmol/L (ref 3.5–5.1)
SODIUM: 141 mmol/L (ref 135–145)

## 2017-03-04 LAB — CBC
HCT: 24.8 % — ABNORMAL LOW (ref 39.0–52.0)
Hemoglobin: 7.8 g/dL — ABNORMAL LOW (ref 13.0–17.0)
MCH: 34.5 pg — AB (ref 26.0–34.0)
MCHC: 31.5 g/dL (ref 30.0–36.0)
MCV: 109.7 fL — ABNORMAL HIGH (ref 78.0–100.0)
PLATELETS: 198 10*3/uL (ref 150–400)
RBC: 2.26 MIL/uL — AB (ref 4.22–5.81)
RDW: 17.6 % — ABNORMAL HIGH (ref 11.5–15.5)
WBC: 8.1 10*3/uL (ref 4.0–10.5)

## 2017-03-04 LAB — MAGNESIUM: MAGNESIUM: 2.3 mg/dL (ref 1.7–2.4)

## 2017-03-04 MED ORDER — INSULIN ASPART 100 UNIT/ML ~~LOC~~ SOLN
0.0000 [IU] | Freq: Three times a day (TID) | SUBCUTANEOUS | Status: DC
  Administered 2017-03-04 – 2017-03-05 (×2): 2 [IU] via SUBCUTANEOUS
  Administered 2017-03-05: 3 [IU] via SUBCUTANEOUS

## 2017-03-04 MED ORDER — CLOPIDOGREL BISULFATE 75 MG PO TABS
75.0000 mg | ORAL_TABLET | Freq: Every day | ORAL | Status: DC
  Administered 2017-03-05: 75 mg via ORAL
  Filled 2017-03-04: qty 1

## 2017-03-04 MED ORDER — INSULIN ASPART 100 UNIT/ML ~~LOC~~ SOLN: 0.0000 [IU] | Freq: Three times a day (TID) | SUBCUTANEOUS | Status: DC

## 2017-03-04 MED ORDER — INSULIN ASPART 100 UNIT/ML ~~LOC~~ SOLN: 0.0000 [IU] | Freq: Every day | SUBCUTANEOUS | Status: DC

## 2017-03-04 MED ORDER — SODIUM CHLORIDE 0.9 % IV SOLN: INTRAVENOUS | Status: DC

## 2017-03-04 MED ORDER — MORPHINE SULFATE (PF) 4 MG/ML IV SOLN: 1.0000 mg | INTRAVENOUS | Status: DC | PRN

## 2017-03-04 MED ORDER — INSULIN GLARGINE 100 UNIT/ML ~~LOC~~ SOLN
7.0000 [IU] | Freq: Every day | SUBCUTANEOUS | Status: DC
  Filled 2017-03-04 (×2): qty 0.07
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
## 2017-03-04 NOTE — Progress Notes (Signed)
PULMONARY / CRITICAL CARE MEDICINE   Name: Robert [name] MRN: [id number] DOB: November 07, 1927    ADMISSION DATE:  02/28/2017 CONSULTATION DATE:  6/8  REFERRING MD:  [facility]  CHIEF COMPLAINT:  Abd pain N/V/D from food.  HISTORY OF PRESENT ILLNESS:   [name] is a 83 yo gentleman who lives independently and eats all 3 meals out daily([employer] gives him diarrhea) who was in his normal state of health till he ate a barbeque sandwich 6/7 which caused N/V/D along with AMS. He was on lasix 80 mg daily and continued to take lasix(CHF/ef 40%) during N/V/D. He was taken to ED for evaluation and found to have hypotension and was treated with fluids. Note : He had a similar episode 2 years ago that required a perq chole drain as he was deemed not a surgical candidate.His CT abd this admit again reveals cholecystis. He has had 2.5 litres of IVF but has proven refractory to treatment and remains hypotensive. He has been moved to ICU and PCCM will assume his care till stable. Further note he had prostate cancer 10 years ago and did received 40 rtx treatments.   SUBJECTIVE:  Clinically improved Phenylephrine has been weaned off Still with some soreness, abdominal pain at drain site Feels that he is ready to eat solid food   VITAL SIGNS: BP (!) 145/61 (BP Location: Left Arm)   Pulse 89   Temp 98 F (36.7 C) (Oral)   Resp 20   Ht 6' (1.829 m)   Wt 90.3 kg (199 lb 1.2 oz)   SpO2 95%   BMI 27.00 kg/m   HEMODYNAMICS:    VENTILATOR SETTINGS:    INTAKE / OUTPUT: I/O last 3 completed shifts: In: 3645.4 [I.V.:3495.4; IV Piggyback:150] Out: 540 [Urine:300; Drains:240]  PHYSICAL EXAMINATION: General:  Elderly gentleman, in no distress Neuro:  Awake, alert, interacts appropriately, moves all extremities HEENT: Oropharynx moist, no lesions, no JVD Cardiovascular: Regular, no murmur Lungs: Clear bilaterally Abdomen: Drain in place with blood-tinged fluid, tender to palpation on the right side,  positive bowel sounds Musculoskeletal:  No deformity Skin: Warm, no rash  LABS:  BMET  Recent Labs Lab 03/02/17 0543 03/03/17 0304 03/04/17 0151  NA 143 142 141  K 3.9 4.0 4.1  CL 111 114* 115*  CO2 22 21* 21*  BUN 35* 29* 22*  CREATININE 2.58* 2.54* 2.32*  GLUCOSE 144* 112* 154*    Electrolytes  Recent Labs Lab 03/02/17 0543 03/03/17 0304 03/04/17 0151  CALCIUM 7.9* 7.9* 7.8*  MG  --  2.5* 2.3    CBC  Recent Labs Lab 03/02/17 0543 03/03/17 0304 03/04/17 0151  WBC 25.8* 17.6* 8.1  HGB 8.5* 8.4* 7.8*  HCT 26.8* 26.8* 24.8*  PLT 225 218 198    Coag's  Recent Labs Lab 03/02/17 0543  INR 1.32    Sepsis Markers  Recent Labs Lab 03/01/17 0719 03/01/17 1011 03/02/17 0543  LATICACIDVEN 3.6* 2.3* 1.4    ABG No results for input(s): PHART, PCO2ART, PO2ART in the last 168 hours.  Liver Enzymes  Recent Labs Lab 02/28/17 2344 03/01/17 0234 03/02/17 0543  AST 21 32 63*  ALT 13* 13* 55  ALKPHOS 50 53 49  BILITOT 2.4* 2.9* 1.5*  ALBUMIN 4.3 4.0 2.8*    Cardiac Enzymes  Recent Labs Lab 03/01/17 1920 03/01/17 2308 03/02/17 0543  TROPONINI 2.47* 2.21* 1.69*    Glucose  Recent Labs Lab 03/03/17 1628 03/03/17 2027 03/04/17 0027 03/04/17 0402 03/04/17 0711 03/04/17 1055  GLUCAP  161* 172* 154* 125* 84 147*    Imaging No results found.   STUDIES:  6/7 ct abd as noted 6/8 abd US>> gallstones with some slight thickening of the gallbladder wall without any periCholecystic fluid Hida scan 6/8 >> no contrast uptake in the gallbladder consistent with cystic duct obstruction and acute cholecystitis Echocardiogram 6/9 >> inferior akinesis, LV ejection fraction 35-40%, moderate to severe MR, estimated PASP 39 mmHg  CULTURES: 6/8 bc>> Gallbladder fluid 6/9 >> GNR >>   ANTIBIOTICS:  6/8 pip-tazo>> 6/8 diflucan>> 6/10 Vancomycin 6/8 >> 6/9  SIGNIFICANT EVENTS: 6/7 admit to [facility]  LINES/TUBES: Perc Gallbladder drain 6/9 >>    DISCUSSION: [name] is an 83 yo gentleman who lives independently and eats all 3 meals out daily([employer] gives him diarrhea) who was in his normal state of health till he ate a barbeque sandwich 6/7 which caused N/V/D along with AMS. He was on lasix 80 mg daily and continued to take lasix(CHF/ef 40%) during N/V/D. He was taken to ED for evaluation and found to have hypotension and was treated with fluids. Note : He had a similar episode 2 years ago that required a perq chole drain as he was deemed not a surgical candidate.His CT abd this admit again reveals cholecystis. He has had 2.5 litres of IVF but has proven refractory to treatment and remains hypotensive. He has been moved to ICU and PCCM will assume his care till stable. Further note he had prostate cancer 10 years ago and did received 40 rtx treatments.   ASSESSMENT / PLAN:  PULMONARY A: No acute issue P:   Continue pulmonary hygiene  CARDIOVASCULAR A:  Septic shock, remains on phenylephrine Chronic lasix use for CHF with continued use coupled with N/V/D  CHF ef 40% CAD PVD on plavix +trop, apparent stress non-STEMI  P:  Phenylephrine titrated to off Continue gallbladder drainage Antibiotics as below Home Lasix is on hold  RENAL A:   Acute on CKD base creatine 2.2, improving P:   Continue adequate renal perfusion, currently back to baseline Follow BMP and urine output   GASTROINTESTINAL A:   Recurrent cholecystitis  Probable gastroenteritis GI protection P:   Advance diet to carb modified today Pepcid Gallbladder drain in place, appreciate CCS recs regarding duration, timing of removal Question whether he may require cholecystectomy at some point in the future. He is high risk for surgery and this has been avoided in the past.   HEMATOLOGIC A:   On plavix for PVD DVT protection  P:  Plan to restart Plavix on 6/12 Follow CBC SCD in place  INFECTIOUS A:   Sepsis from acute cholecystitis CT abd  with cholelithiasis, apparent common bile duct obstruction Cholecystitis hx that required perq drain with deferred surgery, surgery and IR following P:   Gallbladder drain in place Continue Zosyn Stopped fluconazole on 6/10   ENDOCRINE A:   IDDM  P:   SSI as ordered He is on Lantus at home, likely restart on 6/12 if he is taking a good diet  NEUROLOGIC A:   Pain  No Hx of neurological compromise  P:   RASS goal: 0 Low-dose morphine for abdominal pain  FAMILY  - Updates: Patient and son updated 6/9 and 6/10  - Inter-disciplinary family meet or Palliative Care meeting due by:  day 7  Transfer to floor bed on 6/11, to [facility] as of 6/12.   Baltazar Apo, MD, PhD 03/04/2017, 4:56 PM [facility] [phone] or if  no answer [phone]

## 2017-03-04 NOTE — Care Management Note (Signed)
Case Management Note  Patient Details  Name: Robert [name] MRN: [id number] Date of Birth: 1927/12/25  Subjective/Objective:    Pt admitted  for chest pain, elevated troponin's, abdominal pain, N/V/D yesterday.  In the ER a CT AP was done and showed cholecystitis and also sepsis. His troponins were elevated originally at 0.05 and then second resulted as 2.24.               Action/Plan:  Pt was independent from home PTA.     Expected Discharge Date:                  Expected Discharge Plan:     In-House Referral:     Discharge planning Services  CM Consult  Post Acute Care Choice:    Choice offered to:     DME Arranged:    DME Agency:     HH Arranged:    HH Agency:     Status of Service:     If discussed at [employer], dates discussed:    Additional Comments: Pt is now 3 days s/p per drain placement -draining bilious  [name], RN 03/04/2017, 10:40 AM

## 2017-03-04 NOTE — Progress Notes (Signed)
Progress Note  Patient Name: Robert [name] Date of Encounter: 03/04/2017  Primary Cardiologist: [name]  Subjective   83 yo with hx of CAD ( DES to LCx) , prostate cancer admitted with nausea and abdomin pain and sepsis.  Troponins were mildly elevated - peak of 2.47.   Thought to be due to demand ischemia Has had a cholecystocopy with percutaneous drain placement  Inpatient Medications    Scheduled Meds: . chlorhexidine  15 mL Mouth Rinse BID  . famotidine  20 mg Oral QHS  . insulin aspart  0-15 Units Subcutaneous Q4H  . insulin glargine  7 Units Subcutaneous QHS  . mouth rinse  15 mL Mouth Rinse q12n4p  . pravastatin  80 mg Oral Daily   Continuous Infusions: . sodium chloride 75 mL/hr at 03/04/17 0400  . phenylephrine (NEO-SYNEPHRINE) Adult infusion Stopped (03/03/17 1800)  . piperacillin-tazobactam (ZOSYN)  IV 3.375 g (03/04/17 0533)   PRN Meds: acetaminophen **OR** acetaminophen, ALPRAZolam, morphine injection, ondansetron **OR** ondansetron (ZOFRAN) IV   Vital Signs    Vitals:   03/04/17 0028 03/04/17 0403 03/04/17 0500 03/04/17 0714  BP:      Pulse:      Resp:      Temp: 98.5 F (36.9 C) 97.7 F (36.5 C)  97.5 F (36.4 C)  TempSrc: Oral Oral  Oral  SpO2:      Weight:   199 lb 1.2 oz (90.3 kg)   Height:        Intake/Output Summary (Last 24 hours) at 03/04/17 0830 Last data filed at 03/04/17 0600  Gross per 24 hour  Intake           1877.5 ml  Output              400 ml  Net           1477.5 ml   Filed Weights   03/02/17 0500 03/03/17 0500 03/04/17 0500  Weight: 187 lb 13.3 oz (85.2 kg) 192 lb 0.3 oz (87.1 kg) 199 lb 1.2 oz (90.3 kg)    Telemetry    NSR  - Personally Reviewed  ECG    NSR  - Personally Reviewed  Physical Exam   GEN: elderly man, NAD  Neck: No JVD Cardiac: RRR, + systolic murmur , no , rubs, or gallops.  Respiratory: Clear to auscultation bilaterally. GI: Soft, nontender, non-distended  MS: No edema; No  deformity. Neuro:  Nonfocal  Psych: Normal affect   Labs    Chemistry Recent Labs Lab 02/28/17 2344 03/01/17 0234 03/02/17 0543 03/03/17 0304 03/04/17 0151  NA  --  139 143 142 141  K  --  4.3 3.9 4.0 4.1  CL  --  99* 111 114* 115*  CO2  --  24 22 21* 21*  GLUCOSE  --  300* 144* 112* 154*  BUN  --  40* 35* 29* 22*  CREATININE  --  2.78* 2.58* 2.54* 2.32*  CALCIUM  --  9.3 7.9* 7.9* 7.8*  PROT 7.3 6.7 5.2*  --   --   ALBUMIN 4.3 4.0 2.8*  --   --   AST 21 32 63*  --   --   ALT 13* 13* 55  --   --   ALKPHOS 50 53 49  --   --   BILITOT 2.4* 2.9* 1.5*  --   --   GFRNONAA  --  19* 21* 21* 24*  GFRAA  --  22* 24* 25* 28*  ANIONGAP  --  16* 10 7 5      Hematology Recent Labs Lab 03/02/17 0543 03/03/17 0304 03/04/17 0151  WBC 25.8* 17.6* 8.1  RBC 2.46* 2.39* 2.26*  HGB 8.5* 8.4* 7.8*  HCT 26.8* 26.8* 24.8*  MCV 108.9* 112.1* 109.7*  MCH 34.6* 35.1* 34.5*  MCHC 31.7 31.3 31.5  RDW 17.7* 18.3* 17.6*  PLT 225 218 198    Cardiac Enzymes Recent Labs Lab 03/01/17 1011 03/01/17 1920 03/01/17 2308 03/02/17 0543  TROPONINI 2.24* 2.47* 2.21* 1.69*    Recent Labs Lab 02/28/17 1508 02/28/17 1921  TROPIPOC 0.01 0.01     BNPNo results for input(s): BNP, PROBNP in the last 168 hours.   DDimer No results for input(s): DDIMER in the last 168 hours.   Radiology    Ir Perc Cholecystostomy  Result Date: 03/02/2017 INDICATION: Acute cholecystitis.  Sepsis. EXAM: CHOLECYSTOSTOMY MEDICATIONS: None ANESTHESIA/SEDATION: Fentanyl 25 mcg IV; Versed 1 mg IV Moderate Sedation Time:  10 The patient was continuously monitored during the procedure by the interventional radiology nurse under my direct supervision. FLUOROSCOPY TIME:  Fluoroscopy Time:  minutes 36 seconds (3 mGy). COMPLICATIONS: None immediate. PROCEDURE: Informed written consent was obtained from the patient after a thorough discussion of the procedural risks, benefits and alternatives. All questions were addressed.  Maximal Sterile Barrier Technique was utilized including caps, mask, sterile gowns, sterile gloves, sterile drape, hand hygiene and skin antiseptic. A timeout was performed prior to the initiation of the procedure. The right upper quadrant was prepped with Betadine in a sterile fashion, and a sterile drape was applied covering the operative field. A sterile gown and sterile gloves were used for the procedure. A 21 gauge needle was inserted into the gallbladder lumen under sonographic guidance and via transhepatic approach. It was removed over a 018 wire, which was upsized to a 3-J. A 10-French drain was advanced over the wire and coiled in the gallbladder lumen. It was sewn to the skin after being string fixed. Contrast was injected. FINDINGS: The cystic duct is occluded. Final imaging demonstrates placement of a 10 French drain coiled in the lumen of the gallbladder. IMPRESSION: Successful cholecystostomy. This needs to remain in place at least 6 weeks. Electronically Signed   By: Marybelle Killings M.D.   On: 03/02/2017 12:01    Cardiac Studies     Patient Profile     83 y.o. male  Assessment & Plan    1.  NSTEMI:   Due to demand ischemia secondary to sepsis. He denies any CP . Will defer any further work up for now.   He needs to get over this acute sepsis episode.   2.  Chronic systolic CHF:   No signs  / symptoms of CHF. We should DC the IVF as soon as he is taking PO adequately .   Will sign off Call for questions He will follow up with Dr. [name] in the office.   Signed, Mertie Moores, MD  03/04/2017, 8:30 AM

## 2017-03-04 NOTE — Progress Notes (Signed)
[facility] Progress Note     Subjective: CC: cholecystitis s/p perc drain Mild abdominal soreness, improving. Tolerating PO. Having loose BMs. Drain output bilious today.   Objective: Vital signs in last 24 hours: Temp:  [97.5 F (36.4 C)-98.5 F (36.9 C)] 97.5 F (36.4 C) (06/11 0714) Pulse Rate:  [49-84] 84 (06/10 2115) Resp:  [17-35] 33 (06/10 2115) BP: (81-129)/(39-103) 111/61 (06/10 2115) SpO2:  [95 %-100 %] 95 % (06/10 2115) Weight:  [90.3 kg (199 lb 1.2 oz)] 90.3 kg (199 lb 1.2 oz) (06/11 0500) Last BM Date: 03/03/17  Intake/Output from previous day: 06/10 0701 - 06/11 0700 In: 1990 [I.V.:1902.5; IV Piggyback:87.5] Out: 400 [Urine:200; Drains:200] Intake/Output this shift: No intake/output data recorded.  PE: Gen:  Alert, NAD, pleasant and cooperative Card:  Regular rate and rhythm Pulm:  Normal effort, clear to auscultation bilaterally Abd: Soft, appropriately tender, bowel sounds present in all 4 quadrants, RUQ drain site c/d/i with bilious output in drainage bag.  Skin: warm and dry, no rashes  Psych: A&Ox3   Lab Results:   Recent Labs  03/03/17 0304 03/04/17 0151  WBC 17.6* 8.1  HGB 8.4* 7.8*  HCT 26.8* 24.8*  PLT 218 198   BMET  Recent Labs  03/03/17 0304 03/04/17 0151  NA 142 141  K 4.0 4.1  CL 114* 115*  CO2 21* 21*  GLUCOSE 112* 154*  BUN 29* 22*  CREATININE 2.54* 2.32*  CALCIUM 7.9* 7.8*   PT/INR  Recent Labs  03/02/17 0543  LABPROT 16.5*  INR 1.32   CMP     Component Value Date/Time   NA 141 03/04/2017 0151   K 4.1 03/04/2017 0151   CL 115 (H) 03/04/2017 0151   CO2 21 (L) 03/04/2017 0151   GLUCOSE 154 (H) 03/04/2017 0151   BUN 22 (H) 03/04/2017 0151   CREATININE 2.32 (H) 03/04/2017 0151   CREATININE 2.48 (H) 07/27/2014 1032   CALCIUM 7.8 (L) 03/04/2017 0151   PROT 5.2 (L) 03/02/2017 0543   ALBUMIN 2.8 (L) 03/02/2017 0543   AST 63 (H) 03/02/2017 0543   ALT 55 03/02/2017 0543   ALKPHOS 49 03/02/2017  0543   BILITOT 1.5 (H) 03/02/2017 0543   GFRNONAA 24 (L) 03/04/2017 0151   GFRAA 28 (L) 03/04/2017 0151   Lipase     Component Value Date/Time   LIPASE 25 02/28/2017 1453       Studies/Results: Ir Perc Cholecystostomy  Result Date: 03/02/2017 INDICATION: Acute cholecystitis.  Sepsis. EXAM: CHOLECYSTOSTOMY MEDICATIONS: None ANESTHESIA/SEDATION: Fentanyl 25 mcg IV; Versed 1 mg IV Moderate Sedation Time:  10 The patient was continuously monitored during the procedure by the interventional radiology nurse under my direct supervision. FLUOROSCOPY TIME:  Fluoroscopy Time:  minutes 36 seconds (3 mGy). COMPLICATIONS: None immediate. PROCEDURE: Informed written consent was obtained from the patient after a thorough discussion of the procedural risks, benefits and alternatives. All questions were addressed. Maximal Sterile Barrier Technique was utilized including caps, mask, sterile gowns, sterile gloves, sterile drape, hand hygiene and skin antiseptic. A timeout was performed prior to the initiation of the procedure. The right upper quadrant was prepped with Betadine in a sterile fashion, and a sterile drape was applied covering the operative field. A sterile gown and sterile gloves were used for the procedure. A 21 gauge needle was inserted into the gallbladder lumen under sonographic guidance and via transhepatic approach. It was removed over a 018 wire, which was upsized to a 3-J. A 10-French drain was advanced over  the wire and coiled in the gallbladder lumen. It was sewn to the skin after being string fixed. Contrast was injected. FINDINGS: The cystic duct is occluded. Final imaging demonstrates placement of a 10 French drain coiled in the lumen of the gallbladder. IMPRESSION: Successful cholecystostomy. This needs to remain in place at least 6 weeks. Electronically Signed   By: Marybelle Killings M.D.   On: 03/02/2017 12:01    Anti-infectives: Anti-infectives    Start     Dose/Rate Route Frequency  Ordered Stop   03/03/17 1200  vancomycin (VANCOCIN) IVPB 1000 mg/200 mL premix  Status:  Discontinued     1,000 mg 200 mL/hr over 60 Minutes Intravenous Every 48 hours 03/01/17 1127 03/02/17 1008   03/01/17 1130  fluconazole (DIFLUCAN) IVPB 200 mg  Status:  Discontinued     200 mg 100 mL/hr over 60 Minutes Intravenous Every 24 hours 03/01/17 1126 03/03/17 0837   03/01/17 1130  vancomycin (VANCOCIN) 1,500 mg in sodium chloride 0.9 % 500 mL IVPB     1,500 mg 250 mL/hr over 120 Minutes Intravenous  Once 03/01/17 1126 03/01/17 1344   03/01/17 0600  piperacillin-tazobactam (ZOSYN) IVPB 3.375 g     3.375 g 12.5 mL/hr over 240 Minutes Intravenous Every 8 hours 03/01/17 0124     02/28/17 2300  piperacillin-tazobactam (ZOSYN) IVPB 3.375 g     3.375 g 100 mL/hr over 30 Minutes Intravenous  Once 02/28/17 2246 02/28/17 2332     Assessment/Plan Cholecystitis - S/P perc drain - Leukocytosis resolved, off of pressors, continue Zosyn - follow CX (GS w/ Gram positive cocci and gram variable rods) - advance to heart healthy diet as tolerated   VTE- SCD's; hgb 7.8 from 8.4  ID - Zosyn 6/7 >>     LOS: 4 days    Jill Alexanders , [facility] 03/04/2017, 9:45 AM Pager: [phone] Consults: [phone] Mon-Fri 7:00 am-4:30 pm Sat-Sun 7:00 am-11:30 am

## 2017-03-04 NOTE — Progress Notes (Signed)
Admitted to 6N12 . Patient is alert and oriented, not in any distress. VSS . Oriented to unit and staff. Will continue to monitor.

## 2017-03-04 NOTE — Progress Notes (Signed)
eLink Physician-Brief Progress Note Patient Name: Robert [name] DOB: [birth year] MRN: [id number]   Date of Service  03/04/2017  HPI/Events of Note  Hyperglycemia eating  eICU Interventions  Change SSI to AC/HS schedule     Intervention Category Major Interventions: Hyperglycemia - active titration of insulin therapy  Simonne Maffucci 03/04/2017, 3:55 PM

## 2017-03-05 ENCOUNTER — Other Ambulatory Visit: Payer: Self-pay | Admitting: General Surgery

## 2017-03-05 DIAGNOSIS — K819 Cholecystitis, unspecified: Secondary | ICD-10-CM

## 2017-03-05 DIAGNOSIS — I10 Essential (primary) hypertension: Secondary | ICD-10-CM

## 2017-03-05 LAB — FOLATE RBC
Folate, RBC: >1884 ng/mL (ref >498)
HEMATOCRIT: 32.9 % — AB (ref 37.5–51.0)

## 2017-03-05 LAB — BASIC METABOLIC PANEL
Anion gap: 7 (ref 5–15)
BUN: 18 mg/dL (ref 6–20)
CALCIUM: 8.2 mg/dL — AB (ref 8.9–10.3)
CO2: 19 mmol/L — AB (ref 22–32)
Chloride: 113 mmol/L — ABNORMAL HIGH (ref 101–111)
Creatinine, Ser: 2.14 mg/dL — ABNORMAL HIGH (ref 0.61–1.24)
GFR calc non Af Amer: 26 mL/min — ABNORMAL LOW (ref >60)
GFR, EST AFRICAN AMERICAN: 30 mL/min — AB (ref >60)
GLUCOSE: 129 mg/dL — AB (ref 65–99)
Potassium: 4.2 mmol/L (ref 3.5–5.1)
Sodium: 139 mmol/L (ref 135–145)

## 2017-03-05 LAB — CBC
HCT: 26.5 % — ABNORMAL LOW (ref 39.0–52.0)
Hemoglobin: 8.5 g/dL — ABNORMAL LOW (ref 13.0–17.0)
MCH: 35 pg — AB (ref 26.0–34.0)
MCHC: 32.1 g/dL (ref 30.0–36.0)
MCV: 109.1 fL — ABNORMAL HIGH (ref 78.0–100.0)
Platelets: 232 10*3/uL (ref 150–400)
RBC: 2.43 MIL/uL — AB (ref 4.22–5.81)
RDW: 17 % — ABNORMAL HIGH (ref 11.5–15.5)
WBC: 7.2 10*3/uL (ref 4.0–10.5)

## 2017-03-05 LAB — BODY FLUID CULTURE

## 2017-03-05 LAB — GLUCOSE, CAPILLARY
Glucose-Capillary: 123 mg/dL — ABNORMAL HIGH (ref 65–99)
Glucose-Capillary: 165 mg/dL — ABNORMAL HIGH (ref 65–99)

## 2017-03-05 MED ORDER — FUROSEMIDE 40 MG PO TABS: ORAL_TABLET | ORAL | 1 refills | Status: DC

## 2017-03-05 MED ORDER — AMOXICILLIN-POT CLAVULANATE 500-125 MG PO TABS: 1.0000 | ORAL_TABLET | Freq: Two times a day (BID) | ORAL | 0 refills | Status: AC

## 2017-03-05 MED ORDER — INSULIN LISPRO 100 UNIT/ML (KWIKPEN): 3.0000 [IU] | PEN_INJECTOR | Freq: Three times a day (TID) | SUBCUTANEOUS | 2 refills | Status: DC

## 2017-03-05 MED ORDER — INSULIN GLARGINE 100 UNIT/ML SOLOSTAR PEN: 10.0000 [IU] | PEN_INJECTOR | Freq: Every day | SUBCUTANEOUS | 2 refills | Status: DC

## 2017-03-05 NOTE — Progress Notes (Signed)
[facility] Progress Note     Subjective: CC: cholecystitis s/p perc drain Denies pain. Tolerating PO. Mobilizing. Having bowel function. Requesting for drain education prior to discharge - states that with previous perc drain he did not have to flush TID.   Objective: Vital signs in last 24 hours: Temp:  [97.9 F (36.6 C)-98.4 F (36.9 C)] 98 F (36.7 C) (06/12 0448) Pulse Rate:  [61-89] 61 (06/12 0448) Resp:  [18-33] 23 (06/12 0448) BP: (99-145)/(61-95) 144/66 (06/12 0448) SpO2:  [95 %-100 %] 98 % (06/12 0448) Last BM Date: 03/03/17  Intake/Output from previous day: 06/11 0701 - 06/12 0700 In: 905 [P.O.:590; I.V.:255; IV Piggyback:50] Out: 1175 [Urine:1135; Drains:40] Intake/Output this shift: Total I/O In: -  Out: 200 [Urine:200]  PE: Gen:  Alert, NAD, pleasant and cooperative Card:  Regular rate and rhythm Pulm:  Normal effort, clear to auscultation bilaterally Abd: Soft, appropriately tender, bowel sounds present in all 4 quadrants, RUQ drain site c/d/i with bilious output in drainage bag.  Skin: warm and dry, no rashes  Psych: A&Ox3   Lab Results:   Recent Labs  03/04/17 0151 03/05/17 0406  WBC 8.1 7.2  HGB 7.8* 8.5*  HCT 24.8* 26.5*  PLT 198 232   BMET  Recent Labs  03/04/17 0151 03/05/17 0406  NA 141 139  K 4.1 4.2  CL 115* 113*  CO2 21* 19*  GLUCOSE 154* 129*  BUN 22* 18  CREATININE 2.32* 2.14*  CALCIUM 7.8* 8.2*   PT/INR No results for input(s): LABPROT, INR in the last 72 hours. CMP     Component Value Date/Time   NA 139 03/05/2017 0406   K 4.2 03/05/2017 0406   CL 113 (H) 03/05/2017 0406   CO2 19 (L) 03/05/2017 0406   GLUCOSE 129 (H) 03/05/2017 0406   BUN 18 03/05/2017 0406   CREATININE 2.14 (H) 03/05/2017 0406   CREATININE 2.48 (H) 07/27/2014 1032   CALCIUM 8.2 (L) 03/05/2017 0406   PROT 5.2 (L) 03/02/2017 0543   ALBUMIN 2.8 (L) 03/02/2017 0543   AST 63 (H) 03/02/2017 0543   ALT 55 03/02/2017 0543   ALKPHOS 49  03/02/2017 0543   BILITOT 1.5 (H) 03/02/2017 0543   GFRNONAA 26 (L) 03/05/2017 0406   GFRAA 30 (L) 03/05/2017 0406   Lipase     Component Value Date/Time   LIPASE 25 02/28/2017 1453       Studies/Results: No results found.  Anti-infectives: Anti-infectives    Start     Dose/Rate Route Frequency Ordered Stop   03/03/17 1200  vancomycin (VANCOCIN) IVPB 1000 mg/200 mL premix  Status:  Discontinued     1,000 mg 200 mL/hr over 60 Minutes Intravenous Every 48 hours 03/01/17 1127 03/02/17 1008   03/01/17 1130  fluconazole (DIFLUCAN) IVPB 200 mg  Status:  Discontinued     200 mg 100 mL/hr over 60 Minutes Intravenous Every 24 hours 03/01/17 1126 03/03/17 0837   03/01/17 1130  vancomycin (VANCOCIN) 1,500 mg in sodium chloride 0.9 % 500 mL IVPB     1,500 mg 250 mL/hr over 120 Minutes Intravenous  Once 03/01/17 1126 03/01/17 1344   03/01/17 0600  piperacillin-tazobactam (ZOSYN) IVPB 3.375 g     3.375 g 12.5 mL/hr over 240 Minutes Intravenous Every 8 hours 03/01/17 0124     02/28/17 2300  piperacillin-tazobactam (ZOSYN) IVPB 3.375 g     3.375 g 100 mL/hr over 30 Minutes Intravenous  Once 02/28/17 2246 02/28/17 2332  Assessment/Plan Cholecystitis - S/P perc drain - Leukocytosis resolved, off of pressors - cholecystostomy tube draining bilious fluid  - follow CX (GS w/ Gram positive cocci and gram variable rods - none predominant)  VTE- SCD's; hgb 7.8 from 8.4  ID - Zosyn 6/7 >> (Day #5); leukocytosis resolved, afebrile, ok to D/C abx from surgical standpoint.  FEN: heart health/carb modified    Stable for discharge from surgical perspective. Follow up with Dr. Greer Pickerel in 4-6 weeks. Follow up with IR.   LOS: 5 days    [facility] 03/05/2017, 8:44 AM Pager: [phone] Consults: [phone] Mon-Fri 7:00 am-4:30 pm Sat-Sun 7:00 am-11:30 am

## 2017-03-05 NOTE — Progress Notes (Signed)
Patient ID: Robert [name], male   DOB: 04/28/1928, 83 y.o.   MRN: [id number]  Pt will hear from scheduler time and date for follow up injection  To be performed in OP IR [facility] - approx 5 weeks  Order in place

## 2017-03-05 NOTE — Consult Note (Signed)
           [facility] CM Primary Care Navigator  03/05/2017  Chun Sellen 03-Mar-1928 [id number]   Went to see patientat the bedsideto identify possible discharge needs but he was alreadydischarged per staff report.  Patient was discharged home today (refused home health per Inpatient CM).  Primary care provider's office called (Bonnie)to notify of patient's discharge and need for post hospital follow-up and transition of care. Reminded also of patient's health issues needing follow-up. Made aware to refer patient to [facility] care management ifdeemed necessaryfor services.   For questions, please contact:  Dannielle Huh, BSN, RN- [employer] Primary Care Navigator  Telephone: [phone] [place]

## 2017-03-05 NOTE — Evaluation (Signed)
Physical Therapy Evaluation Patient Details Name: Robert [name] MRN: [id number] DOB: 04-25-1928 Today's Date: 03/05/2017   History of Present Illness  Pt is an 83 y/o male admitted secondary to sepsis and cholecystitis. Pt is s/p cholecystostomy on 6/9. PMH includes back surgery, CHF, CAD, CKD, DM, HTN, AV block, and MI s/p stent placement.   Clinical Impression  Pt admitted secondary to problem above with deficits below. PTA, pt was independent with functional mobility. Upon evaluation, pt with weakness and decreased balance, requiring min guard to min A for ambulation. Required use of IV pole for steadying. Recommended use of RW at home to increase stability, however, pt resistant to recommendation. Will need follow up education. Recommending HHPT, however, pt may refuse as he reports his son who does not live with him is a PT and can help him. Pt will have necessary assist from other son at d/c. Will continue to follow acutely.     Follow Up Recommendations Home health PT;Supervision/Assistance - 24 hour    Equipment Recommendations  None recommended by PT (has all DME at home )    Recommendations for Other Services       Precautions / Restrictions Precautions Precautions: Fall Restrictions Weight Bearing Restrictions: No      Mobility  Bed Mobility Overal bed mobility: Modified Independent             General bed mobility comments: Required increased time, but no assist   Transfers Overall transfer level: Needs assistance Equipment used: None Transfers: Sit to/from Stand Sit to Stand: Supervision         General transfer comment: Supervision for safety   Ambulation/Gait Ambulation/Gait assistance: Min guard;Min assist Ambulation Distance (Feet): 200 Feet Assistive device: None (USed IV pole) Gait Pattern/deviations: Step-through pattern;Decreased stride length;Drifts right/left;Trunk flexed Gait velocity: Decreased Gait velocity interpretation: Below normal  speed for age/gender General Gait Details: Slow, unsteady gait. Attempted gait without AD, however, pt requesting use of IV pole for steadying. Required min A without use of IV pole. Educated about use of RW at home to increase stability. Pt reporting he didn't need it, however, encouraged to use.   Stairs            Wheelchair Mobility    Modified Rankin (Stroke Patients Only)       Balance Overall balance assessment: Needs assistance Sitting-balance support: No upper extremity supported;Feet supported Sitting balance-Leahy Scale: Good     Standing balance support: Single extremity supported;No upper extremity supported;During functional activity Standing balance-Leahy Scale: Fair                               Pertinent Vitals/Pain Pain Assessment: 0-10 Pain Score: 3  Pain Location: abdomen at drain site  Pain Descriptors / Indicators: Sore Pain Intervention(s): Limited activity within patient's tolerance;Monitored during session;Repositioned    Home Living Family/patient expects to be discharged to:: Private residence Living Arrangements: Children (one son is PT ) Available Help at Discharge: Family;Available 24 hours/day Type of Home: House Home Access: Level entry     Home Layout: One level Home Equipment: Clinical cytogeneticist - 4 wheels;Cane - single point      Prior Function Level of Independence: Independent         Comments: Was very active and went on bus trips.      Hand Dominance   Dominant Hand: Right    Extremity/Trunk Assessment   Upper Extremity Assessment Upper Extremity Assessment: Generalized  weakness    Lower Extremity Assessment Lower Extremity Assessment: Generalized weakness       Communication   Communication: HOH  Cognition Arousal/Alertness: Awake/alert Behavior During Therapy: WFL for tasks assessed/performed Overall Cognitive Status: Within Functional Limits for tasks assessed                                         General Comments General comments (skin integrity, edema, etc.): Pt educated about generalized walking program at home with supervision. Pt reporting one of his son's is a PT and will help him if he needs it.     Exercises     Assessment/Plan    PT Assessment Patient needs continued PT services  PT Problem List Decreased strength;Decreased range of motion;Decreased balance;Decreased mobility;Decreased knowledge of use of DME;Decreased safety awareness;Decreased knowledge of precautions       PT Treatment Interventions DME instruction;Gait training;Functional mobility training;Therapeutic activities;Therapeutic exercise;Balance training;Neuromuscular re-education;Patient/family education    PT Goals (Current goals can be found in the Care Plan section)  Acute Rehab PT Goals Patient Stated Goal: to go home and mow the yard PT Goal Formulation: With patient Time For Goal Achievement: 03/12/17 Potential to Achieve Goals: Good    Frequency Min 3X/week   Barriers to discharge        Co-evaluation               AM-PAC PT "6 Clicks" Daily Activity  Outcome Measure Difficulty turning over in bed (including adjusting bedclothes, sheets and blankets)?: A Little Difficulty moving from lying on back to sitting on the side of the bed? : A Little Difficulty sitting down on and standing up from a chair with arms (e.g., wheelchair, bedside commode, etc,.)?: A Little Help needed moving to and from a bed to chair (including a wheelchair)?: A Little Help needed walking in hospital room?: A Little Help needed climbing 3-5 steps with a railing? : A Little 6 Click Score: 18    End of Session Equipment Utilized During Treatment: Gait belt Activity Tolerance: Patient tolerated treatment well Patient left: in bed;with call bell/phone within reach;with nursing/sitter in room Nurse Communication: Mobility status PT Visit Diagnosis: Unsteadiness on feet (R26.81)     Time: 8110-3159 PT Time Calculation (min) (ACUTE ONLY): 25 min   Charges:   PT Evaluation $PT Eval Low Complexity: 1 Procedure PT Treatments $Gait Training: 8-22 mins   PT G Codes:        Nicky Pugh, PT, DPT  Acute Rehabilitation Services  Pager: [phone]   Army Melia 03/05/2017, 12:18 PM

## 2017-03-05 NOTE — Care Management Note (Signed)
Case Management Note  Patient Details  Name: Robert [name] MRN: [id number] Date of Birth: 16-Aug-1928  Subjective/Objective:                    Action/Plan:  Discussed home health with patient. Patient does not want home health at this time. Paged MD  Expected Discharge Date:  03/05/17               Expected Discharge Plan:  Home/Self Care  In-House Referral:     Discharge planning Services  CM Consult  Post Acute Care Choice:  Home Health Choice offered to:  Patient  DME Arranged:    DME Agency:     HH Arranged:  Patient Refused [place] Agency:     Status of Service:  Completed, signed off  If discussed at [employer], dates discussed:    Additional Comments:  Marilu Favre, RN 03/05/2017, 12:21 PM

## 2017-03-05 NOTE — Discharge Summary (Signed)
Physician Discharge Summary  Robert [name] AOZ:[id number] DOB: Jan 29, 1928 DOA: 02/28/2017  PCP: [name], MD  Admit date: 02/28/2017 Discharge date: 03/05/2017  Admitted From:home Disposition:home with services  Recommendations for Outpatient Follow-up:  1. Follow up with PCP in 1-2 weeks 2. Please obtain BMP/CBC in one week  Home Health:yes Equipment/Devices:abdomen drain Discharge Condition:stable CODE STATUS:full Diet recommendation:carb modified heart healthy diet.  Brief/Interim Summary: 83 year old male with history of diabetes on insulin, coronary artery disease, chronic systolic congestive heart failure, chronic kidney disease stage IV, peripheral vascular disease presented with vomiting and epigastric pain. Of note: Patient had similar episode 2 years ago required percutaneous Chole drain as he was deemed not a surgical candidate.  This admission the CT scan of abdomen and pelvis consistent with  cholecystitis. Patient was found to have septic shock requiring pressors in ICU. Patient was transferred to [facility] service today.  # Septic sock due to acute cholecystitis requiring phenylephrine drip in ICU: Clinically improved. Blood pressure acceptable. Treated with IV Zosyn and switch him to oral Augmentin for 5 more days to complete a total 10 days of antibiotics.  #Recurrent cholecystitis: Evaluated by surgery and interventional radiology. Percutaneous drain was placed. Discussed with the surgery team. Recommended to follow up outpatient and is stable to discharge today. Patient will follow-up with IR and surgery outpatient. Home care service for RN ordered. Patient is clinically improved and able to tolerate diet well.  #Peripheral vascular disease: Antibiotics and home medications.  #Acute on chronic kidney disease is stage IV: Serum creatinine level improved. Recommended to continue to follow up with nephrologist outpatient. Avoid nephrotoxins.  #Diabetes on chronic insulin:  Insulin dose adjusted because of borderline low blood sugar level. Recommended to monitor blood sugar level and follow-up with PCP. Patient verbalized understanding.  # NSTEMI; due to demand ischemia secondary due to sepsis as per cardiologist. No need for inpatient workup and recommended outpatient follow-up. Resume home dose of Lasix. Patient has no chest pain or shortness of breath.  Patient was seen and examined at bedside first time today. He was asking me to go home today. He reported doing well. Denied nausea vomiting abdominal pain. He said he has good home support. I discussed with the surgery team who recommended that patient is stable to discharge with outpatient follow-up. Patient was on pulmonary critical care service and transferred to hospitalist today. I discussed with the pulmonary critical care team and discussed with Dr. Ashok Cordia since patient is ready to be discharged.  Discharge Diagnoses:  Principal Problem:   Sepsis ([place]) Active Problems:   Type II diabetes mellitus with renal manifestations (HCC)   Essential hypertension, benign   CKD (chronic kidney disease), stage IV (HCC)   Macrocytic anemia   Acute cholecystitis   Coronary artery disease due to lipid rich plaque   Chronic systolic heart failure [facility])    Discharge Instructions  Discharge Instructions    Call MD for:  difficulty breathing, headache or visual disturbances    Complete by:  As directed    Call MD for:  extreme fatigue    Complete by:  As directed    Call MD for:  hives    Complete by:  As directed    Call MD for:  persistant dizziness or light-headedness    Complete by:  As directed    Call MD for:  persistant nausea and vomiting    Complete by:  As directed    Call MD for:  redness, tenderness, or signs of infection (  pain, swelling, redness, odor or green/yellow discharge around incision site)    Complete by:  As directed    Call MD for:  severe uncontrolled pain    Complete by:  As directed     Call MD for:  temperature >100.4    Complete by:  As directed    Diet - low sodium heart healthy    Complete by:  As directed    Diet Carb Modified    Complete by:  As directed    Discharge instructions    Complete by:  As directed    Please call IR or surgery for drain related issue.   Increase activity slowly    Complete by:  As directed      Allergies as of 03/05/2017   No Known Allergies     Medication List    TAKE these medications   ALPRAZolam 0.25 MG tablet Commonly known as:  XANAX   amoxicillin-clavulanate 500-125 MG tablet Commonly known as:  AUGMENTIN Take 1 tablet (500 mg total) by mouth 2 (two) times daily.   BD PEN NEEDLE NANO U/F 32G X 4 MM Misc Generic drug:  Insulin Pen Needle Use to inject insulin 4  times daily as directed   clopidogrel 75 MG tablet Commonly known as:  PLAVIX Take 75 mg by mouth daily.   furosemide 40 MG tablet Commonly known as:  LASIX 80 mg TABLET  DAILY   Insulin Glargine 100 UNIT/ML Solostar Pen Commonly known as:  LANTUS SOLOSTAR Inject 10 Units into the skin daily at 10 pm. Inject subcutaneously 15  units daily What changed:  how much to take  how to take this  when to take this  additional instructions   insulin lispro 100 UNIT/ML KiwkPen Commonly known as:  HUMALOG KWIKPEN Inject 0.03 mLs (3 Units total) into the skin 3 (three) times daily. What changed:  See the new instructions.   MULTI VITAMIN MENS tablet Take 1 tablet by mouth daily.   multivitamin-lutein Caps capsule Take 1 capsule by mouth daily.   ONE TOUCH ULTRA TEST test strip Generic drug:  glucose blood TEST 3 TIMES A DAY DIAG E11.29   pravastatin 80 MG tablet Commonly known as:  PRAVACHOL Take 1 tablet by mouth  daily   ranitidine 150 MG tablet Commonly known as:  ZANTAC Take 150 mg by mouth 2 (two) times daily.   Vitamin D3 5000 units Caps Take 1 capsule by mouth daily.      Follow-up Information    Greer Pickerel, MD. Schedule an  appointment as soon as possible for a visit.   Specialty:  General Surgery Why:  4 weeks for follow up regarding gallbladder infection. Contact information: [address] [place] [address] [phone]        [name], MD. Schedule an appointment as soon as possible for a visit in 1 week(s).   Specialty:  Family Medicine Contact information: [place] [address] [phone]        Aletta Edouard, MD. Schedule an appointment as soon as possible for a visit in 3 week(s).   Specialty:  Interventional Radiology Contact information: [address] [place] [address] [phone]        Jettie Booze, MD. Schedule an appointment as soon as possible for a visit in 4 week(s).   Specialties:  Cardiology, Radiology, Interventional Cardiology Contact information: [address] [place] [address] [phone]  No Known Allergies  Consultations: Cardiologist General surgery Interventional radiology Pulmonary critical care  Procedures/Studies: Percutaneous abdominal drain  Subjective: Seen and examined at bedside. Reported doing well. Denied nausea vomiting or abdominal pain. Denied chest pain or shortness of breath. Wanted to go home today.  Discharge Exam: Vitals:   03/04/17 2209 03/05/17 0448  BP: 130/65 (!) 144/66  Pulse:  61  Resp: 18 (!) 23  Temp: 98.4 F (36.9 C) 98 F (36.7 C)   Vitals:   03/04/17 1400 03/04/17 1625 03/04/17 2209 03/05/17 0448  BP: (!) 141/73 (!) 145/61 130/65 (!) 144/66  Pulse: 85 89  61  Resp: (!) 23 20 18  (!) 23  Temp:  98 F (36.7 C) 98.4 F (36.9 C) 98 F (36.7 C)  TempSrc:  Oral Oral Oral  SpO2: 100% 95% 97% 98%  Weight:      Height:        General: Pt is alert, awake, not in acute distress Cardiovascular: RRR, S1/S2 +, no rubs, no gallops Respiratory: CTA bilaterally, no wheezing, no rhonchi Abdominal: Soft, NT, ND, bowel  sounds +.Niel Hummer site looks clean with no sign of bleeding or discharge. Extremities: no edema, no cyanosis Neurology: Alert, awake, following commands.   The results of significant diagnostics from this hospitalization (including imaging, microbiology, ancillary and laboratory) are listed below for reference.     Microbiology: Recent Results (from the past 240 hour(s))  Culture, blood (single)     Status: None (Preliminary result)   Collection Time: 02/28/17 11:44 PM  Result Value Ref Range Status   Specimen Description BLOOD RIGHT FOREARM  Final   Special Requests IN PEDIATRIC BOTTLE Blood Culture adequate volume  Final   Culture NO GROWTH 3 DAYS  Final   Report Status PENDING  Incomplete  MRSA PCR Screening     Status: None   Collection Time: 03/01/17  6:31 AM  Result Value Ref Range Status   MRSA by PCR NEGATIVE NEGATIVE Final    Comment:        The GeneXpert MRSA Assay (FDA approved for NASAL specimens only), is one component of a comprehensive MRSA colonization surveillance program. It is not intended to diagnose MRSA infection nor to guide or monitor treatment for MRSA infections.   Body fluid culture     Status: Abnormal (Preliminary result)   Collection Time: 03/02/17 11:54 AM  Result Value Ref Range Status   Specimen Description FLUID GALL BLADDER  Final   Special Requests NONE  Final   Gram Stain   Final    ABUNDANT WBC PRESENT,BOTH PMN AND MONONUCLEAR MODERATE GRAM POSITIVE COCCI FEW GRAM VARIABLE ROD    Culture MULTIPLE ORGANISMS PRESENT, NONE PREDOMINANT (A)  Final   Report Status PENDING  Incomplete     Labs: BNP (last 3 results) No results for input(s): BNP in the last 8760 hours. Basic Metabolic Panel:  Recent Labs Lab 03/01/17 0234 03/02/17 0543 03/03/17 0304 03/04/17 0151 03/05/17 0406  NA 139 143 142 141 139  K 4.3 3.9 4.0 4.1 4.2  CL 99* 111 114* 115* 113*  CO2 24 22 21* 21* 19*  GLUCOSE 300* 144* 112* 154* 129*  BUN 40* 35* 29* 22*  18  CREATININE 2.78* 2.58* 2.54* 2.32* 2.14*  CALCIUM 9.3 7.9* 7.9* 7.8* 8.2*  MG  --   --  2.5* 2.3  --    Liver Function Tests:  Recent Labs Lab 02/28/17 2344 03/01/17 0234 03/02/17 0543  AST 21 32 63*  ALT  13* 13* 55  ALKPHOS 50 53 49  BILITOT 2.4* 2.9* 1.5*  PROT 7.3 6.7 5.2*  ALBUMIN 4.3 4.0 2.8*    Recent Labs Lab 02/28/17 1453  LIPASE 25   No results for input(s): AMMONIA in the last 168 hours. CBC:  Recent Labs Lab 03/01/17 0234 03/02/17 0543 03/03/17 0304 03/04/17 0151 03/05/17 0406  WBC 17.2* 25.8* 17.6* 8.1 7.2  HGB 10.5* 8.5* 8.4* 7.8* 8.5*  HCT 32.6* 26.8* 26.8* 24.8* 26.5*  MCV 107.2* 108.9* 112.1* 109.7* 109.1*  PLT 286 225 218 198 232   Cardiac Enzymes:  Recent Labs Lab 03/01/17 0234 03/01/17 1011 03/01/17 1920 03/01/17 2308 03/02/17 0543  TROPONINI 0.05* 2.24* 2.47* 2.21* 1.69*   BNP: Invalid input(s): POCBNP CBG:  Recent Labs Lab 03/04/17 0711 03/04/17 1055 03/04/17 1716 03/04/17 2213 03/05/17 0756  GLUCAP 84 147* 122* 127* 123*   D-Dimer No results for input(s): DDIMER in the last 72 hours. Hgb A1c No results for input(s): HGBA1C in the last 72 hours. Lipid Profile No results for input(s): CHOL, HDL, LDLCALC, TRIG, CHOLHDL, LDLDIRECT in the last 72 hours. Thyroid function studies No results for input(s): TSH, T4TOTAL, T3FREE, THYROIDAB in the last 72 hours.  Invalid input(s): FREET3 Anemia work up No results for input(s): VITAMINB12, FOLATE, FERRITIN, TIBC, IRON, RETICCTPCT in the last 72 hours. Urinalysis    Component Value Date/Time   COLORURINE STRAW (A) 02/28/2017 2027   APPEARANCEUR CLEAR 02/28/2017 2027   LABSPEC 1.007 02/28/2017 2027   PHURINE 5.0 02/28/2017 2027   GLUCOSEU >=500 (A) 02/28/2017 2027   GLUCOSEU NEGATIVE 01/14/2014 1037   HGBUR SMALL (A) 02/28/2017 2027   BILIRUBINUR NEGATIVE 02/28/2017 2027   KETONESUR NEGATIVE 02/28/2017 2027   PROTEINUR NEGATIVE 02/28/2017 2027   UROBILINOGEN 0.2  05/24/2015 2145   NITRITE NEGATIVE 02/28/2017 2027   LEUKOCYTESUR NEGATIVE 02/28/2017 2027   Sepsis Labs Invalid input(s): PROCALCITONIN,  WBC,  LACTICIDVEN Microbiology Recent Results (from the past 240 hour(s))  Culture, blood (single)     Status: None (Preliminary result)   Collection Time: 02/28/17 11:44 PM  Result Value Ref Range Status   Specimen Description BLOOD RIGHT FOREARM  Final   Special Requests IN PEDIATRIC BOTTLE Blood Culture adequate volume  Final   Culture NO GROWTH 3 DAYS  Final   Report Status PENDING  Incomplete  MRSA PCR Screening     Status: None   Collection Time: 03/01/17  6:31 AM  Result Value Ref Range Status   MRSA by PCR NEGATIVE NEGATIVE Final    Comment:        The GeneXpert MRSA Assay (FDA approved for NASAL specimens only), is one component of a comprehensive MRSA colonization surveillance program. It is not intended to diagnose MRSA infection nor to guide or monitor treatment for MRSA infections.   Body fluid culture     Status: Abnormal (Preliminary result)   Collection Time: 03/02/17 11:54 AM  Result Value Ref Range Status   Specimen Description FLUID GALL BLADDER  Final   Special Requests NONE  Final   Gram Stain   Final    ABUNDANT WBC PRESENT,BOTH PMN AND MONONUCLEAR MODERATE GRAM POSITIVE COCCI FEW GRAM VARIABLE ROD    Culture MULTIPLE ORGANISMS PRESENT, NONE PREDOMINANT (A)  Final   Report Status PENDING  Incomplete     Time coordinating discharge: 35 minutes  SIGNED:   Rosita Fire, MD  [facility] 03/05/2017, 11:01 AM  If 7PM-7AM, please contact night-coverage [email or web address] Password [email or web address]

## 2017-03-05 NOTE — Care Management Important Message (Signed)
Important Message  Patient Details  Name: Robert [name] MRN: [id number] Date of Birth: [birth year]   Medicare Important Message Given:  Yes    [name] [name] 03/05/2017, 11:44 AM

## 2017-03-05 NOTE — Progress Notes (Signed)
Pt discharged going home health teachings given, prescriptions, next appointment, removed peripheral IV line, flushed the JP drain, son at the bedside, accompanied by Nurse tech [place], given all personal belongings, no s/s of distress noted, no discharge  Instruction to flush the JP but I informed them to call IR if there's any problem with the drain.

## 2017-03-06 LAB — CULTURE, BLOOD (SINGLE)
Culture: NO GROWTH
SPECIAL REQUESTS: ADEQUATE

## 2017-03-20 ENCOUNTER — Encounter: Payer: Self-pay | Admitting: Endocrinology

## 2017-03-20 ENCOUNTER — Ambulatory Visit (INDEPENDENT_AMBULATORY_CARE_PROVIDER_SITE_OTHER): Payer: Medicare Other | Admitting: Endocrinology

## 2017-03-20 VITALS — BP 120/76 | HR 77 | Ht 69.0 in | Wt 176.2 lb

## 2017-03-20 DIAGNOSIS — E1165 Type 2 diabetes mellitus with hyperglycemia: Secondary | ICD-10-CM

## 2017-03-20 DIAGNOSIS — Z794 Long term (current) use of insulin: Secondary | ICD-10-CM

## 2017-03-20 LAB — POCT GLYCOSYLATED HEMOGLOBIN (HGB A1C): Hemoglobin A1C: 6.9

## 2017-03-20 NOTE — Patient Instructions (Signed)
Lantus 17 units in am daily  Take 8-10 Humalog before each meal based on what you eat  Check blood sugars on waking up  daily  Also check blood sugars about 2 hours after a meal and do this after different meals by rotation  Recommended blood sugar levels on waking up is 90-130 and about 2 hours after meal is 130-160  Please bring your blood sugar monitor to each visit, thank you
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
## 2017-03-21 ENCOUNTER — Other Ambulatory Visit: Payer: Self-pay | Admitting: Interventional Cardiology
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
## 2017-03-25 ENCOUNTER — Ambulatory Visit (INDEPENDENT_AMBULATORY_CARE_PROVIDER_SITE_OTHER): Payer: Medicare Other | Admitting: Interventional Cardiology

## 2017-03-25 ENCOUNTER — Encounter: Payer: Self-pay | Admitting: Interventional Cardiology

## 2017-03-25 VITALS — BP 90/50 | HR 85 | Ht 69.0 in | Wt 176.4 lb

## 2017-03-25 DIAGNOSIS — E7800 Pure hypercholesterolemia, unspecified: Secondary | ICD-10-CM

## 2017-03-25 DIAGNOSIS — I5022 Chronic systolic (congestive) heart failure: Secondary | ICD-10-CM

## 2017-03-25 DIAGNOSIS — I059 Rheumatic mitral valve disease, unspecified: Secondary | ICD-10-CM

## 2017-03-25 DIAGNOSIS — N184 Chronic kidney disease, stage 4 (severe): Secondary | ICD-10-CM | POA: Diagnosis not present

## 2017-03-25 DIAGNOSIS — I2583 Coronary atherosclerosis due to lipid rich plaque: Secondary | ICD-10-CM

## 2017-03-25 DIAGNOSIS — I2510 Atherosclerotic heart disease of native coronary artery without angina pectoris: Secondary | ICD-10-CM | POA: Diagnosis not present

## 2017-03-25 NOTE — Patient Instructions (Signed)
Medication Instructions:  Your physician recommends that you continue on your current medications as directed. Please refer to the Current Medication list given to you today.   Labwork: None ordered  Testing/Procedures: None ordered  Follow-Up: Your physician wants you to follow-up in: 6 months with Dr. Varanasi.    Any Other Special Instructions Will Be Listed Below (If Applicable).     If you need a refill on your cardiac medications before your next appointment, please call your pharmacy.   

## 2017-04-03 ENCOUNTER — Other Ambulatory Visit: Payer: Medicare Other

## 2017-04-09 ENCOUNTER — Ambulatory Visit
Admission: RE | Admit: 2017-04-09 | Discharge: 2017-04-09 | Disposition: A | Discharge status: Home / Self Care | Payer: Medicare Other | Source: Ambulatory Visit | Attending: General Surgery | Admitting: General Surgery

## 2017-04-09 ENCOUNTER — Ambulatory Visit
Admission: RE | Admit: 2017-04-09 | Discharge: 2017-04-09 | Disposition: A | Discharge status: Home / Self Care | Payer: Medicare Other | Source: Ambulatory Visit | Attending: Radiology | Admitting: Radiology

## 2017-04-09 DIAGNOSIS — K819 Cholecystitis, unspecified: Secondary | ICD-10-CM

## 2017-04-09 DIAGNOSIS — Z978 Presence of other specified devices: Secondary | ICD-10-CM | POA: Diagnosis not present

## 2017-04-09 DIAGNOSIS — K810 Acute cholecystitis: Secondary | ICD-10-CM | POA: Diagnosis not present

## 2017-04-09 HISTORY — PX: IR RADIOLOGIST EVAL & MGMT: IMG5224
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
## 2017-04-10 DIAGNOSIS — K810 Acute cholecystitis: Secondary | ICD-10-CM | POA: Diagnosis not present

## 2017-04-11 ENCOUNTER — Other Ambulatory Visit: Payer: Self-pay | Admitting: General Surgery

## 2017-04-11 DIAGNOSIS — K819 Cholecystitis, unspecified: Secondary | ICD-10-CM

## 2017-05-15 ENCOUNTER — Other Ambulatory Visit: Payer: Self-pay | Admitting: Endocrinology

## 2017-05-20 ENCOUNTER — Ambulatory Visit (INDEPENDENT_AMBULATORY_CARE_PROVIDER_SITE_OTHER): Payer: Medicare Other | Admitting: Endocrinology

## 2017-05-20 ENCOUNTER — Encounter: Payer: Self-pay | Admitting: Endocrinology

## 2017-05-20 VITALS — BP 114/62 | HR 78 | Ht 69.0 in | Wt 177.6 lb

## 2017-05-20 DIAGNOSIS — I2510 Atherosclerotic heart disease of native coronary artery without angina pectoris: Secondary | ICD-10-CM | POA: Diagnosis not present

## 2017-05-20 DIAGNOSIS — N184 Chronic kidney disease, stage 4 (severe): Secondary | ICD-10-CM | POA: Diagnosis not present

## 2017-05-20 DIAGNOSIS — E1122 Type 2 diabetes mellitus with diabetic chronic kidney disease: Secondary | ICD-10-CM

## 2017-05-20 DIAGNOSIS — I2583 Coronary atherosclerosis due to lipid rich plaque: Secondary | ICD-10-CM

## 2017-05-20 DIAGNOSIS — E1165 Type 2 diabetes mellitus with hyperglycemia: Secondary | ICD-10-CM

## 2017-05-20 DIAGNOSIS — Z794 Long term (current) use of insulin: Secondary | ICD-10-CM | POA: Diagnosis not present

## 2017-05-20 NOTE — Patient Instructions (Signed)
Reduce fried food  LANTUS: 18 units in the morning daily  Take only 8 or 9 units of Humalog at breakfast  If eating a light meal with less starchy or low fat food at lunchtime take only 7 or 8 units  If the blood sugar is over 300 AFTER supper do not take more than 3 or 4 units extra
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
## 2017-05-23 ENCOUNTER — Ambulatory Visit
Admission: RE | Admit: 2017-05-23 | Discharge: 2017-05-23 | Disposition: A | Discharge status: Home / Self Care | Payer: Medicare Other | Source: Ambulatory Visit | Attending: General Surgery | Admitting: General Surgery

## 2017-05-23 ENCOUNTER — Other Ambulatory Visit: Payer: Self-pay | Admitting: Endocrinology

## 2017-05-23 ENCOUNTER — Other Ambulatory Visit: Payer: Self-pay | Admitting: Interventional Radiology

## 2017-05-23 ENCOUNTER — Encounter: Payer: Medicare Other | Admitting: Dietician

## 2017-05-23 DIAGNOSIS — Z4659 Encounter for fitting and adjustment of other gastrointestinal appliance and device: Secondary | ICD-10-CM | POA: Diagnosis not present

## 2017-05-23 DIAGNOSIS — K819 Cholecystitis, unspecified: Secondary | ICD-10-CM

## 2017-05-23 DIAGNOSIS — E1121 Type 2 diabetes mellitus with diabetic nephropathy: Secondary | ICD-10-CM

## 2017-05-23 DIAGNOSIS — Z794 Long term (current) use of insulin: Principal | ICD-10-CM

## 2017-05-23 DIAGNOSIS — E1165 Type 2 diabetes mellitus with hyperglycemia: Principal | ICD-10-CM

## 2017-05-23 DIAGNOSIS — IMO0002 Reserved for concepts with insufficient information to code with codable children: Secondary | ICD-10-CM

## 2017-05-23 DIAGNOSIS — K8000 Calculus of gallbladder with acute cholecystitis without obstruction: Secondary | ICD-10-CM

## 2017-05-23 HISTORY — PX: IR RADIOLOGIST EVAL & MGMT: IMG5224
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
## 2017-05-30 ENCOUNTER — Other Ambulatory Visit: Payer: Self-pay | Admitting: Interventional Radiology

## 2017-05-30 ENCOUNTER — Ambulatory Visit (HOSPITAL_COMMUNITY)
Admission: RE | Admit: 2017-05-30 | Discharge: 2017-05-30 | Disposition: A | Discharge status: Home / Self Care | Payer: Medicare Other | Source: Ambulatory Visit | Attending: Interventional Radiology | Admitting: Interventional Radiology

## 2017-05-30 ENCOUNTER — Other Ambulatory Visit (HOSPITAL_COMMUNITY): Payer: Self-pay | Admitting: Interventional Radiology

## 2017-05-30 DIAGNOSIS — K8000 Calculus of gallbladder with acute cholecystitis without obstruction: Secondary | ICD-10-CM

## 2017-05-30 DIAGNOSIS — Z4682 Encounter for fitting and adjustment of non-vascular catheter: Secondary | ICD-10-CM | POA: Diagnosis not present

## 2017-05-30 DIAGNOSIS — Z434 Encounter for attention to other artificial openings of digestive tract: Secondary | ICD-10-CM | POA: Diagnosis not present

## 2017-05-30 DIAGNOSIS — K819 Cholecystitis, unspecified: Secondary | ICD-10-CM

## 2017-05-30 HISTORY — PX: IR EXCHANGE BILIARY DRAIN: IMG6046

## 2017-05-30 MED ORDER — IOPAMIDOL (ISOVUE-300) INJECTION 61%
INTRAVENOUS | Status: AC
  Filled 2017-05-30: qty 50

## 2017-05-30 MED ORDER — LIDOCAINE HCL (PF) 2 % IJ SOLN
INTRAMUSCULAR | Status: AC | PRN
  Administered 2017-05-30: 10 mL

## 2017-05-30 MED ORDER — LIDOCAINE HCL (PF) 2 % IJ SOLN
INTRAMUSCULAR | Status: AC
  Filled 2017-05-30: qty 10

## 2017-05-30 MED ORDER — IOPAMIDOL (ISOVUE-300) INJECTION 61%
10.0000 mL | Freq: Once | INTRAVENOUS | Status: AC | PRN
  Administered 2017-05-30: 10 mL

## 2017-05-30 NOTE — Procedures (Signed)
10 Fr chole drain exchange EBL 0 Comp 0

## 2017-05-31 ENCOUNTER — Encounter (HOSPITAL_COMMUNITY): Payer: Self-pay | Admitting: Interventional Radiology

## 2017-06-20 ENCOUNTER — Other Ambulatory Visit: Payer: Self-pay | Admitting: Endocrinology

## 2017-06-24 DIAGNOSIS — N189 Chronic kidney disease, unspecified: Secondary | ICD-10-CM | POA: Diagnosis not present

## 2017-06-24 DIAGNOSIS — N184 Chronic kidney disease, stage 4 (severe): Secondary | ICD-10-CM | POA: Diagnosis not present

## 2017-06-24 DIAGNOSIS — N2581 Secondary hyperparathyroidism of renal origin: Secondary | ICD-10-CM | POA: Diagnosis not present

## 2017-06-27 ENCOUNTER — Other Ambulatory Visit: Payer: Self-pay

## 2017-06-27 MED ORDER — INSULIN GLARGINE 100 UNIT/ML SOLOSTAR PEN: PEN_INJECTOR | SUBCUTANEOUS | 2 refills | Status: DC

## 2017-06-28 ENCOUNTER — Telehealth: Payer: Self-pay | Admitting: Endocrinology

## 2017-06-28 NOTE — Telephone Encounter (Signed)
MEDICATION:   insulin lispro (HUMALOG KWIKPEN) 100 UNIT/ML KiwkPen     PHARMACY:   [id number] - [place], [place] - [address] [phone] (Phone) [phone] (Fax)    IS THIS A 90 DAY SUPPLY : yes  IS PATIENT OUT OF MEDICATION: yes   OTHER COMMENTS: Patient is also requesting samples of the medication if possible to pick up from Endo. Call patient to advise.    **Let patient know to contact pharmacy at the end of the day to make sure medication is ready. **  ** Please notify patient to allow 48-72 hours to process**  **Encourage patient to contact the pharmacy for refills or they can request refills through [facility]**

## 2017-07-01 ENCOUNTER — Other Ambulatory Visit: Payer: Self-pay

## 2017-07-01 ENCOUNTER — Telehealth: Payer: Self-pay | Admitting: Endocrinology

## 2017-07-01 MED ORDER — INSULIN LISPRO 100 UNIT/ML (KWIKPEN): 3.0000 [IU] | PEN_INJECTOR | Freq: Three times a day (TID) | SUBCUTANEOUS | 2 refills | Status: DC

## 2017-07-01 MED ORDER — INSULIN GLARGINE 100 UNIT/ML SOLOSTAR PEN: PEN_INJECTOR | SUBCUTANEOUS | 2 refills | Status: DC

## 2017-07-01 NOTE — Telephone Encounter (Signed)
Need refill for Humalog, send to  [facility]

## 2017-07-01 NOTE — Telephone Encounter (Signed)
Can you please schedule Robert [name] for his follow up with Dr. Dwyane Dee? He had to cancel on 07/19/2017 and I tried to give him a new appointment but for some reason the entire schedule would not allow me to put him in and I was not able to pull up any available times?? Please help!! Thanks!

## 2017-07-01 NOTE — Telephone Encounter (Signed)
Called patient and let him know that I have sent over his prescription for the Humalog. He stated that his Lantus will be over $400 and I have let him know that I am going to leave his copay card in an envelope up front and he stated that he will be able to pick up on Wednesday.

## 2017-07-03 ENCOUNTER — Telehealth: Payer: Self-pay

## 2017-07-03 DIAGNOSIS — N189 Chronic kidney disease, unspecified: Secondary | ICD-10-CM | POA: Diagnosis not present

## 2017-07-03 DIAGNOSIS — I255 Ischemic cardiomyopathy: Secondary | ICD-10-CM | POA: Diagnosis not present

## 2017-07-03 DIAGNOSIS — Z794 Long term (current) use of insulin: Secondary | ICD-10-CM | POA: Diagnosis not present

## 2017-07-03 DIAGNOSIS — D631 Anemia in chronic kidney disease: Secondary | ICD-10-CM | POA: Diagnosis not present

## 2017-07-03 DIAGNOSIS — N184 Chronic kidney disease, stage 4 (severe): Secondary | ICD-10-CM | POA: Diagnosis not present

## 2017-07-03 DIAGNOSIS — I129 Hypertensive chronic kidney disease with stage 1 through stage 4 chronic kidney disease, or unspecified chronic kidney disease: Secondary | ICD-10-CM | POA: Diagnosis not present

## 2017-07-03 DIAGNOSIS — Z23 Encounter for immunization: Secondary | ICD-10-CM | POA: Diagnosis not present

## 2017-07-03 DIAGNOSIS — N2581 Secondary hyperparathyroidism of renal origin: Secondary | ICD-10-CM | POA: Diagnosis not present

## 2017-07-03 DIAGNOSIS — E1121 Type 2 diabetes mellitus with diabetic nephropathy: Secondary | ICD-10-CM | POA: Diagnosis not present

## 2017-07-03 NOTE — Telephone Encounter (Signed)
I have filled out application for [employer] and it is on Dr. Ronnie Derby desk awaiting signature

## 2017-07-03 NOTE — Telephone Encounter (Signed)
Patient came into the office today requesting patient assistance for his Lantus- he stated he is in the donut hole and can not afford his medications please advise on which program we can start for him to help him with this

## 2017-07-03 NOTE — Telephone Encounter (Signed)
[employer]

## 2017-07-08 ENCOUNTER — Encounter: Payer: Self-pay | Admitting: Interventional Radiology

## 2017-07-09 ENCOUNTER — Other Ambulatory Visit: Payer: Self-pay

## 2017-07-09 NOTE — Telephone Encounter (Signed)
[employer] called back needing more information. They are missing pt's proof of income. Please advise, thank you!

## 2017-07-09 NOTE — Telephone Encounter (Signed)
Called and left a vm requesting the patient call me back to discuss the application for [employer] for his Lantus

## 2017-07-09 NOTE — Telephone Encounter (Signed)
Have already spoken to [employer] and made them aware that we do not handle the patients financials- spoke with the patient and he has agreed to bring in his w2's and I will fax to [employer]

## 2017-07-11 ENCOUNTER — Other Ambulatory Visit: Payer: Self-pay | Admitting: Endocrinology

## 2017-07-17 NOTE — Telephone Encounter (Signed)
Patient has been approved for patient assistance through [employer] for his Lantus approved through 07/15/18 called patient and left him a vm making him aware of this

## 2017-07-19 ENCOUNTER — Ambulatory Visit: Payer: Medicare Other | Admitting: Endocrinology

## 2017-07-25 ENCOUNTER — Telehealth: Payer: Self-pay

## 2017-07-25 NOTE — Telephone Encounter (Signed)
LM for pt to call back to schedule °

## 2017-07-25 NOTE — Telephone Encounter (Signed)
Called Mr Tata and left a voice message to let him know that his Lantus Insulin pens are ready for him to pick up at our office.

## 2017-07-26 ENCOUNTER — Telehealth: Payer: Self-pay | Admitting: Endocrinology

## 2017-07-26 NOTE — Telephone Encounter (Signed)
Patient picked up his medication today.LA

## 2017-07-31 ENCOUNTER — Ambulatory Visit (INDEPENDENT_AMBULATORY_CARE_PROVIDER_SITE_OTHER): Payer: Medicare Other | Admitting: Ophthalmology

## 2017-07-31 DIAGNOSIS — I10 Essential (primary) hypertension: Secondary | ICD-10-CM

## 2017-07-31 DIAGNOSIS — E113392 Type 2 diabetes mellitus with moderate nonproliferative diabetic retinopathy without macular edema, left eye: Secondary | ICD-10-CM

## 2017-07-31 DIAGNOSIS — E113291 Type 2 diabetes mellitus with mild nonproliferative diabetic retinopathy without macular edema, right eye: Secondary | ICD-10-CM

## 2017-07-31 DIAGNOSIS — H2513 Age-related nuclear cataract, bilateral: Secondary | ICD-10-CM

## 2017-07-31 DIAGNOSIS — H35033 Hypertensive retinopathy, bilateral: Secondary | ICD-10-CM

## 2017-07-31 DIAGNOSIS — H353121 Nonexudative age-related macular degeneration, left eye, early dry stage: Secondary | ICD-10-CM | POA: Diagnosis not present

## 2017-07-31 DIAGNOSIS — E11319 Type 2 diabetes mellitus with unspecified diabetic retinopathy without macular edema: Secondary | ICD-10-CM | POA: Diagnosis not present

## 2017-07-31 DIAGNOSIS — H43813 Vitreous degeneration, bilateral: Secondary | ICD-10-CM

## 2017-08-08 ENCOUNTER — Ambulatory Visit (INDEPENDENT_AMBULATORY_CARE_PROVIDER_SITE_OTHER): Payer: Medicare Other | Admitting: Endocrinology

## 2017-08-08 ENCOUNTER — Encounter: Payer: Self-pay | Admitting: Endocrinology

## 2017-08-08 VITALS — BP 102/58 | HR 79 | Ht 69.0 in | Wt 177.0 lb

## 2017-08-08 DIAGNOSIS — E1122 Type 2 diabetes mellitus with diabetic chronic kidney disease: Secondary | ICD-10-CM | POA: Diagnosis not present

## 2017-08-08 DIAGNOSIS — E1165 Type 2 diabetes mellitus with hyperglycemia: Secondary | ICD-10-CM | POA: Diagnosis not present

## 2017-08-08 DIAGNOSIS — I2583 Coronary atherosclerosis due to lipid rich plaque: Secondary | ICD-10-CM | POA: Diagnosis not present

## 2017-08-08 DIAGNOSIS — N184 Chronic kidney disease, stage 4 (severe): Secondary | ICD-10-CM

## 2017-08-08 DIAGNOSIS — Z794 Long term (current) use of insulin: Secondary | ICD-10-CM

## 2017-08-08 DIAGNOSIS — I2510 Atherosclerotic heart disease of native coronary artery without angina pectoris: Secondary | ICD-10-CM

## 2017-08-08 LAB — POCT GLYCOSYLATED HEMOGLOBIN (HGB A1C): Hemoglobin A1C: 7.1

## 2017-08-08 LAB — GLUCOSE, POCT (MANUAL RESULT ENTRY): POC GLUCOSE: 45 mg/dL — AB (ref 70–99)

## 2017-08-08 NOTE — Patient Instructions (Signed)
RULES for taking HUMALOG  Do not take the Humalog more than 10 minutes before the meal If you're planning to eat out take the Humalog with you and take it right before you get your meal  INCREASE the dose of Humalog to 10 units before supper time regardless of blood sugar level, if the blood sugar is over 200 may take additional 1-2 units also  If you forget to take the HUMALOG before the meal do not take it more than 20 minutes after the meal  LANTUS: If you are taking 15 units currently increase the dose to 16 units  Try to  have more low fat meals and avoid fried food  Have a protein with every meal including breakfast
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
## 2017-08-20 ENCOUNTER — Telehealth: Payer: Self-pay

## 2017-08-20 NOTE — Telephone Encounter (Signed)
I have faxed [employer] form for Lantus for this patient today

## 2017-08-22 ENCOUNTER — Encounter (HOSPITAL_COMMUNITY): Payer: Self-pay | Admitting: Interventional Radiology

## 2017-08-22 ENCOUNTER — Other Ambulatory Visit (HOSPITAL_COMMUNITY): Payer: Self-pay | Admitting: Interventional Radiology

## 2017-08-22 ENCOUNTER — Ambulatory Visit (HOSPITAL_COMMUNITY)
Admission: RE | Admit: 2017-08-22 | Discharge: 2017-08-22 | Disposition: A | Discharge status: Home / Self Care | Payer: Medicare Other | Source: Ambulatory Visit | Attending: Interventional Radiology | Admitting: Interventional Radiology

## 2017-08-22 DIAGNOSIS — Z4682 Encounter for fitting and adjustment of non-vascular catheter: Secondary | ICD-10-CM | POA: Insufficient documentation

## 2017-08-22 DIAGNOSIS — K811 Chronic cholecystitis: Secondary | ICD-10-CM | POA: Insufficient documentation

## 2017-08-22 DIAGNOSIS — K819 Cholecystitis, unspecified: Secondary | ICD-10-CM

## 2017-08-22 DIAGNOSIS — K8012 Calculus of gallbladder with acute and chronic cholecystitis without obstruction: Secondary | ICD-10-CM | POA: Diagnosis not present

## 2017-08-22 HISTORY — PX: IR EXCHANGE BILIARY DRAIN: IMG6046

## 2017-08-22 MED ORDER — IOPAMIDOL (ISOVUE-300) INJECTION 61%
INTRAVENOUS | Status: AC
  Administered 2017-08-22: 10 mL
  Filled 2017-08-22: qty 50

## 2017-08-22 MED ORDER — IOPAMIDOL (ISOVUE-370) INJECTION 76%: 50.0000 mL | Freq: Once | INTRAVENOUS | Status: DC | PRN

## 2017-08-22 MED ORDER — LIDOCAINE HCL (PF) 1 % IJ SOLN
INTRAMUSCULAR | Status: AC
  Filled 2017-08-22: qty 30

## 2017-08-22 MED ORDER — IOPAMIDOL (ISOVUE-300) INJECTION 61%
50.0000 mL | Freq: Once | INTRAVENOUS | Status: AC | PRN
  Administered 2017-08-22: 10 mL

## 2017-08-22 NOTE — Procedures (Signed)
Chronic calculus cholecystitis  Fluoro exchg of the cholecystostomy  No comp Stable Full report in pacs

## 2017-09-12 ENCOUNTER — Other Ambulatory Visit: Payer: Self-pay | Admitting: Interventional Cardiology
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
## 2017-09-30 ENCOUNTER — Encounter: Payer: Self-pay | Admitting: Interventional Cardiology

## 2017-09-30 ENCOUNTER — Ambulatory Visit (INDEPENDENT_AMBULATORY_CARE_PROVIDER_SITE_OTHER): Payer: Medicare Other | Admitting: Interventional Cardiology

## 2017-09-30 ENCOUNTER — Encounter: Payer: Self-pay | Admitting: Endocrinology

## 2017-09-30 ENCOUNTER — Ambulatory Visit (INDEPENDENT_AMBULATORY_CARE_PROVIDER_SITE_OTHER): Payer: Medicare Other | Admitting: Endocrinology

## 2017-09-30 VITALS — BP 116/58 | HR 71 | Ht 69.0 in | Wt 178.0 lb

## 2017-09-30 VITALS — BP 118/60 | HR 89 | Ht 69.0 in | Wt 178.0 lb

## 2017-09-30 DIAGNOSIS — E7800 Pure hypercholesterolemia, unspecified: Secondary | ICD-10-CM | POA: Diagnosis not present

## 2017-09-30 DIAGNOSIS — I2583 Coronary atherosclerosis due to lipid rich plaque: Secondary | ICD-10-CM | POA: Diagnosis not present

## 2017-09-30 DIAGNOSIS — E1122 Type 2 diabetes mellitus with diabetic chronic kidney disease: Secondary | ICD-10-CM | POA: Diagnosis not present

## 2017-09-30 DIAGNOSIS — I2510 Atherosclerotic heart disease of native coronary artery without angina pectoris: Secondary | ICD-10-CM

## 2017-09-30 DIAGNOSIS — I5022 Chronic systolic (congestive) heart failure: Secondary | ICD-10-CM | POA: Diagnosis not present

## 2017-09-30 DIAGNOSIS — I059 Rheumatic mitral valve disease, unspecified: Secondary | ICD-10-CM

## 2017-09-30 DIAGNOSIS — Z794 Long term (current) use of insulin: Secondary | ICD-10-CM | POA: Diagnosis not present

## 2017-09-30 DIAGNOSIS — N184 Chronic kidney disease, stage 4 (severe): Secondary | ICD-10-CM

## 2017-09-30 DIAGNOSIS — E1165 Type 2 diabetes mellitus with hyperglycemia: Secondary | ICD-10-CM

## 2017-09-30 LAB — COMPREHENSIVE METABOLIC PANEL
ALBUMIN: 4.4 g/dL (ref 3.5–4.7)
ALT: 11 IU/L (ref 0–44)
AST: 20 IU/L (ref 0–40)
Albumin/Globulin Ratio: 2.2 (ref 1.2–2.2)
Alkaline Phosphatase: 51 IU/L (ref 39–117)
BUN / CREAT RATIO: 13 (ref 10–24)
BUN: 31 mg/dL — AB (ref 8–27)
Bilirubin Total: 0.8 mg/dL (ref 0.0–1.2)
CO2: 24 mmol/L (ref 20–29)
CREATININE: 2.3 mg/dL — AB (ref 0.76–1.27)
Calcium: 9.5 mg/dL (ref 8.6–10.2)
Chloride: 102 mmol/L (ref 96–106)
GFR calc non Af Amer: 25 mL/min/{1.73_m2} — ABNORMAL LOW (ref >59)
GFR, EST AFRICAN AMERICAN: 28 mL/min/{1.73_m2} — AB (ref >59)
Globulin, Total: 2 g/dL (ref 1.5–4.5)
Glucose: 174 mg/dL — ABNORMAL HIGH (ref 65–99)
Potassium: 5.2 mmol/L (ref 3.5–5.2)
Sodium: 142 mmol/L (ref 134–144)
TOTAL PROTEIN: 6.4 g/dL (ref 6.0–8.5)

## 2017-09-30 LAB — LIPID PANEL
CHOLESTEROL TOTAL: 129 mg/dL (ref 100–199)
Chol/HDL Ratio: 1.8 ratio (ref 0.0–5.0)
HDL: 73 mg/dL (ref >39)
LDL CALC: 46 mg/dL (ref 0–99)
Triglycerides: 49 mg/dL (ref 0–149)
VLDL CHOLESTEROL CAL: 10 mg/dL (ref 5–40)

## 2017-09-30 NOTE — Patient Instructions (Signed)
Medication Instructions:  Your physician recommends that you continue on your current medications as directed. Please refer to the Current Medication list given to you today.   Labwork: TODAY: CMET, LIPIDS  Testing/Procedures: None ordered  Follow-Up: Your physician wants you to follow-up in: 6 months with Dr. [name]. You will receive a reminder letter in the mail two months in advance. If you don't receive a letter, please call our office to schedule the follow-up appointment.   Any Other Special Instructions Will Be Listed Below (If Applicable).     If you need a refill on your cardiac medications before your next appointment, please call your pharmacy.

## 2017-09-30 NOTE — Patient Instructions (Addendum)
NO HUMALOG AT BEDTIME   HUMALOG AT LEAST 10 AT SUPPER TIME, 12 for fried food or more sweets  No Humalog unless ready to eat
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
## 2017-10-15 ENCOUNTER — Telehealth: Payer: Self-pay | Admitting: Endocrinology

## 2017-10-18 ENCOUNTER — Ambulatory Visit (HOSPITAL_COMMUNITY)
Admission: RE | Admit: 2017-10-18 | Discharge: 2017-10-18 | Disposition: A | Discharge status: Home / Self Care | Payer: Medicare Other | Source: Ambulatory Visit | Attending: Radiology | Admitting: Radiology

## 2017-10-18 ENCOUNTER — Encounter (HOSPITAL_COMMUNITY): Payer: Self-pay | Admitting: Interventional Radiology

## 2017-10-18 ENCOUNTER — Other Ambulatory Visit (HOSPITAL_COMMUNITY): Payer: Self-pay | Admitting: Radiology

## 2017-10-18 DIAGNOSIS — Z4682 Encounter for fitting and adjustment of non-vascular catheter: Secondary | ICD-10-CM | POA: Insufficient documentation

## 2017-10-18 DIAGNOSIS — K811 Chronic cholecystitis: Secondary | ICD-10-CM | POA: Diagnosis not present

## 2017-10-18 DIAGNOSIS — K819 Cholecystitis, unspecified: Secondary | ICD-10-CM | POA: Diagnosis not present

## 2017-10-18 HISTORY — PX: IR CHOLANGIOGRAM EXISTING TUBE: IMG6040

## 2017-10-18 MED ORDER — IOPAMIDOL (ISOVUE-300) INJECTION 61%
INTRAVENOUS | Status: AC
  Filled 2017-10-18: qty 50

## 2017-10-18 MED ORDER — IOPAMIDOL (ISOVUE-300) INJECTION 61%
50.0000 mL | Freq: Once | INTRAVENOUS | Status: AC | PRN
  Administered 2017-10-18: 20 mL

## 2017-10-29 ENCOUNTER — Telehealth: Payer: Self-pay

## 2017-10-29 ENCOUNTER — Other Ambulatory Visit (HOSPITAL_COMMUNITY): Payer: Self-pay | Admitting: Radiology

## 2017-10-29 ENCOUNTER — Encounter (HOSPITAL_COMMUNITY): Payer: Self-pay | Admitting: Interventional Radiology

## 2017-10-29 ENCOUNTER — Ambulatory Visit (HOSPITAL_COMMUNITY)
Admission: RE | Admit: 2017-10-29 | Discharge: 2017-10-29 | Disposition: A | Discharge status: Home / Self Care | Payer: Medicare Other | Source: Ambulatory Visit | Attending: Radiology | Admitting: Radiology

## 2017-10-29 DIAGNOSIS — K819 Cholecystitis, unspecified: Secondary | ICD-10-CM

## 2017-10-29 DIAGNOSIS — T85520A Displacement of bile duct prosthesis, initial encounter: Secondary | ICD-10-CM | POA: Insufficient documentation

## 2017-10-29 DIAGNOSIS — Y838 Other surgical procedures as the cause of abnormal reaction of the patient, or of later complication, without mention of misadventure at the time of the procedure: Secondary | ICD-10-CM | POA: Diagnosis not present

## 2017-10-29 DIAGNOSIS — T85638A Leakage of other specified internal prosthetic devices, implants and grafts, initial encounter: Secondary | ICD-10-CM | POA: Diagnosis not present

## 2017-10-29 HISTORY — PX: IR EXCHANGE BILIARY DRAIN: IMG6046

## 2017-10-29 MED ORDER — LIDOCAINE HCL (PF) 1 % IJ SOLN
INTRAMUSCULAR | Status: AC
  Filled 2017-10-29: qty 30

## 2017-10-29 MED ORDER — LIDOCAINE-EPINEPHRINE (PF) 2 %-1:200000 IJ SOLN
INTRAMUSCULAR | Status: AC | PRN
  Administered 2017-10-29: 10 mL

## 2017-10-29 MED ORDER — IOPAMIDOL (ISOVUE-300) INJECTION 61%
INTRAVENOUS | Status: AC
  Administered 2017-10-29: 10 mL
  Filled 2017-10-29: qty 50

## 2017-10-29 MED ORDER — IOPAMIDOL (ISOVUE-300) INJECTION 61%
10.0000 mL | Freq: Once | INTRAVENOUS | Status: AC | PRN
  Administered 2017-10-29: 10 mL

## 2017-10-29 NOTE — Procedures (Signed)
Interventional Radiology Procedure Note  Procedure: Image guided exchange of perc chole tube, which had become occluded.  New 54F drain placed to gravity.   Findings: The cystic duct looks patent.  The CBD is partially patent, with debris or stones in the CBD.  ERCP may be useful.  .  Complications: None  Recommendations:  - Routine change in ~10 weeks.  - Do not submerge  - Routine drain care   Signed,  Dulcy Fanny. Earleen Newport, DO

## 2017-10-29 NOTE — Telephone Encounter (Signed)
Called patient and let him know that I have received his Lantus from [place] and we have it in the fridge. I have also included a Humalog pen since he is almost out.  Lattie Haw, please start the patient assistance with [employer] for this patient for the Humalog Kwikpens. Thank you!

## 2017-10-29 NOTE — Telephone Encounter (Signed)
Filled out new application for [employer] for Humalog for the patient and notified him it was ready for pick-up

## 2017-10-29 NOTE — Telephone Encounter (Signed)
Patient signed [employer] patient assistance form and it is on Dr. Ronnie Derby desk awaiting signature

## 2017-11-06 DIAGNOSIS — N184 Chronic kidney disease, stage 4 (severe): Secondary | ICD-10-CM | POA: Diagnosis not present

## 2017-11-06 DIAGNOSIS — N2581 Secondary hyperparathyroidism of renal origin: Secondary | ICD-10-CM | POA: Diagnosis not present

## 2017-11-06 DIAGNOSIS — N189 Chronic kidney disease, unspecified: Secondary | ICD-10-CM | POA: Diagnosis not present

## 2017-11-07 NOTE — Telephone Encounter (Signed)
This has been faxed.

## 2017-11-14 DIAGNOSIS — I129 Hypertensive chronic kidney disease with stage 1 through stage 4 chronic kidney disease, or unspecified chronic kidney disease: Secondary | ICD-10-CM | POA: Diagnosis not present

## 2017-11-14 DIAGNOSIS — N189 Chronic kidney disease, unspecified: Secondary | ICD-10-CM | POA: Diagnosis not present

## 2017-11-14 DIAGNOSIS — N2581 Secondary hyperparathyroidism of renal origin: Secondary | ICD-10-CM | POA: Diagnosis not present

## 2017-11-14 DIAGNOSIS — D631 Anemia in chronic kidney disease: Secondary | ICD-10-CM | POA: Diagnosis not present

## 2017-11-14 DIAGNOSIS — N184 Chronic kidney disease, stage 4 (severe): Secondary | ICD-10-CM | POA: Diagnosis not present

## 2017-11-14 NOTE — Telephone Encounter (Signed)
error 

## 2017-11-21 ENCOUNTER — Other Ambulatory Visit (HOSPITAL_COMMUNITY): Payer: Medicare Other

## 2017-11-28 ENCOUNTER — Encounter: Payer: Self-pay | Admitting: Endocrinology

## 2017-11-28 ENCOUNTER — Ambulatory Visit (INDEPENDENT_AMBULATORY_CARE_PROVIDER_SITE_OTHER): Payer: Medicare Other | Admitting: Endocrinology

## 2017-11-28 VITALS — BP 110/56 | HR 54 | Ht 69.0 in | Wt 176.0 lb

## 2017-11-28 DIAGNOSIS — I2583 Coronary atherosclerosis due to lipid rich plaque: Secondary | ICD-10-CM | POA: Diagnosis not present

## 2017-11-28 DIAGNOSIS — I2510 Atherosclerotic heart disease of native coronary artery without angina pectoris: Secondary | ICD-10-CM | POA: Diagnosis not present

## 2017-11-28 DIAGNOSIS — Z794 Long term (current) use of insulin: Secondary | ICD-10-CM | POA: Diagnosis not present

## 2017-11-28 DIAGNOSIS — E1165 Type 2 diabetes mellitus with hyperglycemia: Secondary | ICD-10-CM | POA: Diagnosis not present

## 2017-11-28 LAB — POCT GLYCOSYLATED HEMOGLOBIN (HGB A1C): Hemoglobin A1C: 7.4

## 2017-11-28 NOTE — Patient Instructions (Addendum)
Must ALWAYS take Humalog BEFORE EACH meal regardless of checking sugar or sugar level, take at least 7 units humalog even if sugar near 100
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
## 2017-11-28 NOTE — Addendum Note (Signed)
Addended by: Nile Riggs on: 11/28/2017 01:34 PM   Modules accepted: Orders

## 2017-12-17 ENCOUNTER — Other Ambulatory Visit (HOSPITAL_COMMUNITY): Payer: Self-pay | Admitting: Interventional Radiology

## 2017-12-17 ENCOUNTER — Ambulatory Visit (HOSPITAL_COMMUNITY)
Admission: RE | Admit: 2017-12-17 | Discharge: 2017-12-17 | Disposition: A | Discharge status: Home / Self Care | Payer: Medicare Other | Source: Ambulatory Visit | Attending: Interventional Radiology | Admitting: Interventional Radiology

## 2017-12-17 ENCOUNTER — Encounter (HOSPITAL_COMMUNITY): Payer: Self-pay | Admitting: Interventional Radiology

## 2017-12-17 DIAGNOSIS — K810 Acute cholecystitis: Secondary | ICD-10-CM | POA: Diagnosis not present

## 2017-12-17 DIAGNOSIS — K819 Cholecystitis, unspecified: Secondary | ICD-10-CM

## 2017-12-17 HISTORY — PX: IR CHOLANGIOGRAM EXISTING TUBE: IMG6040

## 2017-12-17 MED ORDER — IOPAMIDOL (ISOVUE-300) INJECTION 61%
INTRAVENOUS | Status: AC
  Filled 2017-12-17: qty 50

## 2017-12-17 MED ORDER — IOPAMIDOL (ISOVUE-300) INJECTION 61%
10.0000 mL | Freq: Once | INTRAVENOUS | Status: AC | PRN
  Administered 2017-12-17: 10 mL

## 2017-12-17 NOTE — Procedures (Signed)
Interventional Radiology Procedure Note  History: Patient tells [place] that he scheduled the add-on appointment today because he is not having as much output. He is scheduled for routine exchange in a few months.   Procedure: Fluoro injection of the perc chole..   Findings: Chole tube in the GB.  No obstruction of the drain.  There are no filling defects of the cystic duct or the CBD, with free flow across the ampulla.   Complications: None  Recommendations:  - TO gravity - Routine care. - Return for scheduled exchange with IR in the future - Do not submerge for 7 days  - follow up with surgery as scheduled  Signed,  Dulcy Fanny. Earleen Newport, DO

## 2017-12-19 ENCOUNTER — Other Ambulatory Visit (HOSPITAL_COMMUNITY): Payer: Medicare Other

## 2018-01-07 ENCOUNTER — Other Ambulatory Visit (HOSPITAL_COMMUNITY): Payer: Medicare Other

## 2018-01-14 ENCOUNTER — Encounter (HOSPITAL_COMMUNITY): Payer: Self-pay | Admitting: Interventional Radiology

## 2018-01-14 ENCOUNTER — Other Ambulatory Visit (HOSPITAL_COMMUNITY): Payer: Self-pay | Admitting: Interventional Radiology

## 2018-01-14 ENCOUNTER — Ambulatory Visit (HOSPITAL_COMMUNITY)
Admission: RE | Admit: 2018-01-14 | Discharge: 2018-01-14 | Disposition: A | Discharge status: Home / Self Care | Payer: Medicare Other | Source: Ambulatory Visit | Attending: Interventional Radiology | Admitting: Interventional Radiology

## 2018-01-14 DIAGNOSIS — T85590A Other mechanical complication of bile duct prosthesis, initial encounter: Secondary | ICD-10-CM | POA: Diagnosis not present

## 2018-01-14 DIAGNOSIS — K819 Cholecystitis, unspecified: Secondary | ICD-10-CM

## 2018-01-14 DIAGNOSIS — Z4682 Encounter for fitting and adjustment of non-vascular catheter: Secondary | ICD-10-CM | POA: Diagnosis not present

## 2018-01-14 HISTORY — PX: IR CHOLANGIOGRAM EXISTING TUBE: IMG6040

## 2018-01-14 MED ORDER — IOPAMIDOL (ISOVUE-300) INJECTION 61%
50.0000 mL | Freq: Once | INTRAVENOUS | Status: AC | PRN
  Administered 2018-01-14: 20 mL

## 2018-01-14 MED ORDER — IOPAMIDOL (ISOVUE-300) INJECTION 61%
INTRAVENOUS | Status: AC
  Administered 2018-01-14: 20 mL
  Filled 2018-01-14: qty 50

## 2018-01-16 ENCOUNTER — Telehealth: Payer: Self-pay

## 2018-01-16 NOTE — Telephone Encounter (Signed)
Patient came in today and stated he received a letter from [facility] and he needs additional information. I called [employer] and and spoke with rep-rep stated patient needs to send in award letter from social security or a current tax return. The one sent was from 2016 and they need one from 2018. Rep also asked if patient has Medicare part D or [employer] which would be prescription coverage if so patient would have to show proof of at least 1,100 out-of-pocket expense he has paid for prescriptions this year or I can create a hardship letter with the MD signature stating patient can not afford his diabetic medications- I called patient to give him this information and he was not in so I left him a vm requesting he call me back to discuss

## 2018-01-22 ENCOUNTER — Telehealth: Payer: Self-pay

## 2018-01-22 NOTE — Telephone Encounter (Signed)
Spoke with patients and informed him that his insurance is requiring documentation of either 2018 tax returns or a letter from social security stating his yearly income. Patient then began stating that all he has is a monthly statement from his bank. Patient then stated that he cannot go to social security and wait a half day to get a sheet of paper.   Called patients son and son verbalized understanding and stated that he will get with his father and get one of the two needed forms. Patient's son stated that he understood what insurance was requiring and his dad must have been misunderstanding. Son stated that he will attempt to obtain this paperwork today and give [place] a call tomorrow.

## 2018-01-22 NOTE — Telephone Encounter (Signed)
Pt's son [name] called back and stated that he called his dad and re-explained everything to patient and he then understood. Patient stated to son that he would bring needed paperwork to office when he could.

## 2018-01-27 ENCOUNTER — Telehealth: Payer: Self-pay

## 2018-01-27 NOTE — Telephone Encounter (Signed)
Please advise 

## 2018-01-27 NOTE — Telephone Encounter (Signed)
Please ask patient if he can supply the documentation

## 2018-01-27 NOTE — Telephone Encounter (Signed)
[facility] patient assistance foundation in regards to getting patient assistance for Humalog. [employer] stated that patient must submit copy of social security yearly earnings and proof of out of pocket expenses totaling in $1,100 since 09/24/17, or in place of the out of pocket expense, submit a letter signed by the patient and the doctor stating "Please waive out of pocket exchange of $1,100 due to financial hardship."

## 2018-01-27 NOTE — Telephone Encounter (Signed)
He can't, therefore he needs the letter.

## 2018-01-29 ENCOUNTER — Encounter: Payer: Self-pay | Admitting: Endocrinology

## 2018-01-29 NOTE — Telephone Encounter (Signed)
This is ready

## 2018-01-30 ENCOUNTER — Telehealth: Payer: Self-pay | Admitting: Endocrinology

## 2018-01-30 NOTE — Telephone Encounter (Signed)
Patient called back and informed that there is a hardship letter from the doctor waiting for his signature and then will be sent to [facility] so they can send him Humalog.

## 2018-01-30 NOTE — Telephone Encounter (Signed)
Patient stated he was returning Lisa's  phone call about his medication.

## 2018-03-10 DIAGNOSIS — N189 Chronic kidney disease, unspecified: Secondary | ICD-10-CM | POA: Diagnosis not present

## 2018-03-10 DIAGNOSIS — N184 Chronic kidney disease, stage 4 (severe): Secondary | ICD-10-CM | POA: Diagnosis not present

## 2018-03-10 DIAGNOSIS — N2581 Secondary hyperparathyroidism of renal origin: Secondary | ICD-10-CM | POA: Diagnosis not present

## 2018-03-20 ENCOUNTER — Encounter: Payer: Self-pay | Admitting: Endocrinology

## 2018-03-20 ENCOUNTER — Ambulatory Visit (INDEPENDENT_AMBULATORY_CARE_PROVIDER_SITE_OTHER): Payer: Medicare Other | Admitting: Endocrinology

## 2018-03-20 VITALS — BP 132/86 | HR 76 | Ht 69.0 in | Wt 170.4 lb

## 2018-03-20 DIAGNOSIS — E1165 Type 2 diabetes mellitus with hyperglycemia: Secondary | ICD-10-CM | POA: Diagnosis not present

## 2018-03-20 DIAGNOSIS — Z794 Long term (current) use of insulin: Secondary | ICD-10-CM | POA: Diagnosis not present

## 2018-03-20 DIAGNOSIS — I2583 Coronary atherosclerosis due to lipid rich plaque: Secondary | ICD-10-CM | POA: Diagnosis not present

## 2018-03-20 DIAGNOSIS — I2510 Atherosclerotic heart disease of native coronary artery without angina pectoris: Secondary | ICD-10-CM | POA: Diagnosis not present

## 2018-03-20 LAB — POCT GLYCOSYLATED HEMOGLOBIN (HGB A1C): Hemoglobin A1C: 6.6 % — AB (ref 4.0–5.6)

## 2018-03-20 NOTE — Patient Instructions (Signed)
HUMALOG: TAKE 9 UNITS BEFORE BREAKFAST
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
## 2018-03-21 DIAGNOSIS — N184 Chronic kidney disease, stage 4 (severe): Secondary | ICD-10-CM | POA: Diagnosis not present

## 2018-03-21 DIAGNOSIS — I129 Hypertensive chronic kidney disease with stage 1 through stage 4 chronic kidney disease, or unspecified chronic kidney disease: Secondary | ICD-10-CM | POA: Diagnosis not present

## 2018-03-21 DIAGNOSIS — D631 Anemia in chronic kidney disease: Secondary | ICD-10-CM | POA: Diagnosis not present

## 2018-03-21 DIAGNOSIS — N2581 Secondary hyperparathyroidism of renal origin: Secondary | ICD-10-CM | POA: Diagnosis not present

## 2018-04-04 DIAGNOSIS — N184 Chronic kidney disease, stage 4 (severe): Secondary | ICD-10-CM | POA: Diagnosis not present

## 2018-04-16 ENCOUNTER — Other Ambulatory Visit: Payer: Self-pay

## 2018-04-16 ENCOUNTER — Telehealth: Payer: Self-pay

## 2018-04-16 MED ORDER — INSULIN LISPRO 100 UNIT/ML (KWIKPEN): PEN_INJECTOR | SUBCUTANEOUS | 3 refills | Status: DC

## 2018-04-16 MED ORDER — BASAGLAR KWIKPEN 100 UNIT/ML ~~LOC~~ SOPN: PEN_INJECTOR | SUBCUTANEOUS | 3 refills | Status: DC

## 2018-04-16 MED ORDER — INSULIN GLARGINE 100 UNIT/ML SOLOSTAR PEN: PEN_INJECTOR | SUBCUTANEOUS | 3 refills | Status: DC

## 2018-04-16 NOTE — Telephone Encounter (Signed)
Pt came into office yesterday and stated that he was in need of more insulin that he typically gets from [facility]. I called the foundation to see if he was still eligible to receive insulin from them and they stated that he did not have an open account as of this time, but he has sent in a newly updated application and it was only lacking the MD license number on page 7 of the application and a copy of the signed Rx from the doctor.  Two copies of page 7 were printed off of the [facility] website and filled out with all the proper information as well as a newly updated Rx for both Novolog and Basaglar (which is what the pt is in need of). Both Rx's and the part of the application that is in need of the MD license number (page 7) were sent to MD for signature and will be faxed to the [employer] once MD gives this paperwork back to me.

## 2018-04-23 ENCOUNTER — Telehealth: Payer: Self-pay | Admitting: Endocrinology

## 2018-04-23 NOTE — Telephone Encounter (Signed)
Received fax from [facility] today and it stated that the pt is enrolled in their program through the end of the year. Pt assistance foundation will be called today to request an expedient shipment of medication so pt does not have a lapse in insulin coverage.

## 2018-04-23 NOTE — Telephone Encounter (Signed)
Mardene Celeste RN with [facility] ph# [phone], ext [phone] re: Patient needs samples (medication at pharmacy is $800-patient cannot afford)-patient almost out of insulin (Humalog & Lantus). Please call patient at ph# [phone] advise

## 2018-04-25 ENCOUNTER — Telehealth: Payer: Self-pay

## 2018-04-25 NOTE — Telephone Encounter (Signed)
Received shipment from [facility] for the patient. Shipment was 3 boxes of Basaglar. Pt was called and notified of this.

## 2018-04-25 NOTE — Telephone Encounter (Signed)
Pt came in today and picked up medication.  Correction must be made to last documentation.  It was not basaglar that was delivered. It was Novolog insulin pens.

## 2018-04-29 ENCOUNTER — Other Ambulatory Visit: Payer: Self-pay | Admitting: Interventional Cardiology

## 2018-05-02 ENCOUNTER — Other Ambulatory Visit: Payer: Self-pay | Admitting: Endocrinology
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
## 2018-05-15 ENCOUNTER — Ambulatory Visit (INDEPENDENT_AMBULATORY_CARE_PROVIDER_SITE_OTHER): Payer: Medicare Other | Admitting: Interventional Cardiology

## 2018-05-15 ENCOUNTER — Encounter: Payer: Self-pay | Admitting: Interventional Cardiology

## 2018-05-15 VITALS — BP 110/62 | HR 78 | Ht 69.0 in | Wt 168.4 lb

## 2018-05-15 DIAGNOSIS — I5022 Chronic systolic (congestive) heart failure: Secondary | ICD-10-CM

## 2018-05-15 DIAGNOSIS — I2510 Atherosclerotic heart disease of native coronary artery without angina pectoris: Secondary | ICD-10-CM

## 2018-05-15 DIAGNOSIS — R5383 Other fatigue: Secondary | ICD-10-CM | POA: Diagnosis not present

## 2018-05-15 DIAGNOSIS — I059 Rheumatic mitral valve disease, unspecified: Secondary | ICD-10-CM

## 2018-05-15 DIAGNOSIS — I4892 Unspecified atrial flutter: Secondary | ICD-10-CM

## 2018-05-15 DIAGNOSIS — I25118 Atherosclerotic heart disease of native coronary artery with other forms of angina pectoris: Secondary | ICD-10-CM | POA: Diagnosis not present

## 2018-05-15 DIAGNOSIS — E7800 Pure hypercholesterolemia, unspecified: Secondary | ICD-10-CM

## 2018-05-15 DIAGNOSIS — I2583 Coronary atherosclerosis due to lipid rich plaque: Secondary | ICD-10-CM

## 2018-05-15 DIAGNOSIS — N184 Chronic kidney disease, stage 4 (severe): Secondary | ICD-10-CM | POA: Diagnosis not present

## 2018-05-15 DIAGNOSIS — Z1329 Encounter for screening for other suspected endocrine disorder: Secondary | ICD-10-CM | POA: Diagnosis not present

## 2018-05-15 LAB — BASIC METABOLIC PANEL
BUN / CREAT RATIO: 14 (ref 10–24)
BUN: 28 mg/dL — AB (ref 8–27)
CO2: 25 mmol/L (ref 20–29)
CREATININE: 2.04 mg/dL — AB (ref 0.76–1.27)
Calcium: 9.7 mg/dL (ref 8.6–10.2)
Chloride: 103 mmol/L (ref 96–106)
GFR calc Af Amer: 33 mL/min/{1.73_m2} — ABNORMAL LOW (ref >59)
GFR, EST NON AFRICAN AMERICAN: 28 mL/min/{1.73_m2} — AB (ref >59)
GLUCOSE: 182 mg/dL — AB (ref 65–99)
Potassium: 4.2 mmol/L (ref 3.5–5.2)
Sodium: 143 mmol/L (ref 134–144)

## 2018-05-15 LAB — TSH: TSH: 3.39 u[IU]/mL (ref 0.450–4.500)

## 2018-05-15 NOTE — Patient Instructions (Addendum)
Medication Instructions:  Your physician recommends that you continue on your current medications as directed. Please refer to the Current Medication list given to you today.   Labwork: TODAY: BMET, TSH  Testing/Procedures: None ordered  Follow-Up: Your physician wants you to follow-up with Dr. [name] on August 25, 2018 at 2:00 PM   Any Other Special Instructions Will Be Listed Below (If Applicable).     If you need a refill on your cardiac medications before your next appointment, please call your pharmacy.

## 2018-06-12 ENCOUNTER — Other Ambulatory Visit: Payer: Self-pay | Admitting: Endocrinology

## 2018-06-24 ENCOUNTER — Ambulatory Visit (INDEPENDENT_AMBULATORY_CARE_PROVIDER_SITE_OTHER): Payer: Medicare Other | Admitting: Endocrinology

## 2018-06-24 ENCOUNTER — Telehealth: Payer: Self-pay

## 2018-06-24 ENCOUNTER — Encounter: Payer: Self-pay | Admitting: Endocrinology

## 2018-06-24 VITALS — BP 122/68 | HR 72 | Ht 71.0 in | Wt 166.0 lb

## 2018-06-24 DIAGNOSIS — E1165 Type 2 diabetes mellitus with hyperglycemia: Secondary | ICD-10-CM | POA: Diagnosis not present

## 2018-06-24 DIAGNOSIS — Z794 Long term (current) use of insulin: Secondary | ICD-10-CM

## 2018-06-24 DIAGNOSIS — I2583 Coronary atherosclerosis due to lipid rich plaque: Secondary | ICD-10-CM

## 2018-06-24 DIAGNOSIS — I2510 Atherosclerotic heart disease of native coronary artery without angina pectoris: Secondary | ICD-10-CM

## 2018-06-24 LAB — COMPREHENSIVE METABOLIC PANEL
ALT: 14 U/L (ref 0–53)
AST: 22 U/L (ref 0–37)
Albumin: 4.5 g/dL (ref 3.5–5.2)
Alkaline Phosphatase: 37 U/L — ABNORMAL LOW (ref 39–117)
BILIRUBIN TOTAL: 1.4 mg/dL — AB (ref 0.2–1.2)
BUN: 26 mg/dL — ABNORMAL HIGH (ref 6–23)
CALCIUM: 10 mg/dL (ref 8.4–10.5)
CO2: 31 meq/L (ref 19–32)
CREATININE: 2.03 mg/dL — AB (ref 0.40–1.50)
Chloride: 105 mEq/L (ref 96–112)
GFR: 33.12 mL/min — AB (ref >60.00)
Glucose, Bld: 40 mg/dL — CL (ref 70–99)
Potassium: 3.5 mEq/L (ref 3.5–5.1)
Sodium: 144 mEq/L (ref 135–145)
TOTAL PROTEIN: 7 g/dL (ref 6.0–8.3)

## 2018-06-24 LAB — POCT GLYCOSYLATED HEMOGLOBIN (HGB A1C): Hemoglobin A1C: 6.8 % — AB (ref 4.0–5.6)
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
## 2018-06-24 NOTE — Telephone Encounter (Signed)
Main lab just called with a critical glucose of 40.

## 2018-06-24 NOTE — Patient Instructions (Signed)
HUMALOG insulin instructions  IMPORTANT: Must take the insulin RIGHT at mealtimes and not after the meal Try to take insulin just before starting to eat  Take 8 units at breakfast, 8 units at lunch and 10 units at suppertime  Must take the Humalog in the morning at the start of the meal even if the blood sugar is below 150  Try to cut back on fried food and a lot of gravy and cheese

## 2018-06-24 NOTE — Telephone Encounter (Signed)
Please remind the patient not to take Humalog before leaving home and only when he is sitting down to eat.  May also need to remind his son to supervise him about his Humalog regimen

## 2018-06-25 LAB — FRUCTOSAMINE: Fructosamine: 335 umol/L — ABNORMAL HIGH (ref 0–285)

## 2018-06-25 NOTE — Telephone Encounter (Signed)
Notified pt with Dr Dwyane Dee instructions.....tried to call son cell number but unable to leave VM--due to not set up.

## 2018-07-04 DIAGNOSIS — I25119 Atherosclerotic heart disease of native coronary artery with unspecified angina pectoris: Secondary | ICD-10-CM | POA: Diagnosis not present

## 2018-07-04 DIAGNOSIS — Z23 Encounter for immunization: Secondary | ICD-10-CM | POA: Diagnosis not present

## 2018-07-04 DIAGNOSIS — Z8546 Personal history of malignant neoplasm of prostate: Secondary | ICD-10-CM | POA: Diagnosis not present

## 2018-07-04 DIAGNOSIS — G4700 Insomnia, unspecified: Secondary | ICD-10-CM | POA: Diagnosis not present

## 2018-07-04 DIAGNOSIS — Z794 Long term (current) use of insulin: Secondary | ICD-10-CM | POA: Diagnosis not present

## 2018-07-04 DIAGNOSIS — N189 Chronic kidney disease, unspecified: Secondary | ICD-10-CM | POA: Diagnosis not present

## 2018-07-04 DIAGNOSIS — I10 Essential (primary) hypertension: Secondary | ICD-10-CM | POA: Diagnosis not present

## 2018-07-04 DIAGNOSIS — I255 Ischemic cardiomyopathy: Secondary | ICD-10-CM | POA: Diagnosis not present

## 2018-07-04 DIAGNOSIS — E1121 Type 2 diabetes mellitus with diabetic nephropathy: Secondary | ICD-10-CM | POA: Diagnosis not present

## 2018-07-04 DIAGNOSIS — I4892 Unspecified atrial flutter: Secondary | ICD-10-CM | POA: Diagnosis not present

## 2018-07-14 DIAGNOSIS — N189 Chronic kidney disease, unspecified: Secondary | ICD-10-CM | POA: Diagnosis not present

## 2018-07-14 DIAGNOSIS — N184 Chronic kidney disease, stage 4 (severe): Secondary | ICD-10-CM | POA: Diagnosis not present

## 2018-07-21 DIAGNOSIS — I129 Hypertensive chronic kidney disease with stage 1 through stage 4 chronic kidney disease, or unspecified chronic kidney disease: Secondary | ICD-10-CM | POA: Diagnosis not present

## 2018-07-21 DIAGNOSIS — N2581 Secondary hyperparathyroidism of renal origin: Secondary | ICD-10-CM | POA: Diagnosis not present

## 2018-07-21 DIAGNOSIS — D631 Anemia in chronic kidney disease: Secondary | ICD-10-CM | POA: Diagnosis not present

## 2018-07-21 DIAGNOSIS — N184 Chronic kidney disease, stage 4 (severe): Secondary | ICD-10-CM | POA: Diagnosis not present

## 2018-07-25 ENCOUNTER — Encounter (INDEPENDENT_AMBULATORY_CARE_PROVIDER_SITE_OTHER): Payer: Medicare Other | Admitting: Ophthalmology

## 2018-07-25 DIAGNOSIS — H35033 Hypertensive retinopathy, bilateral: Secondary | ICD-10-CM | POA: Diagnosis not present

## 2018-07-25 DIAGNOSIS — E11319 Type 2 diabetes mellitus with unspecified diabetic retinopathy without macular edema: Secondary | ICD-10-CM

## 2018-07-25 DIAGNOSIS — H43813 Vitreous degeneration, bilateral: Secondary | ICD-10-CM

## 2018-07-25 DIAGNOSIS — H2513 Age-related nuclear cataract, bilateral: Secondary | ICD-10-CM | POA: Diagnosis not present

## 2018-07-25 DIAGNOSIS — E113391 Type 2 diabetes mellitus with moderate nonproliferative diabetic retinopathy without macular edema, right eye: Secondary | ICD-10-CM | POA: Diagnosis not present

## 2018-07-25 DIAGNOSIS — E113292 Type 2 diabetes mellitus with mild nonproliferative diabetic retinopathy without macular edema, left eye: Secondary | ICD-10-CM

## 2018-07-25 DIAGNOSIS — I10 Essential (primary) hypertension: Secondary | ICD-10-CM | POA: Diagnosis not present

## 2018-07-25 DIAGNOSIS — H353132 Nonexudative age-related macular degeneration, bilateral, intermediate dry stage: Secondary | ICD-10-CM

## 2018-08-01 ENCOUNTER — Encounter (INDEPENDENT_AMBULATORY_CARE_PROVIDER_SITE_OTHER): Payer: Medicare Other | Admitting: Ophthalmology

## 2018-08-07 ENCOUNTER — Telehealth: Payer: Self-pay | Admitting: Endocrinology

## 2018-08-07 NOTE — Telephone Encounter (Signed)
Lilly-sign and faxed documentation today.

## 2018-08-19 ENCOUNTER — Other Ambulatory Visit: Payer: Self-pay | Admitting: Endocrinology

## 2018-08-19 ENCOUNTER — Telehealth: Payer: Self-pay

## 2018-08-19 ENCOUNTER — Other Ambulatory Visit (INDEPENDENT_AMBULATORY_CARE_PROVIDER_SITE_OTHER): Payer: Medicare Other

## 2018-08-19 DIAGNOSIS — E1165 Type 2 diabetes mellitus with hyperglycemia: Secondary | ICD-10-CM | POA: Diagnosis not present

## 2018-08-19 DIAGNOSIS — Z794 Long term (current) use of insulin: Secondary | ICD-10-CM

## 2018-08-19 DIAGNOSIS — E7800 Pure hypercholesterolemia, unspecified: Secondary | ICD-10-CM | POA: Diagnosis not present

## 2018-08-19 LAB — URINALYSIS, ROUTINE W REFLEX MICROSCOPIC
Bilirubin Urine: NEGATIVE
Hgb urine dipstick: NEGATIVE
Ketones, ur: NEGATIVE
Leukocytes, UA: NEGATIVE
Nitrite: NEGATIVE
PH: 5 (ref 5.0–8.0)
RBC / HPF: NONE SEEN (ref 0–?)
SPECIFIC GRAVITY, URINE: 1.01 (ref 1.000–1.030)
TOTAL PROTEIN, URINE-UPE24: NEGATIVE
URINE GLUCOSE: NEGATIVE
Urobilinogen, UA: 0.2 (ref 0.0–1.0)

## 2018-08-19 LAB — MICROALBUMIN / CREATININE URINE RATIO
Creatinine,U: 48.8 mg/dL
MICROALB/CREAT RATIO: 5.3 mg/g (ref 0.0–30.0)
Microalb, Ur: 2.6 mg/dL — ABNORMAL HIGH (ref 0.0–1.9)

## 2018-08-19 LAB — COMPREHENSIVE METABOLIC PANEL
ALBUMIN: 4.3 g/dL (ref 3.5–5.2)
ALK PHOS: 37 U/L — AB (ref 39–117)
ALT: 13 U/L (ref 0–53)
AST: 19 U/L (ref 0–37)
BUN: 31 mg/dL — ABNORMAL HIGH (ref 6–23)
CALCIUM: 9.7 mg/dL (ref 8.4–10.5)
CHLORIDE: 104 meq/L (ref 96–112)
CO2: 28 mEq/L (ref 19–32)
Creatinine, Ser: 2.24 mg/dL — ABNORMAL HIGH (ref 0.40–1.50)
GFR: 29.55 mL/min — AB (ref >60.00)
Glucose, Bld: 209 mg/dL — ABNORMAL HIGH (ref 70–99)
POTASSIUM: 4.7 meq/L (ref 3.5–5.1)
Sodium: 139 mEq/L (ref 135–145)
TOTAL PROTEIN: 6.6 g/dL (ref 6.0–8.3)
Total Bilirubin: 1.3 mg/dL — ABNORMAL HIGH (ref 0.2–1.2)

## 2018-08-19 LAB — LIPID PANEL
CHOLESTEROL: 111 mg/dL (ref 0–200)
HDL: 57.9 mg/dL (ref >39.00)
LDL CALC: 43 mg/dL (ref 0–99)
NonHDL: 52.81
TRIGLYCERIDES: 50 mg/dL (ref 0.0–149.0)
Total CHOL/HDL Ratio: 2
VLDL: 10 mg/dL (ref 0.0–40.0)

## 2018-08-19 NOTE — Telephone Encounter (Signed)
PT came in and requested his insulin that was delivered from [facility] Patient assistance foundation. Pt was given two boxes of Basaglar with a sticker that had his named on it. Each box had 5 insulin pens in it.

## 2018-08-20 LAB — FRUCTOSAMINE: FRUCTOSAMINE: 308 umol/L — AB (ref 0–285)
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
## 2018-08-25 ENCOUNTER — Ambulatory Visit (INDEPENDENT_AMBULATORY_CARE_PROVIDER_SITE_OTHER): Payer: Medicare Other | Admitting: Interventional Cardiology

## 2018-08-25 ENCOUNTER — Encounter: Payer: Self-pay | Admitting: Interventional Cardiology

## 2018-08-25 VITALS — BP 124/60 | HR 67 | Ht 71.0 in | Wt 168.8 lb

## 2018-08-25 DIAGNOSIS — I5022 Chronic systolic (congestive) heart failure: Secondary | ICD-10-CM | POA: Diagnosis not present

## 2018-08-25 DIAGNOSIS — I2510 Atherosclerotic heart disease of native coronary artery without angina pectoris: Secondary | ICD-10-CM | POA: Diagnosis not present

## 2018-08-25 DIAGNOSIS — I252 Old myocardial infarction: Secondary | ICD-10-CM

## 2018-08-25 DIAGNOSIS — I059 Rheumatic mitral valve disease, unspecified: Secondary | ICD-10-CM | POA: Diagnosis not present

## 2018-08-25 DIAGNOSIS — N184 Chronic kidney disease, stage 4 (severe): Secondary | ICD-10-CM

## 2018-08-25 DIAGNOSIS — I25118 Atherosclerotic heart disease of native coronary artery with other forms of angina pectoris: Secondary | ICD-10-CM

## 2018-08-25 DIAGNOSIS — I2583 Coronary atherosclerosis due to lipid rich plaque: Secondary | ICD-10-CM

## 2018-08-25 DIAGNOSIS — I4892 Unspecified atrial flutter: Secondary | ICD-10-CM

## 2018-08-25 NOTE — Patient Instructions (Signed)

## 2018-08-28 ENCOUNTER — Other Ambulatory Visit: Payer: Self-pay

## 2018-08-28 MED ORDER — INSULIN LISPRO (1 UNIT DIAL) 100 UNIT/ML (KWIKPEN): PEN_INJECTOR | SUBCUTANEOUS | 3 refills | Status: DC

## 2018-09-22 ENCOUNTER — Other Ambulatory Visit: Payer: Self-pay | Admitting: Endocrinology

## 2018-09-22 DIAGNOSIS — E1165 Type 2 diabetes mellitus with hyperglycemia: Secondary | ICD-10-CM

## 2018-09-22 DIAGNOSIS — Z794 Long term (current) use of insulin: Principal | ICD-10-CM

## 2018-09-23 ENCOUNTER — Other Ambulatory Visit (INDEPENDENT_AMBULATORY_CARE_PROVIDER_SITE_OTHER): Payer: Medicare Other

## 2018-09-23 DIAGNOSIS — Z794 Long term (current) use of insulin: Secondary | ICD-10-CM

## 2018-09-23 DIAGNOSIS — E1165 Type 2 diabetes mellitus with hyperglycemia: Secondary | ICD-10-CM | POA: Diagnosis not present

## 2018-09-23 LAB — HEMOGLOBIN A1C: HEMOGLOBIN A1C: 6.6 % — AB (ref 4.6–6.5)

## 2018-09-23 LAB — GLUCOSE, RANDOM: Glucose, Bld: 137 mg/dL — ABNORMAL HIGH (ref 70–99)

## 2018-09-24 LAB — FRUCTOSAMINE: FRUCTOSAMINE: 320 umol/L — AB (ref 0–285)

## 2018-09-25 ENCOUNTER — Encounter: Payer: Self-pay | Admitting: Endocrinology

## 2018-09-25 ENCOUNTER — Ambulatory Visit (INDEPENDENT_AMBULATORY_CARE_PROVIDER_SITE_OTHER): Payer: Medicare Other | Admitting: Endocrinology

## 2018-09-25 VITALS — BP 108/60 | HR 86 | Ht 71.0 in | Wt 164.4 lb

## 2018-09-25 DIAGNOSIS — Z794 Long term (current) use of insulin: Secondary | ICD-10-CM

## 2018-09-25 DIAGNOSIS — E1165 Type 2 diabetes mellitus with hyperglycemia: Secondary | ICD-10-CM | POA: Diagnosis not present

## 2018-09-25 NOTE — Patient Instructions (Addendum)
HUMALOG 6-7 AT LUNCH ONLY AND NO OTHER CHANGES, KEEP 9 UNITS AT bFST AND SUPPER  CHECK SUGAR AT BEDTIME DAILY
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
## 2018-09-28 ENCOUNTER — Other Ambulatory Visit: Payer: Self-pay | Admitting: Interventional Cardiology

## 2018-10-03 ENCOUNTER — Encounter (INDEPENDENT_AMBULATORY_CARE_PROVIDER_SITE_OTHER): Payer: Medicare Other | Admitting: Ophthalmology

## 2018-10-15 ENCOUNTER — Telehealth: Payer: Self-pay | Admitting: Interventional Cardiology

## 2018-10-15 NOTE — Telephone Encounter (Signed)
New message     Pt stated that he has not been taking  ranitidine (ZANTAC) 150 MG tablet and wants to know if that ok and if its the reason why hes feeling the way he's been feeling . I tried to get him an appt with pa asap but pt wants to see varanasi and nothing avail. Pt didn't say much. Please follow up

## 2018-10-15 NOTE — Telephone Encounter (Signed)
Left message for patient to call back  

## 2018-10-15 NOTE — Telephone Encounter (Signed)
Follow up  ° ° °Patient is returning your call. °

## 2018-10-16 NOTE — Telephone Encounter (Signed)
Called and left message for patient to call back.

## 2018-10-23 NOTE — Telephone Encounter (Signed)
Attempted to contact patient, but the phone kept ringing and cut off. Will try again at another time.

## 2018-10-27 NOTE — Telephone Encounter (Signed)
Left message for patient to call back  

## 2018-10-29 NOTE — Telephone Encounter (Signed)
Called patient again to follow up on message from the other day. Patient states that he does not have any concerns at this time.

## 2018-11-24 ENCOUNTER — Ambulatory Visit (INDEPENDENT_AMBULATORY_CARE_PROVIDER_SITE_OTHER): Payer: Medicare Other | Admitting: Endocrinology

## 2018-11-24 ENCOUNTER — Other Ambulatory Visit: Payer: Self-pay

## 2018-11-24 ENCOUNTER — Encounter: Payer: Self-pay | Admitting: Endocrinology

## 2018-11-24 VITALS — BP 132/60 | HR 75 | Ht 71.0 in | Wt 168.4 lb

## 2018-11-24 DIAGNOSIS — E1165 Type 2 diabetes mellitus with hyperglycemia: Secondary | ICD-10-CM

## 2018-11-24 DIAGNOSIS — Z794 Long term (current) use of insulin: Secondary | ICD-10-CM

## 2018-11-24 MED ORDER — GLUCOSE BLOOD VI STRP: ORAL_STRIP | 5 refills | Status: DC

## 2018-11-24 NOTE — Patient Instructions (Signed)
Go up or down 1-2 Humalog for larger or smaller meals
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
## 2018-12-30 ENCOUNTER — Other Ambulatory Visit: Payer: Self-pay

## 2018-12-30 MED ORDER — GLUCOSE BLOOD VI STRP: ORAL_STRIP | 5 refills | Status: AC

## 2019-02-04 ENCOUNTER — Telehealth: Payer: Self-pay

## 2019-02-04 ENCOUNTER — Other Ambulatory Visit: Payer: Self-pay

## 2019-02-04 MED ORDER — BASAGLAR KWIKPEN 100 UNIT/ML ~~LOC~~ SOPN: PEN_INJECTOR | SUBCUTANEOUS | 3 refills | Status: DC

## 2019-02-04 MED ORDER — INSULIN LISPRO (1 UNIT DIAL) 100 UNIT/ML (KWIKPEN): PEN_INJECTOR | SUBCUTANEOUS | 3 refills | Status: DC

## 2019-02-04 NOTE — Telephone Encounter (Signed)
Spoke with Robert [name] and he stated the blood sugars seem to be dropping in the afternoon-I gave MD advice for him to reduce lunchtime Humalog by 2 units and he agreed to do this- patient is already checking blood sugar 4 to 5 times daily and would like his test strip prescription to reflect that so that he gets enough test strips each month-patient declined virtual visit- I gave Robert [name] the number to [employer] so that he can get patient set up for automatic refills and made him aware that he will need a new application on his next visit-I advised patient to call social security to get them to give reward letter for the patient assistance application but was told he will bring in his tax information

## 2019-02-04 NOTE — Telephone Encounter (Signed)
[facility] and they stated that the last time the patient has received a shipment of insulin was in December of 2019. The patient is still currently enrolled and will only require a new application on July 94,8347 for Humalog and on August 8,2020 for [place].  Pt is not currently set up for automatic refills and this is possibly why he is running out before receiving another shipment. Representative stated that the patient himself has to call and request to be placed on automatic shipping. In order for pt to continue receiving insulin in the meantime, [employer] is in need of a new prescription for both medications and they need it faxed to [facility] at [phone]. Rx has been printed for MD signature.

## 2019-02-04 NOTE — Telephone Encounter (Signed)
Could you please schedule this pt? Dr. Dwyane Dee would like to see him before June 2.

## 2019-02-04 NOTE — Telephone Encounter (Signed)
Patients son [name] called today to express concern about his dads blood sugar dropping low in his sleep-he stated that his dads blood sugar after a small candy bar and a packet of sugar was 65 when he woke up from a nap yesterday afternoon. [name] wanted to know if the freestyle Elenor Legato would work for his dad so that it would be easier to keep up with his blood sugar without pricking finger so often and wanted to know if there is any insulin pump would be a good idea for his dad-he is requesting one that will alert his dad when his blood sugar is low- [name] also stated he will be coming to his dads appointments from now on because he thinks he is not getting all of the information about the visit accurate- [name] also asked if their is any Humalog and Basaglar here for his dad from patient assistance-I did look in the fridge and did not see any-please advise

## 2019-02-04 NOTE — Telephone Encounter (Signed)
Message did not indicate if he has had low sugars during the night If he is having low sugars only in the afternoon he needs to reduce his lunchtime Humalog by 2 units.  He has been told that if he documents checking 4 times a day the freestyle Elenor Legato will be covered by Medicare otherwise he will need to buy it himself. He is already on a patient assistance program for insulin If needed we can set up a virtual visit with him and his son but will need blood sugar data

## 2019-02-18 DIAGNOSIS — I10 Essential (primary) hypertension: Secondary | ICD-10-CM | POA: Diagnosis not present

## 2019-02-18 DIAGNOSIS — N189 Chronic kidney disease, unspecified: Secondary | ICD-10-CM | POA: Diagnosis not present

## 2019-02-18 DIAGNOSIS — I255 Ischemic cardiomyopathy: Secondary | ICD-10-CM | POA: Diagnosis not present

## 2019-02-18 DIAGNOSIS — E1121 Type 2 diabetes mellitus with diabetic nephropathy: Secondary | ICD-10-CM | POA: Diagnosis not present

## 2019-02-18 DIAGNOSIS — I4892 Unspecified atrial flutter: Secondary | ICD-10-CM | POA: Diagnosis not present

## 2019-02-18 DIAGNOSIS — I25119 Atherosclerotic heart disease of native coronary artery with unspecified angina pectoris: Secondary | ICD-10-CM | POA: Diagnosis not present

## 2019-02-18 DIAGNOSIS — Z8546 Personal history of malignant neoplasm of prostate: Secondary | ICD-10-CM | POA: Diagnosis not present

## 2019-02-18 DIAGNOSIS — G4700 Insomnia, unspecified: Secondary | ICD-10-CM | POA: Diagnosis not present

## 2019-02-19 ENCOUNTER — Telehealth: Payer: Self-pay

## 2019-02-19 NOTE — Telephone Encounter (Signed)
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
## 2019-02-23 ENCOUNTER — Telehealth: Payer: Self-pay

## 2019-02-23 ENCOUNTER — Other Ambulatory Visit: Payer: Self-pay

## 2019-02-23 ENCOUNTER — Telehealth (INDEPENDENT_AMBULATORY_CARE_PROVIDER_SITE_OTHER): Payer: Medicare Other | Admitting: Interventional Cardiology

## 2019-02-23 ENCOUNTER — Encounter: Payer: Self-pay | Admitting: Interventional Cardiology

## 2019-02-23 DIAGNOSIS — I2583 Coronary atherosclerosis due to lipid rich plaque: Secondary | ICD-10-CM

## 2019-02-23 DIAGNOSIS — E7800 Pure hypercholesterolemia, unspecified: Secondary | ICD-10-CM

## 2019-02-23 DIAGNOSIS — I2510 Atherosclerotic heart disease of native coronary artery without angina pectoris: Secondary | ICD-10-CM

## 2019-02-23 DIAGNOSIS — I255 Ischemic cardiomyopathy: Secondary | ICD-10-CM | POA: Diagnosis not present

## 2019-02-23 DIAGNOSIS — N184 Chronic kidney disease, stage 4 (severe): Secondary | ICD-10-CM

## 2019-02-23 DIAGNOSIS — I059 Rheumatic mitral valve disease, unspecified: Secondary | ICD-10-CM

## 2019-02-23 DIAGNOSIS — I5022 Chronic systolic (congestive) heart failure: Secondary | ICD-10-CM | POA: Diagnosis not present

## 2019-02-23 NOTE — Telephone Encounter (Signed)
Spoke with representative named Darrol Angel in regards to the pt's insulin. Darrol Angel stated that according to the documentation she has on her end, the last representative I spoke with did not make any documentation of the last conversation had in regards to this situation.  Darrol Angel now states that what I was previously told, which is [employer] needs a new prescription signed by the MD is not accurate. She states that they need the official [employer] Form which I have printed off from their website. This will be filled out and faxed immediately.

## 2019-02-23 NOTE — Telephone Encounter (Signed)
Patient called today to find out if his Humalg and Basaglar have come in from [employer]- I checked the fridge and did not see either one of the medications for him- patient was very upset and ended up giving the phone to his son Robert [name]) to explain his frustration- the note in the chart on 02/04/19 was a request to schedule patient to come in sooner than 02/24/19 but the patient stated he was never called for this- Robert [name] also stated he called [employer] to get his dad set up for automatic refills and he has still not received any medication- Robert [name] will be coming to the office today to get a new application for [employer] and to drop off tax information-please advise if it is ok to give samples of Humalog and Basaglar until we can get this taken care of patient is almost out of medication

## 2019-02-23 NOTE — Telephone Encounter (Signed)
Currently on hold with [employer] patient assistance foundation regarding this issue. Per my note on 02/04/2019, [employer] was in need of an updated Rx, and the pt was still enrolled in the assistance program until July and August of 2020. A new Rx was faxed to [facility] and confirmation was received for this fax on 02/04/2019 at 3:17pm. When I speak with a representative, I will inquire as to why the pt has not received a shipment yet. In addition, I will inquire as to whether the shipment will be sent to this office, or the pt's home, per the son's request.

## 2019-02-23 NOTE — Patient Instructions (Signed)

## 2019-02-24 ENCOUNTER — Encounter: Payer: Self-pay | Admitting: Endocrinology

## 2019-02-24 ENCOUNTER — Other Ambulatory Visit: Payer: Self-pay

## 2019-02-24 ENCOUNTER — Ambulatory Visit (INDEPENDENT_AMBULATORY_CARE_PROVIDER_SITE_OTHER): Payer: Medicare Other | Admitting: Endocrinology

## 2019-02-24 DIAGNOSIS — E1121 Type 2 diabetes mellitus with diabetic nephropathy: Secondary | ICD-10-CM

## 2019-02-24 DIAGNOSIS — E1165 Type 2 diabetes mellitus with hyperglycemia: Secondary | ICD-10-CM

## 2019-02-24 DIAGNOSIS — IMO0002 Reserved for concepts with insufficient information to code with codable children: Secondary | ICD-10-CM

## 2019-02-24 DIAGNOSIS — Z794 Long term (current) use of insulin: Secondary | ICD-10-CM
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
## 2019-03-09 ENCOUNTER — Telehealth: Payer: Self-pay | Admitting: Endocrinology

## 2019-03-09 DIAGNOSIS — N184 Chronic kidney disease, stage 4 (severe): Secondary | ICD-10-CM | POA: Diagnosis not present

## 2019-03-09 NOTE — Telephone Encounter (Signed)
Called pt's son back. Son had requested pt's new medicare ID number because the pt cannot find his new medicare card, which he needs to set up an account with [employer].

## 2019-03-09 NOTE — Telephone Encounter (Signed)
Patient called in regards to [facility] and requested a call back asap  Please Advise, Thanks

## 2019-03-11 IMAGING — XA IR CHOLECYSTOSTOMY
2 series · 4 of 4 positions shown · non-contrast
Comparison: none

INDICATION: Acute cholecystitis.  Sepsis.

[Series 1: fl (-) angio · 1 of 1 slices shown (1 of 2)]
[im 1/1]
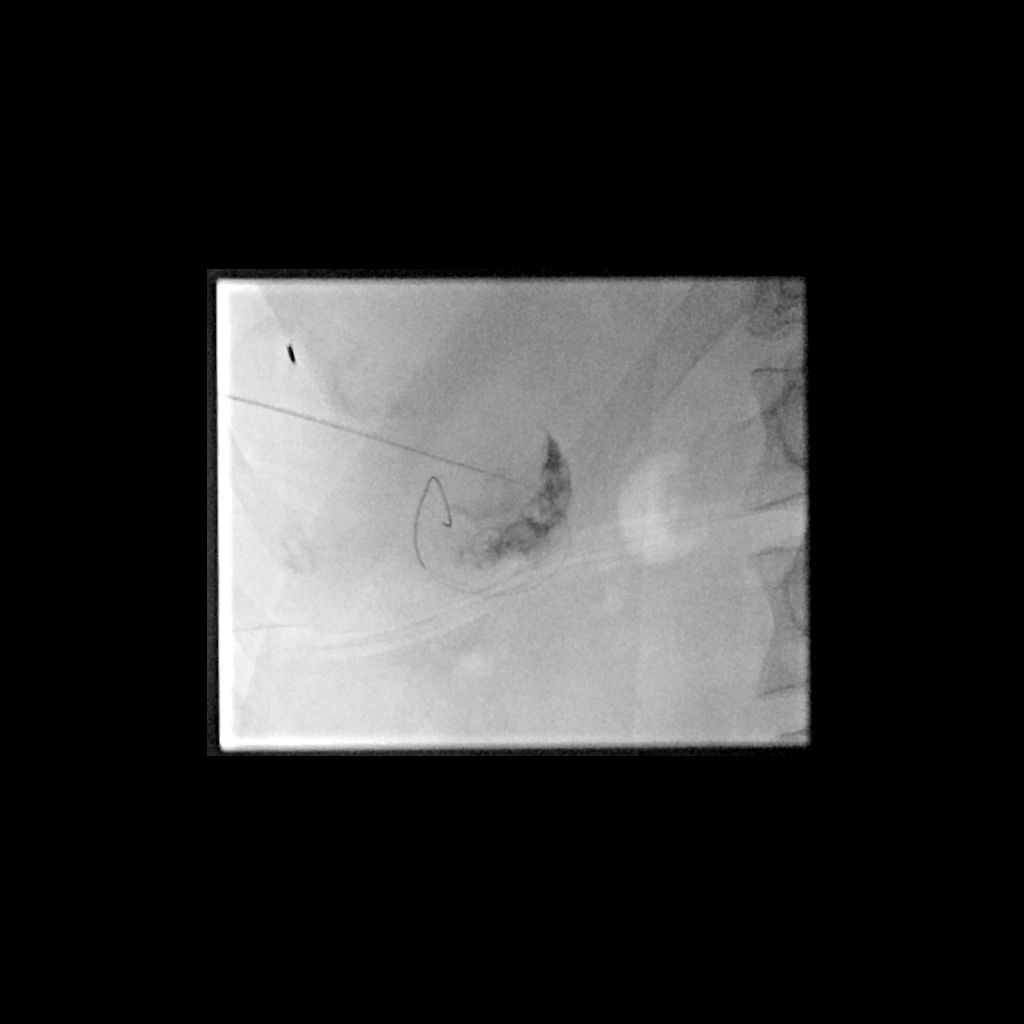

[Series 2: fl (-) angio · 3 of 3 slices shown (2 of 2)]
[im 1/3]
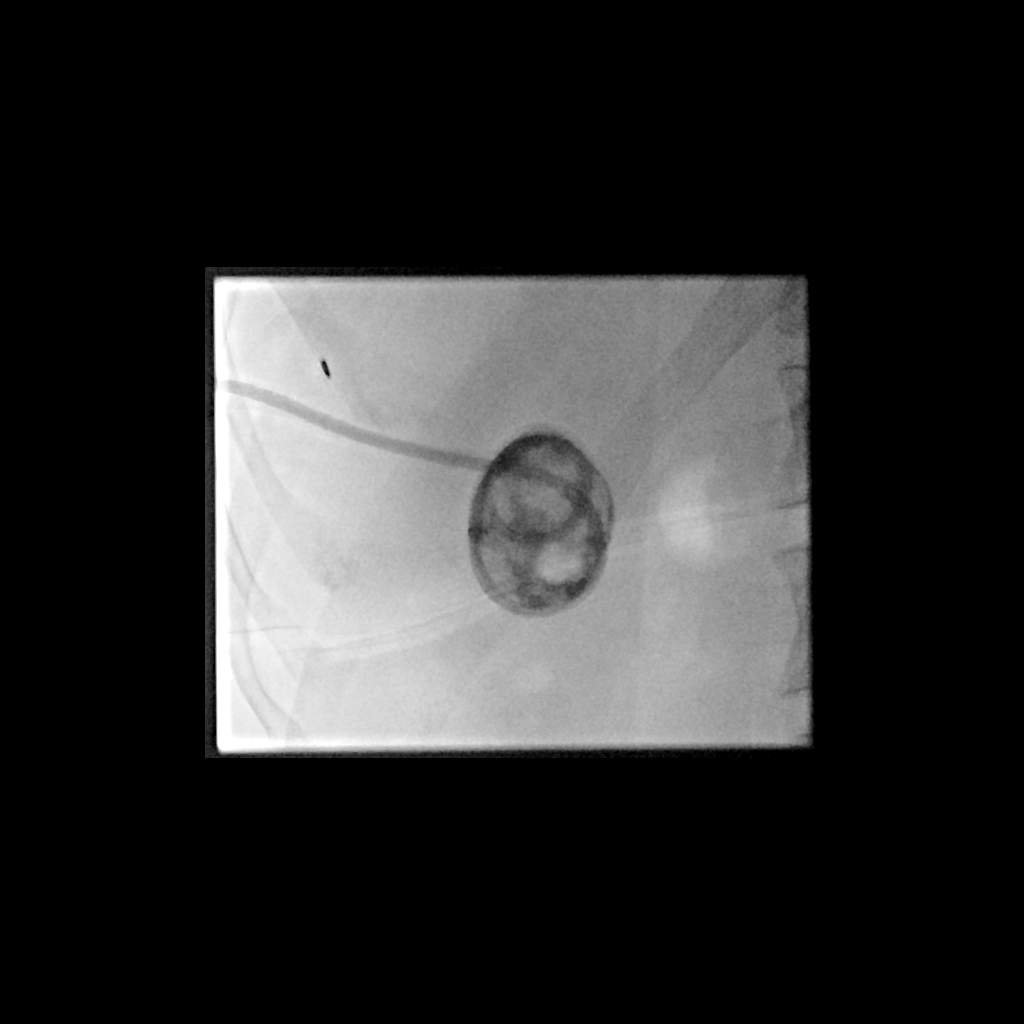
[im 2/3]
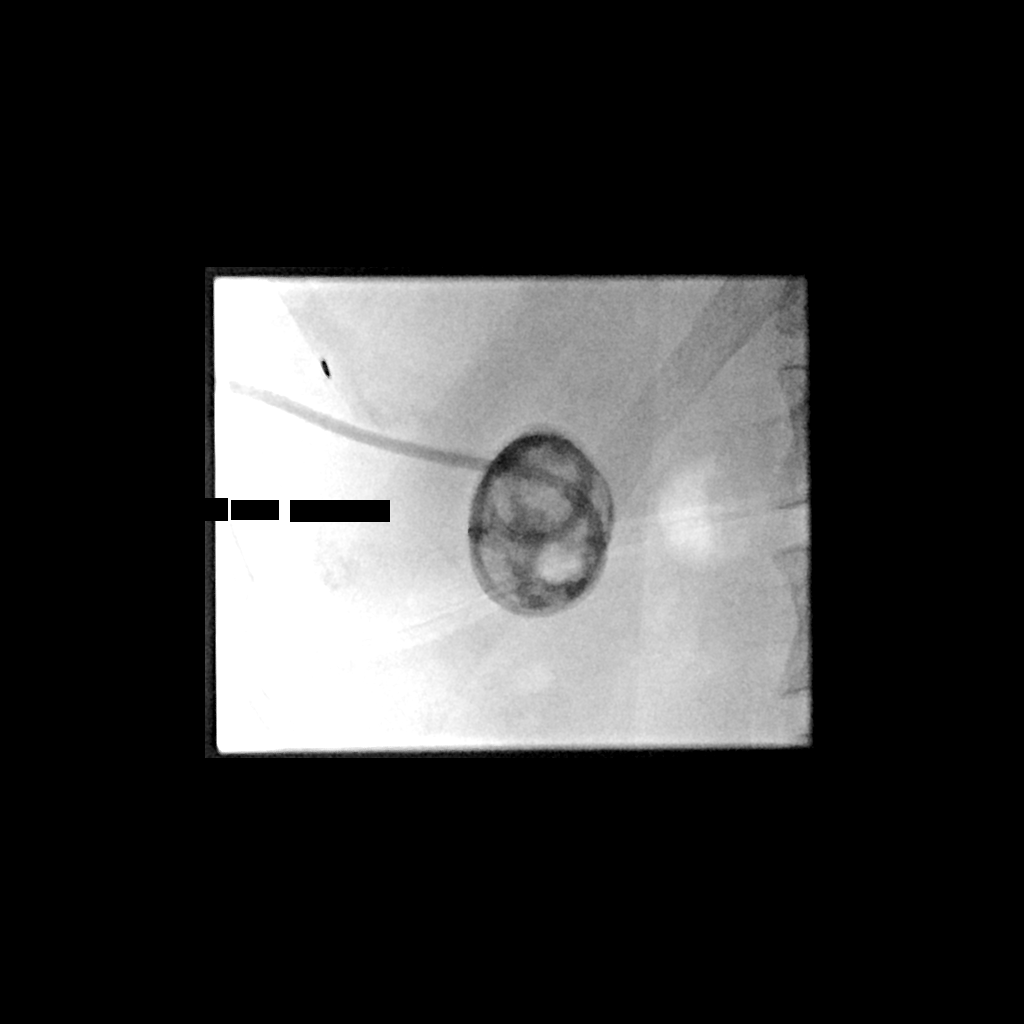
[im 3/3]
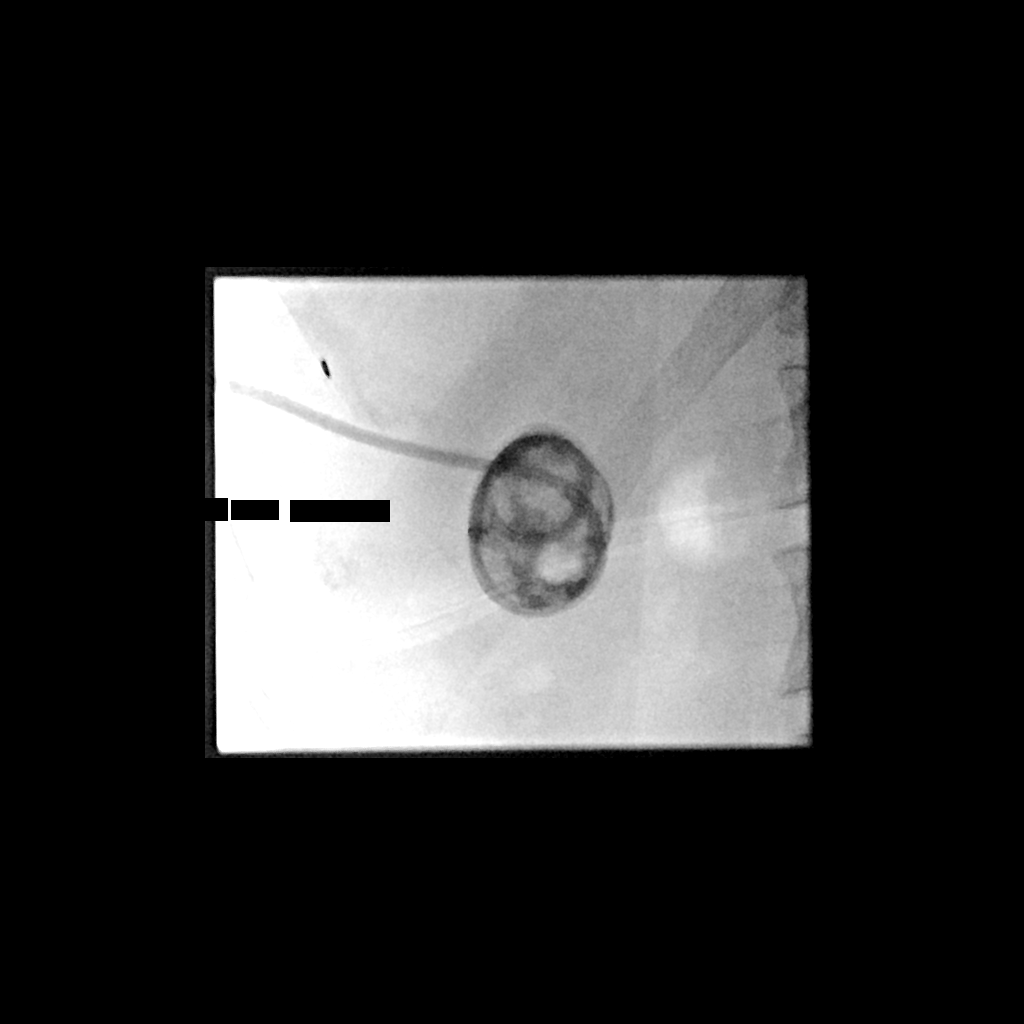

[4 of 4 positions shown; findings below may reference images not displayed]

EXAM:
CHOLECYSTOSTOMY

MEDICATIONS:
None

ANESTHESIA/SEDATION:
Fentanyl 25 mcg IV; Versed 1 mg IV

Moderate Sedation Time:  10

The patient was continuously monitored during the procedure by the
interventional radiology nurse under my direct supervision.

FLUOROSCOPY TIME:  Fluoroscopy Time:  minutes 36 seconds (3 mGy).

COMPLICATIONS:
None immediate.

PROCEDURE:
Informed written consent was obtained from the patient after a
thorough discussion of the procedural risks, benefits and
alternatives. All questions were addressed. Maximal Sterile Barrier
Technique was utilized including caps, mask, sterile gowns, sterile
gloves, sterile drape, hand hygiene and skin antiseptic. A timeout
was performed prior to the initiation of the procedure.

The right upper quadrant was prepped with Betadine in a sterile
fashion, and a sterile drape was applied covering the operative
field. A sterile gown and sterile gloves were used for the
procedure.

A 21 gauge needle was inserted into the gallbladder lumen under
sonographic guidance and via transhepatic approach. It was removed
over a 018 wire, which was upsized to a 3-J. A 10-French drain was
advanced over the wire and coiled in the gallbladder lumen. It was
sewn to the skin after being string fixed. Contrast was injected.
FINDINGS: The cystic duct is occluded. Final imaging demonstrates placement of
a 10 French drain coiled in the lumen of the gallbladder.
IMPRESSION: Successful cholecystostomy. This needs to remain in place at least 6
weeks.

## 2019-03-16 DIAGNOSIS — D631 Anemia in chronic kidney disease: Secondary | ICD-10-CM | POA: Diagnosis not present

## 2019-03-16 DIAGNOSIS — N2581 Secondary hyperparathyroidism of renal origin: Secondary | ICD-10-CM | POA: Diagnosis not present

## 2019-03-16 DIAGNOSIS — I129 Hypertensive chronic kidney disease with stage 1 through stage 4 chronic kidney disease, or unspecified chronic kidney disease: Secondary | ICD-10-CM | POA: Diagnosis not present

## 2019-03-16 DIAGNOSIS — N184 Chronic kidney disease, stage 4 (severe): Secondary | ICD-10-CM | POA: Diagnosis not present

## 2019-03-25 ENCOUNTER — Other Ambulatory Visit: Payer: Self-pay

## 2019-03-25 ENCOUNTER — Telehealth: Payer: Self-pay | Admitting: Endocrinology

## 2019-03-25 MED ORDER — INSULIN LISPRO (1 UNIT DIAL) 100 UNIT/ML (KWIKPEN): PEN_INJECTOR | SUBCUTANEOUS | 4 refills | Status: AC

## 2019-03-25 NOTE — Telephone Encounter (Signed)
Pt has an account with [employer] now please call in freestyle [place]

## 2019-03-25 NOTE — Telephone Encounter (Signed)
[place] to request a detailed written order to be faxed to this office in order to fill out and send back so pt can get his supplies shipped. They stated that this would be done and they also needed the last office clinical notes sent back with this written order.

## 2019-04-02 ENCOUNTER — Other Ambulatory Visit: Payer: Self-pay

## 2019-04-02 MED ORDER — BASAGLAR KWIKPEN 100 UNIT/ML ~~LOC~~ SOPN: PEN_INJECTOR | SUBCUTANEOUS | 3 refills | Status: DC

## 2019-04-09 ENCOUNTER — Other Ambulatory Visit: Payer: Self-pay

## 2019-04-09 MED ORDER — BASAGLAR KWIKPEN 100 UNIT/ML ~~LOC~~ SOPN: PEN_INJECTOR | SUBCUTANEOUS | 3 refills | Status: AC

## 2019-04-09 NOTE — Telephone Encounter (Signed)
Patient's Son is calling to follow up on [place] order. States that he reached out to [employer] and they stated that they still have not gotten the order and clinical notes. Requesting this be taken care of ASAP.   Pt still having low BS levels - 60s range.

## 2019-04-10 NOTE — Telephone Encounter (Signed)
All doucments were faxed to [facility] on 03/30/2019. Resent on 04/09/2019 with note to staff members that this was the second fax and to please call or send a fax back confirming that it was received.

## 2019-04-22 NOTE — Telephone Encounter (Signed)
Last chart notes have been faxed again. Waiting on fax confirmation.

## 2019-04-22 NOTE — Telephone Encounter (Signed)
These documents have been faxed three times this month with fax confirmations received each time. Each time I fax the documents, [facility] calls at a later date and states that they never received them. Will fax again.

## 2019-04-22 NOTE — Telephone Encounter (Signed)
[employer] called stating that they still have not received clinic notes. Informed info below and stated they have not received them.   FAX # [phone] [facility] # [phone] declined ext ACCt. # [phone]  Please Advise, Thanks

## 2019-04-23 NOTE — Telephone Encounter (Signed)
This paperwork was faxed multiple times yesterday and 2 fax confirmation were received as successfully going through.

## 2019-04-27 ENCOUNTER — Telehealth: Payer: Self-pay | Admitting: Endocrinology

## 2019-04-27 NOTE — Telephone Encounter (Signed)
Per Patients son states that [place] sent paperwork back on 04/24/19 for just a bit of correction,  He is calling to be sure that this paper work has been completed and that it has been returned from the fax 04/24/2019

## 2019-04-28 ENCOUNTER — Ambulatory Visit (INDEPENDENT_AMBULATORY_CARE_PROVIDER_SITE_OTHER): Payer: Medicare Other | Admitting: Endocrinology

## 2019-04-28 ENCOUNTER — Other Ambulatory Visit: Payer: Self-pay

## 2019-04-28 ENCOUNTER — Encounter: Payer: Self-pay | Admitting: Endocrinology

## 2019-04-28 VITALS — BP 120/70 | HR 87 | Ht 71.0 in | Wt 156.2 lb

## 2019-04-28 DIAGNOSIS — E1165 Type 2 diabetes mellitus with hyperglycemia: Secondary | ICD-10-CM

## 2019-04-28 DIAGNOSIS — I255 Ischemic cardiomyopathy: Secondary | ICD-10-CM

## 2019-04-28 DIAGNOSIS — Z794 Long term (current) use of insulin: Secondary | ICD-10-CM

## 2019-04-28 LAB — COMPREHENSIVE METABOLIC PANEL
ALT: 14 U/L (ref 0–53)
AST: 20 U/L (ref 0–37)
Albumin: 4.5 g/dL (ref 3.5–5.2)
Alkaline Phosphatase: 36 U/L — ABNORMAL LOW (ref 39–117)
BUN: 30 mg/dL — ABNORMAL HIGH (ref 6–23)
CO2: 30 mEq/L (ref 19–32)
Calcium: 10.3 mg/dL (ref 8.4–10.5)
Chloride: 98 mEq/L (ref 96–112)
Creatinine, Ser: 2.22 mg/dL — ABNORMAL HIGH (ref 0.40–1.50)
GFR: 28.05 mL/min — ABNORMAL LOW (ref >60.00)
Glucose, Bld: 263 mg/dL — ABNORMAL HIGH (ref 70–99)
Potassium: 4.2 mEq/L (ref 3.5–5.1)
Sodium: 136 mEq/L (ref 135–145)
Total Bilirubin: 1.1 mg/dL (ref 0.2–1.2)
Total Protein: 6.8 g/dL (ref 6.0–8.3)

## 2019-04-28 LAB — LIPID PANEL
Cholesterol: 128 mg/dL (ref 0–200)
HDL: 68.2 mg/dL (ref >39.00)
LDL Cholesterol: 49 mg/dL (ref 0–99)
NonHDL: 60.08
Total CHOL/HDL Ratio: 2
Triglycerides: 55 mg/dL (ref 0.0–149.0)
VLDL: 11 mg/dL (ref 0.0–40.0)

## 2019-04-28 LAB — POCT GLYCOSYLATED HEMOGLOBIN (HGB A1C): Hemoglobin A1C: 6.3 % — AB (ref 4.0–5.6)
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
## 2019-04-28 NOTE — Telephone Encounter (Signed)
Please call the patient and his son needs to write down the following instructions for him Humalog at breakfast: He will take 8 units if eating oatmeal and 7 units if eating any eggs or meat At lunchtime and dinnertime he will take 8 units for regular meals and 9 units if eating any fried food or beef. Also if blood sugar is over 250 at lunch and dinner he will take an additional 1 unit. If he is able to follow these instructions he will be able to get the sensor He can have a telephone visit in about a month to document these changes

## 2019-04-28 NOTE — Patient Instructions (Signed)
Add almonds to oatmeal  7 HUMALOG AT [facility] NOW

## 2019-04-28 NOTE — Telephone Encounter (Signed)
Called [facility] and spoke with Diondra, the team lead for the clinical documentation department and she stated that the reason the clinical chart notes were denied by medicare is because the MD stated that the patient is not adjusting insulin based of what he is eating, and injects the same amount of insulin regardless of what he is eating.  In order for the patient to get the [facility], the clinical documentation must stated that  1)the patient is currently taking "X" amount of insulin at each meal and he does adjust the dose himself if needed. If that is not stated,  2)It needs to state that the patient does adjust insulin based off of blood sugar readings, like a insulin/carb ratio.  Whatever is states, it HAS to state in the current regimen, that the patient adjusts insulin doses, in some form or fashion. This cannot be put in the plan of treatment. It must show that the patient actively does this.

## 2019-04-29 ENCOUNTER — Ambulatory Visit: Payer: Medicare Other | Admitting: Endocrinology

## 2019-04-29 NOTE — Telephone Encounter (Signed)
Attempted to call pt's son again and line was still busy.

## 2019-04-29 NOTE — Telephone Encounter (Signed)
Attempted to call pt's son again and the line was busy. Will attempt to call again.

## 2019-04-29 NOTE — Telephone Encounter (Signed)
Attempted to call pt's son and he did not answer and does not have a voicemail box that has been set up yet.

## 2019-04-30 NOTE — Telephone Encounter (Signed)
Send letter

## 2019-04-30 NOTE — Telephone Encounter (Signed)
Letter printed for MD to sign.

## 2019-04-30 NOTE — Telephone Encounter (Signed)
Attempted to call home line twice today already and line keeps saying that it is busy. Pt's son's cell phone number has no voicemail box set up, and he did not answer, and son's work number is no longer valid and he has retired from this place of work. How would you like to proceed?

## 2019-06-12 DIAGNOSIS — Z23 Encounter for immunization: Secondary | ICD-10-CM | POA: Diagnosis not present

## 2019-06-24 DIAGNOSIS — I25119 Atherosclerotic heart disease of native coronary artery with unspecified angina pectoris: Secondary | ICD-10-CM | POA: Diagnosis not present

## 2019-06-24 DIAGNOSIS — I10 Essential (primary) hypertension: Secondary | ICD-10-CM | POA: Diagnosis not present

## 2019-06-24 DIAGNOSIS — E1121 Type 2 diabetes mellitus with diabetic nephropathy: Secondary | ICD-10-CM | POA: Diagnosis not present

## 2019-06-24 DIAGNOSIS — I252 Old myocardial infarction: Secondary | ICD-10-CM | POA: Diagnosis not present

## 2019-06-24 DIAGNOSIS — I255 Ischemic cardiomyopathy: Secondary | ICD-10-CM | POA: Diagnosis not present

## 2019-06-24 DIAGNOSIS — Z8546 Personal history of malignant neoplasm of prostate: Secondary | ICD-10-CM | POA: Diagnosis not present

## 2019-06-24 DIAGNOSIS — N189 Chronic kidney disease, unspecified: Secondary | ICD-10-CM | POA: Diagnosis not present

## 2019-07-10 DIAGNOSIS — Z8546 Personal history of malignant neoplasm of prostate: Secondary | ICD-10-CM | POA: Diagnosis not present

## 2019-07-10 DIAGNOSIS — I255 Ischemic cardiomyopathy: Secondary | ICD-10-CM | POA: Diagnosis not present

## 2019-07-10 DIAGNOSIS — I25119 Atherosclerotic heart disease of native coronary artery with unspecified angina pectoris: Secondary | ICD-10-CM | POA: Diagnosis not present

## 2019-07-10 DIAGNOSIS — N189 Chronic kidney disease, unspecified: Secondary | ICD-10-CM | POA: Diagnosis not present

## 2019-07-10 DIAGNOSIS — E1121 Type 2 diabetes mellitus with diabetic nephropathy: Secondary | ICD-10-CM | POA: Diagnosis not present

## 2019-07-10 DIAGNOSIS — I10 Essential (primary) hypertension: Secondary | ICD-10-CM | POA: Diagnosis not present

## 2019-07-10 DIAGNOSIS — I252 Old myocardial infarction: Secondary | ICD-10-CM | POA: Diagnosis not present

## 2019-07-10 DIAGNOSIS — Z794 Long term (current) use of insulin: Secondary | ICD-10-CM | POA: Diagnosis not present

## 2019-07-27 ENCOUNTER — Encounter (INDEPENDENT_AMBULATORY_CARE_PROVIDER_SITE_OTHER): Payer: Medicare Other | Admitting: Ophthalmology

## 2019-07-27 ENCOUNTER — Other Ambulatory Visit: Payer: Self-pay

## 2019-07-27 DIAGNOSIS — I10 Essential (primary) hypertension: Secondary | ICD-10-CM

## 2019-07-27 DIAGNOSIS — H43813 Vitreous degeneration, bilateral: Secondary | ICD-10-CM

## 2019-07-27 DIAGNOSIS — E11319 Type 2 diabetes mellitus with unspecified diabetic retinopathy without macular edema: Secondary | ICD-10-CM

## 2019-07-27 DIAGNOSIS — H35033 Hypertensive retinopathy, bilateral: Secondary | ICD-10-CM

## 2019-07-27 DIAGNOSIS — E113293 Type 2 diabetes mellitus with mild nonproliferative diabetic retinopathy without macular edema, bilateral: Secondary | ICD-10-CM

## 2019-07-27 DIAGNOSIS — H353132 Nonexudative age-related macular degeneration, bilateral, intermediate dry stage: Secondary | ICD-10-CM | POA: Diagnosis not present

## 2019-08-03 ENCOUNTER — Encounter: Payer: Self-pay | Admitting: Endocrinology

## 2019-08-03 ENCOUNTER — Ambulatory Visit (INDEPENDENT_AMBULATORY_CARE_PROVIDER_SITE_OTHER): Payer: Medicare Other | Admitting: Endocrinology

## 2019-08-03 ENCOUNTER — Other Ambulatory Visit: Payer: Self-pay

## 2019-08-03 VITALS — BP 140/70 | HR 74 | Ht 71.0 in | Wt 155.8 lb

## 2019-08-03 DIAGNOSIS — I255 Ischemic cardiomyopathy: Secondary | ICD-10-CM | POA: Diagnosis not present

## 2019-08-03 DIAGNOSIS — E1121 Type 2 diabetes mellitus with diabetic nephropathy: Secondary | ICD-10-CM | POA: Diagnosis not present

## 2019-08-03 DIAGNOSIS — E1165 Type 2 diabetes mellitus with hyperglycemia: Secondary | ICD-10-CM

## 2019-08-03 DIAGNOSIS — E11649 Type 2 diabetes mellitus with hypoglycemia without coma: Secondary | ICD-10-CM | POA: Diagnosis not present

## 2019-08-03 DIAGNOSIS — IMO0002 Reserved for concepts with insufficient information to code with codable children: Secondary | ICD-10-CM

## 2019-08-03 DIAGNOSIS — Z794 Long term (current) use of insulin: Secondary | ICD-10-CM | POA: Diagnosis not present

## 2019-08-03 LAB — POCT GLYCOSYLATED HEMOGLOBIN (HGB A1C): Hemoglobin A1C: 6.5 % — AB (ref 4.0–5.6)
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
## 2019-08-03 NOTE — Patient Instructions (Addendum)
Needs reviewed instructions for Humalog, also to review these with your son:  Humalog at breakfast: will take 7 units if eating oatmeal and 6 units if eating any eggs or meat If the blood sugar is below 140 take only 3 units and may do so right after finishing eating   At lunchtime and dinnertime HUMALOG dose:  Will take 7 units for regular meals and 8 units if eating any fried food or beef.  If the blood sugar is below 140 take only 3 units and may do so right after finishing eating  If the blood sugar is between 200-300 increase the Humalog dose by 1 units If the blood sugar is over 300 may increase the dose by 2 units on top of the insulin for the meals  Make sure you check the blood sugar before each meal and at bedtime daily

## 2019-08-13 DIAGNOSIS — Z0000 Encounter for general adult medical examination without abnormal findings: Secondary | ICD-10-CM | POA: Diagnosis not present

## 2019-08-24 DIAGNOSIS — Z8546 Personal history of malignant neoplasm of prostate: Secondary | ICD-10-CM | POA: Diagnosis not present

## 2019-08-24 DIAGNOSIS — I10 Essential (primary) hypertension: Secondary | ICD-10-CM | POA: Diagnosis not present

## 2019-08-24 DIAGNOSIS — I252 Old myocardial infarction: Secondary | ICD-10-CM | POA: Diagnosis not present

## 2019-08-24 DIAGNOSIS — E1121 Type 2 diabetes mellitus with diabetic nephropathy: Secondary | ICD-10-CM | POA: Diagnosis not present

## 2019-08-24 DIAGNOSIS — I255 Ischemic cardiomyopathy: Secondary | ICD-10-CM | POA: Diagnosis not present

## 2019-08-24 DIAGNOSIS — N189 Chronic kidney disease, unspecified: Secondary | ICD-10-CM | POA: Diagnosis not present

## 2019-08-24 DIAGNOSIS — I25119 Atherosclerotic heart disease of native coronary artery with unspecified angina pectoris: Secondary | ICD-10-CM | POA: Diagnosis not present

## 2019-09-04 ENCOUNTER — Other Ambulatory Visit: Payer: Self-pay

## 2019-09-04 ENCOUNTER — Emergency Department (HOSPITAL_COMMUNITY): Payer: Medicare Other

## 2019-09-04 ENCOUNTER — Other Ambulatory Visit: Payer: Self-pay | Admitting: Interventional Cardiology

## 2019-09-04 ENCOUNTER — Observation Stay (HOSPITAL_COMMUNITY)
Admission: EM | Admit: 2019-09-04 | Discharge: 2019-09-05 | Disposition: A | Discharge status: Home / Self Care | Payer: Medicare Other | Attending: Internal Medicine | Admitting: Internal Medicine

## 2019-09-04 DIAGNOSIS — Z87891 Personal history of nicotine dependence: Secondary | ICD-10-CM | POA: Diagnosis not present

## 2019-09-04 DIAGNOSIS — Z20828 Contact with and (suspected) exposure to other viral communicable diseases: Secondary | ICD-10-CM | POA: Diagnosis not present

## 2019-09-04 DIAGNOSIS — R05 Cough: Secondary | ICD-10-CM | POA: Diagnosis not present

## 2019-09-04 DIAGNOSIS — I10 Essential (primary) hypertension: Secondary | ICD-10-CM | POA: Diagnosis present

## 2019-09-04 DIAGNOSIS — Z79899 Other long term (current) drug therapy: Secondary | ICD-10-CM | POA: Diagnosis not present

## 2019-09-04 DIAGNOSIS — R0789 Other chest pain: Secondary | ICD-10-CM | POA: Diagnosis not present

## 2019-09-04 DIAGNOSIS — I252 Old myocardial infarction: Secondary | ICD-10-CM

## 2019-09-04 DIAGNOSIS — I2089 Other forms of angina pectoris: Secondary | ICD-10-CM | POA: Diagnosis present

## 2019-09-04 DIAGNOSIS — Z955 Presence of coronary angioplasty implant and graft: Secondary | ICD-10-CM | POA: Insufficient documentation

## 2019-09-04 DIAGNOSIS — E7800 Pure hypercholesterolemia, unspecified: Secondary | ICD-10-CM | POA: Diagnosis not present

## 2019-09-04 DIAGNOSIS — R0602 Shortness of breath: Secondary | ICD-10-CM | POA: Insufficient documentation

## 2019-09-04 DIAGNOSIS — I255 Ischemic cardiomyopathy: Secondary | ICD-10-CM | POA: Diagnosis not present

## 2019-09-04 DIAGNOSIS — I4892 Unspecified atrial flutter: Secondary | ICD-10-CM | POA: Diagnosis not present

## 2019-09-04 DIAGNOSIS — I208 Other forms of angina pectoris: Secondary | ICD-10-CM | POA: Diagnosis not present

## 2019-09-04 DIAGNOSIS — I2510 Atherosclerotic heart disease of native coronary artery without angina pectoris: Secondary | ICD-10-CM | POA: Diagnosis present

## 2019-09-04 DIAGNOSIS — I130 Hypertensive heart and chronic kidney disease with heart failure and stage 1 through stage 4 chronic kidney disease, or unspecified chronic kidney disease: Secondary | ICD-10-CM | POA: Diagnosis not present

## 2019-09-04 DIAGNOSIS — N184 Chronic kidney disease, stage 4 (severe): Secondary | ICD-10-CM | POA: Diagnosis not present

## 2019-09-04 DIAGNOSIS — I2583 Coronary atherosclerosis due to lipid rich plaque: Secondary | ICD-10-CM | POA: Diagnosis not present

## 2019-09-04 DIAGNOSIS — E1122 Type 2 diabetes mellitus with diabetic chronic kidney disease: Secondary | ICD-10-CM | POA: Insufficient documentation

## 2019-09-04 DIAGNOSIS — Z794 Long term (current) use of insulin: Secondary | ICD-10-CM | POA: Insufficient documentation

## 2019-09-04 DIAGNOSIS — Z7902 Long term (current) use of antithrombotics/antiplatelets: Secondary | ICD-10-CM | POA: Insufficient documentation

## 2019-09-04 DIAGNOSIS — E1129 Type 2 diabetes mellitus with other diabetic kidney complication: Secondary | ICD-10-CM | POA: Diagnosis present

## 2019-09-04 DIAGNOSIS — E1151 Type 2 diabetes mellitus with diabetic peripheral angiopathy without gangrene: Secondary | ICD-10-CM | POA: Insufficient documentation

## 2019-09-04 DIAGNOSIS — I209 Angina pectoris, unspecified: Secondary | ICD-10-CM

## 2019-09-04 DIAGNOSIS — I5022 Chronic systolic (congestive) heart failure: Secondary | ICD-10-CM | POA: Diagnosis present

## 2019-09-04 LAB — CBC
HCT: 31.1 % — ABNORMAL LOW (ref 39.0–52.0)
Hemoglobin: 10.5 g/dL — ABNORMAL LOW (ref 13.0–17.0)
MCH: 35.4 pg — ABNORMAL HIGH (ref 26.0–34.0)
MCHC: 33.8 g/dL (ref 30.0–36.0)
MCV: 104.7 fL — ABNORMAL HIGH (ref 80.0–100.0)
Platelets: 382 10*3/uL (ref 150–400)
RBC: 2.97 MIL/uL — ABNORMAL LOW (ref 4.22–5.81)
RDW: 23.6 % — ABNORMAL HIGH (ref 11.5–15.5)
WBC: 12 10*3/uL — ABNORMAL HIGH (ref 4.0–10.5)
nRBC: 0.3 % — ABNORMAL HIGH (ref 0.0–0.2)

## 2019-09-04 LAB — COMPREHENSIVE METABOLIC PANEL
ALT: 13 U/L (ref 0–44)
AST: 23 U/L (ref 15–41)
Albumin: 4.2 g/dL (ref 3.5–5.0)
Alkaline Phosphatase: 39 U/L (ref 38–126)
Anion gap: 14 (ref 5–15)
BUN: 32 mg/dL — ABNORMAL HIGH (ref 8–23)
CO2: 23 mmol/L (ref 22–32)
Calcium: 10 mg/dL (ref 8.9–10.3)
Chloride: 106 mmol/L (ref 98–111)
Creatinine, Ser: 2.13 mg/dL — ABNORMAL HIGH (ref 0.61–1.24)
GFR calc Af Amer: 31 mL/min — ABNORMAL LOW (ref >60)
GFR calc non Af Amer: 27 mL/min — ABNORMAL LOW (ref >60)
Glucose, Bld: 179 mg/dL — ABNORMAL HIGH (ref 70–99)
Potassium: 4 mmol/L (ref 3.5–5.1)
Sodium: 143 mmol/L (ref 135–145)
Total Bilirubin: 2.3 mg/dL — ABNORMAL HIGH (ref 0.3–1.2)
Total Protein: 6.8 g/dL (ref 6.5–8.1)

## 2019-09-04 LAB — LIPASE, BLOOD: Lipase: 27 U/L (ref 11–51)

## 2019-09-04 LAB — MAGNESIUM: Magnesium: 2.2 mg/dL (ref 1.7–2.4)

## 2019-09-04 LAB — TROPONIN I (HIGH SENSITIVITY)
Troponin I (High Sensitivity): 13 ng/L (ref <18)
Troponin I (High Sensitivity): 41 ng/L — ABNORMAL HIGH (ref <18)

## 2019-09-04 LAB — SARS CORONAVIRUS 2 (TAT 6-24 HRS): SARS Coronavirus 2: NEGATIVE

## 2019-09-04 MED ORDER — NITROGLYCERIN 0.4 MG SL SUBL: 0.4000 mg | SUBLINGUAL_TABLET | SUBLINGUAL | Status: DC | PRN

## 2019-09-04 MED ORDER — ASPIRIN 81 MG PO CHEW
324.0000 mg | CHEWABLE_TABLET | Freq: Once | ORAL | Status: AC
  Administered 2019-09-04: 324 mg via ORAL
  Filled 2019-09-04: qty 4

## 2019-09-04 MED ORDER — MORPHINE SULFATE (PF) 4 MG/ML IV SOLN
4.0000 mg | Freq: Once | INTRAVENOUS | Status: AC
  Administered 2019-09-04: 4 mg via INTRAVENOUS
  Filled 2019-09-04: qty 1

## 2019-09-04 NOTE — ED Notes (Signed)
Report attempted, to call back in 10 minutes

## 2019-09-04 NOTE — ED Notes (Signed)
Verified with EDP- ok for pt to eat, oncoming nurse to give snack,
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
## 2019-09-04 NOTE — ED Notes (Signed)
Patient given turkey sandwich and diet ginger ale.
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
## 2019-09-04 NOTE — ED Triage Notes (Addendum)
Pt to ED c/o soreness on the right and left side of chest since last night. Reports cough that started last week, got worse last night with thick white sputum. C/o SHOB C/o nausea. HX MI,. New onset A fib rate, per pt , no a fib that he is aware

## 2019-09-05 ENCOUNTER — Observation Stay (HOSPITAL_BASED_OUTPATIENT_CLINIC_OR_DEPARTMENT_OTHER): Payer: Medicare Other

## 2019-09-05 DIAGNOSIS — I340 Nonrheumatic mitral (valve) insufficiency: Secondary | ICD-10-CM

## 2019-09-05 DIAGNOSIS — I361 Nonrheumatic tricuspid (valve) insufficiency: Secondary | ICD-10-CM

## 2019-09-05 DIAGNOSIS — R0789 Other chest pain: Secondary | ICD-10-CM | POA: Diagnosis not present

## 2019-09-05 DIAGNOSIS — I208 Other forms of angina pectoris: Secondary | ICD-10-CM | POA: Diagnosis not present

## 2019-09-05 LAB — BRAIN NATRIURETIC PEPTIDE: B Natriuretic Peptide: 1237.9 pg/mL — ABNORMAL HIGH (ref 0.0–100.0)

## 2019-09-05 LAB — GLUCOSE, CAPILLARY
Glucose-Capillary: 223 mg/dL — ABNORMAL HIGH (ref 70–99)
Glucose-Capillary: 234 mg/dL — ABNORMAL HIGH (ref 70–99)
Glucose-Capillary: 253 mg/dL — ABNORMAL HIGH (ref 70–99)

## 2019-09-05 LAB — ECHOCARDIOGRAM COMPLETE
Height: 69 in
Weight: 2361.6 oz

## 2019-09-05 LAB — CBC
HCT: 29.8 % — ABNORMAL LOW (ref 39.0–52.0)
Hemoglobin: 10.1 g/dL — ABNORMAL LOW (ref 13.0–17.0)
MCH: 35.6 pg — ABNORMAL HIGH (ref 26.0–34.0)
MCHC: 33.9 g/dL (ref 30.0–36.0)
MCV: 104.9 fL — ABNORMAL HIGH (ref 80.0–100.0)
Platelets: 280 10*3/uL (ref 150–400)
RBC: 2.84 MIL/uL — ABNORMAL LOW (ref 4.22–5.81)
RDW: 22.5 % — ABNORMAL HIGH (ref 11.5–15.5)
WBC: 17 10*3/uL — ABNORMAL HIGH (ref 4.0–10.5)
nRBC: 0.3 % — ABNORMAL HIGH (ref 0.0–0.2)

## 2019-09-05 LAB — SARS CORONAVIRUS 2 (TAT 6-24 HRS): SARS Coronavirus 2: NEGATIVE

## 2019-09-05 LAB — LIPID PANEL
Cholesterol: 125 mg/dL (ref 0–200)
HDL: 67 mg/dL (ref >40)
LDL Cholesterol: 50 mg/dL (ref 0–99)
Total CHOL/HDL Ratio: 1.9 RATIO
Triglycerides: 40 mg/dL (ref <150)
VLDL: 8 mg/dL (ref 0–40)

## 2019-09-05 LAB — CREATININE, SERUM
Creatinine, Ser: 2.09 mg/dL — ABNORMAL HIGH (ref 0.61–1.24)
GFR calc Af Amer: 32 mL/min — ABNORMAL LOW (ref >60)
GFR calc non Af Amer: 27 mL/min — ABNORMAL LOW (ref >60)

## 2019-09-05 LAB — TROPONIN I (HIGH SENSITIVITY)
Troponin I (High Sensitivity): 51 ng/L — ABNORMAL HIGH (ref <18)
Troponin I (High Sensitivity): 16 ng/L (ref <18)
Troponin I (High Sensitivity): 19 ng/L — ABNORMAL HIGH (ref <18)

## 2019-09-05 MED ORDER — ACETAMINOPHEN 325 MG PO TABS: 650.0000 mg | ORAL_TABLET | ORAL | Status: DC | PRN

## 2019-09-05 MED ORDER — INSULIN GLARGINE 100 UNIT/ML ~~LOC~~ SOLN
8.0000 [IU] | Freq: Every day | SUBCUTANEOUS | Status: DC
  Administered 2019-09-05: 8 [IU] via SUBCUTANEOUS
  Filled 2019-09-05: qty 0.08

## 2019-09-05 MED ORDER — METOPROLOL TARTRATE 25 MG PO TABS: 12.5000 mg | ORAL_TABLET | Freq: Two times a day (BID) | ORAL | 0 refills | Status: DC

## 2019-09-05 MED ORDER — PRAVASTATIN SODIUM 40 MG PO TABS: 80.0000 mg | ORAL_TABLET | Freq: Every day | ORAL | Status: DC

## 2019-09-05 MED ORDER — ONDANSETRON HCL 4 MG/2ML IJ SOLN: 4.0000 mg | Freq: Four times a day (QID) | INTRAMUSCULAR | Status: DC | PRN

## 2019-09-05 MED ORDER — METOPROLOL TARTRATE 12.5 MG HALF TABLET
12.5000 mg | ORAL_TABLET | Freq: Two times a day (BID) | ORAL | Status: DC
  Administered 2019-09-05: 12.5 mg via ORAL
  Filled 2019-09-05: qty 1

## 2019-09-05 MED ORDER — METOPROLOL TARTRATE 25 MG PO TABS: 25.0000 mg | ORAL_TABLET | Freq: Two times a day (BID) | ORAL | Status: DC

## 2019-09-05 MED ORDER — ENOXAPARIN SODIUM 80 MG/0.8ML ~~LOC~~ SOLN: 1.0000 mg/kg | Freq: Every day | SUBCUTANEOUS | Status: DC

## 2019-09-05 MED ORDER — FAMOTIDINE 20 MG PO TABS
20.0000 mg | ORAL_TABLET | Freq: Two times a day (BID) | ORAL | Status: DC
  Administered 2019-09-05: 20 mg via ORAL
  Filled 2019-09-05: qty 1

## 2019-09-05 MED ORDER — CLOPIDOGREL BISULFATE 75 MG PO TABS
75.0000 mg | ORAL_TABLET | Freq: Every day | ORAL | Status: DC
  Administered 2019-09-05: 75 mg via ORAL
  Filled 2019-09-05: qty 1

## 2019-09-05 NOTE — Discharge Summary (Signed)
Physician Discharge Summary  Robert [name] PPJ:[id number] DOB: [birth year] DOA: 09/04/2019  PCP: [name], MD  Admit date: 09/04/2019 Discharge date: 09/05/2019  Time spent: 35 minutes  Recommendations for Outpatient Follow-up:  1. PCP in 1 week 2. Cardiology Dr.Varanasi in 2weeks, started low-dose metoprolol, titrate as needed follow-up   Discharge Diagnoses:  Transient chest pain Paroxysmal Atrial flutter CAD Ischemic cardiomyopathy Chronic systolic CHF, EF of 58%   Type II diabetes mellitus with renal manifestations (HCC)   Essential hypertension, benign   CKD (chronic kidney disease), stage IV (HCC)   Old MI (myocardial infarction)   Coronary artery disease due to lipid rich plaque   High cholesterol   Chronic systolic heart failure [facility])   Discharge Condition: stable  Diet recommendation: Heart healthy, low-sodium  Filed Weights   09/04/19 1418 09/04/19 1941 09/05/19 0016  Weight: 72.6 kg 72.6 kg 67 kg    History of present illness:  85 year old male with history of CAD/ischemic cardiomyopathy EF of 35%, paroxysmal atrial flutter, stage IV kidney disease, diabetes, hypertension, dyslipidemia presented with atypical chest pain 12/12  Hospital Course:   Robert [name] is an 85 year old male with history of CAD, ischemic cardiomyopathy, EF of 35%, paroxysmal atrial flutter, not on anticoagulation was admitted following an episode of transient chest pain yesterday.  -Atypical chest pain, - this was on the right and left side of his chest, transient, resolved -Initial high-sensitivity troponin was 13 which went up to 41, subsequently came down to 16 and then 19 -EKG without acute ST-T wave changes -Patient is symptom-free at this time and is extremely anxious and adamant to go home -Echocardiogram noted EF of 35 to 40% with global hypokinesis which is unchanged from prior -Given patient's insistence to discharge home and no evidence of ACS at this time and no  recurrence of symptoms I discharged him home, also added low-dose beta-blocker to his medication regimen -Advised close follow-up with his primary cardiologist Dr.Varanasi in 2 weeks  Ischemic cardiomyopathy/chronic systolic CHF -EF of 09%, echo today is unchanged from prior -Remained euvolemic, lungs are clear, continued on oral Lasix, started low-dose beta-blocker  Paroxysmal atrial flutter -Known prior diagnosis per cardiology notes patient has declined anticoagulation on multiple discussions in the past, he is continued on Plavix for this -Added low-dose beta-blocker for better heart rate control and for his CHF  Rest of his chronic medical problems were stable   Discharge Exam: Vitals:   09/05/19 0815 09/05/19 1253  BP: (!) 149/63 (!) 125/50  Pulse: 75 95  Resp: 18 18  Temp: 98.3 F (36.8 C) 97.7 F (36.5 C)  SpO2: 100% 96%    General: AAOx3 Cardiovascular: S1-S2, irregular rhythm Respiratory: Clear  Discharge Instructions   Discharge Instructions    Diet - low sodium heart healthy   Complete by: As directed    Increase activity slowly   Complete by: As directed      Allergies as of 09/05/2019   No Known Allergies     Medication List    TAKE these medications   ALPRAZolam 0.25 MG tablet Commonly known as: XANAX Take 0.25 mg by mouth at bedtime as needed for anxiety or sleep.   Basaglar KwikPen 100 UNIT/ML Sopn INJECT 16 UNITS UNDER THE SKIN ONCE DAILY. What changed:   how much to take  how to take this  when to take this  additional instructions   BD Pen Needle Nano U/F 32G X 4 MM Misc Generic drug: Insulin Pen Needle USE TO INJECT  INSULIN 4  TIMES DAILY AS DIRECTED   clopidogrel 75 MG tablet Commonly known as: PLAVIX TAKE 1 TABLET BY MOUTH  DAILY   furosemide 40 MG tablet Commonly known as: LASIX Take 40 mg by mouth daily.   glucose blood test strip Commonly known as: ONE TOUCH ULTRA TEST TEST 4 TIMES A DAY DIAG E11.29   insulin  lispro 100 UNIT/ML KwikPen Commonly known as: HumaLOG KwikPen Inject 9 units under the skin 3 times daily. DX:E11.65 What changed:   how much to take  how to take this  when to take this  additional instructions   metoprolol tartrate 25 MG tablet Commonly known as: LOPRESSOR Take 0.5 tablets (12.5 mg total) by mouth 2 (two) times daily.   Multi Vitamin Mens tablet Take 1 tablet by mouth daily.   multivitamin-lutein Caps capsule Take 1 capsule by mouth daily.   pravastatin 80 MG tablet Commonly known as: PRAVACHOL TAKE 1 TABLET BY MOUTH  DAILY   ranitidine 150 MG tablet Commonly known as: ZANTAC Take 150 mg by mouth 2 (two) times daily.   Vitamin D3 125 MCG (5000 UT) Caps Take 1 capsule by mouth daily.      No Known Allergies Follow-up Information    Little, Lennette Bihari, MD. Schedule an appointment as soon as possible for a visit in 1 week(s).   Specialty: Family Medicine Contact information: [place]  [address] [phone]        Jettie Booze, MD. Schedule an appointment as soon as possible for a visit in 2 week(s).   Specialties: Cardiology, Radiology, Interventional Cardiology Contact information: [address] [place] [address] [phone]            The results of significant diagnostics from this hospitalization (including imaging, microbiology, ancillary and laboratory) are listed below for reference.    Significant Diagnostic Studies: DG Chest Port 1 View  Result Date: 09/04/2019 CLINICAL DATA:  85 year old male with chest pain and shortness of breath. EXAM: PORTABLE CHEST 1 VIEW COMPARISON:  Chest radiographs 02/28/2017 and earlier. FINDINGS: Portable AP upright view at 1437 hours. Lower lung volumes and kyphotic positioning. Mediastinal contours appear stable and within normal limits. Visualized tracheal air column is within normal limits. No definite acute pulmonary opacity. There is mild  asymmetric chronic scarring in the right upper lobe. No pleural effusion or pneumothorax. No acute osseous abnormality identified. IMPRESSION: No acute cardiopulmonary abnormality identified. Electronically Signed   By: Genevie Ann M.D.   On: 09/04/2019 15:02   ECHOCARDIOGRAM COMPLETE  Result Date: 09/05/2019   ECHOCARDIOGRAM REPORT   Patient Name:   Robert [name] Date of Exam: 09/05/2019 Medical Rec #:  [id number]    Height:       69.0 in Accession #:    [id number]   Weight:       147.6 lb Date of Birth:  [birth year]   BSA:          1.82 m Patient Age:    50 years     BP:           149/63 mmHg Patient Gender: M            HR:           94 bpm. Exam Location:  Inpatient Procedure: 2D Echo, Cardiac Doppler and Color Doppler Indications:    R07.9* Chest pain, unspecified  History:        Patient has prior history of Echocardiogram examinations, most  recent 03/02/2017. Cardiomyopathy, CAD; Risk Factors:Hypertension                 and Diabetes. CKD. Heart Block. Cancer.  Sonographer:    Jonelle Sidle Dance Referring Phys: [place]  1. Left ventricular ejection fraction, by visual estimation, is 35 to 40%. The left ventricle has mildly decreased function. There is mildly increased left ventricular hypertrophy.  2. Elevated left atrial and left ventricular end-diastolic pressures.  3. Left ventricular diastolic parameters are consistent with Grade II diastolic dysfunction (pseudonormalization).  4. The left ventricle demonstrates global hypokinesis.  5. Global right ventricle has low normal systolic function.The right ventricular size is normal. No increase in right ventricular wall thickness.  6. Left atrial size was moderately dilated.  7. Right atrial size was normal.  8. The mitral valve is abnormal. Mild mitral valve regurgitation.  9. The tricuspid valve is grossly normal. Tricuspid valve regurgitation is mild. 10. The aortic valve is tricuspid. Aortic valve regurgitation is trivial.  Mild aortic valve sclerosis without stenosis. 11. The pulmonic valve was grossly normal. Pulmonic valve regurgitation is trivial. 12. Moderately elevated pulmonary artery systolic pressure. 13. The inferior vena cava is normal in size with <50% respiratory variability, suggesting right atrial pressure of 8 mmHg. 14. The interatrial septum appears to be lipomatous. FINDINGS  Left Ventricle: Left ventricular ejection fraction, by visual estimation, is 35 to 40%. The left ventricle has mildly decreased function. The left ventricle demonstrates global hypokinesis. There is mildly increased left ventricular hypertrophy. Left ventricular diastolic parameters are consistent with Grade II diastolic dysfunction (pseudonormalization). Elevated left atrial and left ventricular end-diastolic pressures. Right Ventricle: The right ventricular size is normal. No increase in right ventricular wall thickness. Global RV systolic function is has low normal systolic function. The tricuspid regurgitant velocity is 3.08 m/s, and with an assumed right atrial pressure of 8 mmHg, the estimated right ventricular systolic pressure is moderately elevated at 45.9 mmHg. Left Atrium: Left atrial size was moderately dilated. Right Atrium: Right atrial size was normal in size Pericardium: There is no evidence of pericardial effusion. Mitral Valve: The mitral valve is abnormal. There is mild thickening of the mitral valve leaflet(s). Mild mitral valve regurgitation. Tricuspid Valve: The tricuspid valve is grossly normal. Tricuspid valve regurgitation is mild. Aortic Valve: The aortic valve is tricuspid. Aortic valve regurgitation is trivial. Aortic regurgitation PHT measures 338 msec. Mild aortic valve sclerosis is present, with no evidence of aortic valve stenosis. Pulmonic Valve: The pulmonic valve was grossly normal. Pulmonic valve regurgitation is trivial. Pulmonic regurgitation is trivial. Aorta: The aortic root and ascending aorta are  structurally normal, with no evidence of dilitation. Venous: The inferior vena cava is normal in size with less than 50% respiratory variability, suggesting right atrial pressure of 8 mmHg. IAS/Shunts: Increased thickness of the atrial septum sparing the fossa ovalis consistent with The interatrial septum appears to be lipomatous. No atrial level shunt detected by color flow Doppler.  LEFT VENTRICLE PLAX 2D LVIDd:         5.56 cm  Diastology LVIDs:         4.29 cm  LV e' lateral:   9.68 cm/s LV PW:         1.13 cm  LV E/e' lateral: 11.0 LV IVS:        0.77 cm  LV e' medial:    7.94 cm/s LVOT diam:     2.00 cm  LV E/e' medial:  13.4 LV SV:  69 ml LV SV Index:   37.99 LVOT Area:     3.14 cm  RIGHT VENTRICLE             IVC RV Basal diam:  2.72 cm     IVC diam: 1.81 cm RV S prime:     10.60 cm/s TAPSE (M-mode): 1.1 cm LEFT ATRIUM             Index       RIGHT ATRIUM           Index LA diam:        4.60 cm 2.53 cm/m  RA Area:     20.50 cm LA Vol (A2C):   85.7 ml 47.20 ml/m RA Volume:   60.10 ml  33.10 ml/m LA Vol (A4C):   68.4 ml 37.67 ml/m LA Biplane Vol: 76.5 ml 42.13 ml/m  AORTIC VALVE LVOT Vmax:   74.60 cm/s LVOT Vmean:  46.450 cm/s LVOT VTI:    0.114 m AI PHT:      338 msec  AORTA Ao Root diam: 3.90 cm Ao Asc diam:  3.60 cm MITRAL VALVE                         TRICUSPID VALVE MV Area (PHT): 4.49 cm              TR Peak grad:   37.9 mmHg MV PHT:        49.01 msec            TR Vmax:        330.00 cm/s MV Decel Time: 169 msec MV E velocity: 106.00 cm/s 103 cm/s  SHUNTS MV A velocity: 56.30 cm/s  70.3 cm/s Systemic VTI:  0.11 m MV E/A ratio:  1.88        1.5       Systemic Diam: 2.00 cm  Lyman Bishop MD Electronically signed by Lyman Bishop MD Signature Date/Time: 09/05/2019/2:30:01 PM    Final     Microbiology: Recent Results (from the past 240 hour(s))  SARS CORONAVIRUS 2 (TAT 6-24 HRS) Nasopharyngeal Nasopharyngeal Swab     Status: None   Collection Time: 09/04/19  4:59 PM   Specimen:  Nasopharyngeal Swab  Result Value Ref Range Status   SARS Coronavirus 2 NEGATIVE NEGATIVE Final    Comment: (NOTE) SARS-CoV-2 target nucleic acids are NOT DETECTED. The SARS-CoV-2 RNA is generally detectable in upper and lower respiratory specimens during the acute phase of infection. Negative results do not preclude SARS-CoV-2 infection, do not rule out co-infections with other pathogens, and should not be used as the sole basis for treatment or other patient management decisions. Negative results must be combined with clinical observations, patient history, and epidemiological information. The expected result is Negative. Fact Sheet for Patients: SugarRoll.be Fact Sheet for Healthcare Providers: https://www.woods-mathews.com/ This test is not yet approved or cleared by the [place] FDA and  has been authorized for detection and/or diagnosis of SARS-CoV-2 by FDA under an Emergency Use Authorization (EUA). This EUA will remain  in effect (meaning this test can be used) for the duration of the COVID-19 declaration under Section 56 4(b)(1) of the Act, 21 U.S.C. section 360bbb-3(b)(1), unless the authorization is terminated or revoked sooner. Performed at [facility], [place] [address], [place], [place] [address]   SARS CORONAVIRUS 2 (TAT 6-24 HRS) Nasopharyngeal Nasopharyngeal Swab     Status: None   Collection Time: 09/04/19 10:27 PM  Specimen: Nasopharyngeal Swab  Result Value Ref Range Status   SARS Coronavirus 2 NEGATIVE NEGATIVE Final    Comment: (NOTE) SARS-CoV-2 target nucleic acids are NOT DETECTED. The SARS-CoV-2 RNA is generally detectable in upper and lower respiratory specimens during the acute phase of infection. Negative results do not preclude SARS-CoV-2 infection, do not rule out co-infections with other pathogens, and should not be used as the sole basis for treatment or other patient management  decisions. Negative results must be combined with clinical observations, patient history, and epidemiological information. The expected result is Negative. Fact Sheet for Patients: SugarRoll.be Fact Sheet for Healthcare Providers: https://www.woods-mathews.com/ This test is not yet approved or cleared by the [place] FDA and  has been authorized for detection and/or diagnosis of SARS-CoV-2 by FDA under an Emergency Use Authorization (EUA). This EUA will remain  in effect (meaning this test can be used) for the duration of the COVID-19 declaration under Section 56 4(b)(1) of the Act, 21 U.S.C. section 360bbb-3(b)(1), unless the authorization is terminated or revoked sooner. Performed at [facility], [place] [address], [place], [place] [address]      Labs: Basic Metabolic Panel: Recent Labs  Lab 09/04/19 1608 09/05/19 0109  NA 143  --   K 4.0  --   CL 106  --   CO2 23  --   GLUCOSE 179*  --   BUN 32*  --   CREATININE 2.13* 2.09*  CALCIUM 10.0  --   MG 2.2  --    Liver Function Tests: Recent Labs  Lab 09/04/19 1608  AST 23  ALT 13  ALKPHOS 39  BILITOT 2.3*  PROT 6.8  ALBUMIN 4.2   Recent Labs  Lab 09/04/19 1608  LIPASE 27   No results for input(s): AMMONIA in the last 168 hours. CBC: Recent Labs  Lab 09/04/19 1435 09/05/19 0109  WBC 12.0* 17.0*  HGB 10.5* 10.1*  HCT 31.1* 29.8*  MCV 104.7* 104.9*  PLT 382 280   Cardiac Enzymes: No results for input(s): CKTOTAL, CKMB, CKMBINDEX, TROPONINI in the last 168 hours. BNP: BNP (last 3 results) Recent Labs    09/05/19 0109  BNP 1,237.9*    ProBNP (last 3 results) No results for input(s): PROBNP in the last 8760 hours.  CBG: Recent Labs  Lab 09/05/19 0032 09/05/19 0641  GLUCAP 253* 223*       Signed:  Domenic Polite MD.  [facility] 09/05/2019, 2:47 PM

## 2019-09-05 NOTE — Progress Notes (Signed)
SATURATION QUALIFICATIONS: (This note is used to comply with regulatory documentation for home oxygen)  Patient Saturations on Room Air at Rest = 98%  Patient Saturations on Room Air while Ambulating = 97%  Patient Saturations on 0 Liters of oxygen while Ambulating = 97%   Pt does not need home oxygen

## 2019-09-05 NOTE — Progress Notes (Signed)
  Echocardiogram 2D Echocardiogram has been performed.  Robert [name] 09/05/2019, 2:05 PM

## 2019-09-07 ENCOUNTER — Telehealth: Payer: Self-pay | Admitting: Interventional Cardiology

## 2019-09-07 NOTE — Telephone Encounter (Signed)
Called and spoke to patient's son. Made him aware that patient will need to keep his appointment tomorrow with Ermalinda Barrios, PA.
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
## 2019-09-07 NOTE — Telephone Encounter (Signed)
Spoke with pt's son who states he spoke with [place] earlier today regarding his father's SOB. He states tomorrow's appt will be okay, however he would prefer if his father could be seen today.   I advised him I would forward to Dr. Hassell Done nurse to follow up. He verbalized understanding and had no additional questions.

## 2019-09-07 NOTE — Telephone Encounter (Signed)
New Message  Pt c/o Shortness Of Breath: STAT if SOB developed within the last 24 hours or pt is noticeably SOB on the phone  1. Are you currently SOB (can you hear that pt is SOB on the phone)? No, patient is laying down  2. How long have you been experiencing SOB? Was in the ER over the weekend with SOB  3. Are you SOB when sitting or when up moving around? Both  4. Are you currently experiencing any other symptoms? Patient has constant SOB, unless he is laying down. Patient was just discharged from hospital and was told to follow up this week with cardiologist. Scheduled with Estella Husk on 09/08/19 at 9:30 am. Patient states that his SOB is bad and wants to be seen today if possible.

## 2019-09-08 ENCOUNTER — Telehealth: Payer: Self-pay | Admitting: Radiology

## 2019-09-08 ENCOUNTER — Other Ambulatory Visit: Payer: Self-pay

## 2019-09-08 ENCOUNTER — Encounter: Payer: Self-pay | Admitting: Physician Assistant

## 2019-09-08 ENCOUNTER — Ambulatory Visit (INDEPENDENT_AMBULATORY_CARE_PROVIDER_SITE_OTHER): Payer: Medicare Other | Admitting: Physician Assistant

## 2019-09-08 VITALS — BP 110/50 | HR 93 | Ht 69.0 in

## 2019-09-08 DIAGNOSIS — E782 Mixed hyperlipidemia: Secondary | ICD-10-CM

## 2019-09-08 DIAGNOSIS — I255 Ischemic cardiomyopathy: Secondary | ICD-10-CM

## 2019-09-08 DIAGNOSIS — N184 Chronic kidney disease, stage 4 (severe): Secondary | ICD-10-CM

## 2019-09-08 DIAGNOSIS — I10 Essential (primary) hypertension: Secondary | ICD-10-CM

## 2019-09-08 DIAGNOSIS — E1121 Type 2 diabetes mellitus with diabetic nephropathy: Secondary | ICD-10-CM | POA: Diagnosis not present

## 2019-09-08 DIAGNOSIS — I480 Paroxysmal atrial fibrillation: Secondary | ICD-10-CM

## 2019-09-08 DIAGNOSIS — I2510 Atherosclerotic heart disease of native coronary artery without angina pectoris: Secondary | ICD-10-CM | POA: Diagnosis not present

## 2019-09-08 DIAGNOSIS — I208 Other forms of angina pectoris: Secondary | ICD-10-CM | POA: Diagnosis not present

## 2019-09-08 MED ORDER — METOPROLOL SUCCINATE ER 25 MG PO TB24: 25.0000 mg | ORAL_TABLET | Freq: Every day | ORAL | 3 refills | Status: AC

## 2019-09-08 NOTE — Patient Instructions (Addendum)
Medication Instructions:  Your physician has recommended you make the following change in your medication:   1. STOP: metoprolol tartrate (lopressor)  2. START: metoprolol succinate (Toprol-XL) 25 mg tablet: Take 1 tablet by mouth once a day  3. Take an extra furosemide tablet today  If you need a refill on your cardiac medications before your next appointment, please call your pharmacy.   Lab work: None Ordered  If you have labs (blood work) drawn today and your tests are completely normal, you will receive your results only by: Marland Kitchen MyChart Message (if you have MyChart) OR . A paper copy in the mail If you have any lab test that is abnormal or we need to change your treatment, we will call you to review the results.  Testing/Procedures: Your physician has recommended that you wear a 14 day monitor. These monitors are medical devices that record the heart's electrical activity. Doctors most often use these monitors to diagnose arrhythmias. Arrhythmias are problems with the speed or rhythm of the heartbeat. The monitor is a small, portable device. You can wear one while you do your normal daily activities. This is usually used to diagnose what is causing palpitations/syncope (passing out).  Follow-Up: . Keep appointment with Dr. [name] on 10/06/19 at 3:20 PM  Any Other Special Instructions Will Be Listed Below (If Applicable).  ZIO XT- Long Term Monitor Instructions   Your physician has requested you wear your ZIO patch monitor 14 days.   This is a single patch monitor.  [employer] supplies one patch monitor per enrollment.  Additional stickers are not available.   Please do not apply patch if you will be having a Nuclear Stress Test, Echocardiogram, Cardiac CT, MRI, or Chest Xray during the time frame you would be wearing the monitor. The patch cannot be worn during these tests.  You cannot remove and re-apply the ZIO XT patch monitor.   Your ZIO patch monitor will be sent USPS  Priority mail from [facility] directly to your home address. The monitor may also be mailed to a PO BOX if home delivery is not available.   It may take 3-5 days to receive your monitor after you have been enrolled.   Once you have received you monitor, please review enclosed instructions.  Your monitor has already been registered assigning a specific monitor serial # to you.   Applying the monitor   Shave hair from upper left chest.   Hold abrader disc by orange tab.  Rub abrader in 40 strokes over left upper chest as indicated in your monitor instructions.   Clean area with 4 enclosed alcohol pads .  Use all pads to assure are is cleaned thoroughly.  Let dry.   Apply patch as indicated in monitor instructions.  Patch will be place under collarbone on left side of chest with arrow pointing upward.   Rub patch adhesive wings for 2 minutes.Remove white label marked "1".  Remove white label marked "2".  Rub patch adhesive wings for 2 additional minutes.   While looking in a mirror, press and release button in center of patch.  A small green light will flash 3-4 times .  This will be your only indicator the monitor has been turned on.     Do not shower for the first 24 hours.  You may shower after the first 24 hours.   Press button if you feel a symptom. You will hear a small click.  Record Date, Time and Symptom in the  Patient Log Book.   When you are ready to remove patch, follow instructions on last 2 pages of Patient Log Book.  Stick patch monitor onto last page of Patient Log Book.   Place Patient Log Book in Maud box.  Use locking tab on box and tape box closed securely.  The [employer] has [employer] on it.  Please place in mailbox as soon as possible.  Your physician should have your test results approximately 7 days after the monitor has been mailed back to [facility].   Call Coy at [phone] if you have questions regarding  your ZIO XT patch monitor.  Call them immediately if you see an orange light blinking on your monitor.   If your monitor falls off in less than 4 days contact our Monitor department at [phone].  If your monitor becomes loose or falls off after 4 days call [employer] at [phone] for suggestions on securing your monitor.   Two Gram Sodium Diet 2000 mg  What is Sodium? Sodium is a mineral found naturally in many foods. The most significant source of sodium in the diet is table salt, which is about 40% sodium.  Processed, convenience, and preserved foods also contain a large amount of sodium.  The body needs only 500 mg of sodium daily to function,  A normal diet provides more than enough sodium even if you do not use salt.  Why Limit Sodium? A build up of sodium in the body can cause thirst, increased blood pressure, shortness of breath, and water retention.  Decreasing sodium in the diet can reduce edema and risk of heart attack or stroke associated with high blood pressure.  Keep in mind that there are many other factors involved in these health problems.  Heredity, obesity, lack of exercise, cigarette smoking, stress and what you eat all play a role.  General Guidelines:  Do not add salt at the table or in cooking.  One teaspoon of salt contains over 2 grams of sodium.  Read food labels  Avoid processed and convenience foods  Ask your dietitian before eating any foods not dicussed in the menu planning guidelines  Consult your physician if you wish to use a salt substitute or a sodium containing medication such as antacids.  Limit milk and milk products to 16 oz (2 cups) per day.  Shopping Hints:  READ LABELS!! "Dietetic" does not necessarily mean low sodium.  Salt and other sodium ingredients are often added to foods during processing.   Menu Planning Guidelines Food Group Choose More Often Avoid  Beverages (see also the milk group All fruit juices, low-sodium, salt-free  vegetables juices, low-sodium carbonated beverages Regular vegetable or tomato juices, commercially softened water used for drinking or cooking  Breads and Cereals Enriched white, wheat, rye and pumpernickel bread, hard rolls and dinner rolls; muffins, cornbread and waffles; most dry cereals, cooked cereal without added salt; unsalted crackers and breadsticks; low sodium or homemade bread crumbs Bread, rolls and crackers with salted tops; quick breads; instant hot cereals; pancakes; commercial bread stuffing; self-rising flower and biscuit mixes; regular bread crumbs or cracker crumbs  Desserts and Sweets Desserts and sweets mad with mild should be within allowance Instant pudding mixes and cake mixes  Fats Butter or margarine; vegetable oils; unsalted salad dressings, regular salad dressings limited to 1 Tbs; light, sour and heavy cream Regular salad dressings containing bacon fat, bacon bits, and salt pork; snack dips made with instant soup mixes or  processed cheese; salted nuts  Fruits Most fresh, frozen and canned fruits Fruits processed with salt or sodium-containing ingredient (some dried fruits are processed with sodium sulfites        Vegetables Fresh, frozen vegetables and low- sodium canned vegetables Regular canned vegetables, sauerkraut, pickled vegetables, and others prepared in brine; frozen vegetables in sauces; vegetables seasoned with ham, bacon or salt pork  Condiments, Sauces, Miscellaneous  Salt substitute with physician's approval; pepper, herbs, spices; vinegar, lemon or lime juice; hot pepper sauce; garlic powder, onion powder, low sodium soy sauce (1 Tbs.); low sodium condiments (ketchup, chili sauce, mustard) in limited amounts (1 tsp.) fresh ground horseradish; unsalted tortilla chips, pretzels, potato chips, popcorn, salsa (1/4 cup) Any seasoning made with salt including garlic salt, celery salt, onion salt, and seasoned salt; sea salt, rock salt, kosher salt; meat  tenderizers; monosodium glutamate; mustard, regular soy sauce, barbecue, sauce, chili sauce, teriyaki sauce, steak sauce, Worcestershire sauce, and most flavored vinegars; canned gravy and mixes; regular condiments; salted snack foods, olives, picles, relish, horseradish sauce, catsup   Food preparation: Try these seasonings Meats:    Pork Sage, onion Serve with applesauce  Chicken Poultry seasoning, thyme, parsley Serve with cranberry sauce  Lamb Curry powder, rosemary, garlic, thyme Serve with mint sauce or jelly  Veal Marjoram, basil Serve with current jelly, cranberry sauce  Beef Pepper, bay leaf Serve with dry mustard, unsalted chive butter  Fish [place] leaf, dill Serve with unsalted lemon butter, unsalted parsley butter  Vegetables:    Asparagus Lemon juice   Broccoli Lemon juice   Carrots Mustard dressing parsley, mint, nutmeg, glazed with unsalted butter and sugar   Green beans Marjoram, lemon juice, nutmeg,dill seed   Tomatoes [name], marjoram, onion   Spice /blend for [employer]" 4 tsp ground thyme 1 tsp ground sage 3 tsp ground rosemary 4 tsp ground marjoram   Test your knowledge 1. A product that says "Salt Free" may still contain sodium. True or False 2. Garlic Powder and Hot Pepper Sauce an be used as alternative seasonings.True or False 3. Processed foods have more sodium than fresh foods.  True or False 4. Canned Vegetables have less sodium than froze True or False  WAYS TO DECREASE YOUR SODIUM INTAKE 1. Avoid the use of added salt in cooking and at the table.  Table salt (and other prepared seasonings which contain salt) is probably one of the greatest sources of sodium in the diet.  Unsalted foods can gain flavor from the sweet, sour, and butter taste sensations of herbs and spices.  Instead of using salt for seasoning, try the following seasonings with the foods listed.  Remember: how you use them to enhance natural food flavors is limited only by your  creativity... Allspice-Meat, fish, eggs, fruit, peas, red and yellow vegetables Almond Extract-Fruit baked goods Anise Seed-Sweet breads, fruit, carrots, beets, cottage cheese, cookies (tastes like licorice) [name]-Meat, fish, eggs, vegetables, rice, vegetables salads, soups, sauces Bay Leaf-Meat, fish, stews, poultry Burnet-Salad, vegetables (cucumber-like flavor) Caraway Seed-Bread, cookies, cottage cheese, meat, vegetables, cheese, rice Cardamon-Baked goods, fruit, soups Celery Powder or seed-Salads, salad dressings, sauces, meatloaf, soup, bread.Do not use  celery salt Chervil-Meats, salads, fish, eggs, vegetables, cottage cheese (parsley-like flavor) Chili Power-Meatloaf, chicken cheese, corn, eggplant, egg dishes Chives-Salads cottage cheese, egg dishes, soups, vegetables, sauces Cilantro-Salsa, casseroles Cinnamon-Baked goods, fruit, pork, lamb, chicken, carrots Cloves-Fruit, baked goods, fish, pot roast, green beans, beets, carrots Coriander-Pastry, cookies, meat, salads, cheese (lemon-orange flavor) Cumin-Meatloaf, fish,cheese, eggs, cabbage,fruit pie (caraway  flavor) [employer], fruit, eggs, fish, poultry, cottage cheese, vegetables Dill Seed-Meat, cottage cheese, poultry, vegetables, fish, salads, bread Fennel Seed-Bread, cookies, apples, pork, eggs, fish, beets, cabbage, cheese, Licorice-like flavor Garlic-(buds or powder) Salads, meat, poultry, fish, bread, butter, vegetables, potatoes.Do not  use garlic salt Ginger-Fruit, vegetables, baked goods, meat, fish, poultry Horseradish Root-Meet, vegetables, butter Lemon Juice or Extract-Vegetables, fruit, tea, baked goods, fish salads Mace-Baked goods fruit, vegetables, fish, poultry (taste like nutmeg) Maple Extract-Syrups Marjoram-Meat, chicken, fish, vegetables, breads, green salads (taste like Sage) Mint-Tea, lamb, sherbet, vegetables, desserts, carrots, cabbage Mustard, Dry or Seed-Cheese, eggs, meats, vegetables,  poultry Nutmeg-Baked goods, fruit, chicken, eggs, vegetables, desserts Onion Powder-Meat, fish, poultry, vegetables, cheese, eggs, bread, rice salads (Do not use   Onion salt) Orange Extract-Desserts, baked goods Oregano-Pasta, eggs, cheese, onions, pork, lamb, fish, chicken, vegetables, green salads Paprika-Meat, fish, poultry, eggs, cheese, vegetables Parsley Flakes-Butter, vegetables, meat fish, poultry, eggs, bread, salads (certain forms may   Contain sodium Pepper-Meat fish, poultry, vegetables, eggs Peppermint Extract-Desserts, baked goods Poppy Seed-Eggs, bread, cheese, fruit dressings, baked goods, noodles, vegetables, cottage  [employer], poultry, meat, fish, cauliflower, turnips,eggs bread Saffron-Rice, bread, veal, chicken, fish, eggs Sage-Meat, fish, poultry, onions, eggplant, tomateos, pork, stews Savory-Eggs, salads, poultry, meat, rice, vegetables, soups, pork Tarragon-Meat, poultry, fish, eggs, butter, vegetables (licorice-like flavor)  Thyme-Meat, poultry, fish, eggs, vegetables, (clover-like flavor), sauces, soups Tumeric-Salads, butter, eggs, fish, rice, vegetables (saffron-like flavor) Vanilla Extract-Baked goods, candy Vinegar-Salads, vegetables, meat marinades Walnut Extract-baked goods, candy  2. Choose your Foods Wisely   The following is a list of foods to avoid which are high in sodium:  Meats-Avoid all smoked, canned, salt cured, dried and kosher meat and fish as well as Anchovies   [employer] meats:Bologna, Liverwurst, Pastrami Canned meat or fish  Marinated herring Caviar    Pepperoni Corned Beef   Pizza Dried chipped beef  Salami Frozen breaded fish or meat Salt pork Frankfurters or hot dogs  Sardines Gefilte fish   Sausage Ham (boiled ham, Proscuitto Smoked butt    spiced ham)   Spam      TV Dinners Vegetables Canned vegetables (Regular) Relish Canned mushrooms  Sauerkraut Olives    Tomato  juice Pickles  Bakery and Dessert Products Canned puddings  Cream pies Cheesecake   Decorated cakes Cookies  Beverages/Juices Tomato juice, regular  Gatorade   V-8 vegetable juice, regular  Breads and Cereals Biscuit mixes   Salted potato chips, corn chips, pretzels Bread stuffing mixes  Salted crackers and rolls Pancake and waffle mixes Self-rising flour  Seasonings Accent    Meat sauces Barbecue sauce  Meat tenderizer Catsup    Monosodium glutamate (MSG) Celery salt   Onion salt Chili sauce   Prepared mustard Garlic salt   Salt, seasoned salt, sea salt Gravy mixes   Soy sauce Horseradish   Steak sauce Ketchup   Tartar sauce Lite salt    Teriyaki sauce Marinade mixes   Worcestershire sauce  Others Baking powder   Cocoa and cocoa mixes Baking soda   Commercial casserole mixes Candy-caramels, chocolate  Dehydrated soups    Bars, fudge,nougats  Instant rice and pasta mixes Canned broth or soup  Maraschino cherries Cheese, aged and processed cheese and cheese spreads  Learning Assessment Quiz  Indicated T (for True) or F (for False) for each of the following statements:  1. _____ Fresh fruits and vegetables and unprocessed grains are generally low in sodium 2. _____ Water may contain a  considerable amount of sodium, depending on the source 3. _____ You can always tell if a food is high in sodium by tasting it 4. _____ Certain laxatives my be high in sodium and should be avoided unless prescribed   by a physician or pharmacist 5. _____ Salt substitutes may be used freely by anyone on a sodium restricted diet 6. _____ Sodium is present in table salt, food additives and as a natural component of   most foods 7. _____ Table salt is approximately 90% sodium 8. _____ Limiting sodium intake may help prevent excess fluid accumulation in the body 9. _____ On a sodium-restricted diet, seasonings such as bouillon soy sauce, and    cooking wine should be used in place of table  salt 10. _____ On an ingredient list, a product which lists monosodium glutamate as the first   ingredient is an appropriate food to include on a low sodium diet  Circle the best answer(s) to the following statements (Hint: there may be more than one correct answer)  11. On a low-sodium diet, some acceptable snack items are:    A. Olives  F. Bean dip   K. Grapefruit juice    B. Salted Pretzels G. Commercial Popcorn   L. Canned peaches    C. Carrot Sticks  H. Bouillon   M. Unsalted nuts   D. [place] fries  I. Peanut butter crackers N. Salami   E. Sweet pickles J. Tomato Juice   O. Pizza  12.  Seasonings that may be used freely on a reduced - sodium diet include   A. Lemon wedges F.Monosodium glutamate K. Celery seed    B.Soysauce   G. Pepper   L. Mustard powder   C. Sea salt  H. Cooking wine  M. Onion flakes   D. Vinegar  E. Prepared horseradish N. Salsa   E. Sage   J. Worcestershire sauce  O. Chutney

## 2019-09-08 NOTE — Telephone Encounter (Signed)
Enrolled patient for a 14 Day Zio monitor to be mailed to patients home.

## 2019-09-13 ENCOUNTER — Encounter (HOSPITAL_COMMUNITY): Payer: Self-pay | Admitting: Emergency Medicine

## 2019-09-13 ENCOUNTER — Inpatient Hospital Stay (HOSPITAL_COMMUNITY)
Admission: EM | Admit: 2019-09-13 | Discharge: 2019-09-25 | DRG: 871 | Disposition: E | Payer: Medicare Other | Attending: Family Medicine | Admitting: Family Medicine

## 2019-09-13 ENCOUNTER — Emergency Department (HOSPITAL_COMMUNITY): Payer: Medicare Other

## 2019-09-13 ENCOUNTER — Other Ambulatory Visit: Payer: Self-pay

## 2019-09-13 DIAGNOSIS — R4182 Altered mental status, unspecified: Secondary | ICD-10-CM | POA: Diagnosis not present

## 2019-09-13 DIAGNOSIS — E870 Hyperosmolality and hypernatremia: Secondary | ICD-10-CM | POA: Diagnosis not present

## 2019-09-13 DIAGNOSIS — R7881 Bacteremia: Secondary | ICD-10-CM | POA: Diagnosis not present

## 2019-09-13 DIAGNOSIS — K819 Cholecystitis, unspecified: Secondary | ICD-10-CM

## 2019-09-13 DIAGNOSIS — R0902 Hypoxemia: Secondary | ICD-10-CM | POA: Diagnosis not present

## 2019-09-13 DIAGNOSIS — K810 Acute cholecystitis: Secondary | ICD-10-CM | POA: Diagnosis present

## 2019-09-13 DIAGNOSIS — N184 Chronic kidney disease, stage 4 (severe): Secondary | ICD-10-CM | POA: Diagnosis present

## 2019-09-13 DIAGNOSIS — Z7902 Long term (current) use of antithrombotics/antiplatelets: Secondary | ICD-10-CM

## 2019-09-13 DIAGNOSIS — B961 Klebsiella pneumoniae [K. pneumoniae] as the cause of diseases classified elsewhere: Secondary | ICD-10-CM | POA: Diagnosis present

## 2019-09-13 DIAGNOSIS — I2510 Atherosclerotic heart disease of native coronary artery without angina pectoris: Secondary | ICD-10-CM | POA: Diagnosis present

## 2019-09-13 DIAGNOSIS — A419 Sepsis, unspecified organism: Secondary | ICD-10-CM | POA: Diagnosis present

## 2019-09-13 DIAGNOSIS — K820 Obstruction of gallbladder: Secondary | ICD-10-CM | POA: Diagnosis not present

## 2019-09-13 DIAGNOSIS — R1011 Right upper quadrant pain: Secondary | ICD-10-CM | POA: Diagnosis not present

## 2019-09-13 DIAGNOSIS — Z8546 Personal history of malignant neoplasm of prostate: Secondary | ICD-10-CM | POA: Diagnosis not present

## 2019-09-13 DIAGNOSIS — E1165 Type 2 diabetes mellitus with hyperglycemia: Secondary | ICD-10-CM | POA: Diagnosis not present

## 2019-09-13 DIAGNOSIS — I340 Nonrheumatic mitral (valve) insufficiency: Secondary | ICD-10-CM | POA: Diagnosis present

## 2019-09-13 DIAGNOSIS — I255 Ischemic cardiomyopathy: Secondary | ICD-10-CM | POA: Diagnosis present

## 2019-09-13 DIAGNOSIS — I130 Hypertensive heart and chronic kidney disease with heart failure and stage 1 through stage 4 chronic kidney disease, or unspecified chronic kidney disease: Secondary | ICD-10-CM | POA: Diagnosis present

## 2019-09-13 DIAGNOSIS — R509 Fever, unspecified: Secondary | ICD-10-CM

## 2019-09-13 DIAGNOSIS — E46 Unspecified protein-calorie malnutrition: Secondary | ICD-10-CM | POA: Diagnosis present

## 2019-09-13 DIAGNOSIS — I469 Cardiac arrest, cause unspecified: Secondary | ICD-10-CM | POA: Diagnosis not present

## 2019-09-13 DIAGNOSIS — I5043 Acute on chronic combined systolic (congestive) and diastolic (congestive) heart failure: Secondary | ICD-10-CM | POA: Diagnosis present

## 2019-09-13 DIAGNOSIS — D539 Nutritional anemia, unspecified: Secondary | ICD-10-CM | POA: Diagnosis present

## 2019-09-13 DIAGNOSIS — G9341 Metabolic encephalopathy: Secondary | ICD-10-CM | POA: Diagnosis present

## 2019-09-13 DIAGNOSIS — Z515 Encounter for palliative care: Secondary | ICD-10-CM

## 2019-09-13 DIAGNOSIS — Z923 Personal history of irradiation: Secondary | ICD-10-CM

## 2019-09-13 DIAGNOSIS — B955 Unspecified streptococcus as the cause of diseases classified elsewhere: Secondary | ICD-10-CM | POA: Diagnosis not present

## 2019-09-13 DIAGNOSIS — R64 Cachexia: Secondary | ICD-10-CM | POA: Diagnosis present

## 2019-09-13 DIAGNOSIS — E1151 Type 2 diabetes mellitus with diabetic peripheral angiopathy without gangrene: Secondary | ICD-10-CM | POA: Diagnosis present

## 2019-09-13 DIAGNOSIS — R160 Hepatomegaly, not elsewhere classified: Secondary | ICD-10-CM | POA: Diagnosis not present

## 2019-09-13 DIAGNOSIS — R6520 Severe sepsis without septic shock: Secondary | ICD-10-CM | POA: Diagnosis present

## 2019-09-13 DIAGNOSIS — R0602 Shortness of breath: Secondary | ICD-10-CM

## 2019-09-13 DIAGNOSIS — I480 Paroxysmal atrial fibrillation: Secondary | ICD-10-CM

## 2019-09-13 DIAGNOSIS — K8001 Calculus of gallbladder with acute cholecystitis with obstruction: Secondary | ICD-10-CM | POA: Diagnosis present

## 2019-09-13 DIAGNOSIS — Z20828 Contact with and (suspected) exposure to other viral communicable diseases: Secondary | ICD-10-CM | POA: Diagnosis present

## 2019-09-13 DIAGNOSIS — E1129 Type 2 diabetes mellitus with other diabetic kidney complication: Secondary | ICD-10-CM | POA: Diagnosis present

## 2019-09-13 DIAGNOSIS — I361 Nonrheumatic tricuspid (valve) insufficiency: Secondary | ICD-10-CM | POA: Diagnosis not present

## 2019-09-13 DIAGNOSIS — F419 Anxiety disorder, unspecified: Secondary | ICD-10-CM | POA: Diagnosis present

## 2019-09-13 DIAGNOSIS — I493 Ventricular premature depolarization: Secondary | ICD-10-CM | POA: Diagnosis present

## 2019-09-13 DIAGNOSIS — I442 Atrioventricular block, complete: Secondary | ICD-10-CM | POA: Diagnosis present

## 2019-09-13 DIAGNOSIS — A408 Other streptococcal sepsis: Principal | ICD-10-CM | POA: Diagnosis present

## 2019-09-13 DIAGNOSIS — D509 Iron deficiency anemia, unspecified: Secondary | ICD-10-CM | POA: Diagnosis present

## 2019-09-13 DIAGNOSIS — E1122 Type 2 diabetes mellitus with diabetic chronic kidney disease: Secondary | ICD-10-CM | POA: Diagnosis present

## 2019-09-13 DIAGNOSIS — H9190 Unspecified hearing loss, unspecified ear: Secondary | ICD-10-CM | POA: Diagnosis present

## 2019-09-13 DIAGNOSIS — Z8249 Family history of ischemic heart disease and other diseases of the circulatory system: Secondary | ICD-10-CM

## 2019-09-13 DIAGNOSIS — Z6823 Body mass index (BMI) 23.0-23.9, adult: Secondary | ICD-10-CM

## 2019-09-13 DIAGNOSIS — Z955 Presence of coronary angioplasty implant and graft: Secondary | ICD-10-CM | POA: Diagnosis not present

## 2019-09-13 DIAGNOSIS — Z79899 Other long term (current) drug therapy: Secondary | ICD-10-CM

## 2019-09-13 DIAGNOSIS — K219 Gastro-esophageal reflux disease without esophagitis: Secondary | ICD-10-CM | POA: Diagnosis present

## 2019-09-13 DIAGNOSIS — Z87891 Personal history of nicotine dependence: Secondary | ICD-10-CM

## 2019-09-13 DIAGNOSIS — Z794 Long term (current) use of insulin: Secondary | ICD-10-CM | POA: Diagnosis not present

## 2019-09-13 DIAGNOSIS — R627 Adult failure to thrive: Secondary | ICD-10-CM | POA: Diagnosis present

## 2019-09-13 DIAGNOSIS — E782 Mixed hyperlipidemia: Secondary | ICD-10-CM | POA: Diagnosis present

## 2019-09-13 DIAGNOSIS — I509 Heart failure, unspecified: Secondary | ICD-10-CM

## 2019-09-13 DIAGNOSIS — E785 Hyperlipidemia, unspecified: Secondary | ICD-10-CM | POA: Diagnosis present

## 2019-09-13 DIAGNOSIS — E1121 Type 2 diabetes mellitus with diabetic nephropathy: Secondary | ICD-10-CM | POA: Diagnosis not present

## 2019-09-13 DIAGNOSIS — E7800 Pure hypercholesterolemia, unspecified: Secondary | ICD-10-CM | POA: Diagnosis present

## 2019-09-13 DIAGNOSIS — I252 Old myocardial infarction: Secondary | ICD-10-CM

## 2019-09-13 LAB — CBC
HCT: 22.5 % — ABNORMAL LOW (ref 39.0–52.0)
Hemoglobin: 7.8 g/dL — ABNORMAL LOW (ref 13.0–17.0)
MCH: 35.8 pg — ABNORMAL HIGH (ref 26.0–34.0)
MCHC: 34.7 g/dL (ref 30.0–36.0)
MCV: 103.2 fL — ABNORMAL HIGH (ref 80.0–100.0)
Platelets: 374 10*3/uL (ref 150–400)
RBC: 2.18 MIL/uL — ABNORMAL LOW (ref 4.22–5.81)
RDW: 23 % — ABNORMAL HIGH (ref 11.5–15.5)
WBC: 20.8 10*3/uL — ABNORMAL HIGH (ref 4.0–10.5)
nRBC: 0.3 % — ABNORMAL HIGH (ref 0.0–0.2)

## 2019-09-13 LAB — URINALYSIS, ROUTINE W REFLEX MICROSCOPIC
Bilirubin Urine: NEGATIVE
Glucose, UA: >=500 mg/dL — AB
Hgb urine dipstick: NEGATIVE
Ketones, ur: NEGATIVE mg/dL
Leukocytes,Ua: NEGATIVE
Nitrite: NEGATIVE
Protein, ur: NEGATIVE mg/dL
Specific Gravity, Urine: 1.013 (ref 1.005–1.030)
pH: 5 (ref 5.0–8.0)

## 2019-09-13 LAB — COMPREHENSIVE METABOLIC PANEL
ALT: 30 U/L (ref 0–44)
AST: 39 U/L (ref 15–41)
Albumin: 2.5 g/dL — ABNORMAL LOW (ref 3.5–5.0)
Alkaline Phosphatase: 95 U/L (ref 38–126)
Anion gap: 12 (ref 5–15)
BUN: 68 mg/dL — ABNORMAL HIGH (ref 8–23)
CO2: 20 mmol/L — ABNORMAL LOW (ref 22–32)
Calcium: 8.6 mg/dL — ABNORMAL LOW (ref 8.9–10.3)
Chloride: 111 mmol/L (ref 98–111)
Creatinine, Ser: 2.19 mg/dL — ABNORMAL HIGH (ref 0.61–1.24)
GFR calc Af Amer: 30 mL/min — ABNORMAL LOW (ref >60)
GFR calc non Af Amer: 26 mL/min — ABNORMAL LOW (ref >60)
Glucose, Bld: 98 mg/dL (ref 70–99)
Potassium: 4.6 mmol/L (ref 3.5–5.1)
Sodium: 143 mmol/L (ref 135–145)
Total Bilirubin: 1.4 mg/dL — ABNORMAL HIGH (ref 0.3–1.2)
Total Protein: 5.5 g/dL — ABNORMAL LOW (ref 6.5–8.1)

## 2019-09-13 LAB — CBG MONITORING, ED: Glucose-Capillary: 125 mg/dL — ABNORMAL HIGH (ref 70–99)

## 2019-09-13 LAB — RESPIRATORY PANEL BY RT PCR (FLU A&B, COVID)
Influenza A by PCR: NEGATIVE
Influenza B by PCR: NEGATIVE
SARS Coronavirus 2 by RT PCR: NEGATIVE

## 2019-09-13 LAB — POC SARS CORONAVIRUS 2 AG -  ED: SARS Coronavirus 2 Ag: NEGATIVE

## 2019-09-13 LAB — PROTIME-INR
INR: 1.1 (ref 0.8–1.2)
Prothrombin Time: 14.4 seconds (ref 11.4–15.2)

## 2019-09-13 LAB — LIPASE, BLOOD: Lipase: 38 U/L (ref 11–51)

## 2019-09-13 LAB — LACTIC ACID, PLASMA
Lactic Acid, Venous: 2.1 mmol/L (ref 0.5–1.9)
Lactic Acid, Venous: 1.8 mmol/L (ref 0.5–1.9)

## 2019-09-13 MED ORDER — VANCOMYCIN HCL 1250 MG/250ML IV SOLN
1250.0000 mg | Freq: Once | INTRAVENOUS | Status: AC
  Administered 2019-09-13: 1250 mg via INTRAVENOUS
  Filled 2019-09-13: qty 250

## 2019-09-13 MED ORDER — SODIUM CHLORIDE 0.9 % IV SOLN
2.0000 g | INTRAVENOUS | Status: DC
  Administered 2019-09-14 – 2019-09-18 (×5): 2 g via INTRAVENOUS
  Filled 2019-09-13 (×6): qty 2

## 2019-09-13 MED ORDER — METRONIDAZOLE IN NACL 5-0.79 MG/ML-% IV SOLN
500.0000 mg | Freq: Once | INTRAVENOUS | Status: AC
  Administered 2019-09-13: 19:00:00 500 mg via INTRAVENOUS
  Filled 2019-09-13: qty 100

## 2019-09-13 MED ORDER — VANCOMYCIN HCL IN DEXTROSE 1-5 GM/200ML-% IV SOLN: 1000.0000 mg | Freq: Once | INTRAVENOUS | Status: DC

## 2019-09-13 MED ORDER — SODIUM CHLORIDE 0.9 % IV BOLUS
1000.0000 mL | Freq: Once | INTRAVENOUS | Status: AC
  Administered 2019-09-13: 19:00:00 1000 mL via INTRAVENOUS

## 2019-09-13 MED ORDER — LORAZEPAM 2 MG/ML IJ SOLN
0.5000 mg | Freq: Once | INTRAMUSCULAR | Status: AC
  Administered 2019-09-13: 20:00:00 0.5 mg via INTRAVENOUS
  Filled 2019-09-13: qty 1

## 2019-09-13 MED ORDER — SODIUM CHLORIDE 0.9 % IV BOLUS: 1000.0000 mL | Freq: Once | INTRAVENOUS | Status: DC

## 2019-09-13 MED ORDER — VANCOMYCIN HCL IN DEXTROSE 1-5 GM/200ML-% IV SOLN: 1000.0000 mg | INTRAVENOUS | Status: DC

## 2019-09-13 MED ORDER — SODIUM CHLORIDE 0.9 % IV BOLUS
500.0000 mL | Freq: Once | INTRAVENOUS | Status: AC
  Administered 2019-09-13: 15:00:00 500 mL via INTRAVENOUS

## 2019-09-13 MED ORDER — SODIUM CHLORIDE 0.9 % IV SOLN
2.0000 g | Freq: Once | INTRAVENOUS | Status: AC
  Administered 2019-09-13: 19:00:00 2 g via INTRAVENOUS
  Filled 2019-09-13: qty 2
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
## 2019-09-13 NOTE — Progress Notes (Signed)
Pharmacy Antibiotic Note  Robert [name] is a 85 y.o. male admitted on 08/31/2019 with infection of unknown source.  Pharmacy has been consulted for vancomycin/cefepime dosing. SCr 2.19 on admit (appears to be ~baseline).  Plan: Cefepime 2g IV x 1; then 2g IV q24h Vancomycin 1250mg  IV x1; then Vancomycin 1000 mg IV Q 48 hrs. Goal AUC 400-550. Expected AUC: 443 SCr used: 2.19 Flagyl 500mg  IV x 1 per EDP - f/u if to continue Monitor clinical progress, c/s, renal function F/u de-escalation plan/LOT, vancomycin levels as indicated      Temp (24hrs), Avg:99.6 F (37.6 C), Min:98.3 F (36.8 C), Max:100.9 F (38.3 C)  Recent Labs  Lab 08/31/2019 1543 09/21/2019 1632  WBC  --  20.8*  CREATININE 2.19*  --     Estimated Creatinine Clearance: 21.7 mL/min (A) (by C-G formula based on SCr of 2.19 mg/dL (H)).    No Known Allergies  Antimicrobials this admission: 12/20 vancomycin >>  12/20 cefepime >>  12/20 flagyl x 1  Dose adjustments this admission:   Microbiology results:   Elicia Lamp, PharmD, BCPS Clinical Pharmacist 09/15/2019 7:18 PM

## 2019-09-13 NOTE — ED Triage Notes (Signed)
Pt BIB [facility] for increased weakness and lethargy since his recent discharge from the hospital on 12/11. Per EMS patient is normally alert and oriented x4 but has been more confused and not acting himself for about a week now. EMS reports patient is in a-fib with a heart rate of 80-120 and a CBG of 333. Family gave patient 8units of humalog just prior to EMS arrival. Pt arrives to the ED alert and oriented to person and place only. Denying any pain at present time.

## 2019-09-13 NOTE — ED Provider Notes (Addendum)
Assumed care of patient at 2100.  Patient CT shows a new hypoattenuating mass in the right hepatic lobe concerning for malignancy.  However this is suboptimally evaluated because patient did not receive contrast due to renal function.  Also patient has bilateral pleural effusions cardiomegaly and trace pericardial effusion.  He has cholelithiasis with mild gallbladder wall thickening and they recommended a right upper quadrant ultrasound.  Given patient's new fever, leukocytosis of 20,000 and prior history of cholecystitis we will do the ultrasound.  He is already been covered with broad-spectrum antibiotics and is Covid PCR negative.  Plan will be admit  11:03 PM U/s consistent with cholecystitis.  Will consult general surgery and admit to medicine. Spoke with Dr. Brantley Stage who will consult on the pt. Blanchie Dessert, MD 09/10/2019 1460    Blanchie Dessert, MD 09/21/2019 2317
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
## 2019-09-14 ENCOUNTER — Other Ambulatory Visit: Payer: Self-pay

## 2019-09-14 ENCOUNTER — Inpatient Hospital Stay (HOSPITAL_COMMUNITY): Payer: Medicare Other

## 2019-09-14 DIAGNOSIS — K810 Acute cholecystitis: Secondary | ICD-10-CM

## 2019-09-14 DIAGNOSIS — I509 Heart failure, unspecified: Secondary | ICD-10-CM

## 2019-09-14 LAB — CBC
HCT: 26.5 % — ABNORMAL LOW (ref 39.0–52.0)
Hemoglobin: 9 g/dL — ABNORMAL LOW (ref 13.0–17.0)
MCH: 35 pg — ABNORMAL HIGH (ref 26.0–34.0)
MCHC: 34 g/dL (ref 30.0–36.0)
MCV: 103.1 fL — ABNORMAL HIGH (ref 80.0–100.0)
Platelets: 378 10*3/uL (ref 150–400)
RBC: 2.57 MIL/uL — ABNORMAL LOW (ref 4.22–5.81)
RDW: 23.1 % — ABNORMAL HIGH (ref 11.5–15.5)
WBC: 28.3 10*3/uL — ABNORMAL HIGH (ref 4.0–10.5)
nRBC: 0.2 % (ref 0.0–0.2)

## 2019-09-14 LAB — RETICULOCYTES
Immature Retic Fract: 28.7 % — ABNORMAL HIGH (ref 2.3–15.9)
RBC.: 2.57 MIL/uL — ABNORMAL LOW (ref 4.22–5.81)
Retic Count, Absolute: 34.7 10*3/uL (ref 19.0–186.0)
Retic Ct Pct: 1.4 % (ref 0.4–3.1)

## 2019-09-14 LAB — COMPREHENSIVE METABOLIC PANEL
ALT: 30 U/L (ref 0–44)
AST: 22 U/L (ref 15–41)
Albumin: 2.5 g/dL — ABNORMAL LOW (ref 3.5–5.0)
Alkaline Phosphatase: 95 U/L (ref 38–126)
Anion gap: 15 (ref 5–15)
BUN: 60 mg/dL — ABNORMAL HIGH (ref 8–23)
CO2: 23 mmol/L (ref 22–32)
Calcium: 9.2 mg/dL (ref 8.9–10.3)
Chloride: 109 mmol/L (ref 98–111)
Creatinine, Ser: 2.3 mg/dL — ABNORMAL HIGH (ref 0.61–1.24)
GFR calc Af Amer: 28 mL/min — ABNORMAL LOW (ref >60)
GFR calc non Af Amer: 24 mL/min — ABNORMAL LOW (ref >60)
Glucose, Bld: 127 mg/dL — ABNORMAL HIGH (ref 70–99)
Potassium: 3.8 mmol/L (ref 3.5–5.1)
Sodium: 147 mmol/L — ABNORMAL HIGH (ref 135–145)
Total Bilirubin: 1.3 mg/dL — ABNORMAL HIGH (ref 0.3–1.2)
Total Protein: 5.7 g/dL — ABNORMAL LOW (ref 6.5–8.1)

## 2019-09-14 LAB — BRAIN NATRIURETIC PEPTIDE: B Natriuretic Peptide: 874 pg/mL — ABNORMAL HIGH (ref 0.0–100.0)

## 2019-09-14 LAB — GLUCOSE, CAPILLARY
Glucose-Capillary: 126 mg/dL — ABNORMAL HIGH (ref 70–99)
Glucose-Capillary: 163 mg/dL — ABNORMAL HIGH (ref 70–99)
Glucose-Capillary: 175 mg/dL — ABNORMAL HIGH (ref 70–99)
Glucose-Capillary: 214 mg/dL — ABNORMAL HIGH (ref 70–99)
Glucose-Capillary: 220 mg/dL — ABNORMAL HIGH (ref 70–99)

## 2019-09-14 LAB — IRON AND TIBC
Iron: 22 ug/dL — ABNORMAL LOW (ref 45–182)
Saturation Ratios: 12 % — ABNORMAL LOW (ref 17.9–39.5)
TIBC: 189 ug/dL — ABNORMAL LOW (ref 250–450)
UIBC: 167 ug/dL

## 2019-09-14 LAB — MAGNESIUM: Magnesium: 2.4 mg/dL (ref 1.7–2.4)

## 2019-09-14 LAB — VITAMIN B12: Vitamin B-12: 2124 pg/mL — ABNORMAL HIGH (ref 180–914)

## 2019-09-14 LAB — FOLATE: Folate: 46.8 ng/mL (ref >5.9)

## 2019-09-14 LAB — FERRITIN: Ferritin: 919 ng/mL — ABNORMAL HIGH (ref 24–336)

## 2019-09-14 MED ORDER — LORAZEPAM 2 MG/ML IJ SOLN
0.5000 mg | Freq: Once | INTRAMUSCULAR | Status: AC
  Administered 2019-09-14: 02:00:00 0.5 mg via INTRAVENOUS
  Filled 2019-09-14: qty 1

## 2019-09-14 MED ORDER — MORPHINE SULFATE (PF) 4 MG/ML IV SOLN
INTRAVENOUS | Status: AC
  Filled 2019-09-14: qty 1

## 2019-09-14 MED ORDER — METOPROLOL SUCCINATE ER 25 MG PO TB24: 12.5000 mg | ORAL_TABLET | Freq: Every day | ORAL | Status: DC

## 2019-09-14 MED ORDER — LORAZEPAM 2 MG/ML IJ SOLN
0.5000 mg | Freq: Three times a day (TID) | INTRAMUSCULAR | Status: DC | PRN
  Administered 2019-09-14 – 2019-09-18 (×6): 0.5 mg via INTRAVENOUS
  Filled 2019-09-14 (×7): qty 1

## 2019-09-14 MED ORDER — FUROSEMIDE 10 MG/ML IJ SOLN
20.0000 mg | Freq: Once | INTRAMUSCULAR | Status: AC
  Administered 2019-09-14: 15:00:00 20 mg via INTRAVENOUS
  Filled 2019-09-14: qty 2

## 2019-09-14 MED ORDER — INSULIN ASPART 100 UNIT/ML ~~LOC~~ SOLN
0.0000 [IU] | SUBCUTANEOUS | Status: DC
  Administered 2019-09-14 (×3): 2 [IU] via SUBCUTANEOUS
  Administered 2019-09-14: 3 [IU] via SUBCUTANEOUS
  Administered 2019-09-14: 05:00:00 1 [IU] via SUBCUTANEOUS
  Administered 2019-09-15 (×2): 2 [IU] via SUBCUTANEOUS
  Administered 2019-09-15: 04:00:00 3 [IU] via SUBCUTANEOUS
  Administered 2019-09-15: 2 [IU] via SUBCUTANEOUS
  Administered 2019-09-15: 17:00:00 3 [IU] via SUBCUTANEOUS
  Administered 2019-09-15: 12:00:00 2 [IU] via SUBCUTANEOUS
  Administered 2019-09-16: 16:00:00 3 [IU] via SUBCUTANEOUS
  Administered 2019-09-16 (×2): 2 [IU] via SUBCUTANEOUS
  Administered 2019-09-16: 20:00:00 5 [IU] via SUBCUTANEOUS
  Administered 2019-09-16: 12:00:00 2 [IU] via SUBCUTANEOUS

## 2019-09-14 MED ORDER — ACETAMINOPHEN 650 MG RE SUPP: 650.0000 mg | Freq: Four times a day (QID) | RECTAL | Status: DC | PRN

## 2019-09-14 MED ORDER — TECHNETIUM TC 99M MEBROFENIN IV KIT
5.2000 | PACK | Freq: Once | INTRAVENOUS | Status: AC | PRN
  Administered 2019-09-14: 11:00:00 5.2 via INTRAVENOUS

## 2019-09-14 MED ORDER — ACETAMINOPHEN 325 MG PO TABS
650.0000 mg | ORAL_TABLET | Freq: Four times a day (QID) | ORAL | Status: DC | PRN
  Administered 2019-09-15 – 2019-09-21 (×5): 650 mg via ORAL
  Filled 2019-09-14 (×5): qty 2

## 2019-09-14 MED ORDER — METRONIDAZOLE IN NACL 5-0.79 MG/ML-% IV SOLN
500.0000 mg | Freq: Three times a day (TID) | INTRAVENOUS | Status: DC
  Administered 2019-09-14 – 2019-09-19 (×17): 500 mg via INTRAVENOUS
  Filled 2019-09-14 (×18): qty 100

## 2019-09-14 MED ORDER — MORPHINE SULFATE (PF) 4 MG/ML IV SOLN: 3.0000 mg | Freq: Once | INTRAVENOUS | Status: DC

## 2019-09-14 NOTE — Progress Notes (Signed)
Adin Hector 09/14/2019     Subjective: Obtunded.  Sitter at bedside.  Appears malnourished and deconditioned.  No respiratory distress.  Skin warm and dry. Maximum temperature 98.2.  Heart rate 72. Urine output seems reasonable WBC 28,300.  Hemoglobin 9.0, up from 7.8.  All LFTs normal except total bilirubin 1.3.  BNP 874.   Objective: Vital signs in last 24 hours: Temp:  [98.2 F (36.8 C)-100.9 F (38.3 C)] 98.2 F (36.8 C) (12/21 0339) Pulse Rate:  [67-91] 72 (12/21 0339) Resp:  [15-32] 19 (12/21 0339) BP: (112-146)/(50-93) 146/50 (12/21 0339) SpO2:  [80 %-100 %] 100 % (12/21 0339)    Intake/Output from previous day: 12/20 0701 - 12/21 0700 In: 0  Out: 200 [Urine:200] Intake/Output this shift: No intake/output data recorded.  General appearance: Very feeble.  Mild pallor.  Skin warm and dry.  Nonverbal.  Markedly deconditioned. Resp: clear to auscultation bilaterally GI: Abdomen is not distended.  No palpable mass.  Variable nonlocalizing tenderness.  Lab Results:  Recent Labs    09/14/2019 1632 09/14/19 0333  WBC 20.8* 28.3*  HGB 7.8* 9.0*  HCT 22.5* 26.5*  PLT 374 378   BMET Recent Labs    08/28/2019 1543 09/14/19 0333  NA 143 147*  K 4.6 3.8  CL 111 109  CO2 20* 23  GLUCOSE 98 127*  BUN 68* 60*  CREATININE 2.19* 2.30*  CALCIUM 8.6* 9.2   PT/INR Recent Labs    09/02/2019 1916  LABPROT 14.4  INR 1.1   ABG No results for input(s): PHART, HCO3 in the last 72 hours.  Invalid input(s): PCO2, PO2  Studies/Results: CT ABDOMEN PELVIS WO CONTRAST  Result Date: 08/29/2019 CLINICAL DATA:  Abdominal pain and fever. The patient has a history of diabetes and prostate cancer. EXAM: CT ABDOMEN AND PELVIS WITHOUT CONTRAST TECHNIQUE: Multidetector CT imaging of the abdomen and pelvis was performed following the standard protocol without IV contrast. COMPARISON:  February 28, 2017 FINDINGS: Lower chest: There is a moderate sized partially visualized right-sided  pleural effusion. There is a small left-sided pleural effusion.The heart size is enlarged. There is a trace pericardial effusion. The intracardiac blood pool is hypodense relative to the adjacent myocardium consistent with anemia. Atelectasis is noted at the lung bases bilaterally. Hepatobiliary: There is I a hypoattenuating mass in the right hepatic lobe measuring approximately 5.1 x 4.1 by 6.9 cm. This is new since the prior study in 2018. There is cholelithiasis with some mild gallbladder wall thickening.There is no biliary ductal dilation. Pancreas: Normal contours without ductal dilatation. No peripancreatic fluid collection. Spleen: No splenic laceration or hematoma. Adrenals/Urinary Tract: --Adrenal glands: No adrenal hemorrhage. --Right kidney/ureter: The right kidney is atrophic. --Left kidney/ureter: The left kidney is atrophic. --Urinary bladder: Unremarkable. Stomach/Bowel: --Stomach/Duodenum: No hiatal hernia or other gastric abnormality. Normal duodenal course and caliber. --Small bowel: No dilatation or inflammation. --Colon: Rectosigmoid diverticulosis without acute inflammation. --Appendix: Normal. Vascular/Lymphatic: Atherosclerotic calcification is present within the non-aneurysmal abdominal aorta, without hemodynamically significant stenosis. --No retroperitoneal lymphadenopathy. --No mesenteric lymphadenopathy. --No pelvic or inguinal lymphadenopathy. Reproductive: Unremarkable Other: No ascites or free air. The patient is status post bilateral inguinal hernia repair. Musculoskeletal. No acute displaced fractures. IMPRESSION: 1. New hypoattenuating mass in the right hepatic lobe, concerning for malignancy. This is suboptimally evaluated on this exam. Further evaluation with a contrast enhanced multiphase MRI or CT is recommended. 2. Bilateral pleural effusions, right greater than left. Given the above finding in the liver, consider further evaluation with a  contrast enhanced CT of the chest. 3.  Cardiomegaly and trace pericardial effusion. 4. Cholelithiasis with mild gallbladder wall thickening. If there is concern for acute cholecystitis, recommend further evaluation with right upper quadrant ultrasound. 5. Rectosigmoid diverticulosis without acute inflammation. Aortic Atherosclerosis (ICD10-I70.0). Electronically Signed   By: Constance Holster M.D.   On: 09/23/2019 21:23   CT Head Wo Contrast  Result Date: 09/18/2019 CLINICAL DATA:  85 year old male with altered mental status and increased weakness. EXAM: CT HEAD WITHOUT CONTRAST TECHNIQUE: Contiguous axial images were obtained from the base of the skull through the vertex without intravenous contrast. COMPARISON:  None. FINDINGS: Brain: No evidence of acute infarction, hemorrhage, hydrocephalus, extra-axial collection or mass lesion/mass effect. Atrophy, chronic small-vessel white matter ischemic changes and remote RIGHT cerebellar infarct noted. Vascular: Carotid and vertebral atherosclerotic calcifications noted Skull: No acute abnormality. Sinuses/Orbits: No acute abnormality Other: None IMPRESSION: 1. No evidence of acute intracranial abnormality. 2. Atrophy, chronic small-vessel white matter ischemic changes and remote RIGHT cerebellar infarct. Electronically Signed   By: Margarette Canada M.D.   On: 08/30/2019 16:00   DG Chest Port 1 View  Result Date: 09/03/2019 CLINICAL DATA:  Weakness EXAM: PORTABLE CHEST 1 VIEW COMPARISON:  09/04/2019 FINDINGS: Cardiomediastinal contours are stable accounting for portable technique and are partially obscured by graded opacity in the left and right chest. Signs of coronary stenting are noted along the left heart border. Increased interstitial markings are noted bilaterally without focal consolidation aside from obscuration of right hemidiaphragm. Spinal degenerative changes. Visualized skeletal structures otherwise unremarkable. IMPRESSION: 1. Probable new bilateral effusions and mild interstitial edema.  Electronically Signed   By: Zetta Bills M.D.   On: 09/16/2019 15:33   US Abdomen Limited RUQ  Result Date: 09/10/2019 CLINICAL DATA:  Right upper quadrant pain EXAM: ULTRASOUND ABDOMEN LIMITED RIGHT UPPER QUADRANT COMPARISON:  CT same day FINDINGS: Gallbladder: There are multiple shadowing gallstones filling the gallbladder the largest measuring 1.8 cm. There is a diffusely thickened gallbladder wall measuring 6.3 mm. No sonographic Percell Miller sign is seen. Common bile duct: Diameter: 2 mm. Liver: There is a heterogeneous appearance to the liver parenchyma. The hypodense mass seen on CT same day is not well visualized on this exam. Portal vein is patent on color Doppler imaging with normal direction of blood flow towards the liver. Other: None. IMPRESSION: Cholelithiasis with diffuse gallbladder wall thickening which could be suggestive of acute cholecystitis. Heterogeneous liver parenchyma. The ill-defined mass on recent CT is not well visualized on this examination. Would recommend CT with contrast or MRI for further evaluation. Electronically Signed   By: Prudencio Pair M.D.   On: 09/18/2019 22:58    Anti-infectives: Anti-infectives (From admission, onward)   Start     Dose/Rate Route Frequency Ordered Stop   09/15/19 2030  vancomycin (VANCOCIN) IVPB 1000 mg/200 mL premix  Status:  Discontinued     1,000 mg 200 mL/hr over 60 Minutes Intravenous Every 48 hours 09/24/2019 1923 09/14/19 0315   09/14/19 1930  ceFEPIme (MAXIPIME) 2 g in sodium chloride 0.9 % 100 mL IVPB     2 g 200 mL/hr over 30 Minutes Intravenous Every 24 hours 09/11/2019 1921     09/14/19 0600  metroNIDAZOLE (FLAGYL) IVPB 500 mg     500 mg 100 mL/hr over 60 Minutes Intravenous Every 8 hours 09/14/19 0315     09/03/2019 1930  vancomycin (VANCOREADY) IVPB 1250 mg/250 mL     1,250 mg 166.7 mL/hr over 90 Minutes Intravenous  Once 08/26/2019  1921 09/14/2019 2304   09/19/2019 1900  ceFEPIme (MAXIPIME) 2 g in sodium chloride 0.9 % 100 mL IVPB      2 g 200 mL/hr over 30 Minutes Intravenous  Once 09/21/2019 1852 09/04/2019 2024   09/21/2019 1900  metroNIDAZOLE (FLAGYL) IVPB 500 mg     500 mg 100 mL/hr over 60 Minutes Intravenous  Once 08/31/2019 1852 09/23/2019 2041   09/20/2019 1900  vancomycin (VANCOCIN) IVPB 1000 mg/200 mL premix  Status:  Discontinued     1,000 mg 200 mL/hr over 60 Minutes Intravenous  Once 09/12/2019 1852 09/02/2019 1921      Assessment/Plan:  Acute cholecystitis.  This is recurrent.  Past history of percutaneous drainage. Ultrasound consistent with acute cholecystitis Patient is a very poor surgical candidate and may need cholecystostomy tube Hepatobiliary scan scheduled for today.  If positive, recommend consult IR for percutaneous cholecystostomy tube Continue antibiotics  Combined systolic and diastolic congestive heart failure Paroxysmal atrial fibrillation Plavix on hold, apparently Metabolic encephalopathy, acute on chronic Liver mass-this can be evaluated later, if desired. IDDM Anemia.  Hemoglobin 9.0.  No evidence of active bleeding CKD stage IV      LOS: 1 day    Adin Hector 09/14/2019
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
## 2019-09-14 NOTE — ED Notes (Signed)
Pt continues to be confused and agitated, pt unwilling to stay in bed, continuously pulling lines, difficult to redirect. MD notified.

## 2019-09-14 NOTE — Progress Notes (Addendum)
Patient seen and examined, admitted earlier this morning by Dr. Marlowe Sax, briefly this is an chronically ill 85 year old male with history of CAD/remote PCI, stents, ischemic cardiomyopathy, EF of 35%, paroxysmal atrial fibrillation, moderate to severe mitral regurgitation, stage IV chronic kidney disease, prostate cancer, type 2 diabetes mellitus, peripheral vascular disease, history of cholecystitis in 2016 treated with percutaneous drain which was subsequently removed and 12/2017 presented to the ED overnight with mental status changes. -In the ED he was noted to be febrile with white count of 20 K, lactic acid of 2.1, total bili of 1.4, COVID-19 PCR is negative, chest x-ray showed small bilateral effusions, CT head was unremarkable, CT abdomen pelvis noted questionable new hypoattenuating mass in the right hepatic lobe concerning for possible malignancy, right upper quadrant ultrasound noted cholelithiasis with diffuse gallbladder wall thickening concerning for acute cholecystitis, he required multiple doses of Ativan in the ED for agitation, general surgery consulted who recommended HIDA scan  Recurrent acute cholecystitis -Treated for same few years ago with a percutaneous cholecystostomy tube which was removed 05/2018 as he is a very poor surgical candidate given advanced age and numerous cardiopulmonary comorbidities -General surgery consulting, HIDA scan recommended, if positive consult IR for another drain -Continue cefepime and metronidazole -Follow-up blood cultures  Paroxysmal atrial fibrillation -Rate controlled, followed by cardiology for this and has declined anticoagulation on numerous occasions, continued on Plavix  Acute metabolic encephalopathy -Secondary to acute cholecystitis most likely, may have some underlying dementia too,  -monitor  Abnormal CT with questionable liver mass -Hepatic, abdominal MRI ordered overnight on admission, will follow up  Type 2 diabetes  mellitus -Continue sliding scale every 4 hours while NPO, CBGs in the 1 20-1 60 range  Stage IV chronic kidney disease -stable  Macrocytic anemia -Anemia panel with some iron deficiency too -Due to advanced age will not pursue aggressive work-up, B12 and folate were within acceptable range  CAD/ischemic cardiomyopathy/chronic systolic CHF -EF of 26% -X-ray with small pleural effusions, asymptomatic now we will give small dose of IV Lasix 20 mg x 1  DVT prophylaxis; add Lovenox No family at bedside, attempted to contact son Pratik Dalziel, unable to leave a voicemail on his cell phone-attempted twice and then left message on his home number CODE STATUS is listed as full code this needs discussion again given advanced age, numerous current cardiopulmonary comorbidities, advanced CKD 4, possible intra-abdominal malignancy based on CT findings and recurrent cholecystitis, with metabolic encephalopathy at this time  Domenic Polite, MD

## 2019-09-14 NOTE — Progress Notes (Signed)
Pt had been NPO since midnight.

## 2019-09-14 NOTE — Progress Notes (Signed)
Received pt alert but very confused. Pt is very combative, kicking, and biting the staff. Pt did not want to lay in bed and states he wanted to go home. Pt can not be redirected at all. Placed pt in a low bed for safety. Safety sitter unavailble at this time. Will continue to monitor pt.

## 2019-09-14 NOTE — Progress Notes (Signed)
EKG done

## 2019-09-14 NOTE — ED Notes (Signed)
Spoke with pt staffing in regards to a sitter. No sitters available for tonight.

## 2019-09-14 NOTE — Progress Notes (Addendum)
Attempt made to reach patient's son Naheem Mosco regarding update as he requested this morning; No answer at this time.

## 2019-09-14 NOTE — ED Notes (Signed)
Pt agitated and confused, difficult to redirect, will not follow instructions, continuously tries to climb out of bed. Pt is a high fall risk. MD notified.

## 2019-09-15 ENCOUNTER — Inpatient Hospital Stay (HOSPITAL_COMMUNITY): Payer: Medicare Other

## 2019-09-15 ENCOUNTER — Telehealth: Payer: Self-pay

## 2019-09-15 ENCOUNTER — Telehealth: Payer: Self-pay | Admitting: Interventional Cardiology

## 2019-09-15 LAB — COMPREHENSIVE METABOLIC PANEL
ALT: 29 U/L (ref 0–44)
AST: 25 U/L (ref 15–41)
Albumin: 2.7 g/dL — ABNORMAL LOW (ref 3.5–5.0)
Alkaline Phosphatase: 96 U/L (ref 38–126)
Anion gap: 15 (ref 5–15)
BUN: 54 mg/dL — ABNORMAL HIGH (ref 8–23)
CO2: 24 mmol/L (ref 22–32)
Calcium: 9.5 mg/dL (ref 8.9–10.3)
Chloride: 108 mmol/L (ref 98–111)
Creatinine, Ser: 2.31 mg/dL — ABNORMAL HIGH (ref 0.61–1.24)
GFR calc Af Amer: 28 mL/min — ABNORMAL LOW (ref >60)
GFR calc non Af Amer: 24 mL/min — ABNORMAL LOW (ref >60)
Glucose, Bld: 233 mg/dL — ABNORMAL HIGH (ref 70–99)
Potassium: 4.2 mmol/L (ref 3.5–5.1)
Sodium: 147 mmol/L — ABNORMAL HIGH (ref 135–145)
Total Bilirubin: 1.2 mg/dL (ref 0.3–1.2)
Total Protein: 6.2 g/dL — ABNORMAL LOW (ref 6.5–8.1)

## 2019-09-15 LAB — GLUCOSE, CAPILLARY
Glucose-Capillary: 179 mg/dL — ABNORMAL HIGH (ref 70–99)
Glucose-Capillary: 191 mg/dL — ABNORMAL HIGH (ref 70–99)
Glucose-Capillary: 192 mg/dL — ABNORMAL HIGH (ref 70–99)
Glucose-Capillary: 202 mg/dL — ABNORMAL HIGH (ref 70–99)
Glucose-Capillary: 206 mg/dL — ABNORMAL HIGH (ref 70–99)
Glucose-Capillary: 215 mg/dL — ABNORMAL HIGH (ref 70–99)

## 2019-09-15 LAB — CBC
HCT: 30.8 % — ABNORMAL LOW (ref 39.0–52.0)
Hemoglobin: 10.5 g/dL — ABNORMAL LOW (ref 13.0–17.0)
MCH: 35 pg — ABNORMAL HIGH (ref 26.0–34.0)
MCHC: 34.1 g/dL (ref 30.0–36.0)
MCV: 102.7 fL — ABNORMAL HIGH (ref 80.0–100.0)
Platelets: 379 10*3/uL (ref 150–400)
RBC: 3 MIL/uL — ABNORMAL LOW (ref 4.22–5.81)
RDW: 22.6 % — ABNORMAL HIGH (ref 11.5–15.5)
WBC: 23.4 10*3/uL — ABNORMAL HIGH (ref 4.0–10.5)
nRBC: 0.3 % — ABNORMAL HIGH (ref 0.0–0.2)

## 2019-09-15 MED ORDER — POLYETHYLENE GLYCOL 3350 17 G PO PACK: 17.0000 g | PACK | Freq: Every day | ORAL | Status: DC | PRN

## 2019-09-15 MED ORDER — FERROUS SULFATE 325 (65 FE) MG PO TABS
325.0000 mg | ORAL_TABLET | Freq: Every day | ORAL | Status: DC
  Administered 2019-09-18 – 2019-09-21 (×4): 325 mg via ORAL
  Filled 2019-09-15 (×5): qty 1

## 2019-09-15 MED ORDER — HEPARIN SODIUM (PORCINE) 5000 UNIT/ML IJ SOLN
5000.0000 [IU] | Freq: Three times a day (TID) | INTRAMUSCULAR | Status: DC
  Administered 2019-09-15 – 2019-09-16 (×3): 5000 [IU] via SUBCUTANEOUS
  Filled 2019-09-15 (×3): qty 1

## 2019-09-15 MED ORDER — DEXTROSE IN LACTATED RINGERS 5 % IV SOLN: INTRAVENOUS | Status: DC

## 2019-09-15 MED ORDER — HYDROMORPHONE HCL 1 MG/ML IJ SOLN
0.5000 mg | Freq: Three times a day (TID) | INTRAMUSCULAR | Status: DC | PRN
  Administered 2019-09-17: 03:00:00 0.5 mg via INTRAVENOUS
  Filled 2019-09-15: qty 1

## 2019-09-15 MED ORDER — METOPROLOL SUCCINATE 12.5 MG HALF TABLET
12.5000 mg | ORAL_TABLET | Freq: Every day | ORAL | Status: DC
  Administered 2019-09-18 – 2019-09-20 (×2): 12.5 mg via ORAL
  Filled 2019-09-15 (×4): qty 1

## 2019-09-15 MED ORDER — SENNOSIDES-DOCUSATE SODIUM 8.6-50 MG PO TABS
2.0000 | ORAL_TABLET | Freq: Two times a day (BID) | ORAL | Status: DC
  Administered 2019-09-15 – 2019-09-21 (×12): 2 via ORAL
  Filled 2019-09-15 (×12): qty 2

## 2019-09-15 MED ORDER — OXYCODONE HCL 5 MG PO TABS
5.0000 mg | ORAL_TABLET | Freq: Four times a day (QID) | ORAL | Status: DC | PRN
  Administered 2019-09-15 – 2019-09-18 (×3): 5 mg via ORAL
  Filled 2019-09-15 (×3): qty 1

## 2019-09-15 NOTE — Progress Notes (Addendum)
Subjective: Awake.  Mumbles.  Does not appear to be in distress. Heart rate 80.  Temp 97 7.  WBC 23,000.  Hemoglobin 10.5.  Glucose 233.  Creatinine 2.31.  LFTs normal.  Albumin 2.7.  HIDA scan positive for nonvisualization of gallbladder. IR has seen and agrees with diagnosis of cholecystitis, but states gallbladder is contracted and not approachable for percutaneous drainage at this time.  To repeat ultrasound tomorrow to see if it is has become distended.  This is unfortunate  His son was here this morning and I had a long talk with him about care plan and goals.   Objective: Vital signs in last 24 hours: Temp:  [97.7 F (36.5 C)-99.2 F (37.3 C)] 97.7 F (36.5 C) (12/22 0421) Pulse Rate:  [57-79] 69 (12/22 0421) Resp:  [18-21] 18 (12/22 0421) BP: (98-111)/(42-76) 104/76 (12/22 0421) SpO2:  [90 %-99 %] 93 % (12/22 0421) Weight:  [71.7 kg] 71.7 kg (12/22 0428)    Intake/Output from previous day: 12/21 0701 - 12/22 0700 In: 400 [IV Piggyback:400] Out: 2200 [Urine:2200] Intake/Output this shift: No intake/output data recorded.   General appearance: Very feeble.  Mild pallor.  Skin warm and dry.    Mumbles.   Markedly deconditioned. Resp: clear to auscultation bilaterally GI: Abdomen is not distended.  No palpable mass.  Variable nonlocalizing tenderness.    Lab Results:  Recent Labs    09/14/19 0333 09/15/19 0156  WBC 28.3* 23.4*  HGB 9.0* 10.5*  HCT 26.5* 30.8*  PLT 378 379   BMET Recent Labs    09/14/19 0333 09/15/19 0156  NA 147* 147*  K 3.8 4.2  CL 109 108  CO2 23 24  GLUCOSE 127* 233*  BUN 60* 54*  CREATININE 2.30* 2.31*  CALCIUM 9.2 9.5   PT/INR Recent Labs    09/14/2019 1916  LABPROT 14.4  INR 1.1   ABG No results for input(s): PHART, HCO3 in the last 72 hours.  Invalid input(s): PCO2, PO2  Studies/Results: CT ABDOMEN PELVIS WO CONTRAST  Result Date: 08/27/2019 CLINICAL DATA:  Abdominal pain and fever. The patient has a history  of diabetes and prostate cancer. EXAM: CT ABDOMEN AND PELVIS WITHOUT CONTRAST TECHNIQUE: Multidetector CT imaging of the abdomen and pelvis was performed following the standard protocol without IV contrast. COMPARISON:  February 28, 2017 FINDINGS: Lower chest: There is a moderate sized partially visualized right-sided pleural effusion. There is a small left-sided pleural effusion.The heart size is enlarged. There is a trace pericardial effusion. The intracardiac blood pool is hypodense relative to the adjacent myocardium consistent with anemia. Atelectasis is noted at the lung bases bilaterally. Hepatobiliary: There is I a hypoattenuating mass in the right hepatic lobe measuring approximately 5.1 x 4.1 by 6.9 cm. This is new since the prior study in 2018. There is cholelithiasis with some mild gallbladder wall thickening.There is no biliary ductal dilation. Pancreas: Normal contours without ductal dilatation. No peripancreatic fluid collection. Spleen: No splenic laceration or hematoma. Adrenals/Urinary Tract: --Adrenal glands: No adrenal hemorrhage. --Right kidney/ureter: The right kidney is atrophic. --Left kidney/ureter: The left kidney is atrophic. --Urinary bladder: Unremarkable. Stomach/Bowel: --Stomach/Duodenum: No hiatal hernia or other gastric abnormality. Normal duodenal course and caliber. --Small bowel: No dilatation or inflammation. --Colon: Rectosigmoid diverticulosis without acute inflammation. --Appendix: Normal. Vascular/Lymphatic: Atherosclerotic calcification is present within the non-aneurysmal abdominal aorta, without hemodynamically significant stenosis. --No retroperitoneal lymphadenopathy. --No mesenteric lymphadenopathy. --No pelvic or inguinal lymphadenopathy. Reproductive: Unremarkable Other: No ascites or free air. The patient is status post  bilateral inguinal hernia repair. Musculoskeletal. No acute displaced fractures. IMPRESSION: 1. New hypoattenuating mass in the right hepatic lobe,  concerning for malignancy. This is suboptimally evaluated on this exam. Further evaluation with a contrast enhanced multiphase MRI or CT is recommended. 2. Bilateral pleural effusions, right greater than left. Given the above finding in the liver, consider further evaluation with a contrast enhanced CT of the chest. 3. Cardiomegaly and trace pericardial effusion. 4. Cholelithiasis with mild gallbladder wall thickening. If there is concern for acute cholecystitis, recommend further evaluation with right upper quadrant ultrasound. 5. Rectosigmoid diverticulosis without acute inflammation. Aortic Atherosclerosis (ICD10-I70.0). Electronically Signed   By: Constance Holster M.D.   On: 09/24/2019 21:23   CT Head Wo Contrast  Result Date: 09/12/2019 CLINICAL DATA:  85 year old male with altered mental status and increased weakness. EXAM: CT HEAD WITHOUT CONTRAST TECHNIQUE: Contiguous axial images were obtained from the base of the skull through the vertex without intravenous contrast. COMPARISON:  None. FINDINGS: Brain: No evidence of acute infarction, hemorrhage, hydrocephalus, extra-axial collection or mass lesion/mass effect. Atrophy, chronic small-vessel white matter ischemic changes and remote RIGHT cerebellar infarct noted. Vascular: Carotid and vertebral atherosclerotic calcifications noted Skull: No acute abnormality. Sinuses/Orbits: No acute abnormality Other: None IMPRESSION: 1. No evidence of acute intracranial abnormality. 2. Atrophy, chronic small-vessel white matter ischemic changes and remote RIGHT cerebellar infarct. Electronically Signed   By: Margarette Canada M.D.   On: 09/21/2019 16:00   NM Hepato W/EjeCT Fract  Result Date: 09/14/2019 CLINICAL DATA:  Fever EXAM: NUCLEAR MEDICINE HEPATOBILIARY IMAGING TECHNIQUE: Sequential images of the abdomen were obtained out to 60 minutes following intravenous administration of radiopharmaceutical. RADIOPHARMACEUTICALS:  5.2 mCi Tc-76m  Choletec IV COMPARISON:   Ultrasound 09/05/2019 FINDINGS: Prompt uptake and biliary excretion of activity by the liver. Activity passes into the small bowel compatible with patent common bile duct. There is non filling of the gallbladder. Due to the patient's hypotension, morphine was not administered. Delayed imaging to 2 hours was performed with continued non filling of the gallbladder. IMPRESSION: Non filling of the gallbladder despite delayed imaging out to 2 hours. Findings concerning for cystic duct obstruction. Common bile duct is patent. Electronically Signed   By: Rolm Baptise M.D.   On: 09/14/2019 15:58   DG Chest Port 1 View  Result Date: 09/23/2019 CLINICAL DATA:  Weakness EXAM: PORTABLE CHEST 1 VIEW COMPARISON:  09/04/2019 FINDINGS: Cardiomediastinal contours are stable accounting for portable technique and are partially obscured by graded opacity in the left and right chest. Signs of coronary stenting are noted along the left heart border. Increased interstitial markings are noted bilaterally without focal consolidation aside from obscuration of right hemidiaphragm. Spinal degenerative changes. Visualized skeletal structures otherwise unremarkable. IMPRESSION: 1. Probable new bilateral effusions and mild interstitial edema. Electronically Signed   By: Zetta Bills M.D.   On: 09/19/2019 15:33   US Abdomen Limited RUQ  Result Date: 09/12/2019 CLINICAL DATA:  Right upper quadrant pain EXAM: ULTRASOUND ABDOMEN LIMITED RIGHT UPPER QUADRANT COMPARISON:  CT same day FINDINGS: Gallbladder: There are multiple shadowing gallstones filling the gallbladder the largest measuring 1.8 cm. There is a diffusely thickened gallbladder wall measuring 6.3 mm. No sonographic Percell Miller sign is seen. Common bile duct: Diameter: 2 mm. Liver: There is a heterogeneous appearance to the liver parenchyma. The hypodense mass seen on CT same day is not well visualized on this exam. Portal vein is patent on color Doppler imaging with normal direction  of blood flow towards the liver. Other:  None. IMPRESSION: Cholelithiasis with diffuse gallbladder wall thickening which could be suggestive of acute cholecystitis. Heterogeneous liver parenchyma. The ill-defined mass on recent CT is not well visualized on this examination. Would recommend CT with contrast or MRI for further evaluation. Electronically Signed   By: Prudencio Pair M.D.   On: 09/04/2019 22:58    Anti-infectives: Anti-infectives (From admission, onward)   Start     Dose/Rate Route Frequency Ordered Stop   09/15/19 2030  vancomycin (VANCOCIN) IVPB 1000 mg/200 mL premix  Status:  Discontinued     1,000 mg 200 mL/hr over 60 Minutes Intravenous Every 48 hours 09/12/2019 1923 09/14/19 0315   09/14/19 1930  ceFEPIme (MAXIPIME) 2 g in sodium chloride 0.9 % 100 mL IVPB     2 g 200 mL/hr over 30 Minutes Intravenous Every 24 hours 09/12/2019 1921     09/14/19 0600  metroNIDAZOLE (FLAGYL) IVPB 500 mg     500 mg 100 mL/hr over 60 Minutes Intravenous Every 8 hours 09/14/19 0315     09/17/2019 1930  vancomycin (VANCOREADY) IVPB 1250 mg/250 mL     1,250 mg 166.7 mL/hr over 90 Minutes Intravenous  Once 09/12/2019 1921 09/01/2019 2304   09/23/2019 1900  ceFEPIme (MAXIPIME) 2 g in sodium chloride 0.9 % 100 mL IVPB     2 g 200 mL/hr over 30 Minutes Intravenous  Once 09/23/2019 1852 09/06/2019 2024   09/07/2019 1900  metroNIDAZOLE (FLAGYL) IVPB 500 mg     500 mg 100 mL/hr over 60 Minutes Intravenous  Once 08/29/2019 1852 09/19/2019 2041   09/20/2019 1900  vancomycin (VANCOCIN) IVPB 1000 mg/200 mL premix  Status:  Discontinued     1,000 mg 200 mL/hr over 60 Minutes Intravenous  Once 09/06/2019 1852 09/20/2019 1921      Assessment/Plan:   Acute cholecystitis.  This is recurrent.  Past history of percutaneous drainage. Ultrasound and HIDA scan consistent with acute cholecystitis Patient is a very poor surgical candidate and may need cholecystostomy tube On imaging gallbladder is contracted and this does not allow for  percutaneous access. Repeat ultrasound tomorrow in hopes that the gallbladder has become distended Continue to hold Plavix Continue antibiotics  Combined systolic and diastolic congestive heart failure Paroxysmal atrial fibrillation Plavix on hold Metabolic encephalopathy, acute on chronic Liver mass-this can be evaluated later, if desired. IDDM Anemia.  Hemoglobin 9.0.  No evidence of active bleeding CKD stage IV Goals of care.  Defer discussion with primary service.  Patient is very feeble and appears  in decline.   LOS: 2 days    Adin Hector 09/15/2019

## 2019-09-15 NOTE — Telephone Encounter (Signed)
Patient's son is calling stating the patient is in the hospital and they have diagnosed him with a gallbladder infection. He is wanting to know if the patient still needs to put on the heart monitor & if so should he wait until he is released from the hospital.

## 2019-09-15 NOTE — Progress Notes (Signed)
IR received request for perc chole drain Chart reviewed and pt has had perc chole twice previously. Last records show patient had patent duct on cholangiogram and drain was removed on 01/14/2018  He has now been admitted with recurrent pain. Imaging finds thickened gb with stones and non viz on HIDA c/w cholecystitis IR requested to place another perc chole drain Imaging reviewed with Dr. Kathlene Cote and although appears c/w cholecystitis, the gb is contracted and does not really allow for percutaneous access. Recommend keeping pt NPO today Will order another limited RUQ [place] for tomorrow am in hopes that there has been some gb distention in the interval, which will allow for perc drain placement. Cont holding Plavix.  Ascencion Dike PA-C Interventional Radiology 09/15/2019 8:26 AM

## 2019-09-15 NOTE — Telephone Encounter (Signed)
The pt son states his dad is in the hospital. He has an appointment with Dr. [name] in January and he do not think it is possible to have results on that day. I told him I will take a note and send it to the right department but he was irritated that the receptionist transferred him to me.

## 2019-09-15 NOTE — Progress Notes (Signed)
PROGRESS NOTE  Robert [name] XFG:[id number] DOB: 1928-01-02 DOA: 08/30/2019 PCP: [name], MD  HPI/Recap of past 81 hours: 85 year old male with past medical history significant for coronary artery disease status post PCI, ischemic cardiomyopathy with EF 35 to 40%, paroxysmal A. fib not on oral anticoagulation, mitral regurgitation, hyperlipidemia, cholecystitis in 2016 status post percutaneous drain which was removed in April 2019, CKD stage IV, prostate cancer status post radiation, PVD, type 2 diabetes, who presented to the ED [facility] via EMS from home due to altered mental status.  At baseline patient is alert and oriented.  Has had increased confusion in the past week.  Per EMS was found to be in A. fib with heart rate ranging between 80 and 120.  In the Ed, Febrile with T-max 100.9, WBC 20 K, lactic acid 2.1, T bili 1.4, CT abdomen pelvis showing new hypoattenuating mass in the right hepatic lobe concerning for malignancy.  Cardiomegaly and trace pericardial effusion.  Cholelithiasis and mild gallbladder wall thickening.  Right upper quadrant ultrasound showing cholelithiasis with diffuse gallbladder wall thickening concerning for acute cholecystitis.  Started on IV antibiotics empirically IV cefepime, IV Flagyl.  Admitted for recurrent cholecystitis.  General surgery and interventional radiology consulted and following.  Gallbladder contracted therefore not amenable for percutaneous access 12/22.  Right upper quadrant ultrasound will be repeated on 09/16/2019 in hopes that the gallbladder will be distended and amenable to percutaneous drain placement by IR.  09/15/19: Seen and examined.  Somnolent, mumbles nonsensical words.  Confused.    Assessment/Plan: Principal Problem:   Acute cholecystitis Active Problems:   Type II diabetes mellitus with renal manifestations (HCC)   CKD (chronic kidney disease), stage IV (HCC)   Sepsis ([place])   CHF exacerbation ([place])   Recurrent acute  calculus cholecystitis Treated a few years ago with a percutaneous cholecystostomy tube which was removed on April 2019 from 2016. Presented with fever with findings on CT abdomen pelvis suggestive of acute calculus cholecystitis HIDA scan completed, gallbladder contracted therefore not amenable for percutaneous access 12/22.   Right upper quadrant ultrasound will be repeated on 09/16/2019 in hopes that the gallbladder will be distended and amenable to percutaneous drain placement by IR. Continue IV antibiotics empirically on IV cefepime and IV Flagyl. WBC is trending down Monitor fever curve and WBC Blood cultures negative to date, continue to follow cultures. General surgery and interventional radiology following.  Acute metabolic encephalopathy likely in the setting of acute illness Reorient as needed Continue to treat underlying condition  CKD4 Baseline creatinine appears to be 2.1 with GFR of 26 Creatinine slightly trending up 2.31 Avoid nephrotoxins Monitor urine output Daily BMPs  Iron deficiency anemia Iron studies suggestive of iron deficiency Hemoglobin is stable Start ferrous sulfate 325 daily when no longer n.p.o. possibly 12/24  Paroxysmal A. fib Not on oral anticoagulation Followed by cardiology and has declined anticoagulation Was on Plavix for CVA prevention which is on hold due to possible procedure by IR. On metoprolol however on hold due to soft blood pressures  Type 2 diabetes Last A1c 6.5 on 08/03/19 Currently n.p.o. Avoid hypoglycemia, start LR D5w 30 ml/hr x1 day and closely monitor volume status  Ischemic cardiomyopathy/chronic systolic CHF with EF 35 to 40% Start strict I's and O's and daily weight Reviewed chest x-ray done on admission which showed small right pleural effusion Closely monitor volume status  Liver mass on CT abdomen pelvis Monitor  Chronic anxiety On Xanaz 0.25 mg PRN for anxiety  Hyperlipidemia  Statin on hold due to  n.p.o.  GERD Ranitidine on hold due to n.p.o. Start Protonix 40 mg daily IV.  DVT prophylaxis:  Subcu heparin 3 times daily  Code Status: Full code per ED provider's conversation with the patient's son.   Family Communication:  None at bedside.   Disposition Plan:  Patient is currently not appropriate for discharge at this time due to ongoing work-up and treatment of recurrent acute cholecystitis, persistent acute metabolic encephalopathy.  Patient requires at least 2 midnight for further evaluation and treatment of present condition.   Consults called: General surgery, interventional radiology.     Objective: Vitals:   09/14/19 1804 09/14/19 2238 09/15/19 0421 09/15/19 0428  BP:  (!) 110/50 104/76   Pulse:  (!) 57 69   Resp:  19 18   Temp: 99.2 F (37.3 C) 97.7 F (36.5 C) 97.7 F (36.5 C)   TempSrc:  Oral Oral   SpO2:  99% 93%   Weight:    71.7 kg    Intake/Output Summary (Last 24 hours) at 09/15/2019 1130 Last data filed at 09/15/2019 7517 Gross per 24 hour  Intake 400 ml  Output 2200 ml  Net -1800 ml   Filed Weights   09/15/19 0428  Weight: 71.7 kg    Exam:  . General: 85 y.o. year-old male frail-appearing somnolent but arousable.  Mumbles nonsensical words.   . Cardiovascular: Regular rate and rhythm with no rubs or gallops.  No thyromegaly or JVD noted.   Marland Kitchen Respiratory: Mild rales noted at bases.  No wheezing noted.  Poor inspiratory effort.   . Abdomen: Soft tenderness with palpation noted diffusely.  Bowel sounds present. . Musculoskeletal: No lower extremity edema. 2/4 pulses in all 4 extremities. Marland Kitchen Psychiatry: Mood is appropriate for condition and setting   Data Reviewed: CBC: Recent Labs  Lab 09/11/2019 1632 09/14/19 0333 09/15/19 0156  WBC 20.8* 28.3* 23.4*  HGB 7.8* 9.0* 10.5*  HCT 22.5* 26.5* 30.8*  MCV 103.2* 103.1* 102.7*  PLT 374 378 001   Basic Metabolic Panel: Recent Labs  Lab 09/09/2019 1543 09/14/19 0333 09/15/19 0156  NA  143 147* 147*  K 4.6 3.8 4.2  CL 111 109 108  CO2 20* 23 24  GLUCOSE 98 127* 233*  BUN 68* 60* 54*  CREATININE 2.19* 2.30* 2.31*  CALCIUM 8.6* 9.2 9.5  MG  --  2.4  --    GFR: Estimated Creatinine Clearance: 21.7 mL/min (A) (by C-G formula based on SCr of 2.31 mg/dL (H)). Liver Function Tests: Recent Labs  Lab 09/05/2019 1543 09/14/19 0333 09/15/19 0156  AST 39 22 25  ALT 30 30 29   ALKPHOS 95 95 96  BILITOT 1.4* 1.3* 1.2  PROT 5.5* 5.7* 6.2*  ALBUMIN 2.5* 2.5* 2.7*   Recent Labs  Lab 08/25/2019 1543  LIPASE 38   No results for input(s): AMMONIA in the last 168 hours. Coagulation Profile: Recent Labs  Lab 08/29/2019 1916  INR 1.1   Cardiac Enzymes: No results for input(s): CKTOTAL, CKMB, CKMBINDEX, TROPONINI in the last 168 hours. BNP (last 3 results) No results for input(s): PROBNP in the last 8760 hours. HbA1C: No results for input(s): HGBA1C in the last 72 hours. CBG: Recent Labs  Lab 09/14/19 1540 09/14/19 1955 09/14/19 2347 09/15/19 0413 09/15/19 0803  GLUCAP 220* 214* 175* 215* 192*   Lipid Profile: No results for input(s): CHOL, HDL, LDLCALC, TRIG, CHOLHDL, LDLDIRECT in the last 72 hours. Thyroid Function Tests: No results for input(s): TSH,  T4TOTAL, FREET4, T3FREE, THYROIDAB in the last 72 hours. Anemia Panel: Recent Labs    09/14/19 0333  VITAMINB12 2,124*  FOLATE 46.8  FERRITIN 919*  TIBC 189*  IRON 22*  RETICCTPCT 1.4   Urine analysis:    Component Value Date/Time   COLORURINE YELLOW 09/11/2019 1503   APPEARANCEUR HAZY (A) 09/01/2019 1503   LABSPEC 1.013 09/17/2019 1503   PHURINE 5.0 09/16/2019 1503   GLUCOSEU >=500 (A) 09/12/2019 1503   GLUCOSEU NEGATIVE 08/19/2018 0855   HGBUR NEGATIVE 09/06/2019 1503   BILIRUBINUR NEGATIVE 08/31/2019 1503   KETONESUR NEGATIVE 09/06/2019 1503   PROTEINUR NEGATIVE 08/26/2019 1503   UROBILINOGEN 0.2 08/19/2018 0855   NITRITE NEGATIVE 09/18/2019 1503   LEUKOCYTESUR NEGATIVE 09/03/2019 1503    Sepsis Labs: @LABRCNTIP (procalcitonin:4,lacticidven:4)  ) Recent Results (from the past 240 hour(s))  Blood culture (routine x 2)     Status: None (Preliminary result)   Collection Time: 09/18/2019  4:32 PM   Specimen: BLOOD RIGHT ARM  Result Value Ref Range Status   Specimen Description BLOOD RIGHT ARM  Final   Special Requests   Final    BOTTLES DRAWN AEROBIC AND ANAEROBIC Blood Culture results may not be optimal due to an inadequate volume of blood received in culture bottles   Culture   Final    NO GROWTH 2 DAYS Performed at [facility], [place] [address], [place], [place] [address]    Report Status PENDING  Incomplete  Blood culture (routine x 2)     Status: None (Preliminary result)   Collection Time: 09/16/2019  7:10 PM   Specimen: BLOOD  Result Value Ref Range Status   Specimen Description BLOOD LEFT ANTECUBITAL  Final   Special Requests   Final    BOTTLES DRAWN AEROBIC AND ANAEROBIC Blood Culture adequate volume   Culture   Final    NO GROWTH 2 DAYS Performed at [facility], [place] [address], [place], [place] [address]    Report Status PENDING  Incomplete  Respiratory Panel by RT PCR (Flu A&B, Covid) - Nasopharyngeal Swab     Status: None   Collection Time: 09/07/2019  8:10 PM   Specimen: Nasopharyngeal Swab  Result Value Ref Range Status   SARS Coronavirus 2 by RT PCR NEGATIVE NEGATIVE Final    Comment: (NOTE) SARS-CoV-2 target nucleic acids are NOT DETECTED. The SARS-CoV-2 RNA is generally detectable in upper respiratoy specimens during the acute phase of infection. The lowest concentration of SARS-CoV-2 viral copies this assay can detect is 131 copies/mL. A negative result does not preclude SARS-Cov-2 infection and should not be used as the sole basis for treatment or other patient management decisions. A negative result may occur with  improper specimen collection/handling, submission of specimen other than nasopharyngeal swab, presence of viral  mutation(s) within the areas targeted by this assay, and inadequate number of viral copies (<131 copies/mL). A negative result must be combined with clinical observations, patient history, and epidemiological information. The expected result is Negative. Fact Sheet for Patients:  PinkCheek.be Fact Sheet for Healthcare Providers:  GravelBags.it This test is not yet ap proved or cleared by the [place] FDA and  has been authorized for detection and/or diagnosis of SARS-CoV-2 by FDA under an Emergency Use Authorization (EUA). This EUA will remain  in effect (meaning this test can be used) for the duration of the COVID-19 declaration under Section 564(b)(1) of the Act, 21 U.S.C. section 360bbb-3(b)(1), unless the authorization is terminated or revoked sooner.  Influenza A by PCR NEGATIVE NEGATIVE Final   Influenza B by PCR NEGATIVE NEGATIVE Final    Comment: (NOTE) The Xpert Xpress SARS-CoV-2/FLU/RSV assay is intended as an aid in  the diagnosis of influenza from Nasopharyngeal swab specimens and  should not be used as a sole basis for treatment. Nasal washings and  aspirates are unacceptable for Xpert Xpress SARS-CoV-2/FLU/RSV  testing. Fact Sheet for Patients: PinkCheek.be Fact Sheet for Healthcare Providers: GravelBags.it This test is not yet approved or cleared by the [place] FDA and  has been authorized for detection and/or diagnosis of SARS-CoV-2 by  FDA under an Emergency Use Authorization (EUA). This EUA will remain  in effect (meaning this test can be used) for the duration of the  Covid-19 declaration under Section 564(b)(1) of the Act, 21  U.S.C. section 360bbb-3(b)(1), unless the authorization is  terminated or revoked. Performed at [facility], [place] [address], [place], [place] [address]       Studies: NM Hepato W/EjeCT  Fract  Result Date: 09/14/2019 CLINICAL DATA:  Fever EXAM: NUCLEAR MEDICINE HEPATOBILIARY IMAGING TECHNIQUE: Sequential images of the abdomen were obtained out to 60 minutes following intravenous administration of radiopharmaceutical. RADIOPHARMACEUTICALS:  5.2 mCi Tc-1m  Choletec IV COMPARISON:  Ultrasound 09/16/2019 FINDINGS: Prompt uptake and biliary excretion of activity by the liver. Activity passes into the small bowel compatible with patent common bile duct. There is non filling of the gallbladder. Due to the patient's hypotension, morphine was not administered. Delayed imaging to 2 hours was performed with continued non filling of the gallbladder. IMPRESSION: Non filling of the gallbladder despite delayed imaging out to 2 hours. Findings concerning for cystic duct obstruction. Common bile duct is patent. Electronically Signed   By: Rolm Baptise M.D.   On: 09/14/2019 15:58    Scheduled Meds: . insulin aspart  0-9 Units Subcutaneous Q4H  . [START ON 09/17/2019] metoprolol succinate  12.5 mg Oral Daily  . senna-docusate  2 tablet Oral BID    Continuous Infusions: . ceFEPime (MAXIPIME) IV Stopped (09/14/19 2018)  . dextrose 5% lactated ringers    . metronidazole Stopped (09/15/19 5284)     LOS: 2 days     Kayleen Memos, MD [facility] Pager [phone]  If 7PM-7AM, please contact night-coverage [email or web address] Password [email or web address] 09/15/2019, 11:30 AM

## 2019-09-16 ENCOUNTER — Other Ambulatory Visit (HOSPITAL_COMMUNITY): Payer: Medicare Other

## 2019-09-16 ENCOUNTER — Inpatient Hospital Stay (HOSPITAL_COMMUNITY): Payer: Medicare Other

## 2019-09-16 DIAGNOSIS — R160 Hepatomegaly, not elsewhere classified: Secondary | ICD-10-CM

## 2019-09-16 DIAGNOSIS — E1121 Type 2 diabetes mellitus with diabetic nephropathy: Secondary | ICD-10-CM

## 2019-09-16 DIAGNOSIS — K819 Cholecystitis, unspecified: Secondary | ICD-10-CM

## 2019-09-16 DIAGNOSIS — I480 Paroxysmal atrial fibrillation: Secondary | ICD-10-CM

## 2019-09-16 DIAGNOSIS — E870 Hyperosmolality and hypernatremia: Secondary | ICD-10-CM

## 2019-09-16 DIAGNOSIS — K820 Obstruction of gallbladder: Secondary | ICD-10-CM

## 2019-09-16 DIAGNOSIS — G9341 Metabolic encephalopathy: Secondary | ICD-10-CM

## 2019-09-16 HISTORY — PX: IR PERC CHOLECYSTOSTOMY: IMG2326

## 2019-09-16 LAB — BASIC METABOLIC PANEL
Anion gap: 9 (ref 5–15)
BUN: 47 mg/dL — ABNORMAL HIGH (ref 8–23)
CO2: 23 mmol/L (ref 22–32)
Calcium: 9.2 mg/dL (ref 8.9–10.3)
Chloride: 115 mmol/L — ABNORMAL HIGH (ref 98–111)
Creatinine, Ser: 2.15 mg/dL — ABNORMAL HIGH (ref 0.61–1.24)
GFR calc Af Amer: 31 mL/min — ABNORMAL LOW (ref >60)
GFR calc non Af Amer: 26 mL/min — ABNORMAL LOW (ref >60)
Glucose, Bld: 201 mg/dL — ABNORMAL HIGH (ref 70–99)
Potassium: 3.7 mmol/L (ref 3.5–5.1)
Sodium: 147 mmol/L — ABNORMAL HIGH (ref 135–145)

## 2019-09-16 LAB — GLUCOSE, CAPILLARY
Glucose-Capillary: 187 mg/dL — ABNORMAL HIGH (ref 70–99)
Glucose-Capillary: 169 mg/dL — ABNORMAL HIGH (ref 70–99)
Glucose-Capillary: 177 mg/dL — ABNORMAL HIGH (ref 70–99)
Glucose-Capillary: 209 mg/dL — ABNORMAL HIGH (ref 70–99)
Glucose-Capillary: 315 mg/dL — ABNORMAL HIGH (ref 70–99)

## 2019-09-16 LAB — CBC
HCT: 26.5 % — ABNORMAL LOW (ref 39.0–52.0)
Hemoglobin: 9.2 g/dL — ABNORMAL LOW (ref 13.0–17.0)
MCH: 35.1 pg — ABNORMAL HIGH (ref 26.0–34.0)
MCHC: 34.7 g/dL (ref 30.0–36.0)
MCV: 101.1 fL — ABNORMAL HIGH (ref 80.0–100.0)
Platelets: 349 10*3/uL (ref 150–400)
RBC: 2.62 MIL/uL — ABNORMAL LOW (ref 4.22–5.81)
RDW: 22.4 % — ABNORMAL HIGH (ref 11.5–15.5)
WBC: 17.3 10*3/uL — ABNORMAL HIGH (ref 4.0–10.5)
nRBC: 0.2 % (ref 0.0–0.2)

## 2019-09-16 MED ORDER — FENTANYL CITRATE (PF) 100 MCG/2ML IJ SOLN
INTRAMUSCULAR | Status: AC | PRN
  Administered 2019-09-16: 25 ug via INTRAVENOUS

## 2019-09-16 MED ORDER — INSULIN ASPART 100 UNIT/ML ~~LOC~~ SOLN
0.0000 [IU] | Freq: Three times a day (TID) | SUBCUTANEOUS | Status: DC
  Administered 2019-09-17: 17:00:00 3 [IU] via SUBCUTANEOUS
  Administered 2019-09-17: 11:00:00 7 [IU] via SUBCUTANEOUS
  Administered 2019-09-18: 17:00:00 2 [IU] via SUBCUTANEOUS
  Administered 2019-09-18: 09:00:00 3 [IU] via SUBCUTANEOUS
  Administered 2019-09-18: 13:00:00 5 [IU] via SUBCUTANEOUS
  Administered 2019-09-19: 08:00:00 2 [IU] via SUBCUTANEOUS
  Administered 2019-09-19: 1 [IU] via SUBCUTANEOUS
  Administered 2019-09-19 – 2019-09-20 (×2): 2 [IU] via SUBCUTANEOUS
  Administered 2019-09-20: 17:00:00 3 [IU] via SUBCUTANEOUS
  Administered 2019-09-20 – 2019-09-21 (×2): 1 [IU] via SUBCUTANEOUS
  Administered 2019-09-21: 13:00:00 2 [IU] via SUBCUTANEOUS
  Administered 2019-09-21: 08:00:00 1 [IU] via SUBCUTANEOUS

## 2019-09-16 MED ORDER — SODIUM CHLORIDE 0.9% FLUSH
5.0000 mL | Freq: Three times a day (TID) | INTRAVENOUS | Status: DC
  Administered 2019-09-16 – 2019-09-21 (×14): 5 mL

## 2019-09-16 MED ORDER — SODIUM CHLORIDE 0.9 % IV SOLN
INTRAVENOUS | Status: AC | PRN
  Administered 2019-09-16: 10 mL/h via INTRAVENOUS

## 2019-09-16 MED ORDER — LIDOCAINE HCL (PF) 1 % IJ SOLN
INTRAMUSCULAR | Status: AC | PRN
  Administered 2019-09-16: 5 mL

## 2019-09-16 MED ORDER — IOHEXOL 300 MG/ML  SOLN
50.0000 mL | Freq: Once | INTRAMUSCULAR | Status: AC | PRN
  Administered 2019-09-16: 12:00:00 10 mL

## 2019-09-16 MED ORDER — MIDAZOLAM HCL 2 MG/2ML IJ SOLN
INTRAMUSCULAR | Status: AC
  Filled 2019-09-16: qty 2

## 2019-09-16 MED ORDER — HYDROCODONE-ACETAMINOPHEN 5-325 MG PO TABS
1.0000 | ORAL_TABLET | ORAL | Status: DC | PRN
  Administered 2019-09-19: 21:00:00 1 via ORAL
  Filled 2019-09-16: qty 1

## 2019-09-16 MED ORDER — MIDAZOLAM HCL 2 MG/2ML IJ SOLN
INTRAMUSCULAR | Status: AC | PRN
  Administered 2019-09-16: 0.5 mg via INTRAVENOUS

## 2019-09-16 MED ORDER — FENTANYL CITRATE (PF) 100 MCG/2ML IJ SOLN
INTRAMUSCULAR | Status: AC
  Filled 2019-09-16: qty 2

## 2019-09-16 MED ORDER — LIDOCAINE HCL 1 % IJ SOLN
INTRAMUSCULAR | Status: AC
  Filled 2019-09-16: qty 20

## 2019-09-16 MED ORDER — DEXTROSE-NACL 5-0.45 % IV SOLN: INTRAVENOUS | Status: DC

## 2019-09-16 MED ORDER — HEPARIN SODIUM (PORCINE) 5000 UNIT/ML IJ SOLN
5000.0000 [IU] | Freq: Three times a day (TID) | INTRAMUSCULAR | Status: DC
  Administered 2019-09-17 – 2019-09-21 (×15): 5000 [IU] via SUBCUTANEOUS
  Filled 2019-09-16 (×15): qty 1

## 2019-09-16 NOTE — Progress Notes (Signed)
[facility]  PROGRESS NOTE  Robert [name] TGP:[id number] DOB: 1928/02/13 DOA: 09/16/2019 PCP: [name], MD Admit date - 09/11/2019   Admitting Physician Shela Leff, MD  Outpatient Primary MD for the patient is [name], MD  LOS - 3 Brief Narrative   Robert [name] is a 85 y.o. year old male with medical history significant for coronary artery disease status post PCI, ischemic cardiomyopathy with EF 35 to 40%, paroxysmal A. fib not on oral anticoagulation, mitral regurgitation, hyperlipidemia, cholecystitis in 2016 status post percutaneous drain which was removed in April 2019, CKD stage IV, prostate cancer status post radiation, PVD, type 2 diabetes who presented on 09/11/2019 with confusion and was found to have fever, white count, lactic acidosis, CT consistent with right hepatic lobe mass concerning for malignancy, mild gallbladder wall thickening.  Patient was admitted with a diagnosis of acute cholecystitis.  Empirically started on IV cefepime, Flagyl. Hospital course complicated by gallbladder contracted therefore not amenable for percutaneous access.  Patient underwent IR guided cholecystostomy on 12/23.    Subjective  Robert [name] today has just returned from receiving his cholecystostomy tube. Has no acute complaints.  A & P   Recurrent acute calculus cholecystitis, status post cholecystostomy placement 12/23 HIDA scan reviewed obstructed cystic duct, patient is a very poor surgical candidate Last cholecystostomy General no 12/2016 Remains afebrile, leukocytosis downtrending -Continue to monitor output from cholecystostomy tube -Continue empiric cefepime and Flagyl   Right hepatic lobe mass, concerning for malignancy MRI liver without contrast shows hyperintense lesion in the central liver, unable to further characterize given lack of IV contrast -Tissue sampling may be warranted  Combined systolic and diastolic CHF, euvolemic on  exam -Holding home Lasix -Monitor daily weights, volume status  Paroxysmal atrial fibrillation Followed by cardiology has declined anticoagulation in the past -Plavix on hold -Continue metoprolol at reduced dose of 12.5 mg  Acute metabolic encephalopathy Likely related to infection above, hypernatremia likely also contributing Alert to self currently -Continue close monitor  Hypernatremia, related to diminished p.o. intake in setting of n.p.o. status -Allow soft diet -Continue D5 half-normal saline-monitor BMP  CKD stage IV, stable Baseline creatinine around 2.1 -avoid nephrotoxins, monitor urine output, daily BMP  Macrocytic anemia, chronic hemoglobin stable at baseline 9-10  Type 2 diabetes A1c 6.5 (07/2019) Home regimen includes: Lantus 16 units daily, lispro 9 units 3 times daily -Holding home regimen given n.p.o. status, allowed to rest the soft diet as well as mental status is okay -On D5 half-normal saline in meantime  Hyperlipidemia Holding home statin  Goals of care   Family Communication  : Son not at bedside, will update in a.m.  Code Status : Full  Disposition Plan  : IV cefepime, Flagyl, close monitoring of abdominal exam with percutaneous drain in place  Consults  : General surgery, IR  Procedures  :  12/20 right upper quadrant ultrasound-suggestive of acute cholecystitis, diffuse gallbladder wall thickening,  12/21 HIDA scan nonfilling of the gallbladder despite delayed imaging, concerning for cystic duct obstruction, common bile duct patent 12/23 percutaneous cholecystostomy tube placed by IR  DVT Prophylaxis  : Heparin  Lab Results  Component Value Date   PLT 349 09/16/2019    Diet :  Diet Order            DIET SOFT Room service appropriate? Yes with Assist; Fluid consistency: Thin  Diet effective now               Inpatient Medications Scheduled Meds: .  fentaNYL      . [START ON 09/17/2019] ferrous sulfate  325 mg Oral Q  breakfast  . [START ON 09/17/2019] heparin injection (subcutaneous)  5,000 Units Subcutaneous Q8H  . insulin aspart  0-9 Units Subcutaneous Q4H  . lidocaine      . [START ON 09/17/2019] metoprolol succinate  12.5 mg Oral Daily  . midazolam      . senna-docusate  2 tablet Oral BID  . sodium chloride flush  5 mL Intracatheter Q8H   Continuous Infusions: . ceFEPime (MAXIPIME) IV 2 g (09/16/19 2017)  . dextrose 5 % and 0.45% NaCl 75 mL/hr at 09/16/19 1600  . metronidazole Stopped (09/16/19 1531)   PRN Meds:.acetaminophen **OR** acetaminophen, HYDROcodone-acetaminophen, HYDROmorphone (DILAUDID) injection, LORazepam, oxyCODONE, polyethylene glycol  Antibiotics  :   Anti-infectives (From admission, onward)   Start     Dose/Rate Route Frequency Ordered Stop   09/15/19 2030  vancomycin (VANCOCIN) IVPB 1000 mg/200 mL premix  Status:  Discontinued     1,000 mg 200 mL/hr over 60 Minutes Intravenous Every 48 hours 09/11/2019 1923 09/14/19 0315   09/14/19 1930  ceFEPIme (MAXIPIME) 2 g in sodium chloride 0.9 % 100 mL IVPB     2 g 200 mL/hr over 30 Minutes Intravenous Every 24 hours 09/05/2019 1921     09/14/19 0600  metroNIDAZOLE (FLAGYL) IVPB 500 mg     500 mg 100 mL/hr over 60 Minutes Intravenous Every 8 hours 09/14/19 0315     09/19/2019 1930  vancomycin (VANCOREADY) IVPB 1250 mg/250 mL     1,250 mg 166.7 mL/hr over 90 Minutes Intravenous  Once 08/30/2019 1921 09/12/2019 2304   08/27/2019 1900  ceFEPIme (MAXIPIME) 2 g in sodium chloride 0.9 % 100 mL IVPB     2 g 200 mL/hr over 30 Minutes Intravenous  Once 09/18/2019 1852 09/18/2019 2024   08/30/2019 1900  metroNIDAZOLE (FLAGYL) IVPB 500 mg     500 mg 100 mL/hr over 60 Minutes Intravenous  Once 09/15/2019 1852 09/19/2019 2041   09/02/2019 1900  vancomycin (VANCOCIN) IVPB 1000 mg/200 mL premix  Status:  Discontinued     1,000 mg 200 mL/hr over 60 Minutes Intravenous  Once 09/20/2019 1852 09/12/2019 1921       Objective   Vitals:   09/16/19 1130 09/16/19 1135  09/16/19 1210 09/16/19 1501  BP: (!) 122/58 122/72 115/65 106/72  Pulse: 62 63 68 74  Resp: (!) 23 (!) 24 (!) 22 20  Temp:    98.2 F (36.8 C)  TempSrc:    Oral  SpO2: 100% 99% 99% 98%  Weight:        SpO2: 98 % O2 Flow Rate (L/min): 2 L/min  Wt Readings from Last 3 Encounters:  09/16/19 71.7 kg  09/05/19 67 kg  08/03/19 70.7 kg     Intake/Output Summary (Last 24 hours) at 09/16/2019 2228 Last data filed at 09/16/2019 1600 Gross per 24 hour  Intake 892.07 ml  Output 400 ml  Net 492.07 ml    Physical Exam:  Awake Alert, oriented to self only No new F.N deficits,  [place].AT, Symmetrical Chest wall movement, Good air movement bilaterally, CTAB Cholecystostomy tube in place draining bilious fluid, no abdominal tenderness with palpation, diminished bowel sounds RRR,No Gallops,Rubs or new Murmurs,    I have personally reviewed the following:   Data Reviewed:  CBC Recent Labs  Lab 08/26/2019 1632 09/14/19 0333 09/15/19 0156 09/16/19 0228  WBC 20.8* 28.3* 23.4* 17.3*  HGB 7.8* 9.0* 10.5* 9.2*  HCT 22.5* 26.5* 30.8* 26.5*  PLT 374 378 379 349  MCV 103.2* 103.1* 102.7* 101.1*  MCH 35.8* 35.0* 35.0* 35.1*  MCHC 34.7 34.0 34.1 34.7  RDW 23.0* 23.1* 22.6* 22.4*    Chemistries  Recent Labs  Lab 09/20/2019 1543 09/14/19 0333 09/15/19 0156 09/16/19 0228  NA 143 147* 147* 147*  K 4.6 3.8 4.2 3.7  CL 111 109 108 115*  CO2 20* 23 24 23   GLUCOSE 98 127* 233* 201*  BUN 68* 60* 54* 47*  CREATININE 2.19* 2.30* 2.31* 2.15*  CALCIUM 8.6* 9.2 9.5 9.2  MG  --  2.4  --   --   AST 39 22 25  --   ALT 30 30 29   --   ALKPHOS 95 95 96  --   BILITOT 1.4* 1.3* 1.2  --    ------------------------------------------------------------------------------------------------------------------ No results for input(s): CHOL, HDL, LDLCALC, TRIG, CHOLHDL, LDLDIRECT in the last 72 hours.  Lab Results  Component Value Date   HGBA1C 6.5 (A) 08/03/2019    ------------------------------------------------------------------------------------------------------------------ No results for input(s): TSH, T4TOTAL, T3FREE, THYROIDAB in the last 72 hours.  Invalid input(s): FREET3 ------------------------------------------------------------------------------------------------------------------ Recent Labs    09/14/19 0333  VITAMINB12 2,124*  FOLATE 46.8  FERRITIN 919*  TIBC 189*  IRON 22*  RETICCTPCT 1.4    Coagulation profile Recent Labs  Lab 08/25/2019 1916  INR 1.1    No results for input(s): DDIMER in the last 72 hours.  Cardiac Enzymes No results for input(s): CKMB, TROPONINI, MYOGLOBIN in the last 168 hours.  Invalid input(s): CK ------------------------------------------------------------------------------------------------------------------    Component Value Date/Time   BNP 874.0 (H) 09/14/2019 0277    Micro Results Recent Results (from the past 240 hour(s))  Blood culture (routine x 2)     Status: None (Preliminary result)   Collection Time: 08/31/2019  4:32 PM   Specimen: BLOOD RIGHT ARM  Result Value Ref Range Status   Specimen Description BLOOD RIGHT ARM  Final   Special Requests   Final    BOTTLES DRAWN AEROBIC AND ANAEROBIC Blood Culture results may not be optimal due to an inadequate volume of blood received in culture bottles   Culture   Final    NO GROWTH 3 DAYS Performed at [facility], [place] [address], [place], [place] [address]    Report Status PENDING  Incomplete  Blood culture (routine x 2)     Status: None (Preliminary result)   Collection Time: 09/14/2019  7:10 PM   Specimen: BLOOD  Result Value Ref Range Status   Specimen Description BLOOD LEFT ANTECUBITAL  Final   Special Requests   Final    BOTTLES DRAWN AEROBIC AND ANAEROBIC Blood Culture adequate volume   Culture   Final    NO GROWTH 3 DAYS Performed at [facility], [place] [address], [place], [place] [address]    Report Status  PENDING  Incomplete  Respiratory Panel by RT PCR (Flu A&B, Covid) - Nasopharyngeal Swab     Status: None   Collection Time: 09/10/2019  8:10 PM   Specimen: Nasopharyngeal Swab  Result Value Ref Range Status   SARS Coronavirus 2 by RT PCR NEGATIVE NEGATIVE Final    Comment: (NOTE) SARS-CoV-2 target nucleic acids are NOT DETECTED. The SARS-CoV-2 RNA is generally detectable in upper respiratoy specimens during the acute phase of infection. The lowest concentration of SARS-CoV-2 viral copies this assay can detect is 131 copies/mL. A negative result does not preclude SARS-Cov-2 infection and should not  be used as the sole basis for treatment or other patient management decisions. A negative result may occur with  improper specimen collection/handling, submission of specimen other than nasopharyngeal swab, presence of viral mutation(s) within the areas targeted by this assay, and inadequate number of viral copies (<131 copies/mL). A negative result must be combined with clinical observations, patient history, and epidemiological information. The expected result is Negative. Fact Sheet for Patients:  PinkCheek.be Fact Sheet for Healthcare Providers:  GravelBags.it This test is not yet ap proved or cleared by the [place] FDA and  has been authorized for detection and/or diagnosis of SARS-CoV-2 by FDA under an Emergency Use Authorization (EUA). This EUA will remain  in effect (meaning this test can be used) for the duration of the COVID-19 declaration under Section 564(b)(1) of the Act, 21 U.S.C. section 360bbb-3(b)(1), unless the authorization is terminated or revoked sooner.    Influenza A by PCR NEGATIVE NEGATIVE Final   Influenza B by PCR NEGATIVE NEGATIVE Final    Comment: (NOTE) The Xpert Xpress SARS-CoV-2/FLU/RSV assay is intended as an aid in  the diagnosis of influenza from Nasopharyngeal swab specimens and  should not  be used as a sole basis for treatment. Nasal washings and  aspirates are unacceptable for Xpert Xpress SARS-CoV-2/FLU/RSV  testing. Fact Sheet for Patients: PinkCheek.be Fact Sheet for Healthcare Providers: GravelBags.it This test is not yet approved or cleared by the [place] FDA and  has been authorized for detection and/or diagnosis of SARS-CoV-2 by  FDA under an Emergency Use Authorization (EUA). This EUA will remain  in effect (meaning this test can be used) for the duration of the  Covid-19 declaration under Section 564(b)(1) of the Act, 21  U.S.C. section 360bbb-3(b)(1), unless the authorization is  terminated or revoked. Performed at [facility], [place] [address] [place], [place] [address]   Aerobic/Anaerobic Culture (surgical/deep wound)     Status: None (Preliminary result)   Collection Time: 09/16/19 11:44 AM   Specimen: Wound; Bile  Result Value Ref Range Status   Specimen Description WOUND  Final   Special Requests NONE  Final   Gram Stain   Final    FEW WBC PRESENT, PREDOMINANTLY PMN MODERATE GRAM POSITIVE COCCI IN PAIRS IN CHAINS FEW GRAM POSITIVE RODS FEW GRAM NEGATIVE RODS Performed at [facility], [place] [address], [place],  [address]    Culture PENDING  Incomplete   Report Status PENDING  Incomplete    Radiology Reports CT ABDOMEN PELVIS WO CONTRAST  Result Date: 09/17/2019 CLINICAL DATA:  Abdominal pain and fever. The patient has a history of diabetes and prostate cancer. EXAM: CT ABDOMEN AND PELVIS WITHOUT CONTRAST TECHNIQUE: Multidetector CT imaging of the abdomen and pelvis was performed following the standard protocol without IV contrast. COMPARISON:  February 28, 2017 FINDINGS: Lower chest: There is a moderate sized partially visualized right-sided pleural effusion. There is a small left-sided pleural effusion.The heart size is enlarged. There is a trace pericardial effusion. The  intracardiac blood pool is hypodense relative to the adjacent myocardium consistent with anemia. Atelectasis is noted at the lung bases bilaterally. Hepatobiliary: There is I a hypoattenuating mass in the right hepatic lobe measuring approximately 5.1 x 4.1 by 6.9 cm. This is new since the prior study in 2018. There is cholelithiasis with some mild gallbladder wall thickening.There is no biliary ductal dilation. Pancreas: Normal contours without ductal dilatation. No peripancreatic fluid collection. Spleen: No splenic laceration or hematoma. Adrenals/Urinary Tract: --Adrenal glands:  No adrenal hemorrhage. --Right kidney/ureter: The right kidney is atrophic. --Left kidney/ureter: The left kidney is atrophic. --Urinary bladder: Unremarkable. Stomach/Bowel: --Stomach/Duodenum: No hiatal hernia or other gastric abnormality. Normal duodenal course and caliber. --Small bowel: No dilatation or inflammation. --Colon: Rectosigmoid diverticulosis without acute inflammation. --Appendix: Normal. Vascular/Lymphatic: Atherosclerotic calcification is present within the non-aneurysmal abdominal aorta, without hemodynamically significant stenosis. --No retroperitoneal lymphadenopathy. --No mesenteric lymphadenopathy. --No pelvic or inguinal lymphadenopathy. Reproductive: Unremarkable Other: No ascites or free air. The patient is status post bilateral inguinal hernia repair. Musculoskeletal. No acute displaced fractures. IMPRESSION: 1. New hypoattenuating mass in the right hepatic lobe, concerning for malignancy. This is suboptimally evaluated on this exam. Further evaluation with a contrast enhanced multiphase MRI or CT is recommended. 2. Bilateral pleural effusions, right greater than left. Given the above finding in the liver, consider further evaluation with a contrast enhanced CT of the chest. 3. Cardiomegaly and trace pericardial effusion. 4. Cholelithiasis with mild gallbladder wall thickening. If there is concern for acute  cholecystitis, recommend further evaluation with right upper quadrant ultrasound. 5. Rectosigmoid diverticulosis without acute inflammation. Aortic Atherosclerosis (ICD10-I70.0). Electronically Signed   By: Constance Holster M.D.   On: 09/07/2019 21:23   CT Head Wo Contrast  Result Date: 08/25/2019 CLINICAL DATA:  85 year old male with altered mental status and increased weakness. EXAM: CT HEAD WITHOUT CONTRAST TECHNIQUE: Contiguous axial images were obtained from the base of the skull through the vertex without intravenous contrast. COMPARISON:  None. FINDINGS: Brain: No evidence of acute infarction, hemorrhage, hydrocephalus, extra-axial collection or mass lesion/mass effect. Atrophy, chronic small-vessel white matter ischemic changes and remote RIGHT cerebellar infarct noted. Vascular: Carotid and vertebral atherosclerotic calcifications noted Skull: No acute abnormality. Sinuses/Orbits: No acute abnormality Other: None IMPRESSION: 1. No evidence of acute intracranial abnormality. 2. Atrophy, chronic small-vessel white matter ischemic changes and remote RIGHT cerebellar infarct. Electronically Signed   By: Margarette Canada M.D.   On: 09/14/2019 16:00   Robert LIVER WO CONRTAST  Result Date: 09/15/2019 CLINICAL DATA:  Liver mass. EXAM: MRI ABDOMEN WITHOUT CONTRAST TECHNIQUE: Multiplanar multisequence Robert imaging was performed without the administration of intravenous contrast. COMPARISON:  CT scan 09/14/2019. FINDINGS: Markedly limited study. Lower chest: Moderate bilateral effusions.  The heart is enlarged. Hepatobiliary: 7.6 x 5.4 x 4.5 cm heterogeneous mainly T2 hyperintense lesion identified in the medial right liver, tracking up from the region of the porta hepatis/gallbladder fossa. Relationship to the right portal vein not well demonstrated. Multiple stones noted in the gallbladder. No extrahepatic biliary duct dilatation. Pancreas: Numerous cystic lesions are noted in the pancreatic parenchyma measuring  up to maximum size of approximately 1.2 cm. No dilatation of the main duct. Spleen:  No splenomegaly. No focal mass lesion. Adrenals/Urinary Tract: No adrenal nodule or mass. No hydronephrosis. Stomach/Bowel: No small bowel or colonic dilatation within the visualized abdomen. Vascular/Lymphatic: No abdominal aortic aneurysm. No abdominal lymphadenopathy. Other:  No substantial free fluid Musculoskeletal: No suspicious marrow signal abnormality. IMPRESSION: Markedly limited study due severe motion degradation, inability to administer intravenous contrast material, and patient being unable to complete the exam. Only 4 pulse sequences were obtained. 8 x 5 x 5 cm heterogeneous mainly T2 hyperintense lesion in the central liver. This cannot be further characterized given lack of intravenous contrast material and motion artifact. Tissue sampling may be warranted. Cholelithiasis. Electronically Signed   By: Misty Stanley M.D.   On: 09/15/2019 16:10   NM Hepato W/EjeCT Fract  Result Date: 09/14/2019 CLINICAL DATA:  Fever EXAM: NUCLEAR  MEDICINE HEPATOBILIARY IMAGING TECHNIQUE: Sequential images of the abdomen were obtained out to 60 minutes following intravenous administration of radiopharmaceutical. RADIOPHARMACEUTICALS:  5.2 mCi Tc-84m  Choletec IV COMPARISON:  Ultrasound 09/03/2019 FINDINGS: Prompt uptake and biliary excretion of activity by the liver. Activity passes into the small bowel compatible with patent common bile duct. There is non filling of the gallbladder. Due to the patient's hypotension, morphine was not administered. Delayed imaging to 2 hours was performed with continued non filling of the gallbladder. IMPRESSION: Non filling of the gallbladder despite delayed imaging out to 2 hours. Findings concerning for cystic duct obstruction. Common bile duct is patent. Electronically Signed   By: Rolm Baptise M.D.   On: 09/14/2019 15:58   IR Perc Cholecystostomy  Result Date: 09/16/2019 CLINICAL DATA:   Recurrent calculus cholecystitis. Patient has had 2 previous cholecystostomy catheters. EXAM: PERCUTANEOUS CHOLECYSTOSTOMY TUBE PLACEMENT WITH ULTRASOUND AND FLUOROSCOPIC GUIDANCE FLUOROSCOPY TIME:  1.2 minute; 30 uGym2 DAP TECHNIQUE: The procedure, risks (including but not limited to bleeding, infection, organ damage ), benefits, and alternatives were explained to the patient and son. Questions regarding the procedure were encouraged and answered. The son understands and consents to the procedure. Survey ultrasound of the abdomen was performed and an appropriate skin entry site was identified. Skin site was marked, prepped with chlorhexidine, and draped in usual sterile fashion, and infiltrated locally with 1% lidocaine. Intravenous Fentanyl 42mcg and Versed 0.5mg  were administered as conscious sedation during continuous monitoring of the patient's level of consciousness and physiological / cardiorespiratory status by the radiology RN, with a total moderate sedation time of 12 minutes. Under real-time ultrasound guidance, gallbladder was accessed using a transhepatic approach with a 21-gauge needle. Ultrasound image documentation was saved. Bile returned through the hub. Needle was exchanged over a 018 guidewire for transitional dilator which allowed placement of 035 J wire. Over this, a 10.2 French pigtail catheter was advanced and formed centrally in the gallbladder lumen. 10 mL of orange opaque fluid were aspirated, sent for Gram stain and culture. Small contrast injection confirmed appropriate position. Catheter secured externally with 0 Prolene suture and placed external drain bag. Patient tolerated the procedure well. COMPLICATIONS: COMPLICATIONS none IMPRESSION: 1. Technically successful percutaneous cholecystostomy tube placement with ultrasound and fluoroscopic guidance. Electronically Signed   By: Lucrezia Europe M.D.   On: 09/16/2019 13:53   DG Chest Port 1 View  Result Date: 09/10/2019 CLINICAL DATA:   Weakness EXAM: PORTABLE CHEST 1 VIEW COMPARISON:  09/04/2019 FINDINGS: Cardiomediastinal contours are stable accounting for portable technique and are partially obscured by graded opacity in the left and right chest. Signs of coronary stenting are noted along the left heart border. Increased interstitial markings are noted bilaterally without focal consolidation aside from obscuration of right hemidiaphragm. Spinal degenerative changes. Visualized skeletal structures otherwise unremarkable. IMPRESSION: 1. Probable new bilateral effusions and mild interstitial edema. Electronically Signed   By: Zetta Bills M.D.   On: 09/06/2019 15:33   DG Chest Port 1 View  Result Date: 09/04/2019 CLINICAL DATA:  85 year old male with chest pain and shortness of breath. EXAM: PORTABLE CHEST 1 VIEW COMPARISON:  Chest radiographs 02/28/2017 and earlier. FINDINGS: Portable AP upright view at 1437 hours. Lower lung volumes and kyphotic positioning. Mediastinal contours appear stable and within normal limits. Visualized tracheal air column is within normal limits. No definite acute pulmonary opacity. There is mild asymmetric chronic scarring in the right upper lobe. No pleural effusion or pneumothorax. No acute osseous abnormality identified. IMPRESSION: No acute cardiopulmonary  abnormality identified. Electronically Signed   By: Genevie Ann M.D.   On: 09/04/2019 15:02   ECHOCARDIOGRAM COMPLETE  Result Date: 09/05/2019   ECHOCARDIOGRAM REPORT   Patient Name:   Robert [name] Date of Exam: 09/05/2019 Medical Rec #:  [id number]    Height:       69.0 in Accession #:    [id number]   Weight:       147.6 lb Date of Birth:  1928/04/08   BSA:          1.82 m Patient Age:    69 years     BP:           149/63 mmHg Patient Gender: M            HR:           94 bpm. Exam Location:  Inpatient Procedure: 2D Echo, Cardiac Doppler and Color Doppler Indications:    R07.9* Chest pain, unspecified  History:        Patient has prior history of  Echocardiogram examinations, most                 recent 03/02/2017. Cardiomyopathy, CAD; Risk Factors:Hypertension                 and Diabetes. CKD. Heart Block. Cancer.  Sonographer:    Jonelle Sidle Dance Referring Phys: [place]  1. Left ventricular ejection fraction, by visual estimation, is 35 to 40%. The left ventricle has mildly decreased function. There is mildly increased left ventricular hypertrophy.  2. Elevated left atrial and left ventricular end-diastolic pressures.  3. Left ventricular diastolic parameters are consistent with Grade II diastolic dysfunction (pseudonormalization).  4. The left ventricle demonstrates global hypokinesis.  5. Global right ventricle has low normal systolic function.The right ventricular size is normal. No increase in right ventricular wall thickness.  6. Left atrial size was moderately dilated.  7. Right atrial size was normal.  8. The mitral valve is abnormal. Mild mitral valve regurgitation.  9. The tricuspid valve is grossly normal. Tricuspid valve regurgitation is mild. 10. The aortic valve is tricuspid. Aortic valve regurgitation is trivial. Mild aortic valve sclerosis without stenosis. 11. The pulmonic valve was grossly normal. Pulmonic valve regurgitation is trivial. 12. Moderately elevated pulmonary artery systolic pressure. 13. The inferior vena cava is normal in size with <50% respiratory variability, suggesting right atrial pressure of 8 mmHg. 14. The interatrial septum appears to be lipomatous. FINDINGS  Left Ventricle: Left ventricular ejection fraction, by visual estimation, is 35 to 40%. The left ventricle has mildly decreased function. The left ventricle demonstrates global hypokinesis. There is mildly increased left ventricular hypertrophy. Left ventricular diastolic parameters are consistent with Grade II diastolic dysfunction (pseudonormalization). Elevated left atrial and left ventricular end-diastolic pressures. Right Ventricle: The  right ventricular size is normal. No increase in right ventricular wall thickness. Global RV systolic function is has low normal systolic function. The tricuspid regurgitant velocity is 3.08 m/s, and with an assumed right atrial pressure of 8 mmHg, the estimated right ventricular systolic pressure is moderately elevated at 45.9 mmHg. Left Atrium: Left atrial size was moderately dilated. Right Atrium: Right atrial size was normal in size Pericardium: There is no evidence of pericardial effusion. Mitral Valve: The mitral valve is abnormal. There is mild thickening of the mitral valve leaflet(s). Mild mitral valve regurgitation. Tricuspid Valve: The tricuspid valve is grossly normal. Tricuspid valve regurgitation is mild. Aortic Valve: The aortic valve is tricuspid. Aortic valve  regurgitation is trivial. Aortic regurgitation PHT measures 338 msec. Mild aortic valve sclerosis is present, with no evidence of aortic valve stenosis. Pulmonic Valve: The pulmonic valve was grossly normal. Pulmonic valve regurgitation is trivial. Pulmonic regurgitation is trivial. Aorta: The aortic root and ascending aorta are structurally normal, with no evidence of dilitation. Venous: The inferior vena cava is normal in size with less than 50% respiratory variability, suggesting right atrial pressure of 8 mmHg. IAS/Shunts: Increased thickness of the atrial septum sparing the fossa ovalis consistent with The interatrial septum appears to be lipomatous. No atrial level shunt detected by color flow Doppler.  LEFT VENTRICLE PLAX 2D LVIDd:         5.56 cm  Diastology LVIDs:         4.29 cm  LV e' lateral:   9.68 cm/s LV PW:         1.13 cm  LV E/e' lateral: 11.0 LV IVS:        0.77 cm  LV e' medial:    7.94 cm/s LVOT diam:     2.00 cm  LV E/e' medial:  13.4 LV SV:         69 ml LV SV Index:   37.99 LVOT Area:     3.14 cm  RIGHT VENTRICLE             IVC RV Basal diam:  2.72 cm     IVC diam: 1.81 cm RV S prime:     10.60 cm/s TAPSE (M-mode): 1.1  cm LEFT ATRIUM             Index       RIGHT ATRIUM           Index LA diam:        4.60 cm 2.53 cm/m  RA Area:     20.50 cm LA Vol (A2C):   85.7 ml 47.20 ml/m RA Volume:   60.10 ml  33.10 ml/m LA Vol (A4C):   68.4 ml 37.67 ml/m LA Biplane Vol: 76.5 ml 42.13 ml/m  AORTIC VALVE LVOT Vmax:   74.60 cm/s LVOT Vmean:  46.450 cm/s LVOT VTI:    0.114 m AI PHT:      338 msec  AORTA Ao Root diam: 3.90 cm Ao Asc diam:  3.60 cm MITRAL VALVE                         TRICUSPID VALVE MV Area (PHT): 4.49 cm              TR Peak grad:   37.9 mmHg MV PHT:        49.01 msec            TR Vmax:        330.00 cm/s MV Decel Time: 169 msec MV E velocity: 106.00 cm/s 103 cm/s  SHUNTS MV A velocity: 56.30 cm/s  70.3 cm/s Systemic VTI:  0.11 m MV E/A ratio:  1.88        1.5       Systemic Diam: 2.00 cm  Lyman Bishop MD Electronically signed by Lyman Bishop MD Signature Date/Time: 09/05/2019/2:30:01 PM    Final    US Abdomen Limited RUQ  Result Date: 09/16/2019 CLINICAL DATA:  Right upper quadrant pain EXAM: ULTRASOUND ABDOMEN LIMITED RIGHT UPPER QUADRANT COMPARISON:  CT same day FINDINGS: Gallbladder: There are multiple shadowing gallstones filling the gallbladder the largest measuring 1.8 cm. There is a diffusely thickened  gallbladder wall measuring 6.3 mm. No sonographic Percell Miller sign is seen. Common bile duct: Diameter: 2 mm. Liver: There is a heterogeneous appearance to the liver parenchyma. The hypodense mass seen on CT same day is not well visualized on this exam. Portal vein is patent on color Doppler imaging with normal direction of blood flow towards the liver. Other: None. IMPRESSION: Cholelithiasis with diffuse gallbladder wall thickening which could be suggestive of acute cholecystitis. Heterogeneous liver parenchyma. The ill-defined mass on recent CT is not well visualized on this examination. Would recommend CT with contrast or MRI for further evaluation. Electronically Signed   By: Prudencio Pair M.D.   On:  08/25/2019 22:58     Time Spent in minutes  30     Desiree Hane M.D on 09/16/2019 at 10:28 PM  To page go to [email or web address] - password [facility]

## 2019-09-16 NOTE — Plan of Care (Signed)
  Problem: Pain Managment: Goal: General experience of comfort will improve Outcome: Progressing   Problem: Safety: Goal: Ability to remain free from injury will improve Outcome: Progressing   Problem: Skin Integrity: Goal: Risk for impaired skin integrity will decrease Outcome: Progressing   

## 2019-09-16 NOTE — Telephone Encounter (Signed)
May need to cancel his Jan appt.  JV

## 2019-09-16 NOTE — Progress Notes (Signed)
Subjective: Sleeping but arousable.  A little agitated, but no obvious physical distress.  Moans a little but otherwise nonverbal.  Does not appear to be in any distress. Some question as to whether they are going to do limited ultrasound of gallbladder today, however last note in the chart and on imaging is that they were going to repeat this this morning. We will standby to see if ultrasound can be done today Remains afebrile.  Heart rate 89.  WBC coming down, 17,300.  Was 23,00  48 hours ago.  Electrolytes and renal function stable.  Glucose 201.   Objective: Vital signs in last 24 hours: Temp:  [97.5 F (36.4 C)-98.1 F (36.7 C)] 98.1 F (36.7 C) (12/23 0414) Pulse Rate:  [86-89] 89 (12/23 0414) Resp:  [20] 20 (12/23 0414) BP: (120-128)/(56-70) 120/70 (12/23 0414) SpO2:  [100 %] 100 % (12/23 0414) Weight:  [71.7 kg] 71.7 kg (12/23 0500)    Intake/Output from previous day: 12/22 0701 - 12/23 0700 In: 684.8 [I.V.:328.4; IV Piggyback:356.4] Out: 950 [Urine:950] Intake/Output this shift: No intake/output data recorded.  EXAM: General appearance:Very feeble. Mild pallor. Skin warm and dry.   Mumbles.  Markedly deconditioned. Resp:clear to auscultation bilaterally YB:WLSLHTD is not distended. Soft and scaphoid . no palpable mass.    Lab Results:  Recent Labs    09/15/19 0156 09/16/19 0228  WBC 23.4* 17.3*  HGB 10.5* 9.2*  HCT 30.8* 26.5*  PLT 379 349   BMET Recent Labs    09/15/19 0156 09/16/19 0228  NA 147* 147*  K 4.2 3.7  CL 108 115*  CO2 24 23  GLUCOSE 233* 201*  BUN 54* 47*  CREATININE 2.31* 2.15*  CALCIUM 9.5 9.2   PT/INR Recent Labs    09/19/2019 1916  LABPROT 14.4  INR 1.1   ABG No results for input(s): PHART, HCO3 in the last 72 hours.  Invalid input(s): PCO2, PO2  Studies/Results: MR LIVER WO CONRTAST  Result Date: 09/15/2019 CLINICAL DATA:  Liver mass. EXAM: MRI ABDOMEN WITHOUT CONTRAST TECHNIQUE: Multiplanar multisequence  MR imaging was performed without the administration of intravenous contrast. COMPARISON:  CT scan 09/24/2019. FINDINGS: Markedly limited study. Lower chest: Moderate bilateral effusions.  The heart is enlarged. Hepatobiliary: 7.6 x 5.4 x 4.5 cm heterogeneous mainly T2 hyperintense lesion identified in the medial right liver, tracking up from the region of the porta hepatis/gallbladder fossa. Relationship to the right portal vein not well demonstrated. Multiple stones noted in the gallbladder. No extrahepatic biliary duct dilatation. Pancreas: Numerous cystic lesions are noted in the pancreatic parenchyma measuring up to maximum size of approximately 1.2 cm. No dilatation of the main duct. Spleen:  No splenomegaly. No focal mass lesion. Adrenals/Urinary Tract: No adrenal nodule or mass. No hydronephrosis. Stomach/Bowel: No small bowel or colonic dilatation within the visualized abdomen. Vascular/Lymphatic: No abdominal aortic aneurysm. No abdominal lymphadenopathy. Other:  No substantial free fluid Musculoskeletal: No suspicious marrow signal abnormality. IMPRESSION: Markedly limited study due severe motion degradation, inability to administer intravenous contrast material, and patient being unable to complete the exam. Only 4 pulse sequences were obtained. 8 x 5 x 5 cm heterogeneous mainly T2 hyperintense lesion in the central liver. This cannot be further characterized given lack of intravenous contrast material and motion artifact. Tissue sampling may be warranted. Cholelithiasis. Electronically Signed   By: Misty Stanley M.D.   On: 09/15/2019 16:10   NM Hepato W/EjeCT Fract  Result Date: 09/14/2019 CLINICAL DATA:  Fever EXAM: NUCLEAR MEDICINE HEPATOBILIARY IMAGING TECHNIQUE: Sequential  images of the abdomen were obtained out to 60 minutes following intravenous administration of radiopharmaceutical. RADIOPHARMACEUTICALS:  5.2 mCi Tc-39m  Choletec IV COMPARISON:  Ultrasound 09/20/2019 FINDINGS: Prompt uptake  and biliary excretion of activity by the liver. Activity passes into the small bowel compatible with patent common bile duct. There is non filling of the gallbladder. Due to the patient's hypotension, morphine was not administered. Delayed imaging to 2 hours was performed with continued non filling of the gallbladder. IMPRESSION: Non filling of the gallbladder despite delayed imaging out to 2 hours. Findings concerning for cystic duct obstruction. Common bile duct is patent. Electronically Signed   By: Rolm Baptise M.D.   On: 09/14/2019 15:58    Anti-infectives: Anti-infectives (From admission, onward)   Start     Dose/Rate Route Frequency Ordered Stop   09/15/19 2030  vancomycin (VANCOCIN) IVPB 1000 mg/200 mL premix  Status:  Discontinued     1,000 mg 200 mL/hr over 60 Minutes Intravenous Every 48 hours 09/11/2019 1923 09/14/19 0315   09/14/19 1930  ceFEPIme (MAXIPIME) 2 g in sodium chloride 0.9 % 100 mL IVPB     2 g 200 mL/hr over 30 Minutes Intravenous Every 24 hours 09/20/2019 1921     09/14/19 0600  metroNIDAZOLE (FLAGYL) IVPB 500 mg     500 mg 100 mL/hr over 60 Minutes Intravenous Every 8 hours 09/14/19 0315     09/03/2019 1930  vancomycin (VANCOREADY) IVPB 1250 mg/250 mL     1,250 mg 166.7 mL/hr over 90 Minutes Intravenous  Once 09/21/2019 1921 09/20/2019 2304   09/07/2019 1900  ceFEPIme (MAXIPIME) 2 g in sodium chloride 0.9 % 100 mL IVPB     2 g 200 mL/hr over 30 Minutes Intravenous  Once 09/15/2019 1852 09/02/2019 2024   08/30/2019 1900  metroNIDAZOLE (FLAGYL) IVPB 500 mg     500 mg 100 mL/hr over 60 Minutes Intravenous  Once 08/31/2019 1852 08/25/2019 2041   09/21/2019 1900  vancomycin (VANCOCIN) IVPB 1000 mg/200 mL premix  Status:  Discontinued     1,000 mg 200 mL/hr over 60 Minutes Intravenous  Once 08/30/2019 1852 09/04/2019 1921      Assessment/Plan:   Acute cholecystitis. This is recurrent. Past history of percutaneous drainage. Ultrasound and HIDA scan consistent with acute  cholecystitis Patient is a very poor surgical candidate and ideal intervention at this time would be percutaneous cholecystostomy tube On imaging gallbladder is contracted and this does not allow for percutaneous access  Repeat ultrasound today scheduled in hopes that the gallbladder has become distended Continue to hold Plavix Continue antibiotics  If percutaneous drainage could not be done I would simply recommend treating medically with antibiotics since he actually may be responding to antibiotics If percutaneous drainage not done you may resume diet as tolerated  Combined systolic and diastolic congestive heart failure Paroxysmal atrial fibrillation Plavix on hold Metabolic encephalopathy, acute on chronic Liver mass-this can be evaluated later, if desired. IDDM Anemia. Hemoglobin 9.0. No evidence of active bleeding CKD stage IV Goals of care.  Defer discussion with primary service.  Patient is very feeble and appears  in decline.   LOS: 3 days    Robert [name] 09/16/2019
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
## 2019-09-16 NOTE — Progress Notes (Signed)
Pharmacy Antibiotic Note  Robert [name] is a 85 y.o. male admitted on 09/21/2019 with infection of unknown source.  Pharmacy has been consulted for vancomycin/cefepime dosing. Pt is afebrile and WBC is down to 17.3. SCr is elevated at 2.15 but at patients baseline. Planning possible drain placement today.   Plan: Continue cefepime 2gm IV Q24H F/u renal fxn, C&S, clinical status and LOT *Pharmacy will sign off as no further dose adjustments are anticipated.    Weight: 158 lb (71.7 kg)  Temp (24hrs), Avg:97.8 F (36.6 C), Min:97.5 F (36.4 C), Max:98.1 F (36.7 C)  Recent Labs  Lab 09/16/2019 1543 08/31/2019 1632 08/26/2019 1916 09/09/2019 2133 09/14/19 0333 09/15/19 0156 09/16/19 0228  WBC  --  20.8*  --   --  28.3* 23.4* 17.3*  CREATININE 2.19*  --   --   --  2.30* 2.31* 2.15*  LATICACIDVEN  --   --  2.1* 1.8  --   --   --     Estimated Creatinine Clearance: 23.3 mL/min (A) (by C-G formula based on SCr of 2.15 mg/dL (H)).    No Known Allergies  Cefepime 12/20>> Flagyl 12/20>>  12/20 COVID - NEG 12/20 Flu - NEG 12/20 Blood - NGTD  Salome Arnt, PharmD, BCPS Clinical Pharmacist Please see AMION for all pharmacy numbers 09/16/2019 9:48 AM

## 2019-09-17 DIAGNOSIS — I5043 Acute on chronic combined systolic (congestive) and diastolic (congestive) heart failure: Secondary | ICD-10-CM

## 2019-09-17 DIAGNOSIS — I255 Ischemic cardiomyopathy: Secondary | ICD-10-CM

## 2019-09-17 DIAGNOSIS — R1011 Right upper quadrant pain: Secondary | ICD-10-CM

## 2019-09-17 LAB — BASIC METABOLIC PANEL
Anion gap: 15 (ref 5–15)
BUN: 41 mg/dL — ABNORMAL HIGH (ref 8–23)
CO2: 19 mmol/L — ABNORMAL LOW (ref 22–32)
Calcium: 9.1 mg/dL (ref 8.9–10.3)
Chloride: 116 mmol/L — ABNORMAL HIGH (ref 98–111)
Creatinine, Ser: 2.02 mg/dL — ABNORMAL HIGH (ref 0.61–1.24)
GFR calc Af Amer: 33 mL/min — ABNORMAL LOW (ref >60)
GFR calc non Af Amer: 28 mL/min — ABNORMAL LOW (ref >60)
Glucose, Bld: 307 mg/dL — ABNORMAL HIGH (ref 70–99)
Potassium: 4 mmol/L (ref 3.5–5.1)
Sodium: 150 mmol/L — ABNORMAL HIGH (ref 135–145)

## 2019-09-17 LAB — CBC
HCT: 28.8 % — ABNORMAL LOW (ref 39.0–52.0)
Hemoglobin: 9.5 g/dL — ABNORMAL LOW (ref 13.0–17.0)
MCH: 34.5 pg — ABNORMAL HIGH (ref 26.0–34.0)
MCHC: 33 g/dL (ref 30.0–36.0)
MCV: 104.7 fL — ABNORMAL HIGH (ref 80.0–100.0)
Platelets: 358 10*3/uL (ref 150–400)
RBC: 2.75 MIL/uL — ABNORMAL LOW (ref 4.22–5.81)
RDW: 23.4 % — ABNORMAL HIGH (ref 11.5–15.5)
WBC: 16.2 10*3/uL — ABNORMAL HIGH (ref 4.0–10.5)
nRBC: 0.2 % (ref 0.0–0.2)

## 2019-09-17 LAB — TYPE AND SCREEN
ABO/RH(D): A POS
Antibody Screen: POSITIVE
Donor AG Type: NEGATIVE
Donor AG Type: NEGATIVE
Unit division: 0
Unit division: 0

## 2019-09-17 LAB — BPAM RBC
Blood Product Expiration Date: 202101242359
Blood Product Expiration Date: 202101242359
Unit Type and Rh: 6200
Unit Type and Rh: 6200

## 2019-09-17 LAB — GLUCOSE, CAPILLARY
Glucose-Capillary: 313 mg/dL — ABNORMAL HIGH (ref 70–99)
Glucose-Capillary: 246 mg/dL — ABNORMAL HIGH (ref 70–99)
Glucose-Capillary: 256 mg/dL — ABNORMAL HIGH (ref 70–99)

## 2019-09-17 MED ORDER — CLOPIDOGREL BISULFATE 75 MG PO TABS
75.0000 mg | ORAL_TABLET | Freq: Every day | ORAL | Status: DC
  Administered 2019-09-17 – 2019-09-21 (×5): 75 mg via ORAL
  Filled 2019-09-17 (×5): qty 1

## 2019-09-17 MED ORDER — INSULIN GLARGINE 100 UNIT/ML ~~LOC~~ SOLN
10.0000 [IU] | Freq: Every morning | SUBCUTANEOUS | Status: DC
  Administered 2019-09-17 – 2019-09-21 (×5): 10 [IU] via SUBCUTANEOUS
  Filled 2019-09-17 (×6): qty 0.1

## 2019-09-17 NOTE — Progress Notes (Signed)
PHARMACY - PHYSICIAN COMMUNICATION CRITICAL VALUE ALERT - BLOOD CULTURE IDENTIFICATION (BCID)  Robert [name] is an 85 y.o. male who presented to [facility] on 09/14/2019 with a chief complaint of altered mental status.   Assessment:  85 year old male admitted with altered mental status and found to have acute cholecystitis. Now s/p cholecystectomy tube placed 12/23. Cultures from drain are in process. Blood cultures from 12/20 are now showing Gram positive cocci in pairs and chains, which could be a strep species.   Name of physician (or Provider) Contacted: Lisbeth Ply  Current antibiotics: Cefepime/Flagyl  Changes to prescribed antibiotics recommended:  None- Continue cefepime/flagyl until more culture data results   No results found for this or any previous visit.  Jimmy Footman, PharmD, BCPS, BCIDP Infectious Diseases Clinical Pharmacist Phone: [phone] 09/17/2019  1:42 PM

## 2019-09-17 NOTE — Telephone Encounter (Signed)
Spoke to patient's son. He states that the patient is in the hospital with gall bladder issues. He states that he should be discharged in the next day or so. Patient has an appointment with Dr. [name] on 10/06/19. The patient was suppose to wear a 14 day monitor before then, but has been unable to since he was in the hospital. Son wants patient to keep appointment with Dr. [name], but wants to know if the patient still needs the monitor. Made son aware that I will forward for review.

## 2019-09-17 NOTE — Progress Notes (Signed)
Subjective/Chief Complaint: Pt tol his diet GB tube placed yesterday and output bilious   Objective: Vital signs in last 24 hours: Temp:  [97.7 F (36.5 C)-98.2 F (36.8 C)] 97.7 F (36.5 C) (12/24 0522) Pulse Rate:  [62-79] 73 (12/24 0522) Resp:  [15-24] 22 (12/24 0522) BP: (106-146)/(58-73) 106/61 (12/24 0522) SpO2:  [90 %-100 %] 92 % (12/24 0522) Weight:  [71.7 kg] 71.7 kg (12/24 0500) Last BM Date: 09/15/19  Intake/Output from previous day: 12/23 0701 - 12/24 0700 In: 842 [I.V.:542; IV Piggyback:300] Out: 50 [Drains:50] Intake/Output this shift: No intake/output data recorded.  Constitutional: No acute distress, conversant, appears states age. Eyes: Anicteric sclerae, moist conjunctiva, no lid lag Lungs: Clear to auscultation bilaterally, normal respiratory effort CV: regular rate and rhythm, no murmurs, no peripheral edema, pedal pulses 2+ GI: Soft, no masses or hepatosplenomegaly, non-tender to palpation, drain bilious Skin: No rashes, palpation reveals normal turgor Psychiatric: appropriate judgment and insight, oriented to person, place, and time   Lab Results:  Recent Labs    09/16/19 0228 09/17/19 0403  WBC 17.3* 16.2*  HGB 9.2* 9.5*  HCT 26.5* 28.8*  PLT 349 358   BMET Recent Labs    09/16/19 0228 09/17/19 0403  NA 147* 150*  K 3.7 4.0  CL 115* 116*  CO2 23 19*  GLUCOSE 201* 307*  BUN 47* 41*  CREATININE 2.15* 2.02*  CALCIUM 9.2 9.1   PT/INR No results for input(s): LABPROT, INR in the last 72 hours. ABG No results for input(s): PHART, HCO3 in the last 72 hours.  Invalid input(s): PCO2, PO2  Studies/Results: MR LIVER WO CONRTAST  Result Date: 09/15/2019 CLINICAL DATA:  Liver mass. EXAM: MRI ABDOMEN WITHOUT CONTRAST TECHNIQUE: Multiplanar multisequence MR imaging was performed without the administration of intravenous contrast. COMPARISON:  CT scan 09/20/2019. FINDINGS: Markedly limited study. Lower chest: Moderate bilateral  effusions.  The heart is enlarged. Hepatobiliary: 7.6 x 5.4 x 4.5 cm heterogeneous mainly T2 hyperintense lesion identified in the medial right liver, tracking up from the region of the porta hepatis/gallbladder fossa. Relationship to the right portal vein not well demonstrated. Multiple stones noted in the gallbladder. No extrahepatic biliary duct dilatation. Pancreas: Numerous cystic lesions are noted in the pancreatic parenchyma measuring up to maximum size of approximately 1.2 cm. No dilatation of the main duct. Spleen:  No splenomegaly. No focal mass lesion. Adrenals/Urinary Tract: No adrenal nodule or mass. No hydronephrosis. Stomach/Bowel: No small bowel or colonic dilatation within the visualized abdomen. Vascular/Lymphatic: No abdominal aortic aneurysm. No abdominal lymphadenopathy. Other:  No substantial free fluid Musculoskeletal: No suspicious marrow signal abnormality. IMPRESSION: Markedly limited study due severe motion degradation, inability to administer intravenous contrast material, and patient being unable to complete the exam. Only 4 pulse sequences were obtained. 8 x 5 x 5 cm heterogeneous mainly T2 hyperintense lesion in the central liver. This cannot be further characterized given lack of intravenous contrast material and motion artifact. Tissue sampling may be warranted. Cholelithiasis. Electronically Signed   By: Misty Stanley M.D.   On: 09/15/2019 16:10   IR Perc Cholecystostomy  Result Date: 09/16/2019 CLINICAL DATA:  Recurrent calculus cholecystitis. Patient has had 2 previous cholecystostomy catheters. EXAM: PERCUTANEOUS CHOLECYSTOSTOMY TUBE PLACEMENT WITH ULTRASOUND AND FLUOROSCOPIC GUIDANCE FLUOROSCOPY TIME:  1.2 minute; 60 uGym2 DAP TECHNIQUE: The procedure, risks (including but not limited to bleeding, infection, organ damage ), benefits, and alternatives were explained to the patient and son. Questions regarding the procedure were encouraged and answered. The son understands  and consents to the procedure. Survey ultrasound of the abdomen was performed and an appropriate skin entry site was identified. Skin site was marked, prepped with chlorhexidine, and draped in usual sterile fashion, and infiltrated locally with 1% lidocaine. Intravenous Fentanyl 13mcg and Versed 0.5mg  were administered as conscious sedation during continuous monitoring of the patient's level of consciousness and physiological / cardiorespiratory status by the radiology RN, with a total moderate sedation time of 12 minutes. Under real-time ultrasound guidance, gallbladder was accessed using a transhepatic approach with a 21-gauge needle. Ultrasound image documentation was saved. Bile returned through the hub. Needle was exchanged over a 018 guidewire for transitional dilator which allowed placement of 035 J wire. Over this, a 10.2 French pigtail catheter was advanced and formed centrally in the gallbladder lumen. 10 mL of orange opaque fluid were aspirated, sent for Gram stain and culture. Small contrast injection confirmed appropriate position. Catheter secured externally with 0 Prolene suture and placed external drain bag. Patient tolerated the procedure well. COMPLICATIONS: COMPLICATIONS none IMPRESSION: 1. Technically successful percutaneous cholecystostomy tube placement with ultrasound and fluoroscopic guidance. Electronically Signed   By: Lucrezia Europe M.D.   On: 09/16/2019 13:53    Anti-infectives: Anti-infectives (From admission, onward)   Start     Dose/Rate Route Frequency Ordered Stop   09/15/19 2030  vancomycin (VANCOCIN) IVPB 1000 mg/200 mL premix  Status:  Discontinued     1,000 mg 200 mL/hr over 60 Minutes Intravenous Every 48 hours 09/18/2019 1923 09/14/19 0315   09/14/19 1930  ceFEPIme (MAXIPIME) 2 g in sodium chloride 0.9 % 100 mL IVPB     2 g 200 mL/hr over 30 Minutes Intravenous Every 24 hours 09/19/2019 1921     09/14/19 0600  metroNIDAZOLE (FLAGYL) IVPB 500 mg     500 mg 100 mL/hr over  60 Minutes Intravenous Every 8 hours 09/14/19 0315     09/16/2019 1930  vancomycin (VANCOREADY) IVPB 1250 mg/250 mL     1,250 mg 166.7 mL/hr over 90 Minutes Intravenous  Once 09/24/2019 1921 09/10/2019 2304   08/26/2019 1900  ceFEPIme (MAXIPIME) 2 g in sodium chloride 0.9 % 100 mL IVPB     2 g 200 mL/hr over 30 Minutes Intravenous  Once 09/09/2019 1852 09/18/2019 2024   08/28/2019 1900  metroNIDAZOLE (FLAGYL) IVPB 500 mg     500 mg 100 mL/hr over 60 Minutes Intravenous  Once 08/25/2019 1852 09/12/2019 2041   09/02/2019 1900  vancomycin (VANCOCIN) IVPB 1000 mg/200 mL premix  Status:  Discontinued     1,000 mg 200 mL/hr over 60 Minutes Intravenous  Once 09/09/2019 1852 09/21/2019 1921      Assessment/Plan: Acute cholecystitis. This is recurrent. Past history of percutaneous drainage. Ultrasoundand HIDA scanconsistent with acute cholecystitis Patient is a very poor surgical candidate  -obtained Cholecystostomy tube yesterday -resume Plavix per primary team Continue antibiotics    Combined systolic and diastolic congestive heart failure Paroxysmal atrial fibrillation Plavix on hold Metabolic encephalopathy, acute on chronic Liver mass-this can be evaluated later, if desired. IDDM Anemia. Hemoglobin 9.0. No evidence of active bleeding CKD stage IV  -call back if needed. F/u in chart    LOS: 4 days    Ralene Ok 09/17/2019

## 2019-09-17 NOTE — TOC Initial Note (Signed)
Transition of Care [facility] - Initial/Assessment Note    Patient Details  Name: Robert [name] MRN: [id number] Date of Birth: 27-Jan-1928  Transition of [facility]) CM/SW Contact:    Marilu Favre, RN Phone Number: 09/17/2019, 11:02 AM  Clinical Narrative:                  Spoke to patient and son. Patient from home with son Robert [name]. Robert [name] is retired and can provide 24 hour assistance.   Prior to admission patient was independent and did not use any DME.   WIll request PT/OT  Expected Discharge Plan: [place]     Patient Goals and CMS Choice Patient states their goals for this hospitalization and ongoing recovery are:: to return to home CMS Medicare.gov Compare Post Acute Care list provided to:: Patient    Expected Discharge Plan and Services Expected Discharge Plan: [place]       Living arrangements for the past 2 months: Single Family Home                                      Prior Living Arrangements/Services Living arrangements for the past 2 months: Single Family Home Lives with:: Adult Children Patient language and need for interpreter reviewed:: Yes Do you feel safe going back to the place where you live?: Yes      Need for Family Participation in Patient Care: Yes (Comment) Care giver support system in place?: Yes (comment)   Criminal Activity/Legal Involvement Pertinent to Current Situation/Hospitalization: No - Comment as needed  Activities of Daily Living      Permission Sought/Granted   Permission granted to share information with : Yes, Verbal Permission Granted  Share Information with NAME: son RIck           Emotional Assessment              Admission diagnosis:  Acute cholecystitis [K81.0] Cholecystitis [K81.9] RUQ pain [R10.11] Liver mass [R16.0] Febrile illness, acute [R50.9] Altered mental status, unspecified altered mental status type [R41.82] Patient Active Problem List   Diagnosis Date Noted  . Hypernatremia 09/16/2019  . Acute metabolic encephalopathy 84/69/6295  . AF (paroxysmal atrial fibrillation) ([place]) 09/16/2019  . Liver mass 09/16/2019  . CHF exacerbation ([place]) 09/14/2019  . Angina at rest [facility]) 09/04/2019  . Sepsis ([place]) 03/01/2017  . Chronic systolic heart failure ([place]) 02/07/2016  . Ischemic cardiomyopathy   . Coronary artery disease due to lipid rich plaque   . Moderate mitral regurgitation   . High cholesterol   . NSVT (nonsustained ventricular tachycardia) ([place]) 05/13/2015  . Complete heart block ([place]) 05/13/2015  . Acute cholecystitis   . Right upper quadrant pain   . Chest pain 05/09/2015  . Symptomatic bradycardia 05/09/2015  . AV block, 2nd degree 05/09/2015  . Abdominal pain   . Stable angina ([place]) 05/08/2015  . Pain in the chest   . Old MI (myocardial infarction) 07/08/2014  . Mitral valve disorder 10/18/2011  . Acute MI inferior posterior subsequent episode care ([place]) 10/18/2011  . Coronary atherosclerosis of native coronary artery 10/18/2011  . Mixed hyperlipidemia 10/18/2011  . Type II diabetes mellitus with renal manifestations ([place]) 10/18/2011  . Essential hypertension, benign 10/18/2011  . CKD (chronic kidney disease), stage IV ([place]) 10/18/2011  . Macrocytic anemia 10/18/2011   PCP:  [name], MD Pharmacy:   [id number] -  [place], [place] - [address] [place] [address] Phone: [phone] Fax: [phone]  [place], [place] [address] [place] [address] Phone: [phone] Fax: [phone]     Social Determinants of Health (SDOH) Interventions    Readmission Risk Interventions No flowsheet data found.

## 2019-09-17 NOTE — Progress Notes (Signed)
Referring Physician(s): Ingram,H  Supervising Physician: Arne Cleveland  Patient Status:  [facility] - In-pt  Chief Complaint:  cholecystitis  Subjective: Pt resting comfortably, has some RUQ soreness at drain site; no N/V   Allergies: Patient has no known allergies.  Medications: Prior to Admission medications   Medication Sig Start Date End Date Taking? Authorizing Provider  ALPRAZolam Duanne Moron) 0.25 MG tablet Take 0.25 mg by mouth at bedtime as needed for anxiety or sleep.  08/17/19  Yes [provider]  Cholecalciferol (VITAMIN D3) 5000 units CAPS Take 5,000 Units by mouth daily.    Yes [provider]  clopidogrel (PLAVIX) 75 MG tablet TAKE 1 TABLET BY MOUTH  DAILY Patient taking differently: Take 75 mg by mouth daily. PLAVIX - Should not be held prior to cath procedure unless specifically ordered 09/04/19  Yes Jettie Booze, MD  furosemide (LASIX) 40 MG tablet Take 40 mg by mouth daily.    Yes [provider]  Insulin Glargine (BASAGLAR KWIKPEN) 100 UNIT/ML SOPN INJECT 16 UNITS UNDER THE SKIN ONCE DAILY. Patient taking differently: Inject 16 Units into the skin daily.  04/09/19  Yes Elayne Snare, MD  insulin lispro (HUMALOG KWIKPEN) 100 UNIT/ML KwikPen Inject 9 units under the skin 3 times daily. DX:E11.65 Patient taking differently: Inject 6-8 Units into the skin 3 (three) times daily.  03/25/19  Yes Elayne Snare, MD  metoprolol succinate (TOPROL-XL) 25 MG 24 hr tablet Take 1 tablet (25 mg total) by mouth daily. Take with or immediately following a meal. 09/08/19  Yes Imogene Burn, PA-C  Multiple Vitamin (MULTI VITAMIN MENS) tablet Take 1 tablet by mouth daily.   Yes [provider]  multivitamin-lutein (OCUVITE-LUTEIN) CAPS capsule Take 1 capsule by mouth daily.   Yes [provider]  pravastatin (PRAVACHOL) 80 MG tablet TAKE 1 TABLET BY MOUTH  DAILY Patient taking differently: Take 80 mg by mouth daily.  09/04/19  Yes  Jettie Booze, MD  ranitidine (ZANTAC) 150 MG tablet Take 150 mg by mouth 2 (two) times daily.   Yes [provider]  BD PEN NEEDLE NANO U/F 32G X 4 MM MISC USE TO INJECT INSULIN 4  TIMES DAILY AS DIRECTED 06/12/18   Elayne Snare, MD  glucose blood (ONE TOUCH ULTRA TEST) test strip TEST 4 TIMES A DAY DIAG E11.29 12/30/18   Elayne Snare, MD     Vital Signs: BP 106/61 (BP Location: Right Arm)   Pulse 73   Temp 97.7 F (36.5 C) (Oral)   Resp (!) 22   Wt 158 lb 1.1 oz (71.7 kg)   SpO2 92%   BMI 23.34 kg/m   Physical Exam GB drain intact, insertion site ok, mildly tender, output 150 cc green bile; drain irrigated without difficulty  Imaging: CT ABDOMEN PELVIS WO CONTRAST  Result Date: 09/12/2019 CLINICAL DATA:  Abdominal pain and fever. The patient has a history of diabetes and prostate cancer. EXAM: CT ABDOMEN AND PELVIS WITHOUT CONTRAST TECHNIQUE: Multidetector CT imaging of the abdomen and pelvis was performed following the standard protocol without IV contrast. COMPARISON:  February 28, 2017 FINDINGS: Lower chest: There is a moderate sized partially visualized right-sided pleural effusion. There is a small left-sided pleural effusion.The heart size is enlarged. There is a trace pericardial effusion. The intracardiac blood pool is hypodense relative to the adjacent myocardium consistent with anemia. Atelectasis is noted at the lung bases bilaterally. Hepatobiliary: There is I a hypoattenuating mass in the right hepatic lobe measuring approximately  5.1 x 4.1 by 6.9 cm. This is new since the prior study in 2018. There is cholelithiasis with some mild gallbladder wall thickening.There is no biliary ductal dilation. Pancreas: Normal contours without ductal dilatation. No peripancreatic fluid collection. Spleen: No splenic laceration or hematoma. Adrenals/Urinary Tract: --Adrenal glands: No adrenal hemorrhage. --Right kidney/ureter: The right kidney is atrophic. --Left kidney/ureter: The left  kidney is atrophic. --Urinary bladder: Unremarkable. Stomach/Bowel: --Stomach/Duodenum: No hiatal hernia or other gastric abnormality. Normal duodenal course and caliber. --Small bowel: No dilatation or inflammation. --Colon: Rectosigmoid diverticulosis without acute inflammation. --Appendix: Normal. Vascular/Lymphatic: Atherosclerotic calcification is present within the non-aneurysmal abdominal aorta, without hemodynamically significant stenosis. --No retroperitoneal lymphadenopathy. --No mesenteric lymphadenopathy. --No pelvic or inguinal lymphadenopathy. Reproductive: Unremarkable Other: No ascites or free air. The patient is status post bilateral inguinal hernia repair. Musculoskeletal. No acute displaced fractures. IMPRESSION: 1. New hypoattenuating mass in the right hepatic lobe, concerning for malignancy. This is suboptimally evaluated on this exam. Further evaluation with a contrast enhanced multiphase MRI or CT is recommended. 2. Bilateral pleural effusions, right greater than left. Given the above finding in the liver, consider further evaluation with a contrast enhanced CT of the chest. 3. Cardiomegaly and trace pericardial effusion. 4. Cholelithiasis with mild gallbladder wall thickening. If there is concern for acute cholecystitis, recommend further evaluation with right upper quadrant ultrasound. 5. Rectosigmoid diverticulosis without acute inflammation. Aortic Atherosclerosis (ICD10-I70.0). Electronically Signed   By: Constance Holster M.D.   On: 09/21/2019 21:23   CT Head Wo Contrast  Result Date: 08/29/2019 CLINICAL DATA:  85 year old male with altered mental status and increased weakness. EXAM: CT HEAD WITHOUT CONTRAST TECHNIQUE: Contiguous axial images were obtained from the base of the skull through the vertex without intravenous contrast. COMPARISON:  None. FINDINGS: Brain: No evidence of acute infarction, hemorrhage, hydrocephalus, extra-axial collection or mass lesion/mass effect.  Atrophy, chronic small-vessel white matter ischemic changes and remote RIGHT cerebellar infarct noted. Vascular: Carotid and vertebral atherosclerotic calcifications noted Skull: No acute abnormality. Sinuses/Orbits: No acute abnormality Other: None IMPRESSION: 1. No evidence of acute intracranial abnormality. 2. Atrophy, chronic small-vessel white matter ischemic changes and remote RIGHT cerebellar infarct. Electronically Signed   By: Margarette Canada M.D.   On: 09/17/2019 16:00   MR LIVER WO CONRTAST  Result Date: 09/15/2019 CLINICAL DATA:  Liver mass. EXAM: MRI ABDOMEN WITHOUT CONTRAST TECHNIQUE: Multiplanar multisequence MR imaging was performed without the administration of intravenous contrast. COMPARISON:  CT scan 09/08/2019. FINDINGS: Markedly limited study. Lower chest: Moderate bilateral effusions.  The heart is enlarged. Hepatobiliary: 7.6 x 5.4 x 4.5 cm heterogeneous mainly T2 hyperintense lesion identified in the medial right liver, tracking up from the region of the porta hepatis/gallbladder fossa. Relationship to the right portal vein not well demonstrated. Multiple stones noted in the gallbladder. No extrahepatic biliary duct dilatation. Pancreas: Numerous cystic lesions are noted in the pancreatic parenchyma measuring up to maximum size of approximately 1.2 cm. No dilatation of the main duct. Spleen:  No splenomegaly. No focal mass lesion. Adrenals/Urinary Tract: No adrenal nodule or mass. No hydronephrosis. Stomach/Bowel: No small bowel or colonic dilatation within the visualized abdomen. Vascular/Lymphatic: No abdominal aortic aneurysm. No abdominal lymphadenopathy. Other:  No substantial free fluid Musculoskeletal: No suspicious marrow signal abnormality. IMPRESSION: Markedly limited study due severe motion degradation, inability to administer intravenous contrast material, and patient being unable to complete the exam. Only 4 pulse sequences were obtained. 8 x 5 x 5 cm heterogeneous mainly T2  hyperintense lesion in the central liver.  This cannot be further characterized given lack of intravenous contrast material and motion artifact. Tissue sampling may be warranted. Cholelithiasis. Electronically Signed   By: Misty Stanley M.D.   On: 09/15/2019 16:10   NM Hepato W/EjeCT Fract  Result Date: 09/14/2019 CLINICAL DATA:  Fever EXAM: NUCLEAR MEDICINE HEPATOBILIARY IMAGING TECHNIQUE: Sequential images of the abdomen were obtained out to 60 minutes following intravenous administration of radiopharmaceutical. RADIOPHARMACEUTICALS:  5.2 mCi Tc-70m  Choletec IV COMPARISON:  Ultrasound 09/04/2019 FINDINGS: Prompt uptake and biliary excretion of activity by the liver. Activity passes into the small bowel compatible with patent common bile duct. There is non filling of the gallbladder. Due to the patient's hypotension, morphine was not administered. Delayed imaging to 2 hours was performed with continued non filling of the gallbladder. IMPRESSION: Non filling of the gallbladder despite delayed imaging out to 2 hours. Findings concerning for cystic duct obstruction. Common bile duct is patent. Electronically Signed   By: Rolm Baptise M.D.   On: 09/14/2019 15:58   IR Perc Cholecystostomy  Result Date: 09/16/2019 CLINICAL DATA:  Recurrent calculus cholecystitis. Patient has had 2 previous cholecystostomy catheters. EXAM: PERCUTANEOUS CHOLECYSTOSTOMY TUBE PLACEMENT WITH ULTRASOUND AND FLUOROSCOPIC GUIDANCE FLUOROSCOPY TIME:  1.2 minute; 92 uGym2 DAP TECHNIQUE: The procedure, risks (including but not limited to bleeding, infection, organ damage ), benefits, and alternatives were explained to the patient and son. Questions regarding the procedure were encouraged and answered. The son understands and consents to the procedure. Survey ultrasound of the abdomen was performed and an appropriate skin entry site was identified. Skin site was marked, prepped with chlorhexidine, and draped in usual sterile fashion, and  infiltrated locally with 1% lidocaine. Intravenous Fentanyl 64mcg and Versed 0.5mg  were administered as conscious sedation during continuous monitoring of the patient's level of consciousness and physiological / cardiorespiratory status by the radiology RN, with a total moderate sedation time of 12 minutes. Under real-time ultrasound guidance, gallbladder was accessed using a transhepatic approach with a 21-gauge needle. Ultrasound image documentation was saved. Bile returned through the hub. Needle was exchanged over a 018 guidewire for transitional dilator which allowed placement of 035 J wire. Over this, a 10.2 French pigtail catheter was advanced and formed centrally in the gallbladder lumen. 10 mL of orange opaque fluid were aspirated, sent for Gram stain and culture. Small contrast injection confirmed appropriate position. Catheter secured externally with 0 Prolene suture and placed external drain bag. Patient tolerated the procedure well. COMPLICATIONS: COMPLICATIONS none IMPRESSION: 1. Technically successful percutaneous cholecystostomy tube placement with ultrasound and fluoroscopic guidance. Electronically Signed   By: Lucrezia Europe M.D.   On: 09/16/2019 13:53   DG Chest Port 1 View  Result Date: 09/02/2019 CLINICAL DATA:  Weakness EXAM: PORTABLE CHEST 1 VIEW COMPARISON:  09/04/2019 FINDINGS: Cardiomediastinal contours are stable accounting for portable technique and are partially obscured by graded opacity in the left and right chest. Signs of coronary stenting are noted along the left heart border. Increased interstitial markings are noted bilaterally without focal consolidation aside from obscuration of right hemidiaphragm. Spinal degenerative changes. Visualized skeletal structures otherwise unremarkable. IMPRESSION: 1. Probable new bilateral effusions and mild interstitial edema. Electronically Signed   By: Zetta Bills M.D.   On: 09/14/2019 15:33   US Abdomen Limited RUQ  Result Date:  09/20/2019 CLINICAL DATA:  Right upper quadrant pain EXAM: ULTRASOUND ABDOMEN LIMITED RIGHT UPPER QUADRANT COMPARISON:  CT same day FINDINGS: Gallbladder: There are multiple shadowing gallstones filling the gallbladder the largest measuring 1.8 cm. There  is a diffusely thickened gallbladder wall measuring 6.3 mm. No sonographic Percell Miller sign is seen. Common bile duct: Diameter: 2 mm. Liver: There is a heterogeneous appearance to the liver parenchyma. The hypodense mass seen on CT same day is not well visualized on this exam. Portal vein is patent on color Doppler imaging with normal direction of blood flow towards the liver. Other: None. IMPRESSION: Cholelithiasis with diffuse gallbladder wall thickening which could be suggestive of acute cholecystitis. Heterogeneous liver parenchyma. The ill-defined mass on recent CT is not well visualized on this examination. Would recommend CT with contrast or MRI for further evaluation. Electronically Signed   By: Prudencio Pair M.D.   On: 09/09/2019 22:58    Labs:  CBC: Recent Labs    09/14/19 0333 09/15/19 0156 09/16/19 0228 09/17/19 0403  WBC 28.3* 23.4* 17.3* 16.2*  HGB 9.0* 10.5* 9.2* 9.5*  HCT 26.5* 30.8* 26.5* 28.8*  PLT 378 379 349 358    COAGS: Recent Labs    09/20/2019 1916  INR 1.1    BMP: Recent Labs    09/14/19 0333 09/15/19 0156 09/16/19 0228 09/17/19 0403  NA 147* 147* 147* 150*  K 3.8 4.2 3.7 4.0  CL 109 108 115* 116*  CO2 23 24 23  19*  GLUCOSE 127* 233* 201* 307*  BUN 60* 54* 47* 41*  CALCIUM 9.2 9.5 9.2 9.1  CREATININE 2.30* 2.31* 2.15* 2.02*  GFRNONAA 24* 24* 26* 28*  GFRAA 28* 28* 31* 33*    LIVER FUNCTION TESTS: Recent Labs    09/04/19 1608 09/04/2019 1543 09/14/19 0333 09/15/19 0156  BILITOT 2.3* 1.4* 1.3* 1.2  AST 23 39 22 25  ALT 13 30 30 29   ALKPHOS 39 95 95 96  PROT 6.8 5.5* 5.7* 6.2*  ALBUMIN 4.2 2.5* 2.5* 2.7*    Assessment and Plan: Pt with hx recurrent calculous cholecystitis, poor surgical  candidate; s/p perc cholecystostomy 12/23; afebrile; WBC 16.2(17.3), hgb 9.5, creat 2.02(2.15), bile cx pend; cont drain irrigation; pt will need f/u cholangiogram/drain exchange in 6-8 weeks; other plans as per [facility] /[facility]   Electronically Signed: D. Rowe Robert, PA-C 09/17/2019, 1:40 PM   I spent a total of 15 minutes at the the patient's bedside AND on the patient's hospital floor or unit, greater than 50% of which was counseling/coordinating care for gallbladder drain    Patient ID: Robert [name], male   DOB: 09/30/1927, 85 y.o.   MRN: [id number]

## 2019-09-17 NOTE — Plan of Care (Signed)

## 2019-09-17 NOTE — Telephone Encounter (Signed)
WOuld see if he has had any rhythm issues in the hospital.  If no issues there and no further symptoms, would hold off on monitor.  JV

## 2019-09-17 NOTE — Plan of Care (Signed)
  Problem: Education: Goal: Knowledge of General Education information will improve Description Including pain rating scale, medication(s)/side effects and non-pharmacologic comfort measures Outcome: Progressing   

## 2019-09-17 NOTE — Care Management Important Message (Signed)
Important Message  Patient Details  Name: Robert [name] MRN: [id number] Date of Birth: 1928/08/21   Medicare Important Message Given:  Yes     Chaquita Basques [name] 09/17/2019, 11:32 AM

## 2019-09-17 NOTE — Progress Notes (Addendum)
[facility]  PROGRESS NOTE  Robert [name] ONG:[id number] DOB: Jan 07, 1928 DOA: 09/02/2019 PCP: [name], MD Admit date - 09/11/2019   Admitting Physician Shela Leff, MD  Outpatient Primary MD for the patient is [name], MD  LOS - 4 Brief Narrative   Robert [name] is a 85 y.o. year old male with medical history significant for coronary artery disease status post PCI on Plavix, ischemic cardiomyopathy with EF 35 to 40%, paroxysmal A. fib not on no oral anticoagulation, mitral regurgitation, hyperlipidemia, cholecystitis in 2016 status post percutaneous drain which was removed in April 2019, CKD stage IV, prostate cancer status post radiation, PVD, type 2 diabetes, recent hospitalization from 12/11-12/12 for evaluation of atypical chest pain presumed to be noncardiac based on work-up at the [facility] presented on 09/24/2019 with confusion and was found to have fever, white count, lactic acidosis, CT consistent with right hepatic lobe mass concerning for malignancy, mild gallbladder wall thickening.  Patient was admitted with a diagnosis of acute cholecystitis.  Empirically started on IV cefepime, Flagyl. Hospital course complicated by gallbladder contracted therefore not amenable for percutaneous access.  Patient underwent IR guided cholecystostomy on 12/23.    Subjective  Mr Dollinger today has just returned from receiving his cholecystostomy tube. Has no acute complaints.  A & P   Recurrent acute calculus cholecystitis, status post cholecystostomy placement 12/23 HIDA scan reviewed obstructed cystic duct, patient is a very poor surgical candidate Remains afebrile, leukocytosis is still downtrending -Continue to monitor output from cholecystostomy tube -Monitor intraoperative cultures from 12/23 -Continue empiric cefepime and Flagyl -Continue drain irrigation, will need cholangiogram/drain exchanges in 6 weeks per IR  GPC in 1 of 4 blood cultures, concern  for bacteremia, most likely strep given in clusters) -Continue empiric cefepime -IV Flagyl given anaerobic coverage -Encouraged patient is afebrile, leukocytosis downtrending -Monitor blood culture sensitivities  Right hepatic lobe mass, concerning for malignancy MRI liver without contrast shows hyperintense lesion in the central liver, unable to further characterize given lack of IV contrast -Tissue sampling may be warranted  Combined systolic and diastolic CHF, euvolemic on exam -Holding home Lasix -Monitor daily weights, volume status  Paroxysmal atrial fibrillation Followed by cardiology has declined anticoagulation in the past -Resume home Plavix -Continue metoprolol at reduced dose of 12.5 mg, given relative hypotension  Acute metabolic encephalopathy Likely related to infection above, hypernatremia likely also contributing. CT head non acute, does show atrophy and chronic ischemic vessel disease as well as remote R cerebellar infarct, likely some dementia at baseline. Alert and only oriented to self currently at baseline report is alert and oriented x4--and confirmed on recent documentation from recent hospitalization from 12 11-12/12. -Continue close monitor mental status -Avoid sedatives -Correct electrolyte abnormalities delirium precautions  Hypernatremia, related to diminished p.o. intake in setting of n.p.o. status, worsening 147--150 -Allow soft diet -Continue D5 half-normal saline-monitor BMP  CKD stage IV, stable Baseline creatinine around 2.1 -avoid nephrotoxins, monitor urine output, daily BMP  Macrocytic anemia, chronic hemoglobin stable at baseline 9-10  Type 2 diabetes A1c 6.5 (07/2019) Home regimen includes: Lantus 16 units daily, lispro 9 units 3 times daily FBG/CBGs in 300s, likely related to D5 drip for hypernatremia while patient was n.p.o. -Start sliding scale now that he is eating and scheduled Lantus at 10 units with close monitoring of  CBGs  Hyperlipidemia LFTs wnl Resume home statin  Goals of care Based off recent documentation it seems patient was well functioning prior to this hospital stay Only alert to  self currently, this could be attributed to active medical condition in setting of chronic medical conditions including CAD, ischemic c cardiomyopathy, A. fib, moderate/severe MR, stage IV CKD Hopeful that this will improve -Consulted palliative care to assist with goals of care discussion -I will also talk with him myself to further elicit  Family Communication  : updated son Robert [name] at [phone] on 12/24.   Code Status : Full  Disposition Plan  : IV cefepime, Flagyl, close monitoring of abdominal exam with percutaneous drain in place  Consults  : General surgery, IR, palliaitive  Procedures  :  12/20 right upper quadrant ultrasound-suggestive of acute cholecystitis, diffuse gallbladder wall thickening,  12/21 HIDA scan nonfilling of the gallbladder despite delayed imaging, concerning for cystic duct obstruction, common bile duct patent 12/23 percutaneous cholecystostomy tube placed by IR  DVT Prophylaxis  : Heparin  Lab Results  Component Value Date   PLT 358 09/17/2019    Diet :  Diet Order            DIET SOFT Room service appropriate? Yes with Assist; Fluid consistency: Thin  Diet effective now               Inpatient Medications Scheduled Meds: . ferrous sulfate  325 mg Oral Q breakfast  . heparin injection (subcutaneous)  5,000 Units Subcutaneous Q8H  . insulin aspart  0-9 Units Subcutaneous TID WC  . insulin glargine  10 Units Subcutaneous q morning - 10a  . metoprolol succinate  12.5 mg Oral Daily  . senna-docusate  2 tablet Oral BID  . sodium chloride flush  5 mL Intracatheter Q8H   Continuous Infusions: . ceFEPime (MAXIPIME) IV Stopped (09/16/19 2017)  . dextrose 5 % and 0.45% NaCl Stopped (09/16/19 2241)  . metronidazole 500 mg (09/17/19 0529)   PRN Meds:.acetaminophen  **OR** acetaminophen, HYDROcodone-acetaminophen, LORazepam, oxyCODONE, polyethylene glycol  Antibiotics  :   Anti-infectives (From admission, onward)   Start     Dose/Rate Route Frequency Ordered Stop   09/15/19 2030  vancomycin (VANCOCIN) IVPB 1000 mg/200 mL premix  Status:  Discontinued     1,000 mg 200 mL/hr over 60 Minutes Intravenous Every 48 hours 09/19/2019 1923 09/14/19 0315   09/14/19 1930  ceFEPIme (MAXIPIME) 2 g in sodium chloride 0.9 % 100 mL IVPB     2 g 200 mL/hr over 30 Minutes Intravenous Every 24 hours 09/18/2019 1921     09/14/19 0600  metroNIDAZOLE (FLAGYL) IVPB 500 mg     500 mg 100 mL/hr over 60 Minutes Intravenous Every 8 hours 09/14/19 0315     09/05/2019 1930  vancomycin (VANCOREADY) IVPB 1250 mg/250 mL     1,250 mg 166.7 mL/hr over 90 Minutes Intravenous  Once 09/14/2019 1921 09/11/2019 2304   08/25/2019 1900  ceFEPIme (MAXIPIME) 2 g in sodium chloride 0.9 % 100 mL IVPB     2 g 200 mL/hr over 30 Minutes Intravenous  Once 08/31/2019 1852 09/16/2019 2024   09/14/2019 1900  metroNIDAZOLE (FLAGYL) IVPB 500 mg     500 mg 100 mL/hr over 60 Minutes Intravenous  Once 08/29/2019 1852 09/02/2019 2041   08/27/2019 1900  vancomycin (VANCOCIN) IVPB 1000 mg/200 mL premix  Status:  Discontinued     1,000 mg 200 mL/hr over 60 Minutes Intravenous  Once 09/18/2019 1852 09/17/2019 1921       Objective   Vitals:   09/16/19 1501 09/16/19 2231 09/17/19 0500 09/17/19 0522  BP: 106/72 137/73  106/61  Pulse: 74 79  73  Resp: 20 15  (!) 22  Temp: 98.2 F (36.8 C) 97.8 F (36.6 C)  97.7 F (36.5 C)  TempSrc: Oral Oral  Oral  SpO2: 98% 90%  92%  Weight:   71.7 kg     SpO2: 92 % O2 Flow Rate (L/min): 2 L/min  Wt Readings from Last 3 Encounters:  09/17/19 71.7 kg  09/05/19 67 kg  08/03/19 70.7 kg     Intake/Output Summary (Last 24 hours) at 09/17/2019 1232 Last data filed at 09/17/2019 1230 Gross per 24 hour  Intake 841.99 ml  Output 375 ml  Net 466.99 ml    Physical Exam:  Awake  Alert, oriented to self only No new F.N deficits,  [place].AT, Symmetrical Chest wall movement, Good air movement bilaterally, CTAB Cholecystostomy tube in place draining bilious fluid, no abdominal tenderness with palpation, diminished bowel sounds RRR,No Gallops,Rubs or new Murmurs,    I have personally reviewed the following:   Data Reviewed:  CBC Recent Labs  Lab 09/14/2019 1632 09/14/19 0333 09/15/19 0156 09/16/19 0228 09/17/19 0403  WBC 20.8* 28.3* 23.4* 17.3* 16.2*  HGB 7.8* 9.0* 10.5* 9.2* 9.5*  HCT 22.5* 26.5* 30.8* 26.5* 28.8*  PLT 374 378 379 349 358  MCV 103.2* 103.1* 102.7* 101.1* 104.7*  MCH 35.8* 35.0* 35.0* 35.1* 34.5*  MCHC 34.7 34.0 34.1 34.7 33.0  RDW 23.0* 23.1* 22.6* 22.4* 23.4*    Chemistries  Recent Labs  Lab 09/16/2019 1543 09/14/19 0333 09/15/19 0156 09/16/19 0228 09/17/19 0403  NA 143 147* 147* 147* 150*  K 4.6 3.8 4.2 3.7 4.0  CL 111 109 108 115* 116*  CO2 20* 23 24 23  19*  GLUCOSE 98 127* 233* 201* 307*  BUN 68* 60* 54* 47* 41*  CREATININE 2.19* 2.30* 2.31* 2.15* 2.02*  CALCIUM 8.6* 9.2 9.5 9.2 9.1  MG  --  2.4  --   --   --   AST 39 22 25  --   --   ALT 30 30 29   --   --   ALKPHOS 95 95 96  --   --   BILITOT 1.4* 1.3* 1.2  --   --    ------------------------------------------------------------------------------------------------------------------ No results for input(s): CHOL, HDL, LDLCALC, TRIG, CHOLHDL, LDLDIRECT in the last 72 hours.  Lab Results  Component Value Date   HGBA1C 6.5 (A) 08/03/2019   ------------------------------------------------------------------------------------------------------------------ No results for input(s): TSH, T4TOTAL, T3FREE, THYROIDAB in the last 72 hours.  Invalid input(s): FREET3 ------------------------------------------------------------------------------------------------------------------ No results for input(s): VITAMINB12, FOLATE, FERRITIN, TIBC, IRON, RETICCTPCT in the last 72  hours.  Coagulation profile Recent Labs  Lab 09/12/2019 1916  INR 1.1    No results for input(s): DDIMER in the last 72 hours.  Cardiac Enzymes No results for input(s): CKMB, TROPONINI, MYOGLOBIN in the last 168 hours.  Invalid input(s): CK ------------------------------------------------------------------------------------------------------------------    Component Value Date/Time   BNP 874.0 (H) 09/14/2019 4854    Micro Results Recent Results (from the past 240 hour(s))  Blood culture (routine x 2)     Status: None (Preliminary result)   Collection Time: 09/12/2019  4:32 PM   Specimen: BLOOD RIGHT ARM  Result Value Ref Range Status   Specimen Description BLOOD RIGHT ARM  Final   Special Requests   Final    BOTTLES DRAWN AEROBIC AND ANAEROBIC Blood Culture results may not be optimal due to an inadequate volume of blood received in culture bottles   Culture   Final    NO GROWTH 4  DAYS Performed at [facility], [place] [address] [place], [place] [address]    Report Status PENDING  Incomplete  Blood culture (routine x 2)     Status: None (Preliminary result)   Collection Time: 09/10/2019  7:10 PM   Specimen: BLOOD  Result Value Ref Range Status   Specimen Description BLOOD LEFT ANTECUBITAL  Final   Special Requests   Final    BOTTLES DRAWN AEROBIC AND ANAEROBIC Blood Culture adequate volume   Culture   Final    NO GROWTH 4 DAYS Performed at [facility], [place] [address], [place], [place] [address]    Report Status PENDING  Incomplete  Respiratory Panel by RT PCR (Flu A&B, Covid) - Nasopharyngeal Swab     Status: None   Collection Time: 09/04/2019  8:10 PM   Specimen: Nasopharyngeal Swab  Result Value Ref Range Status   SARS Coronavirus 2 by RT PCR NEGATIVE NEGATIVE Final    Comment: (NOTE) SARS-CoV-2 target nucleic acids are NOT DETECTED. The SARS-CoV-2 RNA is generally detectable in upper respiratoy specimens during the acute phase of infection. The  lowest concentration of SARS-CoV-2 viral copies this assay can detect is 131 copies/mL. A negative result does not preclude SARS-Cov-2 infection and should not be used as the sole basis for treatment or other patient management decisions. A negative result may occur with  improper specimen collection/handling, submission of specimen other than nasopharyngeal swab, presence of viral mutation(s) within the areas targeted by this assay, and inadequate number of viral copies (<131 copies/mL). A negative result must be combined with clinical observations, patient history, and epidemiological information. The expected result is Negative. Fact Sheet for Patients:  PinkCheek.be Fact Sheet for Healthcare Providers:  GravelBags.it This test is not yet ap proved or cleared by the [place] FDA and  has been authorized for detection and/or diagnosis of SARS-CoV-2 by FDA under an Emergency Use Authorization (EUA). This EUA will remain  in effect (meaning this test can be used) for the duration of the COVID-19 declaration under Section 564(b)(1) of the Act, 21 U.S.C. section 360bbb-3(b)(1), unless the authorization is terminated or revoked sooner.    Influenza A by PCR NEGATIVE NEGATIVE Final   Influenza B by PCR NEGATIVE NEGATIVE Final    Comment: (NOTE) The Xpert Xpress SARS-CoV-2/FLU/RSV assay is intended as an aid in  the diagnosis of influenza from Nasopharyngeal swab specimens and  should not be used as a sole basis for treatment. Nasal washings and  aspirates are unacceptable for Xpert Xpress SARS-CoV-2/FLU/RSV  testing. Fact Sheet for Patients: PinkCheek.be Fact Sheet for Healthcare Providers: GravelBags.it This test is not yet approved or cleared by the [place] FDA and  has been authorized for detection and/or diagnosis of SARS-CoV-2 by  FDA under an Emergency  Use Authorization (EUA). This EUA will remain  in effect (meaning this test can be used) for the duration of the  Covid-19 declaration under Section 564(b)(1) of the Act, 21  U.S.C. section 360bbb-3(b)(1), unless the authorization is  terminated or revoked. Performed at [facility], [place] [address] [place], [place] [address]   Aerobic/Anaerobic Culture (surgical/deep wound)     Status: None (Preliminary result)   Collection Time: 09/16/19 11:44 AM   Specimen: Wound; Bile  Result Value Ref Range Status   Specimen Description WOUND  Final   Special Requests NONE  Final   Gram Stain   Final    FEW WBC PRESENT, PREDOMINANTLY PMN MODERATE GRAM  POSITIVE COCCI IN PAIRS IN CHAINS FEW GRAM POSITIVE RODS FEW GRAM NEGATIVE RODS    Culture   Final    CULTURE REINCUBATED FOR BETTER GROWTH Performed at [facility], [place] [address], [place], [place] [address]    Report Status PENDING  Incomplete    Radiology Reports CT ABDOMEN PELVIS WO CONTRAST  Result Date: 09/18/2019 CLINICAL DATA:  Abdominal pain and fever. The patient has a history of diabetes and prostate cancer. EXAM: CT ABDOMEN AND PELVIS WITHOUT CONTRAST TECHNIQUE: Multidetector CT imaging of the abdomen and pelvis was performed following the standard protocol without IV contrast. COMPARISON:  February 28, 2017 FINDINGS: Lower chest: There is a moderate sized partially visualized right-sided pleural effusion. There is a small left-sided pleural effusion.The heart size is enlarged. There is a trace pericardial effusion. The intracardiac blood pool is hypodense relative to the adjacent myocardium consistent with anemia. Atelectasis is noted at the lung bases bilaterally. Hepatobiliary: There is I a hypoattenuating mass in the right hepatic lobe measuring approximately 5.1 x 4.1 by 6.9 cm. This is new since the prior study in 2018. There is cholelithiasis with some mild gallbladder wall thickening.There is no biliary ductal dilation.  Pancreas: Normal contours without ductal dilatation. No peripancreatic fluid collection. Spleen: No splenic laceration or hematoma. Adrenals/Urinary Tract: --Adrenal glands: No adrenal hemorrhage. --Right kidney/ureter: The right kidney is atrophic. --Left kidney/ureter: The left kidney is atrophic. --Urinary bladder: Unremarkable. Stomach/Bowel: --Stomach/Duodenum: No hiatal hernia or other gastric abnormality. Normal duodenal course and caliber. --Small bowel: No dilatation or inflammation. --Colon: Rectosigmoid diverticulosis without acute inflammation. --Appendix: Normal. Vascular/Lymphatic: Atherosclerotic calcification is present within the non-aneurysmal abdominal aorta, without hemodynamically significant stenosis. --No retroperitoneal lymphadenopathy. --No mesenteric lymphadenopathy. --No pelvic or inguinal lymphadenopathy. Reproductive: Unremarkable Other: No ascites or free air. The patient is status post bilateral inguinal hernia repair. Musculoskeletal. No acute displaced fractures. IMPRESSION: 1. New hypoattenuating mass in the right hepatic lobe, concerning for malignancy. This is suboptimally evaluated on this exam. Further evaluation with a contrast enhanced multiphase MRI or CT is recommended. 2. Bilateral pleural effusions, right greater than left. Given the above finding in the liver, consider further evaluation with a contrast enhanced CT of the chest. 3. Cardiomegaly and trace pericardial effusion. 4. Cholelithiasis with mild gallbladder wall thickening. If there is concern for acute cholecystitis, recommend further evaluation with right upper quadrant ultrasound. 5. Rectosigmoid diverticulosis without acute inflammation. Aortic Atherosclerosis (ICD10-I70.0). Electronically Signed   By: Constance Holster M.D.   On: 08/28/2019 21:23   CT Head Wo Contrast  Result Date: 09/10/2019 CLINICAL DATA:  85 year old male with altered mental status and increased weakness. EXAM: CT HEAD WITHOUT  CONTRAST TECHNIQUE: Contiguous axial images were obtained from the base of the skull through the vertex without intravenous contrast. COMPARISON:  None. FINDINGS: Brain: No evidence of acute infarction, hemorrhage, hydrocephalus, extra-axial collection or mass lesion/mass effect. Atrophy, chronic small-vessel white matter ischemic changes and remote RIGHT cerebellar infarct noted. Vascular: Carotid and vertebral atherosclerotic calcifications noted Skull: No acute abnormality. Sinuses/Orbits: No acute abnormality Other: None IMPRESSION: 1. No evidence of acute intracranial abnormality. 2. Atrophy, chronic small-vessel white matter ischemic changes and remote RIGHT cerebellar infarct. Electronically Signed   By: Margarette Canada M.D.   On: 08/25/2019 16:00   MR LIVER WO CONRTAST  Result Date: 09/15/2019 CLINICAL DATA:  Liver mass. EXAM: MRI ABDOMEN WITHOUT CONTRAST TECHNIQUE: Multiplanar multisequence MR imaging was performed without the administration of intravenous contrast. COMPARISON:  CT scan 09/05/2019. FINDINGS: Markedly  limited study. Lower chest: Moderate bilateral effusions.  The heart is enlarged. Hepatobiliary: 7.6 x 5.4 x 4.5 cm heterogeneous mainly T2 hyperintense lesion identified in the medial right liver, tracking up from the region of the porta hepatis/gallbladder fossa. Relationship to the right portal vein not well demonstrated. Multiple stones noted in the gallbladder. No extrahepatic biliary duct dilatation. Pancreas: Numerous cystic lesions are noted in the pancreatic parenchyma measuring up to maximum size of approximately 1.2 cm. No dilatation of the main duct. Spleen:  No splenomegaly. No focal mass lesion. Adrenals/Urinary Tract: No adrenal nodule or mass. No hydronephrosis. Stomach/Bowel: No small bowel or colonic dilatation within the visualized abdomen. Vascular/Lymphatic: No abdominal aortic aneurysm. No abdominal lymphadenopathy. Other:  No substantial free fluid Musculoskeletal: No  suspicious marrow signal abnormality. IMPRESSION: Markedly limited study due severe motion degradation, inability to administer intravenous contrast material, and patient being unable to complete the exam. Only 4 pulse sequences were obtained. 8 x 5 x 5 cm heterogeneous mainly T2 hyperintense lesion in the central liver. This cannot be further characterized given lack of intravenous contrast material and motion artifact. Tissue sampling may be warranted. Cholelithiasis. Electronically Signed   By: Misty Stanley M.D.   On: 09/15/2019 16:10   NM Hepato W/EjeCT Fract  Result Date: 09/14/2019 CLINICAL DATA:  Fever EXAM: NUCLEAR MEDICINE HEPATOBILIARY IMAGING TECHNIQUE: Sequential images of the abdomen were obtained out to 60 minutes following intravenous administration of radiopharmaceutical. RADIOPHARMACEUTICALS:  5.2 mCi Tc-39m  Choletec IV COMPARISON:  Ultrasound 09/12/2019 FINDINGS: Prompt uptake and biliary excretion of activity by the liver. Activity passes into the small bowel compatible with patent common bile duct. There is non filling of the gallbladder. Due to the patient's hypotension, morphine was not administered. Delayed imaging to 2 hours was performed with continued non filling of the gallbladder. IMPRESSION: Non filling of the gallbladder despite delayed imaging out to 2 hours. Findings concerning for cystic duct obstruction. Common bile duct is patent. Electronically Signed   By: Rolm Baptise M.D.   On: 09/14/2019 15:58   IR Perc Cholecystostomy  Result Date: 09/16/2019 CLINICAL DATA:  Recurrent calculus cholecystitis. Patient has had 2 previous cholecystostomy catheters. EXAM: PERCUTANEOUS CHOLECYSTOSTOMY TUBE PLACEMENT WITH ULTRASOUND AND FLUOROSCOPIC GUIDANCE FLUOROSCOPY TIME:  1.2 minute; 52 uGym2 DAP TECHNIQUE: The procedure, risks (including but not limited to bleeding, infection, organ damage ), benefits, and alternatives were explained to the patient and son. Questions regarding the  procedure were encouraged and answered. The son understands and consents to the procedure. Survey ultrasound of the abdomen was performed and an appropriate skin entry site was identified. Skin site was marked, prepped with chlorhexidine, and draped in usual sterile fashion, and infiltrated locally with 1% lidocaine. Intravenous Fentanyl 49mcg and Versed 0.5mg  were administered as conscious sedation during continuous monitoring of the patient's level of consciousness and physiological / cardiorespiratory status by the radiology RN, with a total moderate sedation time of 12 minutes. Under real-time ultrasound guidance, gallbladder was accessed using a transhepatic approach with a 21-gauge needle. Ultrasound image documentation was saved. Bile returned through the hub. Needle was exchanged over a 018 guidewire for transitional dilator which allowed placement of 035 J wire. Over this, a 10.2 French pigtail catheter was advanced and formed centrally in the gallbladder lumen. 10 mL of orange opaque fluid were aspirated, sent for Gram stain and culture. Small contrast injection confirmed appropriate position. Catheter secured externally with 0 Prolene suture and placed external drain bag. Patient tolerated the procedure well. COMPLICATIONS: COMPLICATIONS  none IMPRESSION: 1. Technically successful percutaneous cholecystostomy tube placement with ultrasound and fluoroscopic guidance. Electronically Signed   By: Lucrezia Europe M.D.   On: 09/16/2019 13:53   DG Chest Port 1 View  Result Date: 09/19/2019 CLINICAL DATA:  Weakness EXAM: PORTABLE CHEST 1 VIEW COMPARISON:  09/04/2019 FINDINGS: Cardiomediastinal contours are stable accounting for portable technique and are partially obscured by graded opacity in the left and right chest. Signs of coronary stenting are noted along the left heart border. Increased interstitial markings are noted bilaterally without focal consolidation aside from obscuration of right hemidiaphragm.  Spinal degenerative changes. Visualized skeletal structures otherwise unremarkable. IMPRESSION: 1. Probable new bilateral effusions and mild interstitial edema. Electronically Signed   By: Zetta Bills M.D.   On: 09/23/2019 15:33   DG Chest Port 1 View  Result Date: 09/04/2019 CLINICAL DATA:  85 year old male with chest pain and shortness of breath. EXAM: PORTABLE CHEST 1 VIEW COMPARISON:  Chest radiographs 02/28/2017 and earlier. FINDINGS: Portable AP upright view at 1437 hours. Lower lung volumes and kyphotic positioning. Mediastinal contours appear stable and within normal limits. Visualized tracheal air column is within normal limits. No definite acute pulmonary opacity. There is mild asymmetric chronic scarring in the right upper lobe. No pleural effusion or pneumothorax. No acute osseous abnormality identified. IMPRESSION: No acute cardiopulmonary abnormality identified. Electronically Signed   By: Genevie Ann M.D.   On: 09/04/2019 15:02   ECHOCARDIOGRAM COMPLETE  Result Date: 09/05/2019   ECHOCARDIOGRAM REPORT   Patient Name:   Robert [name] Date of Exam: 09/05/2019 Medical Rec #:  [id number]    Height:       69.0 in Accession #:    [id number]   Weight:       147.6 lb Date of Birth:  [birth year]   BSA:          1.82 m Patient Age:    59 years     BP:           149/63 mmHg Patient Gender: M            HR:           94 bpm. Exam Location:  Inpatient Procedure: 2D Echo, Cardiac Doppler and Color Doppler Indications:    R07.9* Chest pain, unspecified  History:        Patient has prior history of Echocardiogram examinations, most                 recent 03/02/2017. Cardiomyopathy, CAD; Risk Factors:Hypertension                 and Diabetes. CKD. Heart Block. Cancer.  Sonographer:    Jonelle Sidle Dance Referring Phys: [place]  1. Left ventricular ejection fraction, by visual estimation, is 35 to 40%. The left ventricle has mildly decreased function. There is mildly increased left ventricular  hypertrophy.  2. Elevated left atrial and left ventricular end-diastolic pressures.  3. Left ventricular diastolic parameters are consistent with Grade II diastolic dysfunction (pseudonormalization).  4. The left ventricle demonstrates global hypokinesis.  5. Global right ventricle has low normal systolic function.The right ventricular size is normal. No increase in right ventricular wall thickness.  6. Left atrial size was moderately dilated.  7. Right atrial size was normal.  8. The mitral valve is abnormal. Mild mitral valve regurgitation.  9. The tricuspid valve is grossly normal. Tricuspid valve regurgitation is mild. 10. The aortic valve is tricuspid. Aortic valve regurgitation is trivial. Mild aortic valve  sclerosis without stenosis. 11. The pulmonic valve was grossly normal. Pulmonic valve regurgitation is trivial. 12. Moderately elevated pulmonary artery systolic pressure. 13. The inferior vena cava is normal in size with <50% respiratory variability, suggesting right atrial pressure of 8 mmHg. 14. The interatrial septum appears to be lipomatous. FINDINGS  Left Ventricle: Left ventricular ejection fraction, by visual estimation, is 35 to 40%. The left ventricle has mildly decreased function. The left ventricle demonstrates global hypokinesis. There is mildly increased left ventricular hypertrophy. Left ventricular diastolic parameters are consistent with Grade II diastolic dysfunction (pseudonormalization). Elevated left atrial and left ventricular end-diastolic pressures. Right Ventricle: The right ventricular size is normal. No increase in right ventricular wall thickness. Global RV systolic function is has low normal systolic function. The tricuspid regurgitant velocity is 3.08 m/s, and with an assumed right atrial pressure of 8 mmHg, the estimated right ventricular systolic pressure is moderately elevated at 45.9 mmHg. Left Atrium: Left atrial size was moderately dilated. Right Atrium: Right atrial size  was normal in size Pericardium: There is no evidence of pericardial effusion. Mitral Valve: The mitral valve is abnormal. There is mild thickening of the mitral valve leaflet(s). Mild mitral valve regurgitation. Tricuspid Valve: The tricuspid valve is grossly normal. Tricuspid valve regurgitation is mild. Aortic Valve: The aortic valve is tricuspid. Aortic valve regurgitation is trivial. Aortic regurgitation PHT measures 338 msec. Mild aortic valve sclerosis is present, with no evidence of aortic valve stenosis. Pulmonic Valve: The pulmonic valve was grossly normal. Pulmonic valve regurgitation is trivial. Pulmonic regurgitation is trivial. Aorta: The aortic root and ascending aorta are structurally normal, with no evidence of dilitation. Venous: The inferior vena cava is normal in size with less than 50% respiratory variability, suggesting right atrial pressure of 8 mmHg. IAS/Shunts: Increased thickness of the atrial septum sparing the fossa ovalis consistent with The interatrial septum appears to be lipomatous. No atrial level shunt detected by color flow Doppler.  LEFT VENTRICLE PLAX 2D LVIDd:         5.56 cm  Diastology LVIDs:         4.29 cm  LV e' lateral:   9.68 cm/s LV PW:         1.13 cm  LV E/e' lateral: 11.0 LV IVS:        0.77 cm  LV e' medial:    7.94 cm/s LVOT diam:     2.00 cm  LV E/e' medial:  13.4 LV SV:         69 ml LV SV Index:   37.99 LVOT Area:     3.14 cm  RIGHT VENTRICLE             IVC RV Basal diam:  2.72 cm     IVC diam: 1.81 cm RV S prime:     10.60 cm/s TAPSE (M-mode): 1.1 cm LEFT ATRIUM             Index       RIGHT ATRIUM           Index LA diam:        4.60 cm 2.53 cm/m  RA Area:     20.50 cm LA Vol (A2C):   85.7 ml 47.20 ml/m RA Volume:   60.10 ml  33.10 ml/m LA Vol (A4C):   68.4 ml 37.67 ml/m LA Biplane Vol: 76.5 ml 42.13 ml/m  AORTIC VALVE LVOT Vmax:   74.60 cm/s LVOT Vmean:  46.450 cm/s LVOT VTI:    0.114 m  AI PHT:      338 msec  AORTA Ao Root diam: 3.90 cm Ao Asc diam:   3.60 cm MITRAL VALVE                         TRICUSPID VALVE MV Area (PHT): 4.49 cm              TR Peak grad:   37.9 mmHg MV PHT:        49.01 msec            TR Vmax:        330.00 cm/s MV Decel Time: 169 msec MV E velocity: 106.00 cm/s 103 cm/s  SHUNTS MV A velocity: 56.30 cm/s  70.3 cm/s Systemic VTI:  0.11 m MV E/A ratio:  1.88        1.5       Systemic Diam: 2.00 cm  Lyman Bishop MD Electronically signed by Lyman Bishop MD Signature Date/Time: 09/05/2019/2:30:01 PM    Final    US Abdomen Limited RUQ  Result Date: 09/11/2019 CLINICAL DATA:  Right upper quadrant pain EXAM: ULTRASOUND ABDOMEN LIMITED RIGHT UPPER QUADRANT COMPARISON:  CT same day FINDINGS: Gallbladder: There are multiple shadowing gallstones filling the gallbladder the largest measuring 1.8 cm. There is a diffusely thickened gallbladder wall measuring 6.3 mm. No sonographic Percell Miller sign is seen. Common bile duct: Diameter: 2 mm. Liver: There is a heterogeneous appearance to the liver parenchyma. The hypodense mass seen on CT same day is not well visualized on this exam. Portal vein is patent on color Doppler imaging with normal direction of blood flow towards the liver. Other: None. IMPRESSION: Cholelithiasis with diffuse gallbladder wall thickening which could be suggestive of acute cholecystitis. Heterogeneous liver parenchyma. The ill-defined mass on recent CT is not well visualized on this examination. Would recommend CT with contrast or MRI for further evaluation. Electronically Signed   By: Prudencio Pair M.D.   On: 09/07/2019 22:58     Time Spent in minutes  30     Desiree Hane M.D on 09/17/2019 at 12:32 PM  To page go to [email or web address] - password [facility]

## 2019-09-17 NOTE — Progress Notes (Signed)
Return Rick call to [phone] four times and not answer

## 2019-09-18 DIAGNOSIS — I480 Paroxysmal atrial fibrillation: Secondary | ICD-10-CM

## 2019-09-18 DIAGNOSIS — N184 Chronic kidney disease, stage 4 (severe): Secondary | ICD-10-CM

## 2019-09-18 DIAGNOSIS — G9341 Metabolic encephalopathy: Secondary | ICD-10-CM

## 2019-09-18 DIAGNOSIS — R160 Hepatomegaly, not elsewhere classified: Secondary | ICD-10-CM

## 2019-09-18 LAB — CULTURE, BLOOD (ROUTINE X 2)
Culture: NO GROWTH
Special Requests: ADEQUATE

## 2019-09-18 LAB — BASIC METABOLIC PANEL
Anion gap: 10 (ref 5–15)
BUN: 39 mg/dL — ABNORMAL HIGH (ref 8–23)
CO2: 21 mmol/L — ABNORMAL LOW (ref 22–32)
Calcium: 9 mg/dL (ref 8.9–10.3)
Chloride: 116 mmol/L — ABNORMAL HIGH (ref 98–111)
Creatinine, Ser: 2.35 mg/dL — ABNORMAL HIGH (ref 0.61–1.24)
GFR calc Af Amer: 27 mL/min — ABNORMAL LOW (ref >60)
GFR calc non Af Amer: 24 mL/min — ABNORMAL LOW (ref >60)
Glucose, Bld: 288 mg/dL — ABNORMAL HIGH (ref 70–99)
Potassium: 4.6 mmol/L (ref 3.5–5.1)
Sodium: 147 mmol/L — ABNORMAL HIGH (ref 135–145)

## 2019-09-18 LAB — GLUCOSE, CAPILLARY
Glucose-Capillary: 250 mg/dL — ABNORMAL HIGH (ref 70–99)
Glucose-Capillary: 252 mg/dL — ABNORMAL HIGH (ref 70–99)
Glucose-Capillary: 161 mg/dL — ABNORMAL HIGH (ref 70–99)
Glucose-Capillary: 124 mg/dL — ABNORMAL HIGH (ref 70–99)

## 2019-09-18 LAB — CBC
HCT: 30 % — ABNORMAL LOW (ref 39.0–52.0)
Hemoglobin: 9.9 g/dL — ABNORMAL LOW (ref 13.0–17.0)
MCH: 34.5 pg — ABNORMAL HIGH (ref 26.0–34.0)
MCHC: 33 g/dL (ref 30.0–36.0)
MCV: 104.5 fL — ABNORMAL HIGH (ref 80.0–100.0)
Platelets: 350 10*3/uL (ref 150–400)
RBC: 2.87 MIL/uL — ABNORMAL LOW (ref 4.22–5.81)
RDW: 23 % — ABNORMAL HIGH (ref 11.5–15.5)
WBC: 15.9 10*3/uL — ABNORMAL HIGH (ref 4.0–10.5)
nRBC: 0.4 % — ABNORMAL HIGH (ref 0.0–0.2)

## 2019-09-18 NOTE — Progress Notes (Signed)
Medical Provider M. Sharlet Salina  informed that patient has been agitated this shift and attempted to get out of bed multiple times this shift.   Patient is not combative and easily directed.  Patient does follow commands.  Ativan 0.5mg  given at 2044 to help with agitation.  No new orders given at this time.  Will continue to monitor patient.  Provider M. Sharlet Salina  has asked that O2 sats be taken.  02 sats 98% on RA.

## 2019-09-18 NOTE — Consult Note (Addendum)
Consultation Note Date: 09/18/2019   Patient Name: Robert [name]  DOB: August 09, 1928  MRN: [id number]  Age / Sex: 85 y.o., male  PCP: [name], MD Referring Physician: Desiree Hane, MD  Reason for Consultation: Establishing goals of care  HPI/Patient Profile: 85 y.o. male  with past medical history of prostate CA sp 40 radiation treatments, ICM and complete heart block s/p ICD placement, CKD IV, DM, mitral regurg, and mixed heart failure who was admitted on 09/09/2019 with recurrent cholecystitis.  He is a poor surgical candidate consequently a cholecystomy tube was placed.  Cultures appear to be positive for strep.  Mr. Allman is improving overall but his creatine is climbing and he currently remains encephalopathic.   Clinical Assessment and Goals of Care:  I have reviewed medical records including EPIC notes, labs and imaging, received report from Dr. Lonny Prude, assessed the patient and then met at the bedside along with his son [name]  to discuss diagnosis prognosis, [place], EOL wishes, disposition and options.  I introduced Palliative Medicine as specialized medical care for people living with serious illness. It focuses on providing relief from the symptoms and stress of a serious illness. The goal is to improve quality of life for both the patient and the family.  We discussed a brief life review of the patient.  He was in the [employer] and worked for [employer] order for 39 years before retiring.  His wife of 23 years passed away from breast cancer 5 years ago.  He has two sons and lives with [name] who is his full time care taker.  Per [name] his father has remained very active.  He push mows the lawn daily.  In the fall when leaves begin to fall Mr. Accomando cleans up the leaves daily. Until last year Mr. Lemmons climbed a latter and stood on the roof to blow out his gutters with a gas powered mower.  Mr. Keesling  loves the slots - quality of life for him is going to [facility] and being able to play the slot machines.  Even during [place] since [employer] has reopened he and [name] has gone weekly.   Per [name] has father has battled cholecystitis before.  He had a cholecystostomy tube in place for two years and it was just removed 6 months ago.  [name] said his father was not bothered by the tube.  He wishes it had just stayed in place.    We discussed his father's medical history and co-morbidities.  We talked about support in the home.  [name] is accepting of home health RN, PT, OT and Palliative.  But he is not yet accepting of Hospice.  We talked about code status change - while [name] seems realistic about his father's health status he wishes for him to remain a full code in the setting of cardiac or respiratory arrest.    [name] states my father is walking and getting about.  I will have to see for myself that he is unable to walk and enjoy  life then I will consider a change in code status and Hospice.     [name] states his brother Octavia Bruckner is a PT.  He wishes for Tim to come see and evaluate his father's condition.  The difference between aggressive medical intervention and comfort care was considered in light of the patient's goals of care.  At this point in time the patient's son wishes to pursue full scope treatment and keep the patient full code.  Questions and concerns were addressed.  Hard Choices booklet left for review. The family was encouraged to call with questions or concerns.    Primary Decision Maker:  NEXT OF KIN  Sons    SUMMARY OF RECOMMENDATIONS     PMT will continue to follow intermittently with you to support the family.  If Mr. Holleman becomes coherent we would ideally like to have and document [place] conversations with him directly in the presence of his sons.   The family has decided they do not want to pursue further investigation of the liver mass.  Code Status/Advance Care Planning:  Full  code   Symptom Management:   Per primary team  Additional Recommendations (Limitations, Scope, Preferences):  Full Scope Treatment  Palliative Prophylaxis:   Frequent Pain Assessment  Psycho-social/Spiritual:   Desire for further Chaplaincy support: Not discussed   Prognosis: Given recurrent cholecystitis without the ability to remove the gall bladder he will continue to have severe infections.  This in an 85 yo with advanced heart disease, kidney disease, prostate cancer and diabetes.   Discharge Planning: To Be Determined.  Currently son is hopeful for home with home health and Palliative      Primary Diagnoses: Present on Admission: . Acute cholecystitis . Sepsis ([place]) . Type II diabetes mellitus with renal manifestations ([place]) . CKD (chronic kidney disease), stage IV ([place]) . Ischemic cardiomyopathy   I have reviewed the medical record, interviewed the patient and family, and examined the patient. The following aspects are pertinent.  Past Medical History:  Diagnosis Date  . Anemia   . Anxiety   . Blood transfusion   . CAD (coronary artery disease), native coronary artery    a. 12/2010 Infpost MI/PCI: DES to the mid LCX;  b. 04/2015 MV: large fixed scar involving inf/inflat walls->no ischemia-->cath considered but deferred due to ARF/sepsis.  . Cholecystitis    a. 04/2015 s/p percutaneous drain, pending possible surgery.  . CKD (chronic kidney disease), stage III   . Complete heart block ([place])    a. 04/2015 during hospitalization/sepsis.  . GI bleeding   . Hyperlipidemia   . Ischemic cardiomyopathy    a. 04/2015 Echo: EF 40%, inf AK.  . Moderate mitral regurgitation    a. 04/2015 Echo: EF 40%, inf AK, mod MR.  . Prostate cancer ([place])    S/P radiation; "40 treatments"  . PVD (peripheral vascular disease) ([facility]) 39% bilateral carotids  . Type 2 diabetes mellitus ([place])    Social History   Socioeconomic History  . Marital status: Widowed    Spouse name: Not on  file  . Number of children: Not on file  . Years of education: Not on file  . Highest education level: Not on file  Occupational History  . Not on file  Tobacco Use  . Smoking status: Former Smoker    Types: Cigars  . Smokeless tobacco: Never Used  . Tobacco comment: "quit smoking cigars in my 20-30's"  Substance and Sexual Activity  . Alcohol use: No  . Drug  use: No  . Sexual activity: Never  Other Topics Concern  . Not on file  Social History Narrative  . Not on file   Social Determinants of Health   Financial Resource Strain:   . Difficulty of Paying Living Expenses: Not on file  Food Insecurity:   . Worried About Charity fundraiser in the Last Year: Not on file  . Ran Out of Food in the Last Year: Not on file  Transportation Needs:   . Lack of Transportation (Medical): Not on file  . Lack of Transportation (Non-Medical): Not on file  Physical Activity:   . Days of Exercise per Week: Not on file  . Minutes of Exercise per Session: Not on file  Stress:   . Feeling of Stress : Not on file  Social Connections:   . Frequency of Communication with Friends and Family: Not on file  . Frequency of Social Gatherings with Friends and Family: Not on file  . Attends Religious Services: Not on file  . Active Member of Clubs or Organizations: Not on file  . Attends Archivist Meetings: Not on file  . Marital Status: Not on file   Family History  Problem Relation Age of Onset  . Hypertension Mother   . Heart disease Father   . Heart attack Brother   . Malignant hyperthermia Neg Hx   . Stroke Neg Hx    Scheduled Meds: . clopidogrel  75 mg Oral Daily  . ferrous sulfate  325 mg Oral Q breakfast  . heparin injection (subcutaneous)  5,000 Units Subcutaneous Q8H  . insulin aspart  0-9 Units Subcutaneous TID WC  . insulin glargine  10 Units Subcutaneous q morning - 10a  . metoprolol succinate  12.5 mg Oral Daily  . senna-docusate  2 tablet Oral BID  . sodium  chloride flush  5 mL Intracatheter Q8H   Continuous Infusions: . ceFEPime (MAXIPIME) IV 2 g (09/17/19 1954)  . dextrose 5 % and 0.45% NaCl Stopped (09/16/19 2241)  . metronidazole 500 mg (09/18/19 0626)   PRN Meds:.acetaminophen **OR** acetaminophen, HYDROcodone-acetaminophen, LORazepam, oxyCODONE, polyethylene glycol No Known Allergies Review of Systems unable to provide  Physical Exam  Thin elderly male, confused, lying in bed, appears very deconditioned.  Vital Signs: BP 109/71 (BP Location: Right Arm)   Pulse 93   Temp 99 F (37.2 C) (Axillary)   Resp 18   Wt 71.7 kg   SpO2 98%   BMI 23.34 kg/m  Pain Scale: Faces POSS *See Group Information*: S-Acceptable,Sleep, easy to arouse Pain Score: Asleep   SpO2: SpO2: 98 % O2 Device:SpO2: 98 % O2 Flow Rate: .O2 Flow Rate (L/min): 2 L/min  IO: Intake/output summary:   Intake/Output Summary (Last 24 hours) at 09/18/2019 1056 Last data filed at 09/18/2019 0900 Gross per 24 hour  Intake 265 ml  Output 725 ml  Net -460 ml    LBM: Last BM Date: 09/15/19 Baseline Weight: Weight: 71.7 kg Most recent weight: Weight: 71.7 kg     Palliative Assessment/Data:  20%     Time In: 11:00  Out: 12:10 Time Total: 70 min. Visit consisted of counseling and education dealing with the complex and emotionally intense issues surrounding the need for palliative care and symptom management in the setting of serious and potentially life-threatening illness. Greater than 50%  of this time was spent counseling and coordinating care related to the above assessment and plan.  Signed by: Florentina Jenny, PA-C Palliative Medicine Pager: [phone]  Please contact Palliative Medicine Team phone at [phone] for questions and concerns.  For individual provider: See Shea Evans

## 2019-09-18 NOTE — Progress Notes (Signed)
[facility]  PROGRESS NOTE  Robert [name] SHF:[id number] DOB: [birth year] DOA: 09/01/2019 PCP: [name], MD Admit date - 08/27/2019   Admitting Physician Shela Leff, MD  Outpatient Primary MD for the patient is [name], MD  LOS - 5 Brief Narrative   Robert [name] is a 85 y.o. year old male with medical history significant for coronary artery disease status post PCI on Plavix, ischemic cardiomyopathy with EF 35 to 40%, paroxysmal A. fib not on no oral anticoagulation, mitral regurgitation, hyperlipidemia, cholecystitis in 2016 status post percutaneous drain which was removed in April 2019, CKD stage IV, prostate cancer status post radiation, PVD, type 2 diabetes, recent hospitalization from 12/11-12/12 for evaluation of atypical chest pain presumed to be noncardiac based on work-up at the [facility] presented on 09/23/2019 with confusion and was found to have fever, white count, lactic acidosis, CT consistent with right hepatic lobe mass concerning for malignancy, mild gallbladder wall thickening.  Patient was admitted with a diagnosis of acute cholecystitis.  Empirically started on IV cefepime, Flagyl. Hospital course complicated by gallbladder contracted therefore not amenable for percutaneous access.  Patient underwent IR guided cholecystostomy on 12/23.    Subjective  Eating breakfast with help from nursing tech. No complaints. Able to state his name and say ok appropriately  A & P   Recurrent acute calculus cholecystitis, status post cholecystostomy placement 12/23 HIDA scan with obstructed cystic duct, patient is a very poor surgical candidate Remains afebrile, leukocytosis is still downtrending -Continue to monitor output from cholecystostomy tube -Monitor intraoperative cultures from 12/23 -Continue empiric cefepime and Flagyl -Continue drain irrigation, will need cholangiogram/drain exchanges in 6 weeks per IR  Strep angionos in 1 of 4 blood  cultures, concern for bacteremia,  -Continue empiric cefepime -IV Flagyl given anaerobic coverage -Encouraged patient is afebrile, leukocytosis downtrending -Monitor blood culture sensitivities  Right hepatic lobe mass, concerning for malignancy MRI liver without contrast shows hyperintense lesion in the central liver, unable to further characterize given lack of IV contrast -Tissue sampling may be warranted, discussed with son ok with monitoring for now  Combined systolic and diastolic CHF, euvolemic on exam -Holding home Lasix -Monitor daily weights, volume status  Paroxysmal atrial fibrillation, rate controlled Followed by cardiology has declined anticoagulation in the past -Resume home Plavix -Continue metoprolol at reduced dose of 12.5 mg, given relative hypotension  Acute metabolic encephalopathy, improving Likely related to infection above, hypernatremia likely also contributing. CT head non acute, does show atrophy and chronic ischemic vessel disease as well as remote R cerebellar infarct, likely some dementia at baseline. Alert and only oriented to self currently at baseline report is alert and oriented x4--and confirmed on recent documentation from recent hospitalization from 12 11-12/12. -Continue close monitor mental status -Avoid sedatives -Correct electrolyte abnormalities delirium precautions --tele sitter  Hypernatremia, related to diminished p.o. intake in setting of n.p.o. status,improving Currently 147. Tolerating diet --dc D5 1/2 NS IVF given worsening hyperglycemia and reassess in am -  CKD stage IV, stable Baseline creatinine around 2.1 -avoid nephrotoxins, monitor urine output, daily BMP  Macrocytic anemia, chronic hemoglobin stable at baseline 9-10  Type 2 diabetes A1c 6.5 (07/2019) Home regimen includes: Lantus 16 units daily, lispro 9 units 3 times daily FBG/CBGs in 300s, likely related to D5 drip for hypernatremia while patient was n.p.o. -Sliding  scale now that he is eating and scheduled Lantus at 10 units with close monitoring of CBGs and discontinue D5 drip and closely monitor  Hyperlipidemia LFTs wnl  Resume home statin  Goals of care Based off recent documentation it seems patient was well functioning prior to this hospital stay Only alert to self currently, this could be attributed to active medical condition in setting of chronic medical conditions including CAD, ischemic c cardiomyopathy, A. fib, moderate/severe MR, stage IV CKD Hopeful that this will improve -Consulted palliative care to assist with goals of care discussion -I will also talk with him myself to further elicit  Family Communication  : updated son Robert [name] at [phone] on 12/24.   Code Status : Full  Disposition Plan  : IV cefepime, Flagyl, close monitoring of abdominal exam with percutaneous drain in place, monitoring blood cultures  Consults  : General surgery, IR, palliaitive  Procedures  :  12/20 right upper quadrant ultrasound-suggestive of acute cholecystitis, diffuse gallbladder wall thickening,  12/21 HIDA scan nonfilling of the gallbladder despite delayed imaging, concerning for cystic duct obstruction, common bile duct patent 12/23 percutaneous cholecystostomy tube placed by IR  DVT Prophylaxis  : Heparin  Lab Results  Component Value Date   PLT 350 09/18/2019    Diet :  Diet Order            DIET SOFT Room service appropriate? Yes with Assist; Fluid consistency: Thin  Diet effective now               Inpatient Medications Scheduled Meds: . clopidogrel  75 mg Oral Daily  . ferrous sulfate  325 mg Oral Q breakfast  . heparin injection (subcutaneous)  5,000 Units Subcutaneous Q8H  . insulin aspart  0-9 Units Subcutaneous TID WC  . insulin glargine  10 Units Subcutaneous q morning - 10a  . metoprolol succinate  12.5 mg Oral Daily  . senna-docusate  2 tablet Oral BID  . sodium chloride flush  5 mL Intracatheter Q8H    Continuous Infusions: . ceFEPime (MAXIPIME) IV 2 g (09/17/19 1954)  . dextrose 5 % and 0.45% NaCl Stopped (09/16/19 2241)  . metronidazole 500 mg (09/18/19 1315)   PRN Meds:.acetaminophen **OR** acetaminophen, HYDROcodone-acetaminophen, LORazepam, oxyCODONE, polyethylene glycol  Antibiotics  :   Anti-infectives (From admission, onward)   Start     Dose/Rate Route Frequency Ordered Stop   09/15/19 2030  vancomycin (VANCOCIN) IVPB 1000 mg/200 mL premix  Status:  Discontinued     1,000 mg 200 mL/hr over 60 Minutes Intravenous Every 48 hours 09/18/2019 1923 09/14/19 0315   09/14/19 1930  ceFEPIme (MAXIPIME) 2 g in sodium chloride 0.9 % 100 mL IVPB     2 g 200 mL/hr over 30 Minutes Intravenous Every 24 hours 09/20/2019 1921     09/14/19 0600  metroNIDAZOLE (FLAGYL) IVPB 500 mg     500 mg 100 mL/hr over 60 Minutes Intravenous Every 8 hours 09/14/19 0315     09/21/2019 1930  vancomycin (VANCOREADY) IVPB 1250 mg/250 mL     1,250 mg 166.7 mL/hr over 90 Minutes Intravenous  Once 08/26/2019 1921 09/03/2019 2304   08/29/2019 1900  ceFEPIme (MAXIPIME) 2 g in sodium chloride 0.9 % 100 mL IVPB     2 g 200 mL/hr over 30 Minutes Intravenous  Once 09/21/2019 1852 08/30/2019 2024   08/26/2019 1900  metroNIDAZOLE (FLAGYL) IVPB 500 mg     500 mg 100 mL/hr over 60 Minutes Intravenous  Once 09/14/2019 1852 09/21/2019 2041   08/27/2019 1900  vancomycin (VANCOCIN) IVPB 1000 mg/200 mL premix  Status:  Discontinued     1,000 mg 200 mL/hr over 60 Minutes Intravenous  Once 09/12/2019 1852 09/07/2019 1921       Objective   Vitals:   09/17/19 1430 09/17/19 2055 09/18/19 0148 09/18/19 0621  BP: 104/74 116/76  109/71  Pulse: 78 95  93  Resp: 16 18  18   Temp: (!) 97.5 F (36.4 C) 98.6 F (37 C)  99 F (37.2 C)  TempSrc: Oral Oral  Axillary  SpO2: 100% 95% 98% 98%  Weight:        SpO2: 98 % O2 Flow Rate (L/min): 2 L/min  Wt Readings from Last 3 Encounters:  09/17/19 71.7 kg  09/05/19 67 kg  08/03/19 70.7 kg      Intake/Output Summary (Last 24 hours) at 09/18/2019 1451 Last data filed at 09/18/2019 1358 Gross per 24 hour  Intake 405 ml  Output 600 ml  Net -195 ml    Physical Exam:  Awake Alert, oriented to self only, able to say ok appropriately to my questions and follow commands No new F.N deficits,  Symmetrical Chest wall movement, Good air movement bilaterally, CTAB Cholecystostomy tube in place draining bilious fluid, no abdominal tenderness with palpation, diminished bowel sounds RRR,No Gallops,Rubs or new Murmurs,    I have personally reviewed the following:   Data Reviewed:  CBC Recent Labs  Lab 09/14/19 0333 09/15/19 0156 09/16/19 0228 09/17/19 0403 09/18/19 0128  WBC 28.3* 23.4* 17.3* 16.2* 15.9*  HGB 9.0* 10.5* 9.2* 9.5* 9.9*  HCT 26.5* 30.8* 26.5* 28.8* 30.0*  PLT 378 379 349 358 350  MCV 103.1* 102.7* 101.1* 104.7* 104.5*  MCH 35.0* 35.0* 35.1* 34.5* 34.5*  MCHC 34.0 34.1 34.7 33.0 33.0  RDW 23.1* 22.6* 22.4* 23.4* 23.0*    Chemistries  Recent Labs  Lab 08/30/2019 1543 09/14/19 0333 09/15/19 0156 09/16/19 0228 09/17/19 0403 09/18/19 0128  NA 143 147* 147* 147* 150* 147*  K 4.6 3.8 4.2 3.7 4.0 4.6  CL 111 109 108 115* 116* 116*  CO2 20* 23 24 23  19* 21*  GLUCOSE 98 127* 233* 201* 307* 288*  BUN 68* 60* 54* 47* 41* 39*  CREATININE 2.19* 2.30* 2.31* 2.15* 2.02* 2.35*  CALCIUM 8.6* 9.2 9.5 9.2 9.1 9.0  MG  --  2.4  --   --   --   --   AST 39 22 25  --   --   --   ALT 30 30 29   --   --   --   ALKPHOS 95 95 96  --   --   --   BILITOT 1.4* 1.3* 1.2  --   --   --    ------------------------------------------------------------------------------------------------------------------ No results for input(s): CHOL, HDL, LDLCALC, TRIG, CHOLHDL, LDLDIRECT in the last 72 hours.  Lab Results  Component Value Date   HGBA1C 6.5 (A) 08/03/2019    ------------------------------------------------------------------------------------------------------------------ No results for input(s): TSH, T4TOTAL, T3FREE, THYROIDAB in the last 72 hours.  Invalid input(s): FREET3 ------------------------------------------------------------------------------------------------------------------ No results for input(s): VITAMINB12, FOLATE, FERRITIN, TIBC, IRON, RETICCTPCT in the last 72 hours.  Coagulation profile Recent Labs  Lab 09/12/2019 1916  INR 1.1    No results for input(s): DDIMER in the last 72 hours.  Cardiac Enzymes No results for input(s): CKMB, TROPONINI, MYOGLOBIN in the last 168 hours.  Invalid input(s): CK ------------------------------------------------------------------------------------------------------------------    Component Value Date/Time   BNP 874.0 (H) 09/14/2019 4627    Micro Results Recent Results (from the past 240 hour(s))  Blood culture (routine x 2)     Status: Abnormal (Preliminary result)   Collection Time:  08/25/2019  4:32 PM   Specimen: BLOOD RIGHT ARM  Result Value Ref Range Status   Specimen Description BLOOD RIGHT ARM  Final   Special Requests   Final    BOTTLES DRAWN AEROBIC AND ANAEROBIC Blood Culture results may not be optimal due to an inadequate volume of blood received in culture bottles   Culture  Setup Time   Final    GRAM POSITIVE COCCI IN PAIRS AND CHAINS ANAEROBIC BOTTLE ONLY CRITICAL RESULT CALLED TO, READ BACK BY AND VERIFIED WITH: E. SINCLAIR, PHARMD AT 7035 ON 09/17/19 BY C. JESSUP, MT.    Culture (A)  Final    STREPTOCOCCUS ANGINOSIS SUSCEPTIBILITIES TO FOLLOW Performed at [facility], [place] [address] [place], [place] [address]    Report Status PENDING  Incomplete  Blood culture (routine x 2)     Status: None   Collection Time: 09/16/2019  7:10 PM   Specimen: BLOOD  Result Value Ref Range Status   Specimen Description BLOOD LEFT ANTECUBITAL  Final   Special Requests    Final    BOTTLES DRAWN AEROBIC AND ANAEROBIC Blood Culture adequate volume   Culture   Final    NO GROWTH 5 DAYS Performed at [facility], [place] [address], [place], [place] [address]    Report Status 09/18/2019 FINAL  Final  Respiratory Panel by RT PCR (Flu A&B, Covid) - Nasopharyngeal Swab     Status: None   Collection Time: 08/26/2019  8:10 PM   Specimen: Nasopharyngeal Swab  Result Value Ref Range Status   SARS Coronavirus 2 by RT PCR NEGATIVE NEGATIVE Final    Comment: (NOTE) SARS-CoV-2 target nucleic acids are NOT DETECTED. The SARS-CoV-2 RNA is generally detectable in upper respiratoy specimens during the acute phase of infection. The lowest concentration of SARS-CoV-2 viral copies this assay can detect is 131 copies/mL. A negative result does not preclude SARS-Cov-2 infection and should not be used as the sole basis for treatment or other patient management decisions. A negative result may occur with  improper specimen collection/handling, submission of specimen other than nasopharyngeal swab, presence of viral mutation(s) within the areas targeted by this assay, and inadequate number of viral copies (<131 copies/mL). A negative result must be combined with clinical observations, patient history, and epidemiological information. The expected result is Negative. Fact Sheet for Patients:  PinkCheek.be Fact Sheet for Healthcare Providers:  GravelBags.it This test is not yet ap proved or cleared by the [place] FDA and  has been authorized for detection and/or diagnosis of SARS-CoV-2 by FDA under an Emergency Use Authorization (EUA). This EUA will remain  in effect (meaning this test can be used) for the duration of the COVID-19 declaration under Section 564(b)(1) of the Act, 21 U.S.C. section 360bbb-3(b)(1), unless the authorization is terminated or revoked sooner.    Influenza A by PCR NEGATIVE NEGATIVE Final    Influenza B by PCR NEGATIVE NEGATIVE Final    Comment: (NOTE) The Xpert Xpress SARS-CoV-2/FLU/RSV assay is intended as an aid in  the diagnosis of influenza from Nasopharyngeal swab specimens and  should not be used as a sole basis for treatment. Nasal washings and  aspirates are unacceptable for Xpert Xpress SARS-CoV-2/FLU/RSV  testing. Fact Sheet for Patients: PinkCheek.be Fact Sheet for Healthcare Providers: GravelBags.it This test is not yet approved or cleared by the [place] FDA and  has been authorized for detection and/or diagnosis of SARS-CoV-2 by  FDA under an Emergency Use Authorization (EUA). This EUA  will remain  in effect (meaning this test can be used) for the duration of the  Covid-19 declaration under Section 564(b)(1) of the Act, 21  U.S.C. section 360bbb-3(b)(1), unless the authorization is  terminated or revoked. Performed at [facility], [place] [address] [place], [place] [address]   Aerobic/Anaerobic Culture (surgical/deep wound)     Status: None (Preliminary result)   Collection Time: 09/16/19 11:44 AM   Specimen: Wound; Bile  Result Value Ref Range Status   Specimen Description WOUND  Final   Special Requests NONE  Final   Gram Stain   Final    FEW WBC PRESENT, PREDOMINANTLY PMN MODERATE GRAM POSITIVE COCCI IN PAIRS IN CHAINS FEW GRAM POSITIVE RODS FEW GRAM NEGATIVE RODS    Culture   Final    FEW KLEBSIELLA OXYTOCA CULTURE REINCUBATED FOR BETTER GROWTH MODERATE STREPTOCOCCUS ANGINOSIS NO ANAEROBES ISOLATED; CULTURE IN PROGRESS FOR 5 DAYS SUSCEPTIBILITIES TO FOLLOW Performed at [facility], [place] [address] [place],  [address]    Report Status PENDING  Incomplete    Radiology Reports CT ABDOMEN PELVIS WO CONTRAST  Result Date: 09/23/2019 CLINICAL DATA:  Abdominal pain and fever. The patient has a history of diabetes and prostate cancer. EXAM: CT ABDOMEN AND PELVIS  WITHOUT CONTRAST TECHNIQUE: Multidetector CT imaging of the abdomen and pelvis was performed following the standard protocol without IV contrast. COMPARISON:  February 28, 2017 FINDINGS: Lower chest: There is a moderate sized partially visualized right-sided pleural effusion. There is a small left-sided pleural effusion.The heart size is enlarged. There is a trace pericardial effusion. The intracardiac blood pool is hypodense relative to the adjacent myocardium consistent with anemia. Atelectasis is noted at the lung bases bilaterally. Hepatobiliary: There is I a hypoattenuating mass in the right hepatic lobe measuring approximately 5.1 x 4.1 by 6.9 cm. This is new since the prior study in 2018. There is cholelithiasis with some mild gallbladder wall thickening.There is no biliary ductal dilation. Pancreas: Normal contours without ductal dilatation. No peripancreatic fluid collection. Spleen: No splenic laceration or hematoma. Adrenals/Urinary Tract: --Adrenal glands: No adrenal hemorrhage. --Right kidney/ureter: The right kidney is atrophic. --Left kidney/ureter: The left kidney is atrophic. --Urinary bladder: Unremarkable. Stomach/Bowel: --Stomach/Duodenum: No hiatal hernia or other gastric abnormality. Normal duodenal course and caliber. --Small bowel: No dilatation or inflammation. --Colon: Rectosigmoid diverticulosis without acute inflammation. --Appendix: Normal. Vascular/Lymphatic: Atherosclerotic calcification is present within the non-aneurysmal abdominal aorta, without hemodynamically significant stenosis. --No retroperitoneal lymphadenopathy. --No mesenteric lymphadenopathy. --No pelvic or inguinal lymphadenopathy. Reproductive: Unremarkable Other: No ascites or free air. The patient is status post bilateral inguinal hernia repair. Musculoskeletal. No acute displaced fractures. IMPRESSION: 1. New hypoattenuating mass in the right hepatic lobe, concerning for malignancy. This is suboptimally evaluated on this  exam. Further evaluation with a contrast enhanced multiphase MRI or CT is recommended. 2. Bilateral pleural effusions, right greater than left. Given the above finding in the liver, consider further evaluation with a contrast enhanced CT of the chest. 3. Cardiomegaly and trace pericardial effusion. 4. Cholelithiasis with mild gallbladder wall thickening. If there is concern for acute cholecystitis, recommend further evaluation with right upper quadrant ultrasound. 5. Rectosigmoid diverticulosis without acute inflammation. Aortic Atherosclerosis (ICD10-I70.0). Electronically Signed   By: Constance Holster M.D.   On: 09/20/2019 21:23   CT Head Wo Contrast  Result Date: 09/10/2019 CLINICAL DATA:  85 year old male with altered mental status and increased weakness. EXAM: CT HEAD WITHOUT CONTRAST TECHNIQUE: Contiguous axial images were obtained from the base  of the skull through the vertex without intravenous contrast. COMPARISON:  None. FINDINGS: Brain: No evidence of acute infarction, hemorrhage, hydrocephalus, extra-axial collection or mass lesion/mass effect. Atrophy, chronic small-vessel white matter ischemic changes and remote RIGHT cerebellar infarct noted. Vascular: Carotid and vertebral atherosclerotic calcifications noted Skull: No acute abnormality. Sinuses/Orbits: No acute abnormality Other: None IMPRESSION: 1. No evidence of acute intracranial abnormality. 2. Atrophy, chronic small-vessel white matter ischemic changes and remote RIGHT cerebellar infarct. Electronically Signed   By: Margarette Canada M.D.   On: 09/07/2019 16:00   MR LIVER WO CONRTAST  Result Date: 09/15/2019 CLINICAL DATA:  Liver mass. EXAM: MRI ABDOMEN WITHOUT CONTRAST TECHNIQUE: Multiplanar multisequence MR imaging was performed without the administration of intravenous contrast. COMPARISON:  CT scan 09/10/2019. FINDINGS: Markedly limited study. Lower chest: Moderate bilateral effusions.  The heart is enlarged. Hepatobiliary: 7.6 x 5.4  x 4.5 cm heterogeneous mainly T2 hyperintense lesion identified in the medial right liver, tracking up from the region of the porta hepatis/gallbladder fossa. Relationship to the right portal vein not well demonstrated. Multiple stones noted in the gallbladder. No extrahepatic biliary duct dilatation. Pancreas: Numerous cystic lesions are noted in the pancreatic parenchyma measuring up to maximum size of approximately 1.2 cm. No dilatation of the main duct. Spleen:  No splenomegaly. No focal mass lesion. Adrenals/Urinary Tract: No adrenal nodule or mass. No hydronephrosis. Stomach/Bowel: No small bowel or colonic dilatation within the visualized abdomen. Vascular/Lymphatic: No abdominal aortic aneurysm. No abdominal lymphadenopathy. Other:  No substantial free fluid Musculoskeletal: No suspicious marrow signal abnormality. IMPRESSION: Markedly limited study due severe motion degradation, inability to administer intravenous contrast material, and patient being unable to complete the exam. Only 4 pulse sequences were obtained. 8 x 5 x 5 cm heterogeneous mainly T2 hyperintense lesion in the central liver. This cannot be further characterized given lack of intravenous contrast material and motion artifact. Tissue sampling may be warranted. Cholelithiasis. Electronically Signed   By: Misty Stanley M.D.   On: 09/15/2019 16:10   NM Hepato W/EjeCT Fract  Result Date: 09/14/2019 CLINICAL DATA:  Fever EXAM: NUCLEAR MEDICINE HEPATOBILIARY IMAGING TECHNIQUE: Sequential images of the abdomen were obtained out to 60 minutes following intravenous administration of radiopharmaceutical. RADIOPHARMACEUTICALS:  5.2 mCi Tc-68m  Choletec IV COMPARISON:  Ultrasound 09/18/2019 FINDINGS: Prompt uptake and biliary excretion of activity by the liver. Activity passes into the small bowel compatible with patent common bile duct. There is non filling of the gallbladder. Due to the patient's hypotension, morphine was not administered.  Delayed imaging to 2 hours was performed with continued non filling of the gallbladder. IMPRESSION: Non filling of the gallbladder despite delayed imaging out to 2 hours. Findings concerning for cystic duct obstruction. Common bile duct is patent. Electronically Signed   By: Rolm Baptise M.D.   On: 09/14/2019 15:58   IR Perc Cholecystostomy  Result Date: 09/16/2019 CLINICAL DATA:  Recurrent calculus cholecystitis. Patient has had 2 previous cholecystostomy catheters. EXAM: PERCUTANEOUS CHOLECYSTOSTOMY TUBE PLACEMENT WITH ULTRASOUND AND FLUOROSCOPIC GUIDANCE FLUOROSCOPY TIME:  1.2 minute; 16 uGym2 DAP TECHNIQUE: The procedure, risks (including but not limited to bleeding, infection, organ damage ), benefits, and alternatives were explained to the patient and son. Questions regarding the procedure were encouraged and answered. The son understands and consents to the procedure. Survey ultrasound of the abdomen was performed and an appropriate skin entry site was identified. Skin site was marked, prepped with chlorhexidine, and draped in usual sterile fashion, and infiltrated locally with 1% lidocaine. Intravenous Fentanyl 60mcg  and Versed 0.5mg  were administered as conscious sedation during continuous monitoring of the patient's level of consciousness and physiological / cardiorespiratory status by the radiology RN, with a total moderate sedation time of 12 minutes. Under real-time ultrasound guidance, gallbladder was accessed using a transhepatic approach with a 21-gauge needle. Ultrasound image documentation was saved. Bile returned through the hub. Needle was exchanged over a 018 guidewire for transitional dilator which allowed placement of 035 J wire. Over this, a 10.2 French pigtail catheter was advanced and formed centrally in the gallbladder lumen. 10 mL of orange opaque fluid were aspirated, sent for Gram stain and culture. Small contrast injection confirmed appropriate position. Catheter secured  externally with 0 Prolene suture and placed external drain bag. Patient tolerated the procedure well. COMPLICATIONS: COMPLICATIONS none IMPRESSION: 1. Technically successful percutaneous cholecystostomy tube placement with ultrasound and fluoroscopic guidance. Electronically Signed   By: Lucrezia Europe M.D.   On: 09/16/2019 13:53   DG Chest Port 1 View  Result Date: 09/21/2019 CLINICAL DATA:  Weakness EXAM: PORTABLE CHEST 1 VIEW COMPARISON:  09/04/2019 FINDINGS: Cardiomediastinal contours are stable accounting for portable technique and are partially obscured by graded opacity in the left and right chest. Signs of coronary stenting are noted along the left heart border. Increased interstitial markings are noted bilaterally without focal consolidation aside from obscuration of right hemidiaphragm. Spinal degenerative changes. Visualized skeletal structures otherwise unremarkable. IMPRESSION: 1. Probable new bilateral effusions and mild interstitial edema. Electronically Signed   By: Zetta Bills M.D.   On: 09/01/2019 15:33   DG Chest Port 1 View  Result Date: 09/04/2019 CLINICAL DATA:  85 year old male with chest pain and shortness of breath. EXAM: PORTABLE CHEST 1 VIEW COMPARISON:  Chest radiographs 02/28/2017 and earlier. FINDINGS: Portable AP upright view at 1437 hours. Lower lung volumes and kyphotic positioning. Mediastinal contours appear stable and within normal limits. Visualized tracheal air column is within normal limits. No definite acute pulmonary opacity. There is mild asymmetric chronic scarring in the right upper lobe. No pleural effusion or pneumothorax. No acute osseous abnormality identified. IMPRESSION: No acute cardiopulmonary abnormality identified. Electronically Signed   By: Genevie Ann M.D.   On: 09/04/2019 15:02   ECHOCARDIOGRAM COMPLETE  Result Date: 09/05/2019   ECHOCARDIOGRAM REPORT   Patient Name:   Robert [name] Date of Exam: 09/05/2019 Medical Rec #:  [id number]    Height:        69.0 in Accession #:    [id number]   Weight:       147.6 lb Date of Birth:  1928/06/23   BSA:          1.82 m Patient Age:    47 years     BP:           149/63 mmHg Patient Gender: M            HR:           94 bpm. Exam Location:  Inpatient Procedure: 2D Echo, Cardiac Doppler and Color Doppler Indications:    R07.9* Chest pain, unspecified  History:        Patient has prior history of Echocardiogram examinations, most                 recent 03/02/2017. Cardiomyopathy, CAD; Risk Factors:Hypertension                 and Diabetes. CKD. Heart Block. Cancer.  Sonographer:    Jonelle Sidle Dance Referring Phys: [place]  1.  Left ventricular ejection fraction, by visual estimation, is 35 to 40%. The left ventricle has mildly decreased function. There is mildly increased left ventricular hypertrophy.  2. Elevated left atrial and left ventricular end-diastolic pressures.  3. Left ventricular diastolic parameters are consistent with Grade II diastolic dysfunction (pseudonormalization).  4. The left ventricle demonstrates global hypokinesis.  5. Global right ventricle has low normal systolic function.The right ventricular size is normal. No increase in right ventricular wall thickness.  6. Left atrial size was moderately dilated.  7. Right atrial size was normal.  8. The mitral valve is abnormal. Mild mitral valve regurgitation.  9. The tricuspid valve is grossly normal. Tricuspid valve regurgitation is mild. 10. The aortic valve is tricuspid. Aortic valve regurgitation is trivial. Mild aortic valve sclerosis without stenosis. 11. The pulmonic valve was grossly normal. Pulmonic valve regurgitation is trivial. 12. Moderately elevated pulmonary artery systolic pressure. 13. The inferior vena cava is normal in size with <50% respiratory variability, suggesting right atrial pressure of 8 mmHg. 14. The interatrial septum appears to be lipomatous. FINDINGS  Left Ventricle: Left ventricular ejection fraction, by  visual estimation, is 35 to 40%. The left ventricle has mildly decreased function. The left ventricle demonstrates global hypokinesis. There is mildly increased left ventricular hypertrophy. Left ventricular diastolic parameters are consistent with Grade II diastolic dysfunction (pseudonormalization). Elevated left atrial and left ventricular end-diastolic pressures. Right Ventricle: The right ventricular size is normal. No increase in right ventricular wall thickness. Global RV systolic function is has low normal systolic function. The tricuspid regurgitant velocity is 3.08 m/s, and with an assumed right atrial pressure of 8 mmHg, the estimated right ventricular systolic pressure is moderately elevated at 45.9 mmHg. Left Atrium: Left atrial size was moderately dilated. Right Atrium: Right atrial size was normal in size Pericardium: There is no evidence of pericardial effusion. Mitral Valve: The mitral valve is abnormal. There is mild thickening of the mitral valve leaflet(s). Mild mitral valve regurgitation. Tricuspid Valve: The tricuspid valve is grossly normal. Tricuspid valve regurgitation is mild. Aortic Valve: The aortic valve is tricuspid. Aortic valve regurgitation is trivial. Aortic regurgitation PHT measures 338 msec. Mild aortic valve sclerosis is present, with no evidence of aortic valve stenosis. Pulmonic Valve: The pulmonic valve was grossly normal. Pulmonic valve regurgitation is trivial. Pulmonic regurgitation is trivial. Aorta: The aortic root and ascending aorta are structurally normal, with no evidence of dilitation. Venous: The inferior vena cava is normal in size with less than 50% respiratory variability, suggesting right atrial pressure of 8 mmHg. IAS/Shunts: Increased thickness of the atrial septum sparing the fossa ovalis consistent with The interatrial septum appears to be lipomatous. No atrial level shunt detected by color flow Doppler.  LEFT VENTRICLE PLAX 2D LVIDd:         5.56 cm   Diastology LVIDs:         4.29 cm  LV e' lateral:   9.68 cm/s LV PW:         1.13 cm  LV E/e' lateral: 11.0 LV IVS:        0.77 cm  LV e' medial:    7.94 cm/s LVOT diam:     2.00 cm  LV E/e' medial:  13.4 LV SV:         69 ml LV SV Index:   37.99 LVOT Area:     3.14 cm  RIGHT VENTRICLE             IVC RV Basal diam:  2.72  cm     IVC diam: 1.81 cm RV S prime:     10.60 cm/s TAPSE (M-mode): 1.1 cm LEFT ATRIUM             Index       RIGHT ATRIUM           Index LA diam:        4.60 cm 2.53 cm/m  RA Area:     20.50 cm LA Vol (A2C):   85.7 ml 47.20 ml/m RA Volume:   60.10 ml  33.10 ml/m LA Vol (A4C):   68.4 ml 37.67 ml/m LA Biplane Vol: 76.5 ml 42.13 ml/m  AORTIC VALVE LVOT Vmax:   74.60 cm/s LVOT Vmean:  46.450 cm/s LVOT VTI:    0.114 m AI PHT:      338 msec  AORTA Ao Root diam: 3.90 cm Ao Asc diam:  3.60 cm MITRAL VALVE                         TRICUSPID VALVE MV Area (PHT): 4.49 cm              TR Peak grad:   37.9 mmHg MV PHT:        49.01 msec            TR Vmax:        330.00 cm/s MV Decel Time: 169 msec MV E velocity: 106.00 cm/s 103 cm/s  SHUNTS MV A velocity: 56.30 cm/s  70.3 cm/s Systemic VTI:  0.11 m MV E/A ratio:  1.88        1.5       Systemic Diam: 2.00 cm  Lyman Bishop MD Electronically signed by Lyman Bishop MD Signature Date/Time: 09/05/2019/2:30:01 PM    Final    US Abdomen Limited RUQ  Result Date: 09/01/2019 CLINICAL DATA:  Right upper quadrant pain EXAM: ULTRASOUND ABDOMEN LIMITED RIGHT UPPER QUADRANT COMPARISON:  CT same day FINDINGS: Gallbladder: There are multiple shadowing gallstones filling the gallbladder the largest measuring 1.8 cm. There is a diffusely thickened gallbladder wall measuring 6.3 mm. No sonographic Percell Miller sign is seen. Common bile duct: Diameter: 2 mm. Liver: There is a heterogeneous appearance to the liver parenchyma. The hypodense mass seen on CT same day is not well visualized on this exam. Portal vein is patent on color Doppler imaging with normal direction of  blood flow towards the liver. Other: None. IMPRESSION: Cholelithiasis with diffuse gallbladder wall thickening which could be suggestive of acute cholecystitis. Heterogeneous liver parenchyma. The ill-defined mass on recent CT is not well visualized on this examination. Would recommend CT with contrast or MRI for further evaluation. Electronically Signed   By: Prudencio Pair M.D.   On: 08/31/2019 22:58     Time Spent in minutes  30     Desiree Hane M.D on 09/18/2019 at 2:51 PM  To page go to [email or web address] - password [facility]

## 2019-09-18 NOTE — Plan of Care (Signed)
  Problem: Education: Goal: Knowledge of General Education information will improve Description: Including pain rating scale, medication(s)/side effects and non-pharmacologic comfort measures Outcome: Not Progressing   Problem: Health Behavior/Discharge Planning: Goal: Ability to manage health-related needs will improve Outcome: Not Progressing   Problem: Clinical Measurements: Goal: Will remain free from infection Outcome: Not Progressing

## 2019-09-19 DIAGNOSIS — Z515 Encounter for palliative care: Secondary | ICD-10-CM

## 2019-09-19 DIAGNOSIS — R4182 Altered mental status, unspecified: Secondary | ICD-10-CM

## 2019-09-19 LAB — COMPREHENSIVE METABOLIC PANEL
ALT: 25 U/L (ref 0–44)
AST: 38 U/L (ref 15–41)
Albumin: 2.4 g/dL — ABNORMAL LOW (ref 3.5–5.0)
Alkaline Phosphatase: 70 U/L (ref 38–126)
Anion gap: 10 (ref 5–15)
BUN: 35 mg/dL — ABNORMAL HIGH (ref 8–23)
CO2: 20 mmol/L — ABNORMAL LOW (ref 22–32)
Calcium: 8.8 mg/dL — ABNORMAL LOW (ref 8.9–10.3)
Chloride: 117 mmol/L — ABNORMAL HIGH (ref 98–111)
Creatinine, Ser: 2.34 mg/dL — ABNORMAL HIGH (ref 0.61–1.24)
GFR calc Af Amer: 28 mL/min — ABNORMAL LOW (ref >60)
GFR calc non Af Amer: 24 mL/min — ABNORMAL LOW (ref >60)
Glucose, Bld: 189 mg/dL — ABNORMAL HIGH (ref 70–99)
Potassium: 3.9 mmol/L (ref 3.5–5.1)
Sodium: 147 mmol/L — ABNORMAL HIGH (ref 135–145)
Total Bilirubin: 1.2 mg/dL (ref 0.3–1.2)
Total Protein: 6 g/dL — ABNORMAL LOW (ref 6.5–8.1)

## 2019-09-19 LAB — CBC
HCT: 29.2 % — ABNORMAL LOW (ref 39.0–52.0)
Hemoglobin: 9.7 g/dL — ABNORMAL LOW (ref 13.0–17.0)
MCH: 34.8 pg — ABNORMAL HIGH (ref 26.0–34.0)
MCHC: 33.2 g/dL (ref 30.0–36.0)
MCV: 104.7 fL — ABNORMAL HIGH (ref 80.0–100.0)
Platelets: 305 10*3/uL (ref 150–400)
RBC: 2.79 MIL/uL — ABNORMAL LOW (ref 4.22–5.81)
RDW: 23.6 % — ABNORMAL HIGH (ref 11.5–15.5)
WBC: 26.8 10*3/uL — ABNORMAL HIGH (ref 4.0–10.5)
nRBC: 0.8 % — ABNORMAL HIGH (ref 0.0–0.2)

## 2019-09-19 LAB — CULTURE, BLOOD (ROUTINE X 2)

## 2019-09-19 LAB — GLUCOSE, CAPILLARY
Glucose-Capillary: 190 mg/dL — ABNORMAL HIGH (ref 70–99)
Glucose-Capillary: 142 mg/dL — ABNORMAL HIGH (ref 70–99)
Glucose-Capillary: 151 mg/dL — ABNORMAL HIGH (ref 70–99)
Glucose-Capillary: 129 mg/dL — ABNORMAL HIGH (ref 70–99)

## 2019-09-19 MED ORDER — SODIUM CHLORIDE 0.9 % IV SOLN
2.0000 g | INTRAVENOUS | Status: DC
  Administered 2019-09-19 – 2019-09-20 (×2): 2 g via INTRAVENOUS
  Filled 2019-09-19 (×2): qty 2

## 2019-09-19 MED ORDER — SODIUM CHLORIDE 0.9 % IV SOLN: INTRAVENOUS | Status: AC

## 2019-09-19 NOTE — Evaluation (Signed)
Physical Therapy Evaluation Patient Details Name: Robert [name] MRN: [id number] DOB: [birth year] Today's Date: 09/19/2019   History of Present Illness  Pt is an 85 y/o male with medical history significant for coronary artery disease status post PCI on Plavix, ischemic cardiomyopathy with EF 35 to 40%, paroxysmal A. fib not on no oral anticoagulation, mitral regurgitation, hyperlipidemia, cholecystitis in 2016 status post percutaneous drain which was removed in April 2019, CKD stage IV, prostate cancer status post radiation, PVD, type 2 diabetes, recent hospitalization from 12/11-12/12 for evaluation of atypical chest pain presumed to be noncardiac based on work-up at the [place] presented on 09/11/2019 with confusion and was found to have fever, white count, lactic acidosis, CT consistent with right hepatic lobe mass concerning for malignancy, mild gallbladder wall thickening.  Patient was admitted with a diagnosis of acute cholecystitis. Hospital course complicated by gallbladder contracted therefore not amenable for percutaneous access.  Patient underwent IR guided cholecystostomy on 12/23.    Clinical Impression  Pt presented supine in bed with HOB elevated, awake and willing to participate in therapy session. Prior to admission, pt reported that he was independent with all functional mobility, ADLs and lived alone. No family or caregivers present during evaluation to confirm this information. At the time of evaluation, pt limited secondary to fatigue and weakness. He required min A for bed mobility and min A x2 for transfers with RW. Pt very focused on taking a nap and therefore not amenable to further mobility. Pt would continue to benefit from skilled physical therapy services at this time while admitted and after d/c to address the below listed limitations in order to improve overall safety and independence with functional mobility.     Follow Up Recommendations SNF    Equipment  Recommendations  None recommended by PT    Recommendations for Other Services       Precautions / Restrictions Precautions Precautions: Fall Precaution Comments: R lateral abdominal drain Restrictions Weight Bearing Restrictions: No      Mobility  Bed Mobility Overal bed mobility: Needs Assistance Bed Mobility: Supine to Sit     Supine to sit: Min assist     General bed mobility comments: increased time and effort, HOB elevated min A to get R LE off of bed and for trunk elevation  Transfers Overall transfer level: Needs assistance Equipment used: Rolling walker (2 wheeled) Transfers: Sit to/from [facility] Sit to Stand: Min assist;+2 safety/equipment Stand pivot transfers: Min assist;+2 safety/equipment;+2 physical assistance       General transfer comment: min A for safety and stability with transitional movement, pivoted to recliner chair towards his L side  Ambulation/Gait                Stairs            Wheelchair Mobility    Modified Rankin (Stroke Patients Only)       Balance Overall balance assessment: Needs assistance Sitting-balance support: Feet supported Sitting balance-Leahy Scale: Fair     Standing balance support: Bilateral upper extremity supported;Single extremity supported Standing balance-Leahy Scale: Poor                               Pertinent Vitals/Pain Pain Assessment: No/denies pain    Home Living Family/patient expects to be discharged to:: Unsure                 Additional Comments: Unsure - pt reporting that  he lives alone. pt very HOH and difficult to understand at time; unable to get reliable info with no family/caregivers present    Prior Function Level of Independence: Independent         Comments: Unsure - pt reporting he was independent with all mobility and ADLs; however, no family/caregivers present to confirm     Hand Dominance        Extremity/Trunk  Assessment   Upper Extremity Assessment Upper Extremity Assessment: Generalized weakness    Lower Extremity Assessment Lower Extremity Assessment: Generalized weakness    Cervical / Trunk Assessment Cervical / Trunk Assessment: Kyphotic  Communication   Communication: HOH  Cognition Arousal/Alertness: Awake/alert Behavior During Therapy: Flat affect Overall Cognitive Status: Impaired/Different from baseline Area of Impairment: Following commands;Safety/judgement;Problem solving                       Following Commands: Follows one step commands with increased time Safety/Judgement: Decreased awareness of deficits;Decreased awareness of safety   Problem Solving: Slow processing;Decreased initiation;Difficulty sequencing;Requires verbal cues        General Comments      Exercises     Assessment/Plan    PT Assessment Patient needs continued PT services  PT Problem List Decreased strength;Decreased activity tolerance;Decreased balance;Decreased mobility;Decreased coordination;Decreased cognition;Decreased knowledge of use of DME;Decreased safety awareness;Decreased knowledge of precautions       PT Treatment Interventions DME instruction;Gait training;Stair training;Functional mobility training;Therapeutic activities;Therapeutic exercise;Balance training;Neuromuscular re-education;Patient/family education;Cognitive remediation    PT Goals (Current goals can be found in the Care Plan section)  Acute Rehab PT Goals Patient Stated Goal: to take a nap PT Goal Formulation: With patient Time For Goal Achievement: 10/03/19 Potential to Achieve Goals: Fair    Frequency Min 2X/week   Barriers to discharge Decreased caregiver support      Co-evaluation               AM-PAC PT "6 Clicks" Mobility  Outcome Measure Help needed turning from your back to your side while in a flat bed without using bedrails?: A Little Help needed moving from lying on your back to  sitting on the side of a flat bed without using bedrails?: A Little Help needed moving to and from a bed to a chair (including a wheelchair)?: A Lot Help needed standing up from a chair using your arms (e.g., wheelchair or bedside chair)?: A Little Help needed to walk in hospital room?: A Lot Help needed climbing 3-5 steps with a railing? : Total 6 Click Score: 14    End of Session Equipment Utilized During Treatment: Gait belt Activity Tolerance: Patient limited by fatigue Patient left: in chair;with call bell/phone within reach;with chair alarm set;Other (comment)(telesitter present) Nurse Communication: Mobility status PT Visit Diagnosis: Other abnormalities of gait and mobility (R26.89);Muscle weakness (generalized) (M62.81)    Time: 1517-6160 PT Time Calculation (min) (ACUTE ONLY): 32 min   Charges:   PT Evaluation $PT Eval Moderate Complexity: 1 Mod PT Treatments $Therapeutic Activity: 8-22 mins        Anastasio Champion, DPT  Acute Rehabilitation Services Pager [phone] Office [place] 09/19/2019, 5:11 PM

## 2019-09-19 NOTE — Progress Notes (Signed)
Met with patient and son at bedside.  Patient is still difficult to understand but is more alert and coherent today.  He answers questions appropriately and is very thankful for the care he is receiving.  Per son the patient is much more alert today.  Patient does not appear to have any recollection of what has happened in the last several days, but is orientated today.  Son [name]) told me he considered Palliative but does not think he will need it just yet.  He would like to get his father home first and then decide what is needed.   [name] states he read the 1st chapter in "Hard Choices" last night and it was very dark.  [name] is somewhat resistant to any suggestion that his father is beginning to fail.  Mr. Vavra other son Octavia Bruckner is coming tomorrow at noon.  Octavia Bruckner is a PT.  Hopefully the opportunity will arise to at least discuss code status with the patient in the presence of his son tomorrow.  Florentina Jenny, PA-C Palliative Medicine Office:  [phone]  15 min.

## 2019-09-19 NOTE — Progress Notes (Signed)
[facility]  PROGRESS NOTE  Robert [name] ZJI:[id number] DOB: 1927/11/16 DOA: 09/01/2019 PCP: Robert Fess, MD Admit date - 09/04/2019   Admitting Physician Shela Leff, MD  Outpatient Primary MD for the patient is Robert Fess, MD  LOS - 6 Brief Narrative   Robert [name] is a 85 y.o. year old male with medical history significant for coronary artery disease status post PCI on Plavix, ischemic cardiomyopathy with EF 35 to 40%, paroxysmal A. fib not on no oral anticoagulation, mitral regurgitation, hyperlipidemia, cholecystitis in 2016 status post percutaneous drain which was removed in April 2019, CKD stage IV, prostate cancer status post radiation, PVD, type 2 diabetes, recent hospitalization from 12/11-12/12 for evaluation of atypical chest pain presumed to be noncardiac based on work-up at the [facility] presented on 09/18/2019 with confusion and was found to have fever, white count, lactic acidosis, CT consistent with right hepatic lobe mass concerning for malignancy, mild gallbladder wall thickening.  Patient was admitted with a diagnosis of acute cholecystitis.  Empirically started on IV cefepime, Flagyl. Hospital course complicated by gallbladder contracted initially not amenable for percutaneous access. Was evaluated by surgery but deemed not a surgical candidate Patient underwent IR guided cholecystostomy on 12/23 and has been continued on antibiotics.  Hospital course complicated by Strep anginosis and acute metabolic encephalopathy from infection and hypernatremia    Subjective  Sitting in bed. Can tell me his full name.   A & P   Recurrent acute calculus cholecystitis, status post cholecystostomy placement 12/23 HIDA scan with obstructed cystic duct, patient is a very poor surgical candidate Remains afebrile, leukocytosis has gone back up Intraoperative culture growing Klebsiella and Strep angionosis -Continue to monitor output from cholecystostomy  tube -Monitor intraoperative cultures from 12/23 -Transition empiric cefepime and Flagyl to Ceftriaxone on 12/26 based off cultures -Continue drain irrigation, will need cholangiogram/drain exchanges in 6 weeks per IR  Strep anginosis bacteremia,  -Transition to ceftriaxone based off sensitivities -Encouraged patient is afebrile, though leukocytosis has gone back up from 15 to 26   Right hepatic lobe mass, concerning for malignancy MRI liver without contrast shows hyperintense lesion in the central liver, unable to further characterize given lack of IV contrast -Tissue sampling may be warranted, discussed with son ok with monitoring for now  Combined systolic and diastolic CHF, euvolemic on exam -Holding home Lasix given soft BP and euvolemia -Monitor daily weights, volume status  Paroxysmal atrial fibrillation, rate controlled Followed by cardiology has declined anticoagulation in the past - home Plavix -Continue metoprolol at reduced dose of 12.5 mg, given relative hypotension  Acute metabolic encephalopathy, improving Likely related to infection above, hypernatremia likely also contributing. CT head non acute, does show atrophy and chronic ischemic vessel disease as well as remote R cerebellar infarct, likely some dementia at baseline. Alert and only oriented to self this am. At baseline report is alert and oriented x4--and confirmed on recent documentation from recent hospitalization from 12/11-12/12. -Continue close monitor mental status -Avoid sedatives -Correct electrolyte abnormalities to correct -- delirium precautions --tele sitter  Hypernatremia, related to diminished p.o. intake in setting of previous n.p.o. status,improving Currently 147. Tolerating oral diet --dc'd D5 1/2 NS IVF given worsening hyperglycemia on 12/25 and now that patient tolerating diet --continue to trend on BMP to assess need for supplemental IVF  CKD stage IV, stable Baseline creatinine around  2.1 -avoid nephrotoxins, monitor urine output, daily BMP  Macrocytic anemia, chronic hemoglobin stable at baseline 9-10 -monitor CBC  Type 2 diabetes, well  controlled A1c 6.5 (07/2019) Had previous hyperglycemia while on D5 for hypernatremia Home regimen includes: Lantus 16 units daily, lispro 9 units 3 times daily FBGs ranging 120-180 -Sliding scale,  scheduled Lantus at 10,  monitoring of CBGs   Hyperlipidemia LFTs wnl - home statin  Goals of care Based off recent documentation and discussion with son patient was well functioning prior to this hospital stay Only alert to self currently, this could be attributed to infection and hypernatremia in setting of chronic medical conditions including CAD, ischemic c cardiomyopathy, A. fib, moderate/severe MR, stage IV CKD Hopeful that this will improve -Consulted palliative care to assist with goals of care discussion -I spoke with son on 12/24  Family Communication  : updated son Robert [name] at [phone] on 12/24.   Code Status : Full  Disposition Plan  : IV ceftriaxone, close monitoring of abdominal exam with percutaneous drain in place, monitoring blood cultures  Consults  : General surgery, IR, palliaitive  Procedures  :  12/20 right upper quadrant ultrasound-suggestive of acute cholecystitis, diffuse gallbladder wall thickening,  12/21 HIDA scan nonfilling of the gallbladder despite delayed imaging, concerning for cystic duct obstruction, common bile duct patent 12/23 percutaneous cholecystostomy tube placed by IR  DVT Prophylaxis  : Heparin  Lab Results  Component Value Date   PLT 305 09/19/2019    Diet :  Diet Order            DIET SOFT Room service appropriate? Yes with Assist; Fluid consistency: Thin  Diet effective now               Inpatient Medications Scheduled Meds: . clopidogrel  75 mg Oral Daily  . ferrous sulfate  325 mg Oral Q breakfast  . heparin injection (subcutaneous)  5,000 Units  Subcutaneous Q8H  . insulin aspart  0-9 Units Subcutaneous TID WC  . insulin glargine  10 Units Subcutaneous q morning - 10a  . metoprolol succinate  12.5 mg Oral Daily  . senna-docusate  2 tablet Oral BID  . sodium chloride flush  5 mL Intracatheter Q8H   Continuous Infusions: . sodium chloride 75 mL/hr at 09/19/19 0819  . ceFEPime (MAXIPIME) IV 2 g (09/18/19 2032)  . metronidazole 500 mg (09/19/19 0540)   PRN Meds:.acetaminophen **OR** acetaminophen, HYDROcodone-acetaminophen, LORazepam, oxyCODONE, polyethylene glycol  Antibiotics  :   Anti-infectives (From admission, onward)   Start     Dose/Rate Route Frequency Ordered Stop   09/15/19 2030  vancomycin (VANCOCIN) IVPB 1000 mg/200 mL premix  Status:  Discontinued     1,000 mg 200 mL/hr over 60 Minutes Intravenous Every 48 hours 08/26/2019 1923 09/14/19 0315   09/14/19 1930  ceFEPIme (MAXIPIME) 2 g in sodium chloride 0.9 % 100 mL IVPB     2 g 200 mL/hr over 30 Minutes Intravenous Every 24 hours 08/25/2019 1921     09/14/19 0600  metroNIDAZOLE (FLAGYL) IVPB 500 mg     500 mg 100 mL/hr over 60 Minutes Intravenous Every 8 hours 09/14/19 0315     09/18/2019 1930  vancomycin (VANCOREADY) IVPB 1250 mg/250 mL     1,250 mg 166.7 mL/hr over 90 Minutes Intravenous  Once 09/12/2019 1921 09/23/2019 2304   08/31/2019 1900  ceFEPIme (MAXIPIME) 2 g in sodium chloride 0.9 % 100 mL IVPB     2 g 200 mL/hr over 30 Minutes Intravenous  Once 09/08/2019 1852 08/31/2019 2024   09/21/2019 1900  metroNIDAZOLE (FLAGYL) IVPB 500 mg     500 mg 100  mL/hr over 60 Minutes Intravenous  Once 09/02/2019 1852 09/19/2019 2041   09/19/2019 1900  vancomycin (VANCOCIN) IVPB 1000 mg/200 mL premix  Status:  Discontinued     1,000 mg 200 mL/hr over 60 Minutes Intravenous  Once 09/23/2019 1852 09/12/2019 1921       Objective   Vitals:   09/18/19 0621 09/18/19 1505 09/18/19 2008 09/19/19 0512  BP: 109/71 (!) 103/59 138/72 (!) 98/54  Pulse: 93 76 68 88  Resp: 18 16 18 16   Temp: 99 F  (37.2 C) 97.6 F (36.4 C) 98.3 F (36.8 C) 97.6 F (36.4 C)  TempSrc: Axillary Oral  Oral  SpO2: 98% 99% 100% 96%  Weight:        SpO2: 96 % O2 Flow Rate (L/min): 2 L/min  Wt Readings from Last 3 Encounters:  09/17/19 71.7 kg  09/05/19 67 kg  08/03/19 70.7 kg     Intake/Output Summary (Last 24 hours) at 09/19/2019 6010 Last data filed at 09/19/2019 0934 Gross per 24 hour  Intake 440 ml  Output 400 ml  Net 40 ml    Physical Exam:  Awake Alert, oriented to self only,  Resting in bed comfortably  follows commands No new F.N deficits,  Symmetrical Chest wall movement, Good air movement bilaterally, CTAB Cholecystostomy tube in place draining bilious fluid, no abdominal tenderness with palpation, diminished bowel sounds RRR,No Gallops,Rubs or new Murmurs,    I have personally reviewed the following:   Data Reviewed:  CBC Recent Labs  Lab 09/15/19 0156 09/16/19 0228 09/17/19 0403 09/18/19 0128 09/19/19 0258  WBC 23.4* 17.3* 16.2* 15.9* 26.8*  HGB 10.5* 9.2* 9.5* 9.9* 9.7*  HCT 30.8* 26.5* 28.8* 30.0* 29.2*  PLT 379 349 358 350 305  MCV 102.7* 101.1* 104.7* 104.5* 104.7*  MCH 35.0* 35.1* 34.5* 34.5* 34.8*  MCHC 34.1 34.7 33.0 33.0 33.2  RDW 22.6* 22.4* 23.4* 23.0* 23.6*    Chemistries  Recent Labs  Lab 09/12/2019 1543 09/14/19 0333 09/15/19 0156 09/16/19 0228 09/17/19 0403 09/18/19 0128 09/19/19 0258  NA 143 147* 147* 147* 150* 147* 147*  K 4.6 3.8 4.2 3.7 4.0 4.6 3.9  CL 111 109 108 115* 116* 116* 117*  CO2 20* 23 24 23  19* 21* 20*  GLUCOSE 98 127* 233* 201* 307* 288* 189*  BUN 68* 60* 54* 47* 41* 39* 35*  CREATININE 2.19* 2.30* 2.31* 2.15* 2.02* 2.35* 2.34*  CALCIUM 8.6* 9.2 9.5 9.2 9.1 9.0 8.8*  MG  --  2.4  --   --   --   --   --   AST 39 22 25  --   --   --  38  ALT 30 30 29   --   --   --  25  ALKPHOS 95 95 96  --   --   --  70  BILITOT 1.4* 1.3* 1.2  --   --   --  1.2    ------------------------------------------------------------------------------------------------------------------ No results for input(s): CHOL, HDL, LDLCALC, TRIG, CHOLHDL, LDLDIRECT in the last 72 hours.  Lab Results  Component Value Date   HGBA1C 6.5 (A) 08/03/2019   ------------------------------------------------------------------------------------------------------------------ No results for input(s): TSH, T4TOTAL, T3FREE, THYROIDAB in the last 72 hours.  Invalid input(s): FREET3 ------------------------------------------------------------------------------------------------------------------ No results for input(s): VITAMINB12, FOLATE, FERRITIN, TIBC, IRON, RETICCTPCT in the last 72 hours.  Coagulation profile Recent Labs  Lab 08/29/2019 1916  INR 1.1    No results for input(s): DDIMER in the last 72 hours.  Cardiac Enzymes No results  for input(s): CKMB, TROPONINI, MYOGLOBIN in the last 168 hours.  Invalid input(s): CK ------------------------------------------------------------------------------------------------------------------    Component Value Date/Time   BNP 874.0 (H) 09/14/2019 9147    Micro Results Recent Results (from the past 240 hour(s))  Blood culture (routine x 2)     Status: Abnormal   Collection Time: 09/04/2019  4:32 PM   Specimen: BLOOD RIGHT ARM  Result Value Ref Range Status   Specimen Description BLOOD RIGHT ARM  Final   Special Requests   Final    BOTTLES DRAWN AEROBIC AND ANAEROBIC Blood Culture results may not be optimal due to an inadequate volume of blood received in culture bottles   Culture  Setup Time   Final    GRAM POSITIVE COCCI IN PAIRS AND CHAINS ANAEROBIC BOTTLE ONLY CRITICAL RESULT CALLED TO, READ BACK BY AND VERIFIED WITH: E. SINCLAIR, PHARMD AT 1335 ON 09/17/19 BY C. JESSUP, MT. Performed at [facility], [place] [address], [place], [place] [address]    Culture STREPTOCOCCUS ANGINOSIS (A)  Final   Report Status  09/19/2019 FINAL  Final   Organism ID, Bacteria STREPTOCOCCUS ANGINOSIS  Final      Susceptibility   Streptococcus anginosis - MIC*    PENICILLIN 0.12 SENSITIVE Sensitive     CEFTRIAXONE 1 SENSITIVE Sensitive     ERYTHROMYCIN <=0.12 SENSITIVE Sensitive     LEVOFLOXACIN 0.5 SENSITIVE Sensitive     VANCOMYCIN 1 SENSITIVE Sensitive     * STREPTOCOCCUS ANGINOSIS  Blood culture (routine x 2)     Status: None   Collection Time: 09/16/2019  7:10 PM   Specimen: BLOOD  Result Value Ref Range Status   Specimen Description BLOOD LEFT ANTECUBITAL  Final   Special Requests   Final    BOTTLES DRAWN AEROBIC AND ANAEROBIC Blood Culture adequate volume   Culture   Final    NO GROWTH 5 DAYS Performed at [facility], [place] [address], [place], [place] [address]    Report Status 09/18/2019 FINAL  Final  Respiratory Panel by RT PCR (Flu A&B, Covid) - Nasopharyngeal Swab     Status: None   Collection Time: 09/01/2019  8:10 PM   Specimen: Nasopharyngeal Swab  Result Value Ref Range Status   SARS Coronavirus 2 by RT PCR NEGATIVE NEGATIVE Final    Comment: (NOTE) SARS-CoV-2 target nucleic acids are NOT DETECTED. The SARS-CoV-2 RNA is generally detectable in upper respiratoy specimens during the acute phase of infection. The lowest concentration of SARS-CoV-2 viral copies this assay can detect is 131 copies/mL. A negative result does not preclude SARS-Cov-2 infection and should not be used as the sole basis for treatment or other patient management decisions. A negative result may occur with  improper specimen collection/handling, submission of specimen other than nasopharyngeal swab, presence of viral mutation(s) within the areas targeted by this assay, and inadequate number of viral copies (<131 copies/mL). A negative result must be combined with clinical observations, patient history, and epidemiological information. The expected result is Negative. Fact Sheet for Patients:   PinkCheek.be Fact Sheet for Healthcare Providers:  GravelBags.it This test is not yet ap proved or cleared by the [place] FDA and  has been authorized for detection and/or diagnosis of SARS-CoV-2 by FDA under an Emergency Use Authorization (EUA). This EUA will remain  in effect (meaning this test can be used) for the duration of the COVID-19 declaration under Section 564(b)(1) of the Act, 21 U.S.C. section 360bbb-3(b)(1), unless the authorization is terminated or  revoked sooner.    Influenza A by PCR NEGATIVE NEGATIVE Final   Influenza B by PCR NEGATIVE NEGATIVE Final    Comment: (NOTE) The Xpert Xpress SARS-CoV-2/FLU/RSV assay is intended as an aid in  the diagnosis of influenza from Nasopharyngeal swab specimens and  should not be used as a sole basis for treatment. Nasal washings and  aspirates are unacceptable for Xpert Xpress SARS-CoV-2/FLU/RSV  testing. Fact Sheet for Patients: PinkCheek.be Fact Sheet for Healthcare Providers: GravelBags.it This test is not yet approved or cleared by the [place] FDA and  has been authorized for detection and/or diagnosis of SARS-CoV-2 by  FDA under an Emergency Use Authorization (EUA). This EUA will remain  in effect (meaning this test can be used) for the duration of the  Covid-19 declaration under Section 564(b)(1) of the Act, 21  U.S.C. section 360bbb-3(b)(1), unless the authorization is  terminated or revoked. Performed at [facility], [place] [address], [place], [place] [address]   Aerobic/Anaerobic Culture (surgical/deep wound)     Status: None (Preliminary result)   Collection Time: 09/16/19 11:44 AM   Specimen: Wound; Bile  Result Value Ref Range Status   Specimen Description WOUND  Final   Special Requests NONE  Final   Gram Stain   Final    FEW WBC PRESENT, PREDOMINANTLY PMN MODERATE GRAM POSITIVE  COCCI IN PAIRS IN CHAINS FEW GRAM POSITIVE RODS FEW GRAM NEGATIVE RODS    Culture   Final    FEW KLEBSIELLA OXYTOCA CULTURE REINCUBATED FOR BETTER GROWTH MODERATE STREPTOCOCCUS ANGINOSIS NO ANAEROBES ISOLATED; CULTURE IN PROGRESS FOR 5 DAYS SUSCEPTIBILITIES TO FOLLOW Performed at [facility], [place] [address], [place], [place] [address]    Report Status PENDING  Incomplete    Radiology Reports CT ABDOMEN PELVIS WO CONTRAST  Result Date: 09/17/2019 CLINICAL DATA:  Abdominal pain and fever. The patient has a history of diabetes and prostate cancer. EXAM: CT ABDOMEN AND PELVIS WITHOUT CONTRAST TECHNIQUE: Multidetector CT imaging of the abdomen and pelvis was performed following the standard protocol without IV contrast. COMPARISON:  February 28, 2017 FINDINGS: Lower chest: There is a moderate sized partially visualized right-sided pleural effusion. There is a small left-sided pleural effusion.The heart size is enlarged. There is a trace pericardial effusion. The intracardiac blood pool is hypodense relative to the adjacent myocardium consistent with anemia. Atelectasis is noted at the lung bases bilaterally. Hepatobiliary: There is I a hypoattenuating mass in the right hepatic lobe measuring approximately 5.1 x 4.1 by 6.9 cm. This is new since the prior study in 2018. There is cholelithiasis with some mild gallbladder wall thickening.There is no biliary ductal dilation. Pancreas: Normal contours without ductal dilatation. No peripancreatic fluid collection. Spleen: No splenic laceration or hematoma. Adrenals/Urinary Tract: --Adrenal glands: No adrenal hemorrhage. --Right kidney/ureter: The right kidney is atrophic. --Left kidney/ureter: The left kidney is atrophic. --Urinary bladder: Unremarkable. Stomach/Bowel: --Stomach/Duodenum: No hiatal hernia or other gastric abnormality. Normal duodenal course and caliber. --Small bowel: No dilatation or inflammation. --Colon: Rectosigmoid diverticulosis  without acute inflammation. --Appendix: Normal. Vascular/Lymphatic: Atherosclerotic calcification is present within the non-aneurysmal abdominal aorta, without hemodynamically significant stenosis. --No retroperitoneal lymphadenopathy. --No mesenteric lymphadenopathy. --No pelvic or inguinal lymphadenopathy. Reproductive: Unremarkable Other: No ascites or free air. The patient is status post bilateral inguinal hernia repair. Musculoskeletal. No acute displaced fractures. IMPRESSION: 1. New hypoattenuating mass in the right hepatic lobe, concerning for malignancy. This is suboptimally evaluated on this exam. Further evaluation with a contrast enhanced multiphase MRI  or CT is recommended. 2. Bilateral pleural effusions, right greater than left. Given the above finding in the liver, consider further evaluation with a contrast enhanced CT of the chest. 3. Cardiomegaly and trace pericardial effusion. 4. Cholelithiasis with mild gallbladder wall thickening. If there is concern for acute cholecystitis, recommend further evaluation with right upper quadrant ultrasound. 5. Rectosigmoid diverticulosis without acute inflammation. Aortic Atherosclerosis (ICD10-I70.0). Electronically Signed   By: Constance Holster M.D.   On: 09/03/2019 21:23   CT Head Wo Contrast  Result Date: 09/03/2019 CLINICAL DATA:  85 year old male with altered mental status and increased weakness. EXAM: CT HEAD WITHOUT CONTRAST TECHNIQUE: Contiguous axial images were obtained from the base of the skull through the vertex without intravenous contrast. COMPARISON:  None. FINDINGS: Brain: No evidence of acute infarction, hemorrhage, hydrocephalus, extra-axial collection or mass lesion/mass effect. Atrophy, chronic small-vessel white matter ischemic changes and remote RIGHT cerebellar infarct noted. Vascular: Carotid and vertebral atherosclerotic calcifications noted Skull: No acute abnormality. Sinuses/Orbits: No acute abnormality Other: None  IMPRESSION: 1. No evidence of acute intracranial abnormality. 2. Atrophy, chronic small-vessel white matter ischemic changes and remote RIGHT cerebellar infarct. Electronically Signed   By: Margarette Canada M.D.   On: 09/08/2019 16:00   MR LIVER WO CONRTAST  Result Date: 09/15/2019 CLINICAL DATA:  Liver mass. EXAM: MRI ABDOMEN WITHOUT CONTRAST TECHNIQUE: Multiplanar multisequence MR imaging was performed without the administration of intravenous contrast. COMPARISON:  CT scan 09/16/2019. FINDINGS: Markedly limited study. Lower chest: Moderate bilateral effusions.  The heart is enlarged. Hepatobiliary: 7.6 x 5.4 x 4.5 cm heterogeneous mainly T2 hyperintense lesion identified in the medial right liver, tracking up from the region of the porta hepatis/gallbladder fossa. Relationship to the right portal vein not well demonstrated. Multiple stones noted in the gallbladder. No extrahepatic biliary duct dilatation. Pancreas: Numerous cystic lesions are noted in the pancreatic parenchyma measuring up to maximum size of approximately 1.2 cm. No dilatation of the main duct. Spleen:  No splenomegaly. No focal mass lesion. Adrenals/Urinary Tract: No adrenal nodule or mass. No hydronephrosis. Stomach/Bowel: No small bowel or colonic dilatation within the visualized abdomen. Vascular/Lymphatic: No abdominal aortic aneurysm. No abdominal lymphadenopathy. Other:  No substantial free fluid Musculoskeletal: No suspicious marrow signal abnormality. IMPRESSION: Markedly limited study due severe motion degradation, inability to administer intravenous contrast material, and patient being unable to complete the exam. Only 4 pulse sequences were obtained. 8 x 5 x 5 cm heterogeneous mainly T2 hyperintense lesion in the central liver. This cannot be further characterized given lack of intravenous contrast material and motion artifact. Tissue sampling may be warranted. Cholelithiasis. Electronically Signed   By: Misty Stanley M.D.   On:  09/15/2019 16:10   NM Hepato W/EjeCT Fract  Result Date: 09/14/2019 CLINICAL DATA:  Fever EXAM: NUCLEAR MEDICINE HEPATOBILIARY IMAGING TECHNIQUE: Sequential images of the abdomen were obtained out to 60 minutes following intravenous administration of radiopharmaceutical. RADIOPHARMACEUTICALS:  5.2 mCi Tc-21m  Choletec IV COMPARISON:  Ultrasound 09/21/2019 FINDINGS: Prompt uptake and biliary excretion of activity by the liver. Activity passes into the small bowel compatible with patent common bile duct. There is non filling of the gallbladder. Due to the patient's hypotension, morphine was not administered. Delayed imaging to 2 hours was performed with continued non filling of the gallbladder. IMPRESSION: Non filling of the gallbladder despite delayed imaging out to 2 hours. Findings concerning for cystic duct obstruction. Common bile duct is patent. Electronically Signed   By: Rolm Baptise M.D.   On: 09/14/2019 15:58  IR Perc Cholecystostomy  Result Date: 09/16/2019 CLINICAL DATA:  Recurrent calculus cholecystitis. Patient has had 2 previous cholecystostomy catheters. EXAM: PERCUTANEOUS CHOLECYSTOSTOMY TUBE PLACEMENT WITH ULTRASOUND AND FLUOROSCOPIC GUIDANCE FLUOROSCOPY TIME:  1.2 minute; 55 uGym2 DAP TECHNIQUE: The procedure, risks (including but not limited to bleeding, infection, organ damage ), benefits, and alternatives were explained to the patient and son. Questions regarding the procedure were encouraged and answered. The son understands and consents to the procedure. Survey ultrasound of the abdomen was performed and an appropriate skin entry site was identified. Skin site was marked, prepped with chlorhexidine, and draped in usual sterile fashion, and infiltrated locally with 1% lidocaine. Intravenous Fentanyl 66mcg and Versed 0.5mg  were administered as conscious sedation during continuous monitoring of the patient's level of consciousness and physiological / cardiorespiratory status by the  radiology RN, with a total moderate sedation time of 12 minutes. Under real-time ultrasound guidance, gallbladder was accessed using a transhepatic approach with a 21-gauge needle. Ultrasound image documentation was saved. Bile returned through the hub. Needle was exchanged over a 018 guidewire for transitional dilator which allowed placement of 035 J wire. Over this, a 10.2 French pigtail catheter was advanced and formed centrally in the gallbladder lumen. 10 mL of orange opaque fluid were aspirated, sent for Gram stain and culture. Small contrast injection confirmed appropriate position. Catheter secured externally with 0 Prolene suture and placed external drain bag. Patient tolerated the procedure well. COMPLICATIONS: COMPLICATIONS none IMPRESSION: 1. Technically successful percutaneous cholecystostomy tube placement with ultrasound and fluoroscopic guidance. Electronically Signed   By: Lucrezia Europe M.D.   On: 09/16/2019 13:53   DG Chest Port 1 View  Result Date: 09/07/2019 CLINICAL DATA:  Weakness EXAM: PORTABLE CHEST 1 VIEW COMPARISON:  09/04/2019 FINDINGS: Cardiomediastinal contours are stable accounting for portable technique and are partially obscured by graded opacity in the left and right chest. Signs of coronary stenting are noted along the left heart border. Increased interstitial markings are noted bilaterally without focal consolidation aside from obscuration of right hemidiaphragm. Spinal degenerative changes. Visualized skeletal structures otherwise unremarkable. IMPRESSION: 1. Probable new bilateral effusions and mild interstitial edema. Electronically Signed   By: Zetta Bills M.D.   On: 08/27/2019 15:33   DG Chest Port 1 View  Result Date: 09/04/2019 CLINICAL DATA:  85 year old male with chest pain and shortness of breath. EXAM: PORTABLE CHEST 1 VIEW COMPARISON:  Chest radiographs 02/28/2017 and earlier. FINDINGS: Portable AP upright view at 1437 hours. Lower lung volumes and kyphotic  positioning. Mediastinal contours appear stable and within normal limits. Visualized tracheal air column is within normal limits. No definite acute pulmonary opacity. There is mild asymmetric chronic scarring in the right upper lobe. No pleural effusion or pneumothorax. No acute osseous abnormality identified. IMPRESSION: No acute cardiopulmonary abnormality identified. Electronically Signed   By: Genevie Ann M.D.   On: 09/04/2019 15:02   ECHOCARDIOGRAM COMPLETE  Result Date: 09/05/2019   ECHOCARDIOGRAM REPORT   Patient Name:   SAHIR TOLSON Date of Exam: 09/05/2019 Medical Rec #:  [id number]    Height:       69.0 in Accession #:    [id number]   Weight:       147.6 lb Date of Birth:  March 05, 1928   BSA:          1.82 m Patient Age:    31 years     BP:           149/63 mmHg Patient Gender: M  HR:           94 bpm. Exam Location:  Inpatient Procedure: 2D Echo, Cardiac Doppler and Color Doppler Indications:    R07.9* Chest pain, unspecified  History:        Patient has prior history of Echocardiogram examinations, most                 recent 03/02/2017. Cardiomyopathy, CAD; Risk Factors:Hypertension                 and Diabetes. CKD. Heart Block. Cancer.  Sonographer:    Jonelle Sidle Dance Referring Phys: [place]  1. Left ventricular ejection fraction, by visual estimation, is 35 to 40%. The left ventricle has mildly decreased function. There is mildly increased left ventricular hypertrophy.  2. Elevated left atrial and left ventricular end-diastolic pressures.  3. Left ventricular diastolic parameters are consistent with Grade II diastolic dysfunction (pseudonormalization).  4. The left ventricle demonstrates global hypokinesis.  5. Global right ventricle has low normal systolic function.The right ventricular size is normal. No increase in right ventricular wall thickness.  6. Left atrial size was moderately dilated.  7. Right atrial size was normal.  8. The mitral valve is abnormal. Mild  mitral valve regurgitation.  9. The tricuspid valve is grossly normal. Tricuspid valve regurgitation is mild. 10. The aortic valve is tricuspid. Aortic valve regurgitation is trivial. Mild aortic valve sclerosis without stenosis. 11. The pulmonic valve was grossly normal. Pulmonic valve regurgitation is trivial. 12. Moderately elevated pulmonary artery systolic pressure. 13. The inferior vena cava is normal in size with <50% respiratory variability, suggesting right atrial pressure of 8 mmHg. 14. The interatrial septum appears to be lipomatous. FINDINGS  Left Ventricle: Left ventricular ejection fraction, by visual estimation, is 35 to 40%. The left ventricle has mildly decreased function. The left ventricle demonstrates global hypokinesis. There is mildly increased left ventricular hypertrophy. Left ventricular diastolic parameters are consistent with Grade II diastolic dysfunction (pseudonormalization). Elevated left atrial and left ventricular end-diastolic pressures. Right Ventricle: The right ventricular size is normal. No increase in right ventricular wall thickness. Global RV systolic function is has low normal systolic function. The tricuspid regurgitant velocity is 3.08 m/s, and with an assumed right atrial pressure of 8 mmHg, the estimated right ventricular systolic pressure is moderately elevated at 45.9 mmHg. Left Atrium: Left atrial size was moderately dilated. Right Atrium: Right atrial size was normal in size Pericardium: There is no evidence of pericardial effusion. Mitral Valve: The mitral valve is abnormal. There is mild thickening of the mitral valve leaflet(s). Mild mitral valve regurgitation. Tricuspid Valve: The tricuspid valve is grossly normal. Tricuspid valve regurgitation is mild. Aortic Valve: The aortic valve is tricuspid. Aortic valve regurgitation is trivial. Aortic regurgitation PHT measures 338 msec. Mild aortic valve sclerosis is present, with no evidence of aortic valve stenosis.  Pulmonic Valve: The pulmonic valve was grossly normal. Pulmonic valve regurgitation is trivial. Pulmonic regurgitation is trivial. Aorta: The aortic root and ascending aorta are structurally normal, with no evidence of dilitation. Venous: The inferior vena cava is normal in size with less than 50% respiratory variability, suggesting right atrial pressure of 8 mmHg. IAS/Shunts: Increased thickness of the atrial septum sparing the fossa ovalis consistent with The interatrial septum appears to be lipomatous. No atrial level shunt detected by color flow Doppler.  LEFT VENTRICLE PLAX 2D LVIDd:         5.56 cm  Diastology LVIDs:  4.29 cm  LV e' lateral:   9.68 cm/s LV PW:         1.13 cm  LV E/e' lateral: 11.0 LV IVS:        0.77 cm  LV e' medial:    7.94 cm/s LVOT diam:     2.00 cm  LV E/e' medial:  13.4 LV SV:         69 ml LV SV Index:   37.99 LVOT Area:     3.14 cm  RIGHT VENTRICLE             IVC RV Basal diam:  2.72 cm     IVC diam: 1.81 cm RV S prime:     10.60 cm/s TAPSE (M-mode): 1.1 cm LEFT ATRIUM             Index       RIGHT ATRIUM           Index LA diam:        4.60 cm 2.53 cm/m  RA Area:     20.50 cm LA Vol (A2C):   85.7 ml 47.20 ml/m RA Volume:   60.10 ml  33.10 ml/m LA Vol (A4C):   68.4 ml 37.67 ml/m LA Biplane Vol: 76.5 ml 42.13 ml/m  AORTIC VALVE LVOT Vmax:   74.60 cm/s LVOT Vmean:  46.450 cm/s LVOT VTI:    0.114 m AI PHT:      338 msec  AORTA Ao Root diam: 3.90 cm Ao Asc diam:  3.60 cm MITRAL VALVE                         TRICUSPID VALVE MV Area (PHT): 4.49 cm              TR Peak grad:   37.9 mmHg MV PHT:        49.01 msec            TR Vmax:        330.00 cm/s MV Decel Time: 169 msec MV E velocity: 106.00 cm/s 103 cm/s  SHUNTS MV A velocity: 56.30 cm/s  70.3 cm/s Systemic VTI:  0.11 m MV E/A ratio:  1.88        1.5       Systemic Diam: 2.00 cm  Lyman Bishop MD Electronically signed by Lyman Bishop MD Signature Date/Time: 09/05/2019/2:30:01 PM    Final    US Abdomen Limited  RUQ  Result Date: 09/21/2019 CLINICAL DATA:  Right upper quadrant pain EXAM: ULTRASOUND ABDOMEN LIMITED RIGHT UPPER QUADRANT COMPARISON:  CT same day FINDINGS: Gallbladder: There are multiple shadowing gallstones filling the gallbladder the largest measuring 1.8 cm. There is a diffusely thickened gallbladder wall measuring 6.3 mm. No sonographic Percell Miller sign is seen. Common bile duct: Diameter: 2 mm. Liver: There is a heterogeneous appearance to the liver parenchyma. The hypodense mass seen on CT same day is not well visualized on this exam. Portal vein is patent on color Doppler imaging with normal direction of blood flow towards the liver. Other: None. IMPRESSION: Cholelithiasis with diffuse gallbladder wall thickening which could be suggestive of acute cholecystitis. Heterogeneous liver parenchyma. The ill-defined mass on recent CT is not well visualized on this examination. Would recommend CT with contrast or MRI for further evaluation. Electronically Signed   By: Prudencio Pair M.D.   On: 09/05/2019 22:58     Time Spent in minutes  30     Satoya Feeley D Tadhg Eskew  M.D on 09/19/2019 at 9:52 AM  To page go to [email or web address] - password [facility]

## 2019-09-20 ENCOUNTER — Inpatient Hospital Stay (HOSPITAL_COMMUNITY): Payer: Medicare Other

## 2019-09-20 DIAGNOSIS — R6520 Severe sepsis without septic shock: Secondary | ICD-10-CM

## 2019-09-20 DIAGNOSIS — B955 Unspecified streptococcus as the cause of diseases classified elsewhere: Secondary | ICD-10-CM

## 2019-09-20 DIAGNOSIS — R7881 Bacteremia: Secondary | ICD-10-CM

## 2019-09-20 DIAGNOSIS — A419 Sepsis, unspecified organism: Secondary | ICD-10-CM

## 2019-09-20 LAB — BASIC METABOLIC PANEL
Anion gap: 11 (ref 5–15)
BUN: 39 mg/dL — ABNORMAL HIGH (ref 8–23)
CO2: 13 mmol/L — ABNORMAL LOW (ref 22–32)
Calcium: 8.5 mg/dL — ABNORMAL LOW (ref 8.9–10.3)
Chloride: 117 mmol/L — ABNORMAL HIGH (ref 98–111)
Creatinine, Ser: 2.51 mg/dL — ABNORMAL HIGH (ref 0.61–1.24)
GFR calc Af Amer: 25 mL/min — ABNORMAL LOW (ref >60)
GFR calc non Af Amer: 22 mL/min — ABNORMAL LOW (ref >60)
Glucose, Bld: 268 mg/dL — ABNORMAL HIGH (ref 70–99)
Potassium: 4.8 mmol/L (ref 3.5–5.1)
Sodium: 141 mmol/L (ref 135–145)

## 2019-09-20 LAB — COMPREHENSIVE METABOLIC PANEL
ALT: 23 U/L (ref 0–44)
AST: 32 U/L (ref 15–41)
Albumin: 2.1 g/dL — ABNORMAL LOW (ref 3.5–5.0)
Alkaline Phosphatase: 60 U/L (ref 38–126)
Anion gap: 9 (ref 5–15)
BUN: 35 mg/dL — ABNORMAL HIGH (ref 8–23)
CO2: 19 mmol/L — ABNORMAL LOW (ref 22–32)
Calcium: 8.5 mg/dL — ABNORMAL LOW (ref 8.9–10.3)
Chloride: 117 mmol/L — ABNORMAL HIGH (ref 98–111)
Creatinine, Ser: 2.36 mg/dL — ABNORMAL HIGH (ref 0.61–1.24)
GFR calc Af Amer: 27 mL/min — ABNORMAL LOW (ref >60)
GFR calc non Af Amer: 24 mL/min — ABNORMAL LOW (ref >60)
Glucose, Bld: 154 mg/dL — ABNORMAL HIGH (ref 70–99)
Potassium: 4.7 mmol/L (ref 3.5–5.1)
Sodium: 145 mmol/L (ref 135–145)
Total Bilirubin: 0.8 mg/dL (ref 0.3–1.2)
Total Protein: 5.3 g/dL — ABNORMAL LOW (ref 6.5–8.1)

## 2019-09-20 LAB — CBC
HCT: 26.5 % — ABNORMAL LOW (ref 39.0–52.0)
HCT: 32.2 % — ABNORMAL LOW (ref 39.0–52.0)
Hemoglobin: 8.9 g/dL — ABNORMAL LOW (ref 13.0–17.0)
Hemoglobin: 10.7 g/dL — ABNORMAL LOW (ref 13.0–17.0)
MCH: 35 pg — ABNORMAL HIGH (ref 26.0–34.0)
MCH: 34.9 pg — ABNORMAL HIGH (ref 26.0–34.0)
MCHC: 33.6 g/dL (ref 30.0–36.0)
MCHC: 33.2 g/dL (ref 30.0–36.0)
MCV: 104.3 fL — ABNORMAL HIGH (ref 80.0–100.0)
MCV: 104.9 fL — ABNORMAL HIGH (ref 80.0–100.0)
Platelets: 261 10*3/uL (ref 150–400)
Platelets: 269 10*3/uL (ref 150–400)
RBC: 2.54 MIL/uL — ABNORMAL LOW (ref 4.22–5.81)
RBC: 3.07 MIL/uL — ABNORMAL LOW (ref 4.22–5.81)
RDW: 23.7 % — ABNORMAL HIGH (ref 11.5–15.5)
RDW: 24.6 % — ABNORMAL HIGH (ref 11.5–15.5)
WBC: 31.2 10*3/uL — ABNORMAL HIGH (ref 4.0–10.5)
WBC: 39.2 10*3/uL — ABNORMAL HIGH (ref 4.0–10.5)
nRBC: 0.3 % — ABNORMAL HIGH (ref 0.0–0.2)
nRBC: 0.7 % — ABNORMAL HIGH (ref 0.0–0.2)

## 2019-09-20 LAB — BLOOD GAS, ARTERIAL
Acid-base deficit: 8.9 mmol/L — ABNORMAL HIGH (ref 0.0–2.0)
Bicarbonate: 16 mmol/L — ABNORMAL LOW (ref 20.0–28.0)
FIO2: 28
O2 Saturation: 98 %
Patient temperature: 36.6
pCO2 arterial: 31.1 mmHg — ABNORMAL LOW (ref 32.0–48.0)
pH, Arterial: 7.329 — ABNORMAL LOW (ref 7.350–7.450)
pO2, Arterial: 119 mmHg — ABNORMAL HIGH (ref 83.0–108.0)

## 2019-09-20 LAB — GLUCOSE, CAPILLARY
Glucose-Capillary: 129 mg/dL — ABNORMAL HIGH (ref 70–99)
Glucose-Capillary: 185 mg/dL — ABNORMAL HIGH (ref 70–99)
Glucose-Capillary: 244 mg/dL — ABNORMAL HIGH (ref 70–99)
Glucose-Capillary: 234 mg/dL — ABNORMAL HIGH (ref 70–99)
Glucose-Capillary: 200 mg/dL — ABNORMAL HIGH (ref 70–99)
Glucose-Capillary: 171 mg/dL — ABNORMAL HIGH (ref 70–99)

## 2019-09-20 LAB — APTT: aPTT: 43 seconds — ABNORMAL HIGH (ref 24–36)

## 2019-09-20 LAB — PROTIME-INR
INR: 1.6 — ABNORMAL HIGH (ref 0.8–1.2)
Prothrombin Time: 19.2 seconds — ABNORMAL HIGH (ref 11.4–15.2)

## 2019-09-20 LAB — LACTIC ACID, PLASMA
Lactic Acid, Venous: 3 mmol/L (ref 0.5–1.9)
Lactic Acid, Venous: 2.6 mmol/L (ref 0.5–1.9)
Lactic Acid, Venous: 2.2 mmol/L (ref 0.5–1.9)

## 2019-09-20 MED ORDER — FUROSEMIDE 10 MG/ML IJ SOLN
40.0000 mg | Freq: Once | INTRAMUSCULAR | Status: AC
  Administered 2019-09-20: 16:00:00 40 mg via INTRAVENOUS
  Filled 2019-09-20: qty 4

## 2019-09-20 MED ORDER — SODIUM CHLORIDE 0.9 % IV BOLUS
1000.0000 mL | Freq: Once | INTRAVENOUS | Status: AC
  Administered 2019-09-20: 18:00:00 1000 mL via INTRAVENOUS

## 2019-09-20 MED ORDER — ONDANSETRON HCL 4 MG/2ML IJ SOLN
4.0000 mg | Freq: Four times a day (QID) | INTRAMUSCULAR | Status: DC | PRN
  Administered 2019-09-20: 17:00:00 4 mg via INTRAVENOUS

## 2019-09-20 MED ORDER — ALBUMIN HUMAN 5 % IV SOLN: 12.5000 g | Freq: Once | INTRAVENOUS | Status: DC

## 2019-09-20 MED ORDER — SODIUM CHLORIDE 0.9 % IV BOLUS (SEPSIS): 1000.0000 mL | Freq: Once | INTRAVENOUS | Status: DC

## 2019-09-20 MED ORDER — VANCOMYCIN HCL IN DEXTROSE 1-5 GM/200ML-% IV SOLN: 1000.0000 mg | INTRAVENOUS | Status: DC

## 2019-09-20 MED ORDER — SODIUM CHLORIDE 0.9 % IV BOLUS (SEPSIS)
1000.0000 mL | Freq: Once | INTRAVENOUS | Status: AC
  Administered 2019-09-20: 17:00:00 1000 mL via INTRAVENOUS

## 2019-09-20 MED ORDER — ONDANSETRON HCL 4 MG/2ML IJ SOLN: 4.0000 mg | Freq: Four times a day (QID) | INTRAMUSCULAR | Status: DC

## 2019-09-20 MED ORDER — ONDANSETRON HCL 4 MG/2ML IJ SOLN
INTRAMUSCULAR | Status: AC
  Filled 2019-09-20: qty 2

## 2019-09-20 MED ORDER — SODIUM CHLORIDE 0.9 % IV BOLUS (SEPSIS): 250.0000 mL | Freq: Once | INTRAVENOUS | Status: DC

## 2019-09-20 MED ORDER — SODIUM CHLORIDE 0.9 % IV SOLN
2.0000 g | Freq: Once | INTRAVENOUS | Status: AC
  Administered 2019-09-20: 18:00:00 2 g via INTRAVENOUS
  Filled 2019-09-20: qty 2

## 2019-09-20 MED ORDER — VANCOMYCIN HCL 1500 MG/300ML IV SOLN
1500.0000 mg | Freq: Once | INTRAVENOUS | Status: AC
  Administered 2019-09-20: 19:00:00 1500 mg via INTRAVENOUS
  Filled 2019-09-20: qty 300

## 2019-09-20 MED ORDER — LACTATED RINGERS IV BOLUS
500.0000 mL | Freq: Once | INTRAVENOUS | Status: AC
  Administered 2019-09-20: 22:00:00 500 mL via INTRAVENOUS

## 2019-09-20 MED ORDER — SODIUM CHLORIDE 0.9 % IV SOLN
2.0000 g | INTRAVENOUS | Status: DC
  Administered 2019-09-21: 2 g via INTRAVENOUS
  Filled 2019-09-20: qty 2

## 2019-09-20 MED ORDER — METRONIDAZOLE IN NACL 5-0.79 MG/ML-% IV SOLN
500.0000 mg | Freq: Three times a day (TID) | INTRAVENOUS | Status: DC
  Administered 2019-09-20 – 2019-09-21 (×4): 500 mg via INTRAVENOUS
  Filled 2019-09-20 (×4): qty 100

## 2019-09-20 NOTE — Progress Notes (Addendum)
PROGRESS NOTE  Robert [name] [id number] DOB: [birth year] DOA: 08/29/2019 PCP: [name], MD  Brief History   85 year old man complex PMH below, status post percutaneous cholecystostomy removed April 2019 for cholecystitis, presented 12/20 with confusion, found to have fever, lactic acidosis.  CT consistent with right hepatic lobe mass concerning for malignancy and mild gallbladder wall thickening.  Admitted for acute cholecystitis.  Hospital course complicated by difficulty obtaining percutaneous access to a contracted gallbladder.  Not a surgical candidate per surgery.  Underwent IR guided cholecystostomy 12/23.  Hospital course complicated by bacteremia and acute metabolic encephalopathy.  A & P  Severe sepsis secondary to recurrent acute calculus cholecystitis, status post cholecystostomy placement 12/23.  HIDA scan showed obstructed cystic duct.  Poor surgical candidate. --Although afebrile, leukocytosis trending up and patient short of breath. --Intraoperative cultures growing Klebsiella and Streptococcus anginosis  --Continue ceftriaxone based on culture data --Continue drain care.  Will need repeat cholangiogram and drain exchange in 6 weeks. --repeat blood cultures, check lactic acid, chest x-ray, CBC, BMP, EKG  Streptococcus anginosus bacteremia  --Continue ceftriaxone --Repeat blood cultures --Need to rule out endocarditis.  Order transthoracic echocardiogram.  Right hepatic lobe mass concerning for malignancy.  Seen on MRI.  Dr. Lonny Prude discussed with son who wished to monitor at this time.  Combined chronic systolic and diastolic CHF, with acute CHF on admission --Home Lasix has been held secondary to soft blood pressure and euvolemia --Not hypoxic but breathing is labored.  No evidence of peripheral edema.  Will trial Lasix x1.  Check chest x-ray.  Paroxysmal atrial fibrillation, patient has declined anticoagulation in the past --Continue Plavix,  metoprolol  Acute metabolic encephalopathy likely secondary to infection.  Was encephalopathic on admission and combative.  CT head no acute disease.   --This has been documented intermittently throughout hospitalization.  CKD stage IV --Appears stable.  Microcytic anemia, chronic --Appears stable.  Diabetes mellitus type 2 on insulin --CBG stable.  Chart Review . 12/12 discharge atypical chest pain . 12/15 cardiology office new.  Noted to have SPO2 79% after walking up the stairs to the office.  Only has enough Xanax to take 1/day.    Chart reviewed in detail.  Patient appears ill this afternoon, although afebrile, without hypoxia and hemodynamically stable, he is tachypneic, restless and has some mottling of his thighs.  CBG stable.  EKG reviewed, nonacute.  His history is been marked by intermittent agitation and confusion in the hospital and he is mildly confused now although this appears to be not unusual.  Ate lunch earlier today and his exam is otherwise benign.  I am concerned about increasing leukocytosis.  Will require close monitoring and further data collection including ABG, chest x-ray, CBC, BMP, lactic acid.  Lasix x1.  I discussed with the son by telephone.  Patient is full code.  If patient does not improve for significant abnormalities are discovered, may require higher level of care.  Addendum.  Patient reevaluated frequently throughout the afternoon.  Mottling of the skin resolved.  Respiratory status improved with decreased work of breathing although he still tachypneic.  He no longer appears dyspneic.  Remains confused, denies pain.  Chest x-ray suggestive of edema although this does not correlate with physical exam.  I doubt pneumothorax.  Lactic acid was elevated and leukocytosis markedly worse.  CO2 down to 13.  SBP over 100 on last check at 1845.  SPO2 90% on room air.  Febrile up to 102.  Concern for developing sepsis, concern  intra-abdominal in nature-year-old consider  lung.  Will maintain CT chest abdomen pelvis.  Follow lactic acid.  I do not think he is volume overloaded on exam.  I discussed in detail with the patient's son at bedside.  Will discuss with my coverage for close monitoring and follow-up pending studies.  Critical care time 60 minutes  DVT prophylaxis: Heparin Code Status: Full per son [name] Family Communication: son [name] by telephone Disposition Plan: remain inpt    Murray Hodgkins, MD  [facility] Direct contact: see [email or web address] (further directions at bottom of note if needed) 7PM-7AM contact night coverage as at bottom of note 09/20/2019, 3:10 PM  LOS: 7 days   Significant Hospital Events   . 12/20 presented with confusion, leukocytosis, fever, admitted for severe sepsis secondary to recurrent cholecystitis, antibiotics, cholecystostomy placement . 12/21 agitated and confused . 12/23 percutaneous cholecystostomy . 12/25 agitated during shift . 12/25 palliative medicine consultation   Consults:  . General surgery . Interventional radiology . Palliative medicine   Procedures:  . 12/23 percutaneous cholecystostomy  Significant Diagnostic Tests:  . CT abdomen pelvis with a right hepatic lobe mass concerning for malignancy, bilateral pleural effusions, cholelithiasis with mild gallbladder wall thickening . Right upper quadrant ultrasound cholelithiasis with diffuse gallbladder wall thickening could suggest acute cholecystitis  . HIDA scan concerning for cystic duct obstruction. Marland Kitchen MRI liver motion degraded.  8 x 5 x 5 cm heterogeneous central liver lesion.   Micro Data:  . SARS Covid negative . 12/20 blood culture 1/2 Streptococcus anginosus . 12/26 bile culture Klebsiella oxytoca and Streptococcus anginosus, both sensitive to ceftriaxone   Antimicrobials:  . Cefepime 12/20 > 12/25 . Ceftriaxone 12/26 > . Metronidazole 12/20 > 12/26  Interval History/Subjective  RN reports patient became confused and short of breath  this afternoon after lunch, after working with PT seem to be short of breath and having labored breathing. Discussed with son, patient was "doing well" this morning. Reviewed BMT note: "On exam patient is awake and alert lying in bed in fetal position.  He states he is  "chilled" and appears to have rigors.  He states I'll be fine."  Patient denies pain.  He does endorse shortness of breath.   Objective   Vitals:  Vitals:   09/20/19 1440 09/20/19 1503  BP: (!) 163/110 (!) 141/78  Pulse: (!) 106 96  Resp:    Temp: (!) 97.4 F (36.3 C)   SpO2: 100%     Exam: Constitutional.  Appears anxious, restless, uncomfortable, ill Psychiatric.  Difficult to assess, he is hard of hearing, but states name clearly, son's name, location in the hospital.  Follows simple commands. Cardiovascular.  Irregular, normal rate.  No murmur, rub or gallop.  No lower extremity edema.  Telemetry shows atrial flutter with PVCs. Respiratory.  Clear to auscultation bilaterally.  No wheezes, rales or rhonchi.  Tachypneic, mild dyspnea noted, mild increased respiratory effort noted.  Speaks in short sentences. Abdomen.  Soft, nontender, nondistended.  Cholecystostomy drain in place. Skin.  Feet appear unremarkable.  There are some mottling of the skin over the knees extending up to the thighs.  Groin appears unremarkable.  Lower legs and feet appear unremarkable.  Feet are warm. Musculoskeletal.  Grossly normal tone and strength in all extremities. Neurologic.  Grossly nonfocal.  I have personally reviewed the following:   Today's Data   Basic metabolic panel notable for stable BUN 35 creatinine 2.36.  Sodium within normal limits.  CO2 slightly low at 19  but comparable to 12/24, 12/20  WBC continues to trend up, 31.2 > 39.2.  Hemoglobin stable at 10.7.  Platelets within normal limits.  Scheduled Meds: . clopidogrel  75 mg Oral Daily  . ferrous sulfate  325 mg Oral Q breakfast  . heparin injection (subcutaneous)   5,000 Units Subcutaneous Q8H  . insulin aspart  0-9 Units Subcutaneous TID WC  . insulin glargine  10 Units Subcutaneous q morning - 10a  . metoprolol succinate  12.5 mg Oral Daily  . senna-docusate  2 tablet Oral BID  . sodium chloride flush  5 mL Intracatheter Q8H   Continuous Infusions: . cefTRIAXone (ROCEPHIN)  IV Stopped (09/19/19 1743)    Principal Problem:   Acute cholecystitis Active Problems:   Type II diabetes mellitus with renal manifestations (HCC)   CKD (chronic kidney disease), stage IV (HCC)   Ischemic cardiomyopathy   Sepsis (HCC)   CHF exacerbation (HCC)   Hypernatremia   Acute metabolic encephalopathy   AF (paroxysmal atrial fibrillation) (HCC)   Liver mass   Altered mental status   Palliative care encounter   LOS: 7 days   How to contact the [facility] Attending or Consulting provider [place] or covering provider during after hours [place], for this patient?  1. Check the care team in [facility] and look for a) attending/consulting [facility] provider listed and b) the [facility] team listed 2. Log into [email or web address] and use [place] universal password to access. If you do not have the password, please contact the hospital operator. 3. Locate the [facility] provider you are looking for under [facility] and page to a number that you can be directly reached. 4. If you still have difficulty reaching the provider, please page the [facility] (Director on Call) for the Hospitalists listed on amion for assistance.

## 2019-09-20 NOTE — Progress Notes (Signed)
Sarajane Jews, MD paged regarding patient's increased work of breathing and increased respiration rate; Patient placed on 2L of O2 and placed in an upright position; Rapid Response [place] also paged; Patient placed on yellow mews; Awaiting orders from the physician.

## 2019-09-20 NOTE — Progress Notes (Signed)
Palliative follow up.  Per RN patient's sons do not want to have any further Palliative meetings.   I'm concerned that the son may have unrealistic expectations.  Patient's WBC count rising.    On exam patient is awake and alert lying in bed in fetal position.  He states he is  "chilled" and appears to have rigors.  He states I'll be fine.     Assessment:  Extremely pleasant elderly gentleman with multiple co-morbidities including advanced heart disease.  Currently with cholecystostomy tube in place for infection.  WBC rising.  Palliative will remain available to the medical team and if the son allows [place] to re-engage we would do so.  Please call our office if there is anything else we can do to assist in the care of this nice gentleman and his family.  Florentina Jenny, PA-C Palliative Medicine Office:  [phone]  15 min.

## 2019-09-20 NOTE — Significant Event (Signed)
Rapid Response Event Note  Overview: Arrival Time: 1530 Event Type: Respiratory  Initial Focused Assessment: Patient admitted with cholecystitis, per notes he is not a surgical candidate.  IR guided cholecystostomy drain 12/23. Bacteremia.  Currently on abx This afternoon he has increased RR and WOB. He is alert and Oriented x2 but is difficult to have a conversation with.   His legs are mottled Lung sounds decreased bases Heart tones irregular  BP   135/68  HR 107  RR 36  O2 sat 98% on 2L [place]  Oral temp 98.1, he does not feel warm to the touch.  Interventions: PCXR  40 mg lasix IV Labs drawn:  CBC, Bmet, Lactic Acid 12 lead EKG  1700:  Patient is less labored but still tachpnic.  Mottling has mostly resolved.  Dr Sarajane Jews is at bedside.  Lactic Acid 3.0 BP 96/46  HR 99  RR 36  O2 sat 97% He is hot to the touch.  Rectal temp 102.4  He is alert and able to answer a couple orientation questions but is mostly confused.  Tylenol given NS bolus initiated Abx given  BP 88/43  HR 90 2nd L NS bolus given 175 cc UOP documented (pt has a condom cath, foley bag emptied)  Pt transported to [place] of Care (if not transferred):  Event Summary: Name of Physician Notified: Dr Sarajane Jews at 1310    at    Outcome: Transferred (Comment)([facility])  Event End Time: [place]  Raliegh Ip

## 2019-09-20 NOTE — Progress Notes (Signed)
6N RN attempted report at approx 1805. After chart review, this RN called Rapid RN at 1810 given Rapid RN presence at bedside during attempted report. Informed by Rapid RN MD Sarajane Jews at bedside and can answer questions. RN spoke directly to [place] and inquired about patient status. Informed patient appropriate for transfer. RN inquired about plan of care; informed will give fluid bolus, albumin. RN inquired about goal MAP, informed MAP goal 65.   Received report from [employer] at [phone].   Approx. 1840 patient arrived to unit, not on O2. Placed on PC monitor and verified. Sarajane Jews arrive to bedside at [place].    Patient alert and oriented to self somewhat of situation, able to answer questions about son, no complaints of pain, requesting water.  Paged NP Baltazar Najjar at [phone].Marland KitchenBP 89/51 & 86/46, HR 91, RR in low 30's after 2L NS bolus.Vanc now infusing. A&Ox2.per Sarajane Jews MAP goal 65. Upon callback at [place] informed Baltazar Najjar of patient status, current VS and informed to have RT complete ABG. Night RN informed of NP directions.

## 2019-09-20 NOTE — Progress Notes (Signed)
Asked to f/up pt by attending.  S: Denies CP or SOB. C/o right sided abd pain near site of drain.  O: Chronically ill appearing elderly male in NAD. He appears pretty well, not at all toxic. Afebrile. BP 101 HR 80 RR 25. Sa)2 mid 90s on 2L. He is awake and alert. He mumbles some but eventually gets out the answers to questions. Skin is pale but no cyanosis. S1S2 with PVCs on tele. Occasional irreg on asculation. Lungs diminishes at right base, otherwise clear. MOE x 4 spontaneously. Neuro: non focal.  1. Hypoxia/tachypnea-much improved by previous description. ABG pH 7.3, PCO2 31, PO2 119, Bicarb 16. To have CT chest due to earlier findings. Slated for CT chest.   2. CHF-bilateral pleural effusions on CXR this pm. Can not use Lasix now due to soft BP. He had a one time dose of Lasix on admission. 3. Hypotension-improved somewhat when at bedside. May need Albumin. LA this afternoon was 3.0 and 2L NS given. Now, it is 2.6 and giving a 500cc bolus slowly. He has voided.  4. Cholecystis s/p drain-pain at site. Getting CT abd to r/o abscess. Presently waiting for pt to become a little more stable before CTs.  5. bactermia with intra-abdominal infection-on appropriate abx and Vanc added this pm.  KJKG, NP [facility] necessary for f/up with pt's acute findings and lab work. Physical exam repeated with altered tx plan.  30 mins-2000 to 2030.

## 2019-09-20 NOTE — Progress Notes (Signed)
RT obtained ABG on pt per order with the following results. RT will continue to monitor.   Results for Robert [name], Robert [name] (MRN [id number]) as of 09/20/2019 21:00  Ref. Range 09/20/2019 20:29  Sample type Unknown ARTERIAL  FIO2 Unknown 28.00  pH, Arterial Latest Ref Range: 7.350 - 7.450  7.329 (L)  pCO2 arterial Latest Ref Range: 32.0 - 48.0 mmHg 31.1 (L)  pO2, Arterial Latest Ref Range: 83.0 - 108.0 mmHg 119 (H)  Acid-base deficit Latest Ref Range: 0.0 - 2.0 mmol/L 8.9 (H)  Bicarbonate Latest Ref Range: 20.0 - 28.0 mmol/L 16.0 (L)  O2 Saturation Latest Units: % 98.0  Patient temperature Unknown 36.6  Collection site Unknown RIGHT RADIAL  Allens test (pass/fail) Latest Ref Range: PASS  PASS

## 2019-09-20 NOTE — Progress Notes (Signed)
Pharmacy Antibiotic Note  Robert [name] is a 85 y.o. male admitted on 09/10/2019 with intra-abdominal infection.  Pharmacy has been consulted for vancomycin and cefepime dosing.  WBC this afternoon increased to 39.2, febrile with temp at 101.1, Scr 2.51 (CrCl ~20 mL/min). LA 3. Having increased work of breathing. S/p cholecystostomy placement 12/23 with intraoperative cx growing streptococcus anginosis and klebsiella - being treated with ceftriaxone.    Plan: Cefepime 2 g IV every 24 hours  Vancomycin 1500 mg IV once then 1 g IV every 48 hours (estAUC 462) Monitor renal fx, cx results, clinical pic, and for vanc levels as appropriate  Weight: 158 lb 1.1 oz (71.7 kg)  Temp (24hrs), Avg:98 F (36.7 C), Min:97.4 F (36.3 C), Max:98.7 F (37.1 C)  Recent Labs  Lab 09/11/2019 1916 09/11/2019 2133 09/17/19 0403 09/18/19 0128 09/19/19 0258 09/20/19 0351 09/20/19 1603  WBC  --   --  16.2* 15.9* 26.8* 31.2* 39.2*  CREATININE  --   --  2.02* 2.35* 2.34* 2.36* 2.51*  LATICACIDVEN 2.1* 1.8  --   --   --   --  3.0*    Estimated Creatinine Clearance: 20 mL/min (A) (by C-G formula based on SCr of 2.51 mg/dL (H)).    No Known Allergies  Antimicrobials this admission: Cefepime 12/20 >> 12/25, 12/27>> Metronidazole12/20 >>  Ceftriaxone 12/26>>12/27  Dose adjustments this admission: N/A  Microbiology results: 12/20 BCx: 1/4 strep anginosis 12/23 Wound Cx: klebsiella oxytoca (R amp), strep anginosis (pan-S)  12/27 BCx: sent   Thank you for allowing pharmacy to be a part of this patient's care.  Antonietta Jewel, PharmD, BCCCP Clinical Pharmacist  Phone: [phone]  Please check AMION for all [place] phone numbers After 10:00 PM, call [place] [phone] 09/20/2019 5:33 PM

## 2019-09-20 NOTE — Progress Notes (Addendum)
Attempt made to call report to [place]. Stated she would call back in 5 minutes; Direct line of this RN given.   Report given to [facility] 1814.

## 2019-09-21 ENCOUNTER — Inpatient Hospital Stay (HOSPITAL_COMMUNITY): Payer: Medicare Other

## 2019-09-21 DIAGNOSIS — I361 Nonrheumatic tricuspid (valve) insufficiency: Secondary | ICD-10-CM

## 2019-09-21 DIAGNOSIS — I340 Nonrheumatic mitral (valve) insufficiency: Secondary | ICD-10-CM

## 2019-09-21 LAB — CBC
HCT: 27.2 % — ABNORMAL LOW (ref 39.0–52.0)
HCT: 25.4 % — ABNORMAL LOW (ref 39.0–52.0)
Hemoglobin: 8.9 g/dL — ABNORMAL LOW (ref 13.0–17.0)
Hemoglobin: 8 g/dL — ABNORMAL LOW (ref 13.0–17.0)
MCH: 34.6 pg — ABNORMAL HIGH (ref 26.0–34.0)
MCH: 35.6 pg — ABNORMAL HIGH (ref 26.0–34.0)
MCHC: 32.7 g/dL (ref 30.0–36.0)
MCHC: 31.5 g/dL (ref 30.0–36.0)
MCV: 105.8 fL — ABNORMAL HIGH (ref 80.0–100.0)
MCV: 112.9 fL — ABNORMAL HIGH (ref 80.0–100.0)
Platelets: 232 10*3/uL (ref 150–400)
Platelets: 178 10*3/uL (ref 150–400)
RBC: 2.57 MIL/uL — ABNORMAL LOW (ref 4.22–5.81)
RBC: 2.25 MIL/uL — ABNORMAL LOW (ref 4.22–5.81)
RDW: 24.2 % — ABNORMAL HIGH (ref 11.5–15.5)
RDW: 25.1 % — ABNORMAL HIGH (ref 11.5–15.5)
WBC: 41.7 10*3/uL — ABNORMAL HIGH (ref 4.0–10.5)
WBC: 68.4 10*3/uL (ref 4.0–10.5)
nRBC: 0.2 % (ref 0.0–0.2)
nRBC: 1.9 % — ABNORMAL HIGH (ref 0.0–0.2)

## 2019-09-21 LAB — POCT I-STAT EG7
Acid-base deficit: 19 mmol/L — ABNORMAL HIGH (ref 0.0–2.0)
Bicarbonate: 12.8 mmol/L — ABNORMAL LOW (ref 20.0–28.0)
Calcium, Ion: 1.2 mmol/L (ref 1.15–1.40)
HCT: 27 % — ABNORMAL LOW (ref 39.0–52.0)
Hemoglobin: 9.2 g/dL — ABNORMAL LOW (ref 13.0–17.0)
O2 Saturation: 23 %
Potassium: 4 mmol/L (ref 3.5–5.1)
Sodium: 149 mmol/L — ABNORMAL HIGH (ref 135–145)
TCO2: 15 mmol/L — ABNORMAL LOW (ref 22–32)
pCO2, Ven: 62.2 mmHg — ABNORMAL HIGH (ref 44.0–60.0)
pH, Ven: 6.922 — CL (ref 7.250–7.430)
pO2, Ven: 27 mmHg — CL (ref 32.0–45.0)

## 2019-09-21 LAB — GLUCOSE, CAPILLARY
Glucose-Capillary: 143 mg/dL — ABNORMAL HIGH (ref 70–99)
Glucose-Capillary: 163 mg/dL — ABNORMAL HIGH (ref 70–99)
Glucose-Capillary: 127 mg/dL — ABNORMAL HIGH (ref 70–99)
Glucose-Capillary: 110 mg/dL — ABNORMAL HIGH (ref 70–99)
Glucose-Capillary: 110 mg/dL — ABNORMAL HIGH (ref 70–99)

## 2019-09-21 LAB — COMPREHENSIVE METABOLIC PANEL
ALT: 20 U/L (ref 0–44)
AST: 25 U/L (ref 15–41)
Albumin: 2.1 g/dL — ABNORMAL LOW (ref 3.5–5.0)
Alkaline Phosphatase: 72 U/L (ref 38–126)
Anion gap: 13 (ref 5–15)
BUN: 41 mg/dL — ABNORMAL HIGH (ref 8–23)
CO2: 15 mmol/L — ABNORMAL LOW (ref 22–32)
Calcium: 8.1 mg/dL — ABNORMAL LOW (ref 8.9–10.3)
Chloride: 116 mmol/L — ABNORMAL HIGH (ref 98–111)
Creatinine, Ser: 2.74 mg/dL — ABNORMAL HIGH (ref 0.61–1.24)
GFR calc Af Amer: 23 mL/min — ABNORMAL LOW (ref >60)
GFR calc non Af Amer: 20 mL/min — ABNORMAL LOW (ref >60)
Glucose, Bld: 178 mg/dL — ABNORMAL HIGH (ref 70–99)
Potassium: 4.8 mmol/L (ref 3.5–5.1)
Sodium: 144 mmol/L (ref 135–145)
Total Bilirubin: 0.4 mg/dL (ref 0.3–1.2)
Total Protein: 5.4 g/dL — ABNORMAL LOW (ref 6.5–8.1)

## 2019-09-21 LAB — AEROBIC/ANAEROBIC CULTURE W GRAM STAIN (SURGICAL/DEEP WOUND)

## 2019-09-21 LAB — ECHOCARDIOGRAM COMPLETE: Weight: 2816 oz

## 2019-09-21 MED ORDER — SODIUM CHLORIDE 0.9 % IV SOLN: INTRAVENOUS | Status: DC

## 2019-09-21 MED ORDER — EPINEPHRINE HCL 5 MG/250ML IV SOLN IN NS
0.5000 ug/min | INTRAVENOUS | Status: DC
  Administered 2019-09-21: 0.5 ug/min via INTRAVENOUS
  Filled 2019-09-21: qty 250

## 2019-09-21 MED ORDER — NOREPINEPHRINE 4 MG/250ML-% IV SOLN
0.0000 ug/min | INTRAVENOUS | Status: DC
  Administered 2019-09-21: 10 ug/min via INTRAVENOUS

## 2019-09-21 MED ORDER — SODIUM BICARBONATE-DEXTROSE 150-5 MEQ/L-% IV SOLN
150.0000 meq | INTRAVENOUS | Status: DC
  Administered 2019-09-22: 150 meq via INTRAVENOUS
  Filled 2019-09-21: qty 1000

## 2019-09-21 MED ORDER — SODIUM BICARBONATE 8.4 % IV SOLN
INTRAVENOUS | Status: AC
  Filled 2019-09-21: qty 50

## 2019-09-21 MED ORDER — NOREPINEPHRINE 4 MG/250ML-% IV SOLN: 0.0000 ug/min | INTRAVENOUS | Status: DC

## 2019-09-21 NOTE — Progress Notes (Signed)
PROGRESS NOTE  [employer] PRF:[id number] DOB: 04-Aug-1928 DOA: 09/06/2019 PCP: [name], MD  Brief History   85 year old man complex PMH below, status post percutaneous cholecystostomy removed April 2019 for cholecystitis, presented 12/20 with confusion, found to have fever, lactic acidosis.  CT consistent with right hepatic lobe mass concerning for malignancy and mild gallbladder wall thickening.  Admitted for acute cholecystitis.  Hospital course complicated by difficulty obtaining percutaneous access to a contracted gallbladder.  Not a surgical candidate per surgery.  Underwent IR guided cholecystostomy 12/23.  Hospital course complicated by bacteremia and acute metabolic encephalopathy.  A & P  Severe sepsis secondary to recurrent acute calculus cholecystitis, status post cholecystostomy placement 12/23.  HIDA scan showed obstructed cystic duct.  Poor surgical candidate. --12/27 the patient developed fever, tachypnea and some mottling and was transferred to progressive care. --12/28 he appears much better although leukocytosis is progressive, lactic acid trending down, he has had no further fever and clinically he appears much better. --CT chest abdomen pelvis was held last evening to ensure patient stability.  He appears stable to proceed with the studies today. --Etiology of decompensation yesterday unclear, I am suspicious of further bacteremia and plan evaluation with CT abdomen pelvis chest and echocardiogram. --Continue to follow blood culture data.  Streptococcus anginosus bacteremia  --Repeat blood cultures pending.  For now continue antibiotics.  --Discussed with infectious disease Dr. Johnnye Sima, need to rule out endocarditis.  Ordered transthoracic echocardiogram.  Right hepatic lobe mass concerning for malignancy.  Seen on MRI.  Dr. Lonny Prude discussed with son who wished to monitor at this time.  Combined chronic systolic and diastolic CHF, with acute CHF on  admission --Despite chest x-ray findings, clinically he appears at most euvolemic.  There is no volume overload and no significant rails.  His respiratory status appears improved over yesterday. --Continue IV fluids.  Hold Lasix.  Follow-up.  Paroxysmal atrial fibrillation, patient has declined anticoagulation in the past --Appears stable.  Rate controlled.  Continue Plavix, will hold a.m. metoprolol given borderline low blood pressure.  Acute metabolic encephalopathy likely secondary to infection.  Was encephalopathic on admission and combative.  CT head no acute disease.   --Currently this appears resolved.  CKD stage IV --Possible component of AKI.  Slightly above baseline.  I suspect he needs more fluids.  Continue fluids.  No evidence of volume overload.  BMP in a.m.  Microcytic anemia, chronic --Remained stable, continue present management.  Diabetes mellitus type 2 on insulin --CBG remains stable.  Continue present management.  DVT prophylaxis: Heparin SQ Code Status: Full  Family Communication: will update son Disposition Plan: remain inpt    Robert Hodgkins, MD  [facility] Direct contact: see [email or web address] (further directions at bottom of note if needed) 7PM-7AM contact night coverage as at bottom of note 09/21/2019, 10:25 AM  LOS: 8 days   Significant Hospital Events   . 12/20 presented with confusion, leukocytosis, fever, admitted for severe sepsis secondary to recurrent cholecystitis, antibiotics, cholecystostomy placement . 12/21 agitated and confused . 12/23 percutaneous cholecystostomy . 12/25 agitated during shift . 12/25 palliative medicine consultation . 12/27 became febrile, tachypneic, WBC increasing, transferred to progressive care   Consults:  . General surgery . Interventional radiology . Palliative medicine   Procedures:  . 12/23 percutaneous cholecystostomy  Significant Diagnostic Tests:  . CT abdomen pelvis with a right hepatic lobe mass  concerning for malignancy, bilateral pleural effusions, cholelithiasis with mild gallbladder wall thickening . Right upper quadrant ultrasound cholelithiasis with diffuse  gallbladder wall thickening could suggest acute cholecystitis  . HIDA scan concerning for cystic duct obstruction. Marland Kitchen MRI liver motion degraded.  8 x 5 x 5 cm heterogeneous central liver lesion.   Micro Data:  . SARS Covid negative . 12/20 blood culture 1/2 Streptococcus anginosus . 12/26 bile culture Klebsiella oxytoca and Streptococcus anginosus, both sensitive to ceftriaxone   Antimicrobials:  . Cefepime 12/20 > 12/25; 12/27 > . Ceftriaxone 12/26 > . Metronidazole 12/20 > 12/26; 12/27> . Vancomycin 12/27 >  Interval History/Subjective  Some hypotension overnight but otherwise did well. Urine output poor Discussed with bedside RN, no other issues reported Today the patient reports feeling better, he has no complaints  Objective   Vitals:  Vitals:   09/21/19 0900 09/21/19 0929  BP: (!) 104/48   Pulse: 79 64  Resp: (!) 27   Temp:  97.9 F (36.6 C)  SpO2: 100% 100%    Exam: Constitutional.  Appears much better today, calm, comfortable, no distress ENT.  Hard of hearing. Respiratory.  Clear to auscultation bilaterally.,  Diminished breath sounds both bases.  No significant rhonchi, wheezes or rales. Cardiovascular.  Irregular, normal rhythm.  No murmur, rub or gallop.  No lower extremity edema.  Telemetry atrial fibrillation, ventricular rate in the 70s. Abdomen.  Drain in place.  Abdomen soft, nontender nondistended. Skin.  Back, abdomen, arms, legs appear unremarkable.  There is no mottling. Psychiatric.  Grossly normal mood and affect.  Speech fluent and appropriate. Neurologic.  Grossly nonfocal.  I have personally reviewed the following:   Today's Data    CBG remains stable ABG 7.3 3/31/119 Lactic acid trending down 2.6, 2.2 Slightly elevated at 41, creatinine slightly elevated 2.74, CO2  improved at 15, LFTs unremarkable WBC continues to increase, 41.7, hemoglobin stable at 8.9, platelets within normal limits  Scheduled Meds: . clopidogrel  75 mg Oral Daily  . ferrous sulfate  325 mg Oral Q breakfast  . heparin injection (subcutaneous)  5,000 Units Subcutaneous Q8H  . insulin aspart  0-9 Units Subcutaneous TID WC  . insulin glargine  10 Units Subcutaneous q morning - 10a  . metoprolol succinate  12.5 mg Oral Daily  . senna-docusate  2 tablet Oral BID  . sodium chloride flush  5 mL Intracatheter Q8H   Continuous Infusions: . ceFEPime (MAXIPIME) IV    . metronidazole 500 mg (09/21/19 0902)  . [START ON 2019/10/15] vancomycin      Principal Problem:   Severe sepsis (HCC) Active Problems:   Type II diabetes mellitus with renal manifestations (HCC)   CKD (chronic kidney disease), stage IV (HCC)   Acute cholecystitis   Ischemic cardiomyopathy   Sepsis (HCC)   Hypernatremia   Acute metabolic encephalopathy   AF (paroxysmal atrial fibrillation) (HCC)   Liver mass   Altered mental status   Palliative care encounter   Streptococcal bacteremia   LOS: 8 days   How to contact the [facility] Attending or Consulting provider [place] or covering provider during after hours [place], for this patient?  1. Check the care team in [facility] and look for a) attending/consulting TRH provider listed and b) the [facility] team listed 2. Log into [email or web address] and use [place]'s universal password to access. If you do not have the password, please contact the hospital operator. 3. Locate the [facility] provider you are looking for under [facility] and page to a number that you can be directly reached. 4. If you still have difficulty reaching the provider,  please page the [facility] (Director on Call) for the Hospitalists listed on amion for assistance.

## 2019-09-21 NOTE — Progress Notes (Signed)
   09/21/19 2200  Clinical Encounter Type  Visited With Health care provider;Patient not available  Visit Type Initial;Code  Referral From Other (Comment) (code blue)   Present at code, no family present, I gave all contact #s for son to [facility], who passed them on to resident MD.  Once pt transferred to 59M, resident MD called son, who did not seem to need add'l support at this time.  If that changes, pls page chaplain.  Temple Pacini [place], [phone]

## 2019-09-21 NOTE — Consult Note (Signed)
   [facility] CM Inpatient Consult   09/21/2019  Juandedios Dudash 1928-07-21 [id number]     Patient screened for extreme high risk score for unplanned readmission score of 30% with less than 30 days for re-hospitalizations and with long length of stay.    Review of patient's medical record of MD  progress notes, palliative care consult notes, and Transition of Care [TOC] team notes reveals patient is from home with son, [name].   Plan:  Follow up with inpatient [facility] team for progression and disposition and potential [facility] Care Management needs.   Continue to follow progress and disposition to assess for post hospital care management needs.    Please place a [facility] Care Management consult as appropriate and for questions contact:   Natividad Brood, RN BSN [place] [facility] Liaison  [phone] business mobile phone Toll free office [phone]  Fax number: [phone] [email or web address]

## 2019-09-21 NOTE — Progress Notes (Addendum)
[facility] Progress Note Patient Name: Robert [name] DOB: Jan 17, 1928 MRN: [id number]   Date of Service  09/21/2019  HPI/Events of Note  Pt went into asystolic cardiac arrest.  eICU Interventions  ACLS protocol initiated, in addition to standard ACLS protocol Pt received 2  Amps of sodium bicarbonate, 1 amp of calcium chloride, Sodium bicarbonate infusion started for  PH of 6.9, Epinephrine and Norepinephrine infusions started with ROSC. Arterial line ordered. Family was called during the code and advised of his precarious situation, they are on their way in. RR increased to 24  to address respiratory component to his acidosis.        Robert [name] 09/21/2019, 11:42 PM

## 2019-09-21 NOTE — Code Documentation (Signed)
  Patient Name: Robert [name]   MRN: [id number]   Date of Birth/ Sex: 1928-05-19 , male      Admission Date: 09/17/2019  Attending Provider: Samuella Cota, MD  Primary Diagnosis: Severe sepsis [facility])   Indication: Pt was in his usual state of health until this PM, when he was noted to be asystole. Code blue was subsequently called. At the time of arrival on scene, ACLS protocol was underway.   Technical Description:  - CPR performance duration:  9 minutes  - Was defibrillation or cardioversion used? No   - Was external pacer placed? No  - Was patient intubated pre/post CPR? Yes   Medications Administered: Y = Yes; Blank = No Amiodarone    Atropine    Calcium  x  Epinephrine  x  Lidocaine    Magnesium    Norepinephrine    Phenylephrine    Sodium bicarbonate  x  Vasopressin     Post CPR evaluation:  - Final Status - Was patient successfully resuscitated ? Yes - What is current rhythm? Atrial fibrillation - What is current hemodynamic status? unstable  Miscellaneous Information:  - Labs sent, including: Lactic acid  - Primary team notified?  Yes  - Family Notified? Yes  - Additional notes/ transfer status: In ICU      Samar Venneman A, DO  09/21/2019, 11:55 PM

## 2019-09-21 NOTE — Progress Notes (Signed)
Echocardiogram 2D Echocardiogram has been performed.  Oneal Deputy Shoshana Johal 09/21/2019, 11:44 AM

## 2019-09-21 NOTE — Procedures (Signed)
Intubation Procedure Note Robert [name] [id number] Dec 07, 1927  Procedure: Intubation Indications: Airway protection and maintenance  Procedure Details Consent: Unable to obtain consent because of emergent medical necessity. Time Out: Verified patient identification, verified procedure, site/side was marked, verified correct patient position, special equipment/implants available, medications/allergies/relevent history reviewed, required imaging and test results available.  Performed 2206  Maximum sterile technique was used including gloves and hand hygiene.  MAC and 4    Evaluation Hemodynamic Status: BP stable throughout; O2 sats: currently acceptable Patient's Current Condition: stable Complications: No apparent complications Patient did tolerate procedure well. Chest X-ray ordered to verify placement.  CXR: pending.   Milinda Cave 09/21/2019

## 2019-09-21 NOTE — Progress Notes (Signed)
Patient is a 85 yo male with history of failure to thrive, sp second gallbladder drain placed for chronic recurrent bouts of pancreatitis who was found asystolic on the floor shortly after admission.  Received CPR and had return of bradycardic rhythm which shortly degenerated to PEA.  Received multiple rounds of bicarb and epi with return of normal rhythm and and pulse of 120/113.   Labs drawn and CXR pending.  Over 35 minutes spent in resuscitative efforts and chart review.  Add:  ET tube advanced after CXR obtained.

## 2019-09-21 NOTE — Progress Notes (Signed)
   09/21/19 2300  Clinical Encounter Type  Visited With Family;Health care provider;Patient not available  Visit Type Follow-up;Code;Social support;Psychological support  Referral From Other (Comment) (2nd code blue)  Spiritual Encounters  Spiritual Needs Emotional  Stress Factors  Patient Stress Factors Not reviewed  Family Stress Factors Major life changes   Present when pt coded 2nd time.  Spoke w/ NP Eddie Dibbles, MD, and [facility].  Called NOK son after he was called by NP Eddie Dibbles re 2nd code.  Let son know that up to 4 ppl total may come, per ok by above named persons.  Also, MD let son know tenuous state of pt.  Per son, he lives in [place], [place], and will be about 56mins, and his brother lives in [place]. Raynelle Fanning and had just been called.    Pls pg chaplain when family arrives; security given heads-up.  Temple Pacini [place], [phone]

## 2019-09-21 NOTE — Progress Notes (Signed)
CCMD called-  Reported Pt O2 sats dropped while in CT.  Called down to check on patient- CT reported patient is on the way back up

## 2019-09-21 NOTE — Code Documentation (Signed)
  Patient Name: Robert [name]   MRN: [id number]   Date of Birth/ Sex: [birth year] , male      Admission Date: 09/18/2019  Attending Provider: Samuella Cota, MD  Primary Diagnosis: Severe sepsis [facility])   Indication: Pt was in his usual state of health until this AM, when he was noted to be asystole. Code blue was subsequently called. At the time of arrival on scene, ACLS protocol was underway.   Technical Description:  - CPR performance duration:  14 minutes  - Was defibrillation or cardioversion used? No   - Was external pacer placed? No  - Was patient intubated pre/post CPR? Yes   Medications Administered: Y = Yes; Blank = No Amiodarone    Atropine    Calcium    Epinephrine  x  Lidocaine    Magnesium    Norepinephrine    Phenylephrine    Sodium bicarbonate  x  Vasopressin     Post CPR evaluation:  - Final Status - Was patient successfully resuscitated ? Yes - What is current rhythm? Atrial fibrillation - What is current hemodynamic status? stable  Miscellaneous Information:  - Labs sent, including: Troponin, CBC, CMP  - Primary team notified?  Yes  - Family Notified? Yes  - Additional notes/ transfer status: ICU     Shondale Quinley A, DO  09/21/2019, 10:40 PM

## 2019-09-21 NOTE — Progress Notes (Addendum)
This RN stepped into hallway from another patient room and noticed patient rhythm in asystole.  As RN was walking into patient room, CCMD notified of asystole.  No pulse present. Code Blue called and CPR initiated.  Pt transferred to 2M05.  Rapid Response RN stated that she would have MD call patient son.

## 2019-09-22 LAB — MRSA PCR SCREENING: MRSA by PCR: NEGATIVE

## 2019-09-22 LAB — COMPREHENSIVE METABOLIC PANEL
ALT: 57 U/L — ABNORMAL HIGH (ref 0–44)
AST: 185 U/L — ABNORMAL HIGH (ref 15–41)
Albumin: 1.6 g/dL — ABNORMAL LOW (ref 3.5–5.0)
Alkaline Phosphatase: 307 U/L — ABNORMAL HIGH (ref 38–126)
Anion gap: 16 — ABNORMAL HIGH (ref 5–15)
BUN: 45 mg/dL — ABNORMAL HIGH (ref 8–23)
CO2: 13 mmol/L — ABNORMAL LOW (ref 22–32)
Calcium: 7.8 mg/dL — ABNORMAL LOW (ref 8.9–10.3)
Chloride: 120 mmol/L — ABNORMAL HIGH (ref 98–111)
Creatinine, Ser: 3.17 mg/dL — ABNORMAL HIGH (ref 0.61–1.24)
GFR calc Af Amer: 19 mL/min — ABNORMAL LOW (ref >60)
GFR calc non Af Amer: 16 mL/min — ABNORMAL LOW (ref >60)
Glucose, Bld: 152 mg/dL — ABNORMAL HIGH (ref 70–99)
Potassium: 5.3 mmol/L — ABNORMAL HIGH (ref 3.5–5.1)
Sodium: 149 mmol/L — ABNORMAL HIGH (ref 135–145)
Total Bilirubin: 0.6 mg/dL (ref 0.3–1.2)
Total Protein: 4.1 g/dL — ABNORMAL LOW (ref 6.5–8.1)

## 2019-09-22 LAB — TROPONIN I (HIGH SENSITIVITY): Troponin I (High Sensitivity): 107 ng/L (ref <18)

## 2019-09-22 MED FILL — Medication: Qty: 1 | Status: AC

## 2019-09-25 LAB — CULTURE, BLOOD (ROUTINE X 2)
Culture: NO GROWTH
Culture: NO GROWTH
Culture: NO GROWTH
Culture: NO GROWTH
Special Requests: ADEQUATE
Special Requests: ADEQUATE

## 2019-09-25 NOTE — Telephone Encounter (Signed)
The patient passed away this AM at [place].

## 2019-09-25 NOTE — Progress Notes (Signed)
   09-30-19 0800  Clinical Encounter Type  Visited With Patient;Health care provider;Family  Visit Type Follow-up;Psychological support;Social support;Patient actively dying;Code  Spiritual Encounters  Spiritual Needs Emotional  Stress Factors  Patient Stress Factors Not reviewed  Family Stress Factors Major life changes;Loss;Loss of control   Present at 3rd code, brought pt son up when he arrived.  Empathetic presence and brief review of last 5 years.  Day before was 5 year anniversary of pt's wife's death.  With son and other son on speaker phone when drs spoke with them.  Grief support.  Temple Pacini [place], [phone]

## 2019-09-25 NOTE — Progress Notes (Signed)
Patient came to 74M around 2200 as a post code. He coded basically his entire stay on the unit until family made him a DNR. Time of death 60. The patient's son came to the hospital but did not wish to come back and see him. The funeral home of choice is [employer] in [place]. I have attempted to call the son multiple times regarding the patient's death, but have been unsuccessful in reaching him.   Gabriel Rainwater, RN

## 2019-09-25 NOTE — Telephone Encounter (Signed)
See other telephone note from same day. Dr. [name] answered his sons question.

## 2019-09-25 NOTE — Discharge Summary (Addendum)
Death Summary  Robert [name] GYI:[id number] DOB: Feb 19, 1928 DOA: October 10, 2019  PCP: [name], MD   Admit date: 2019-10-10 Date of Death: October 19, 2023  Final Diagnoses:  1. Severe sepsis secondary to bacteremia as below secondary to recurrent acute calculus cholecystitis ([facility]) 2. Streptococcus anginosus bacteremia 3. Right hepatic lobe mass concerning for malignancy 4. Acute on chronic combined systolic and diastolic CHF 5. Paroxysmal atrial fibrillation 6. Acute metabolic encephalopathy secondary to infection 7. CKD stage IV 8. Microcytic anemia 9. Diabetes mellitus type 2  History of present illness:  86 year old man complex PMH below, status post percutaneous cholecystostomy removed April 2019 for cholecystitis, presented October 10, 2023 with confusion, found to have fever, lactic acidosis.  CT consistent with right hepatic lobe mass concerning for malignancy and mild gallbladder wall thickening.  Admitted for acute cholecystitis.    Hospital Course:  Hospital course complicated by difficulty obtaining percutaneous access to a contracted gallbladder.  Not a surgical candidate per surgery.  Underwent IR guided cholecystostomy 12/23.  Hospital course further complicated by bacteremia and acute metabolic encephalopathy.  12/27 the patient developed fever, tachypnea and some mottling of the skin was transferred to progressive care, antibiotics were broadened and extensive laboratory and imaging investigation was undertaken.  The following day he appeared better although he continued to have progressive leukocytosis.  CT chest abdomen and pelvis did not reveal any acute abnormalities to explain his leukocytosis, sepsis or worsening condition.  He continued on broad-spectrum antibiotics for sepsis, bacteremia.  Unfortunately despite aggressive therapy with antibiotics and attention to his comorbidities, he passed away early 10/19/23.   Time: 15 minutes  Signed:  Murray Hodgkins, MD  [facility] 09/24/2019, 5:53 PM

## 2019-09-25 DEATH — deceased

## 2019-10-06 ENCOUNTER — Ambulatory Visit: Payer: Medicare Other | Admitting: Interventional Cardiology

## 2019-11-03 ENCOUNTER — Ambulatory Visit: Payer: Medicare Other | Admitting: Endocrinology

## 2019-12-24 NOTE — Telephone Encounter (Signed)
CANCELLED ORDER

## 2020-07-26 ENCOUNTER — Encounter (INDEPENDENT_AMBULATORY_CARE_PROVIDER_SITE_OTHER): Payer: Medicare Other | Admitting: Ophthalmology
